# Patient Record
Sex: Female | Born: 1960 | Race: Black or African American | Hispanic: No | Marital: Single | Smoking: Former smoker
Health system: Southern US, Community
[De-identification: ages and names within clinical notes are randomized; demographics above are authoritative.]

## PROBLEM LIST (undated history)

## (undated) DIAGNOSIS — J3089 Other allergic rhinitis: Secondary | ICD-10-CM

## (undated) DIAGNOSIS — Z8619 Personal history of other infectious and parasitic diseases: Secondary | ICD-10-CM

## (undated) DIAGNOSIS — E119 Type 2 diabetes mellitus without complications: Secondary | ICD-10-CM

## (undated) DIAGNOSIS — B182 Chronic viral hepatitis C: Secondary | ICD-10-CM

## (undated) DIAGNOSIS — E785 Hyperlipidemia, unspecified: Secondary | ICD-10-CM

## (undated) DIAGNOSIS — I152 Hypertension secondary to endocrine disorders: Secondary | ICD-10-CM

## (undated) DIAGNOSIS — M1990 Unspecified osteoarthritis, unspecified site: Secondary | ICD-10-CM

## (undated) DIAGNOSIS — Z973 Presence of spectacles and contact lenses: Secondary | ICD-10-CM

## (undated) DIAGNOSIS — N185 Chronic kidney disease, stage 5: Secondary | ICD-10-CM

## (undated) DIAGNOSIS — N189 Chronic kidney disease, unspecified: Secondary | ICD-10-CM

## (undated) DIAGNOSIS — I10 Essential (primary) hypertension: Secondary | ICD-10-CM

## (undated) DIAGNOSIS — K219 Gastro-esophageal reflux disease without esophagitis: Secondary | ICD-10-CM

## (undated) DIAGNOSIS — F209 Schizophrenia, unspecified: Secondary | ICD-10-CM

## (undated) DIAGNOSIS — K769 Liver disease, unspecified: Secondary | ICD-10-CM

## (undated) DIAGNOSIS — D631 Anemia in chronic kidney disease: Secondary | ICD-10-CM

## (undated) DIAGNOSIS — E1169 Type 2 diabetes mellitus with other specified complication: Secondary | ICD-10-CM

## (undated) DIAGNOSIS — M21931 Unspecified acquired deformity of right forearm: Secondary | ICD-10-CM

## (undated) DIAGNOSIS — E1159 Type 2 diabetes mellitus with other circulatory complications: Secondary | ICD-10-CM

## (undated) DIAGNOSIS — H02422 Myogenic ptosis of left eyelid: Secondary | ICD-10-CM

## (undated) DIAGNOSIS — E1121 Type 2 diabetes mellitus with diabetic nephropathy: Secondary | ICD-10-CM

## (undated) HISTORY — DX: Essential (primary) hypertension: I10

## (undated) HISTORY — DX: Type 2 diabetes mellitus with other circulatory complications: I15.2

## (undated) HISTORY — DX: Type 2 diabetes mellitus with other specified complication: E78.5

## (undated) HISTORY — DX: Liver disease, unspecified: K76.9

## (undated) HISTORY — DX: Type 2 diabetes mellitus with other specified complication: E11.69

## (undated) HISTORY — DX: Chronic kidney disease, unspecified: N18.9

## (undated) HISTORY — PX: TONSILLECTOMY: SUR1361

## (undated) HISTORY — PX: CATARACT EXTRACTION W/ INTRAOCULAR LENS  IMPLANT, BILATERAL: SHX1307

## (undated) HISTORY — PX: ECTOPIC PREGNANCY SURGERY: SHX613

## (undated) HISTORY — PX: COLONOSCOPY: SHX174

## (undated) HISTORY — PX: TOTAL HIP ARTHROPLASTY: SHX124

## (undated) HISTORY — PX: JOINT REPLACEMENT: SHX530

## (undated) HISTORY — DX: Chronic viral hepatitis C: B18.2

## (undated) HISTORY — DX: Type 2 diabetes mellitus with other circulatory complications: E11.59

---

## 1997-10-15 ENCOUNTER — Inpatient Hospital Stay (HOSPITAL_COMMUNITY): Admission: AD | Admit: 1997-10-15 | Discharge: 1997-10-17 | Payer: Self-pay | Admitting: *Deleted

## 1997-12-15 ENCOUNTER — Inpatient Hospital Stay (HOSPITAL_COMMUNITY): Admission: AD | Admit: 1997-12-15 | Discharge: 1997-12-15 | Payer: Self-pay | Admitting: Obstetrics and Gynecology

## 1999-01-24 ENCOUNTER — Emergency Department (HOSPITAL_COMMUNITY): Admission: EM | Admit: 1999-01-24 | Discharge: 1999-01-24 | Payer: Self-pay | Admitting: Emergency Medicine

## 1999-04-05 ENCOUNTER — Emergency Department (HOSPITAL_COMMUNITY): Admission: EM | Admit: 1999-04-05 | Discharge: 1999-04-05 | Payer: Self-pay | Admitting: Emergency Medicine

## 2000-12-04 ENCOUNTER — Emergency Department (HOSPITAL_COMMUNITY): Admission: EM | Admit: 2000-12-04 | Discharge: 2000-12-05 | Payer: Self-pay

## 2000-12-04 ENCOUNTER — Encounter: Payer: Self-pay | Admitting: Emergency Medicine

## 2003-08-20 DIAGNOSIS — E119 Type 2 diabetes mellitus without complications: Secondary | ICD-10-CM

## 2003-08-20 HISTORY — DX: Type 2 diabetes mellitus without complications: E11.9

## 2008-08-21 ENCOUNTER — Emergency Department (HOSPITAL_COMMUNITY): Admission: EM | Admit: 2008-08-21 | Discharge: 2008-08-21 | Payer: Self-pay | Admitting: Emergency Medicine

## 2009-08-19 HISTORY — PX: TOTAL HIP ARTHROPLASTY: SHX124

## 2010-12-03 LAB — CBC
HCT: 41.3 % (ref 36.0–46.0)
Hemoglobin: 13.9 g/dL (ref 12.0–15.0)
MCHC: 33.7 g/dL (ref 30.0–36.0)
MCV: 94.1 fL (ref 78.0–100.0)
Platelets: 213 10*3/uL (ref 150–400)
RBC: 4.39 MIL/uL (ref 3.87–5.11)
RDW: 11.7 % (ref 11.5–15.5)
WBC: 10.6 10*3/uL — ABNORMAL HIGH (ref 4.0–10.5)

## 2010-12-03 LAB — POCT I-STAT, CHEM 8
BUN: 20 mg/dL (ref 6–23)
Calcium, Ion: 1.19 mmol/L (ref 1.12–1.32)
Chloride: 95 mEq/L — ABNORMAL LOW (ref 96–112)
Creatinine, Ser: 1.1 mg/dL (ref 0.4–1.2)
Glucose, Bld: 305 mg/dL — ABNORMAL HIGH (ref 70–99)
HCT: 44 % (ref 36.0–46.0)
Hemoglobin: 15 g/dL (ref 12.0–15.0)
Potassium: 4.1 mEq/L (ref 3.5–5.1)
Sodium: 135 mEq/L (ref 135–145)
TCO2: 30 mmol/L (ref 0–100)

## 2010-12-03 LAB — GLUCOSE, CAPILLARY: Glucose-Capillary: 314 mg/dL — ABNORMAL HIGH (ref 70–99)

## 2010-12-03 LAB — DIFFERENTIAL
Basophils Absolute: 0.2 10*3/uL — ABNORMAL HIGH (ref 0.0–0.1)
Basophils Relative: 2 % — ABNORMAL HIGH (ref 0–1)
Eosinophils Absolute: 0 10*3/uL (ref 0.0–0.7)
Eosinophils Relative: 0 % (ref 0–5)
Lymphocytes Relative: 36 % (ref 12–46)
Lymphs Abs: 3.8 10*3/uL (ref 0.7–4.0)
Monocytes Absolute: 0.9 10*3/uL (ref 0.1–1.0)
Monocytes Relative: 9 % (ref 3–12)
Neutro Abs: 5.6 10*3/uL (ref 1.7–7.7)
Neutrophils Relative %: 53 % (ref 43–77)

## 2013-08-16 ENCOUNTER — Encounter (HOSPITAL_COMMUNITY): Payer: Self-pay | Admitting: Emergency Medicine

## 2013-08-16 ENCOUNTER — Emergency Department (HOSPITAL_COMMUNITY)
Admission: EM | Admit: 2013-08-16 | Discharge: 2013-08-16 | Disposition: A | Discharge status: Home / Self Care | Payer: Medicaid - Out of State | Attending: Emergency Medicine | Admitting: Emergency Medicine

## 2013-08-16 DIAGNOSIS — Z794 Long term (current) use of insulin: Secondary | ICD-10-CM | POA: Insufficient documentation

## 2013-08-16 DIAGNOSIS — Z87891 Personal history of nicotine dependence: Secondary | ICD-10-CM | POA: Insufficient documentation

## 2013-08-16 DIAGNOSIS — E119 Type 2 diabetes mellitus without complications: Secondary | ICD-10-CM | POA: Insufficient documentation

## 2013-08-16 DIAGNOSIS — Z7982 Long term (current) use of aspirin: Secondary | ICD-10-CM | POA: Insufficient documentation

## 2013-08-16 DIAGNOSIS — Z79899 Other long term (current) drug therapy: Secondary | ICD-10-CM | POA: Insufficient documentation

## 2013-08-16 DIAGNOSIS — N750 Cyst of Bartholin's gland: Secondary | ICD-10-CM

## 2013-08-16 HISTORY — DX: Type 2 diabetes mellitus without complications: E11.9

## 2013-08-16 MED ORDER — LIDOCAINE HCL 2 % EX GEL
CUTANEOUS | Status: AC
  Administered 2013-08-16: 18:00:00
  Filled 2013-08-16: qty 10

## 2013-08-16 NOTE — ED Provider Notes (Addendum)
CSN: [id number]     Arrival date & time 08/16/13  1324 History   First MD Initiated Contact with Patient 08/16/13 1623     Chief Complaint  Patient presents with  . Vaginal Pain   (Consider location/radiation/quality/duration/timing/severity/associated sxs/prior Treatment) Patient is a 52 y.o. female presenting with vaginal pain. The history is provided by the patient. No language interpreter was used.  Vaginal Pain The current episode started in the past 7 days. Pertinent negatives include no abdominal pain, chills, fever, nausea, rash, swollen glands or urinary symptoms.    Past Medical History  Diagnosis Date  . Diabetes mellitus without complication    Past Surgical History  Procedure Laterality Date  . Tonsillectomy    . Joint replacement Left     Hip   History reviewed. No pertinent family history. History  Substance Use Topics  . Smoking status: Former Research scientist (life sciences)  . Smokeless tobacco: Never Used  . Alcohol Use: No   OB History   Grav Para Term Preterm Abortions TAB SAB Ect Mult Living                 Review of Systems  Constitutional: Negative for fever and chills.  Gastrointestinal: Negative for nausea and abdominal pain.  Genitourinary: Positive for vaginal pain. Negative for dysuria, urgency, vaginal bleeding and vaginal discharge.  Skin: Negative for rash.  All other systems reviewed and are negative.    Allergies  Other  Home Medications   Current Outpatient Rx  Name  Route  Sig  Dispense  Refill  . aspirin EC 81 MG tablet   Oral   Take 81 mg by mouth daily.         . cloNIDine (CATAPRES) 0.2 MG tablet   Oral   Take 0.2 mg by mouth daily.         Marland Kitchen gabapentin (NEURONTIN) 300 MG capsule   Oral   Take 300 mg by mouth 3 (three) times daily.         . insulin aspart protamine- aspart (NOVOLOG MIX 70/30) (70-30) 100 UNIT/ML injection   Subcutaneous   Inject 50 Units into the skin daily with breakfast.         . insulin glargine (LANTUS)  100 UNIT/ML injection   Subcutaneous   Inject 55 Units into the skin at bedtime.         . metFORMIN (GLUCOPHAGE) 1000 MG tablet   Oral   Take 1,000 mg by mouth 2 (two) times daily with a meal.         . metoprolol succinate (TOPROL-XL) 100 MG 24 hr tablet   Oral   Take 100 mg by mouth daily. Take with or immediately following a meal.         . omeprazole (PRILOSEC) 20 MG capsule   Oral   Take 20 mg by mouth daily.         . simvastatin (ZOCOR) 40 MG tablet   Oral   Take 40 mg by mouth daily.         . valsartan (DIOVAN) 160 MG tablet   Oral   Take 160 mg by mouth daily.         . ziprasidone (GEODON) 40 MG capsule   Oral   Take 40 mg by mouth 2 (two) times daily with a meal.          BP 136/71  Pulse 94  Temp(Src) 97.7 F (36.5 C) (Oral)  Resp 17  SpO2 96% Physical Exam  Nursing note and vitals reviewed. Constitutional: She is oriented to person, place, and time. She appears well-developed and well-nourished.  HENT:  Head: Normocephalic and atraumatic.  Eyes: Conjunctivae and EOM are normal.  Neck: Normal range of motion. Neck supple. No JVD present. No tracheal deviation present. No thyromegaly present.  Cardiovascular: Regular rhythm, normal heart sounds and intact distal pulses.   Pulmonary/Chest: Effort normal and breath sounds normal. No respiratory distress. She has no wheezes.  Abdominal: Soft. Bowel sounds are normal. She exhibits no distension. There is no tenderness.  Genitourinary:    There is no rash, tenderness, lesion or injury on the right labia. There is no rash, tenderness, lesion or injury on the left labia. There is tenderness around the vagina.  1-2cm cyst, with clear fluid in vaginal opening. Tender to palpation.   Musculoskeletal: Normal range of motion.  Lymphadenopathy:    She has no cervical adenopathy.  Neurological: She is alert and oriented to person, place, and time.  Skin: Skin is warm and dry.  Psychiatric: She has a  normal mood and affect. Her behavior is normal. Judgment and thought content normal.    ED Course  Apiration of blood/fluid Date/Time: 08/16/2013 5:33 PM Performed by: Elisha Headland Authorized by: Elisha Headland Consent: Verbal consent obtained. Risks and benefits: risks, benefits and alternatives were discussed Consent given by: patient Patient understanding: patient states understanding of the procedure being performed Site marked: the operative site was marked Required items: required blood products, implants, devices, and special equipment available Patient identity confirmed: verbally with patient and arm band Time out: Immediately prior to procedure a "time out" was called to verify the correct patient, procedure, equipment, support staff and site/side marked as required. Preparation: Patient was prepped and draped in the usual sterile fashion. Local anesthesia used: topical lidocaine jelly. Patient sedated: no Patient tolerance: Patient tolerated the procedure well with no immediate complications. Comments: Bartholin's cyst aspiration. #20 gauge needle and 10cc syringe used. 6cc of serous fluid. No complications.    (including critical care time) Labs Review Labs Reviewed - No data to display Imaging Review No results found.  EKG Interpretation   None       MDM   1. Bartholin's cyst    Bartholin's cyst aspirated without complication or difficulty, 6 ml of serous fluid. No signs of abscess or infection. Pt understands plan of care and wil follow-up with her Ob/Gyn.       Elisha Headland, NP 08/23/13 Jennings, NP 08/28/13 1436

## 2013-08-16 NOTE — ED Notes (Signed)
Pt c/o painful "bump" on inside of R labia.  Pain score 8/10.  Denies discharge.  Hx of diabetes.

## 2013-08-16 NOTE — ED Provider Notes (Signed)
Medical screening examination/treatment/procedure(s) were conducted as a shared visit with non-physician practitioner(s) and myself.  I personally evaluated the patient during the encounter.   Appears consistent with a bartholin gland cyst.  Does not appear to be infected or inflamed.  Appearance is not concerning for malignancy and the rapidity of onset argues against that.  Will needle decompress for comfort recommend follow up with a gynecologist for follow up.   Kathalene Frames, MD 08/16/13 [phone]

## 2014-03-31 DIAGNOSIS — I10 Essential (primary) hypertension: Secondary | ICD-10-CM | POA: Insufficient documentation

## 2014-03-31 DIAGNOSIS — E119 Type 2 diabetes mellitus without complications: Secondary | ICD-10-CM | POA: Insufficient documentation

## 2014-07-30 ENCOUNTER — Emergency Department (HOSPITAL_COMMUNITY): Payer: Medicaid - Out of State

## 2014-07-30 ENCOUNTER — Encounter (HOSPITAL_COMMUNITY): Payer: Self-pay | Admitting: *Deleted

## 2014-07-30 ENCOUNTER — Emergency Department (HOSPITAL_COMMUNITY)
Admission: EM | Admit: 2014-07-30 | Discharge: 2014-07-31 | Disposition: A | Discharge status: Home / Self Care | Payer: Medicaid - Out of State | Attending: Emergency Medicine | Admitting: Emergency Medicine

## 2014-07-30 DIAGNOSIS — Z79899 Other long term (current) drug therapy: Secondary | ICD-10-CM | POA: Insufficient documentation

## 2014-07-30 DIAGNOSIS — R05 Cough: Secondary | ICD-10-CM | POA: Insufficient documentation

## 2014-07-30 DIAGNOSIS — E119 Type 2 diabetes mellitus without complications: Secondary | ICD-10-CM | POA: Diagnosis not present

## 2014-07-30 DIAGNOSIS — R112 Nausea with vomiting, unspecified: Secondary | ICD-10-CM | POA: Insufficient documentation

## 2014-07-30 DIAGNOSIS — Z794 Long term (current) use of insulin: Secondary | ICD-10-CM | POA: Insufficient documentation

## 2014-07-30 DIAGNOSIS — Z87891 Personal history of nicotine dependence: Secondary | ICD-10-CM | POA: Diagnosis not present

## 2014-07-30 DIAGNOSIS — Z7982 Long term (current) use of aspirin: Secondary | ICD-10-CM | POA: Insufficient documentation

## 2014-07-30 DIAGNOSIS — R059 Cough, unspecified: Secondary | ICD-10-CM

## 2014-07-30 LAB — COMPREHENSIVE METABOLIC PANEL
ALT: 75 U/L — ABNORMAL HIGH (ref 0–35)
AST: 62 U/L — ABNORMAL HIGH (ref 0–37)
Albumin: 3.2 g/dL — ABNORMAL LOW (ref 3.5–5.2)
Alkaline Phosphatase: 184 U/L — ABNORMAL HIGH (ref 39–117)
Anion gap: 20 — ABNORMAL HIGH (ref 5–15)
BUN: 24 mg/dL — ABNORMAL HIGH (ref 6–23)
CO2: 21 mEq/L (ref 19–32)
Calcium: 9.6 mg/dL (ref 8.4–10.5)
Chloride: 86 mEq/L — ABNORMAL LOW (ref 96–112)
Creatinine, Ser: 1.33 mg/dL — ABNORMAL HIGH (ref 0.50–1.10)
GFR calc Af Amer: 51 mL/min — ABNORMAL LOW (ref >90)
GFR calc non Af Amer: 44 mL/min — ABNORMAL LOW (ref >90)
Glucose, Bld: 598 mg/dL (ref 70–99)
Potassium: 5.1 mEq/L (ref 3.7–5.3)
Sodium: 127 mEq/L — ABNORMAL LOW (ref 137–147)
Total Bilirubin: 0.9 mg/dL (ref 0.3–1.2)
Total Protein: 8 g/dL (ref 6.0–8.3)

## 2014-07-30 LAB — CBC WITH DIFFERENTIAL/PLATELET
Basophils Absolute: 0 10*3/uL (ref 0.0–0.1)
Basophils Relative: 0 % (ref 0–1)
Eosinophils Absolute: 0 10*3/uL (ref 0.0–0.7)
Eosinophils Relative: 0 % (ref 0–5)
HCT: 44.4 % (ref 36.0–46.0)
Hemoglobin: 16 g/dL — ABNORMAL HIGH (ref 12.0–15.0)
Lymphocytes Relative: 22 % (ref 12–46)
Lymphs Abs: 2.3 10*3/uL (ref 0.7–4.0)
MCH: 32.9 pg (ref 26.0–34.0)
MCHC: 36 g/dL (ref 30.0–36.0)
MCV: 91.2 fL (ref 78.0–100.0)
Monocytes Absolute: 0.3 10*3/uL (ref 0.1–1.0)
Monocytes Relative: 3 % (ref 3–12)
Neutro Abs: 7.8 10*3/uL — ABNORMAL HIGH (ref 1.7–7.7)
Neutrophils Relative %: 75 % (ref 43–77)
Platelets: 192 10*3/uL (ref 150–400)
RBC: 4.87 MIL/uL (ref 3.87–5.11)
RDW: 13.1 % (ref 11.5–15.5)
WBC: 10.4 10*3/uL (ref 4.0–10.5)

## 2014-07-30 LAB — URINALYSIS, ROUTINE W REFLEX MICROSCOPIC
Bilirubin Urine: NEGATIVE
Glucose, UA: >1000 mg/dL — AB
Ketones, ur: NEGATIVE mg/dL
Leukocytes, UA: NEGATIVE
Nitrite: NEGATIVE
Protein, ur: 100 mg/dL — AB
Specific Gravity, Urine: 1.03 (ref 1.005–1.030)
Urobilinogen, UA: 1 mg/dL (ref 0.0–1.0)
pH: 5 (ref 5.0–8.0)

## 2014-07-30 LAB — CBG MONITORING, ED
Glucose-Capillary: 337 mg/dL — ABNORMAL HIGH (ref 70–99)
Glucose-Capillary: 431 mg/dL — ABNORMAL HIGH (ref 70–99)
Glucose-Capillary: 433 mg/dL — ABNORMAL HIGH (ref 70–99)

## 2014-07-30 LAB — URINE MICROSCOPIC-ADD ON

## 2014-07-30 MED ORDER — SODIUM CHLORIDE 0.9 % IV SOLN
INTRAVENOUS | Status: DC
  Filled 2014-07-30: qty 2.5

## 2014-07-30 MED ORDER — DEXTROSE-NACL 5-0.45 % IV SOLN: INTRAVENOUS | Status: DC

## 2014-07-30 MED ORDER — INSULIN ASPART 100 UNIT/ML ~~LOC~~ SOLN
4.0000 [IU] | Freq: Once | SUBCUTANEOUS | Status: AC
  Administered 2014-07-31: 4 [IU] via INTRAVENOUS
  Filled 2014-07-30: qty 1

## 2014-07-30 MED ORDER — INSULIN ASPART 100 UNIT/ML ~~LOC~~ SOLN
4.0000 [IU] | Freq: Once | SUBCUTANEOUS | Status: AC
  Administered 2014-07-30: 4 [IU] via INTRAVENOUS
  Filled 2014-07-30: qty 1

## 2014-07-30 MED ORDER — SODIUM CHLORIDE 0.9 % IV BOLUS (SEPSIS)
1000.0000 mL | Freq: Once | INTRAVENOUS | Status: AC
  Administered 2014-07-30: 1000 mL via INTRAVENOUS

## 2014-07-30 MED ORDER — SODIUM CHLORIDE 0.9 % IV BOLUS (SEPSIS)
500.0000 mL | Freq: Once | INTRAVENOUS | Status: AC
Start: 2014-07-30 — End: 2014-07-31
  Administered 2014-07-30: 500 mL via INTRAVENOUS

## 2014-07-30 MED ORDER — PROMETHAZINE HCL 25 MG PO TABS: 25.0000 mg | ORAL_TABLET | Freq: Three times a day (TID) | ORAL | Status: DC | PRN

## 2014-07-30 MED ORDER — ONDANSETRON 4 MG PO TBDP
4.0000 mg | ORAL_TABLET | Freq: Once | ORAL | Status: AC
  Administered 2014-07-30: 4 mg via ORAL
  Filled 2014-07-30: qty 1

## 2014-07-30 MED ORDER — INSULIN ASPART 100 UNIT/ML ~~LOC~~ SOLN
6.0000 [IU] | Freq: Once | SUBCUTANEOUS | Status: AC
  Administered 2014-07-30: 6 [IU] via INTRAVENOUS
  Filled 2014-07-30: qty 1

## 2014-07-30 MED ORDER — ONDANSETRON HCL 4 MG/2ML IJ SOLN
4.0000 mg | Freq: Once | INTRAMUSCULAR | Status: AC
  Administered 2014-07-30: 4 mg via INTRAVENOUS
  Filled 2014-07-30: qty 2

## 2014-07-30 MED ORDER — PROMETHAZINE HCL 25 MG/ML IJ SOLN
12.5000 mg | Freq: Once | INTRAMUSCULAR | Status: AC
  Administered 2014-07-31: 12.5 mg via INTRAVENOUS
  Filled 2014-07-30 (×2): qty 1

## 2014-07-30 NOTE — ED Provider Notes (Signed)
CSN: [id number]     Arrival date & time 07/30/14  1413 History   First MD Initiated Contact with Patient 07/30/14 1601     Chief Complaint  Patient presents with  . Emesis     (Consider location/radiation/quality/duration/timing/severity/associated sxs/prior Treatment) HPI Patient presents to the emergency department with several episodes of vomiting since this morning.  The patient also states she says nausea over the last 2 days.  Patient states that she has had some muscle soreness in her abdomen since the vomiting started.  Patient states that she did not take any home for her symptoms. Patient states that she has had this happen in the past and she has had fluids at the emergency room and felt better.  The patient states that she is from [place] and has been to the hospital several times there for similar symptoms.  Patient, states she has not had any chest pain, diarrhea, weakness, headache, blurred vision, back pain, neck pain, dysuria, hematemesis, bloody stool, rash, near syncope, lightheadedness, cough, runny nose, sore throat, earache, or syncope.  Patient states nothing seems make her condition better or worse. Marland Kitchen  Past Medical History  Diagnosis Date  . Diabetes mellitus without complication    Past Surgical History  Procedure Laterality Date  . Tonsillectomy    . Joint replacement Left     Hip   No family history on file. History  Substance Use Topics  . Smoking status: Former Research scientist (life sciences)  . Smokeless tobacco: Never Used  . Alcohol Use: No   OB History    No data available     Review of Systems All other systems negative except as documented in the HPI. All pertinent positives and negatives as reviewed in the HPI.  Allergies  Other  Home Medications   Prior to Admission medications   Medication Sig Start Date End Date Taking? Authorizing Provider  aspirin EC 81 MG tablet Take 81 mg by mouth daily.   Yes Historical Provider, MD  cloNIDine (CATAPRES) 0.2 MG tablet  Take 0.2 mg by mouth 2 (two) times daily.    Yes Historical Provider, MD  gabapentin (NEURONTIN) 300 MG capsule Take 300 mg by mouth 3 (three) times daily.   Yes Historical Provider, MD  insulin aspart protamine- aspart (NOVOLOG MIX 70/30) (70-30) 100 UNIT/ML injection Inject 55 Units into the skin daily with breakfast.    Yes Historical Provider, MD  insulin glargine (LANTUS) 100 UNIT/ML injection Inject 55 Units into the skin 2 (two) times daily.    Yes Historical Provider, MD  metoprolol succinate (TOPROL-XL) 100 MG 24 hr tablet Take 100 mg by mouth daily. Take with or immediately following a meal.   Yes Historical Provider, MD  ranitidine (ZANTAC) 150 MG tablet Take 150 mg by mouth 2 (two) times daily. 07/26/14  Yes Historical Provider, MD  simvastatin (ZOCOR) 40 MG tablet Take 40 mg by mouth daily.   Yes Historical Provider, MD  valsartan (DIOVAN) 160 MG tablet Take 160 mg by mouth daily.   Yes Historical Provider, MD   BP 159/77 mmHg  Pulse 81  Temp(Src) 98.4 F (36.9 C) (Oral)  Resp 19  SpO2 98% Physical Exam  Constitutional: She is oriented to person, place, and time. She appears well-developed and well-nourished. No distress.  HENT:  Head: Normocephalic and atraumatic.  Mouth/Throat: Oropharynx is clear and moist.  Eyes: Pupils are equal, round, and reactive to light.  Neck: Normal range of motion. Neck supple.  Cardiovascular: Normal rate, regular rhythm and  normal heart sounds.  Exam reveals no gallop and no friction rub.   No murmur heard. Pulmonary/Chest: Effort normal and breath sounds normal. No respiratory distress.  Abdominal: Soft. Bowel sounds are normal. She exhibits no distension. There is no tenderness. There is no rebound and no guarding.  Musculoskeletal: She exhibits no edema.  Neurological: She is alert and oriented to person, place, and time. She exhibits normal muscle tone. Coordination normal.  Skin: Skin is warm and dry. No rash noted. No erythema.  Nursing  note and vitals reviewed.   ED Course  Procedures (including critical care time) Labs Review Labs Reviewed  URINALYSIS, ROUTINE W REFLEX MICROSCOPIC - Abnormal; Notable for the following:    Glucose, UA >1000 (*)    Hgb urine dipstick SMALL (*)    Protein, ur 100 (*)    All other components within normal limits  COMPREHENSIVE METABOLIC PANEL - Abnormal; Notable for the following:    Sodium 127 (*)    Chloride 86 (*)    Glucose, Bld 598 (*)    BUN 24 (*)    Creatinine, Ser 1.33 (*)    Albumin 3.2 (*)    AST 62 (*)    ALT 75 (*)    Alkaline Phosphatase 184 (*)    GFR calc non Af Amer 44 (*)    GFR calc Af Amer 51 (*)    Anion gap 20 (*)    All other components within normal limits  CBC WITH DIFFERENTIAL - Abnormal; Notable for the following:    Hemoglobin 16.0 (*)    Neutro Abs 7.8 (*)    All other components within normal limits  URINE CULTURE  URINE MICROSCOPIC-ADD ON    Imaging Review Dg Chest 2 View  07/30/2014   CLINICAL DATA:  Cough, generalized non radiating chest pain, and congestion for 1 week.  EXAM: CHEST  2 VIEW  COMPARISON:  None.  FINDINGS: The heart size and mediastinal contours are within normal limits. Both lungs are clear. The visualized skeletal structures are unremarkable.  IMPRESSION: No active cardiopulmonary disease.   Electronically Signed   By: Lucienne Capers M.D.   On: 07/30/2014 22:14    Patient is stable here in the emergency department and tolerated oral fluids.  The nurse gave her 2 containers of apple juice and graham crackers and shot her blood sugar back up.  We gave her some morphine and little more insulin.  She is advised to go home and take her regular insulin after checking her blood sugars.  Patient agrees the plan and all questions were answered.  The patient is feeling better and would like to go home, is not in DKA at this time.  Her bicarbonate was normal.  Her blood sugars or come down well     Brent General,  PA-C 07/31/14 2057  Quintella Reichert, MD 07/31/14 [phone]

## 2014-07-30 NOTE — Discharge Instructions (Signed)
Return here as needed. Increase your fluid intake. Your chest x-rays were normal.

## 2014-07-30 NOTE — ED Notes (Signed)
Pt reports emesis x6 this am, n/v x2 days. Denies blood in emesis. C/o lower abd pain, worse when vomiting. Reports no BM in 5 days. Sts she took meds last night and they came back up today.

## 2014-07-31 LAB — URINE CULTURE: Colony Count: 35000

## 2014-07-31 LAB — CBG MONITORING, ED: GLUCOSE-CAPILLARY: 366 mg/dL — AB (ref 70–99)

## 2015-08-23 LAB — HM DIABETES EYE EXAM

## 2015-12-22 ENCOUNTER — Encounter: Payer: Self-pay | Admitting: Internal Medicine

## 2015-12-22 ENCOUNTER — Ambulatory Visit (HOSPITAL_COMMUNITY)
Admission: RE | Admit: 2015-12-22 | Discharge: 2015-12-22 | Disposition: A | Discharge status: Home / Self Care | Payer: Medicaid Other | Source: Ambulatory Visit | Attending: Internal Medicine | Admitting: Internal Medicine

## 2015-12-22 ENCOUNTER — Ambulatory Visit (INDEPENDENT_AMBULATORY_CARE_PROVIDER_SITE_OTHER): Payer: Medicaid Other | Admitting: Internal Medicine

## 2015-12-22 VITALS — BP 203/90 | HR 68 | Temp 98.6°F | Ht 61.0 in | Wt 166.9 lb

## 2015-12-22 DIAGNOSIS — I209 Angina pectoris, unspecified: Secondary | ICD-10-CM | POA: Insufficient documentation

## 2015-12-22 DIAGNOSIS — I10 Essential (primary) hypertension: Secondary | ICD-10-CM

## 2015-12-22 DIAGNOSIS — Z794 Long term (current) use of insulin: Secondary | ICD-10-CM

## 2015-12-22 DIAGNOSIS — E1165 Type 2 diabetes mellitus with hyperglycemia: Secondary | ICD-10-CM

## 2015-12-22 DIAGNOSIS — E119 Type 2 diabetes mellitus without complications: Secondary | ICD-10-CM

## 2015-12-22 DIAGNOSIS — K219 Gastro-esophageal reflux disease without esophagitis: Secondary | ICD-10-CM | POA: Diagnosis not present

## 2015-12-22 DIAGNOSIS — Z79899 Other long term (current) drug therapy: Secondary | ICD-10-CM

## 2015-12-22 DIAGNOSIS — E1159 Type 2 diabetes mellitus with other circulatory complications: Secondary | ICD-10-CM

## 2015-12-22 DIAGNOSIS — E784 Other hyperlipidemia: Secondary | ICD-10-CM

## 2015-12-22 DIAGNOSIS — E1100 Type 2 diabetes mellitus with hyperosmolarity without nonketotic hyperglycemic-hyperosmolar coma (NKHHC): Secondary | ICD-10-CM

## 2015-12-22 DIAGNOSIS — E785 Hyperlipidemia, unspecified: Secondary | ICD-10-CM

## 2015-12-22 DIAGNOSIS — E1169 Type 2 diabetes mellitus with other specified complication: Secondary | ICD-10-CM | POA: Insufficient documentation

## 2015-12-22 DIAGNOSIS — I152 Hypertension secondary to endocrine disorders: Secondary | ICD-10-CM | POA: Diagnosis not present

## 2015-12-22 DIAGNOSIS — B182 Chronic viral hepatitis C: Secondary | ICD-10-CM | POA: Insufficient documentation

## 2015-12-22 DIAGNOSIS — I208 Other forms of angina pectoris: Secondary | ICD-10-CM

## 2015-12-22 DIAGNOSIS — L299 Pruritus, unspecified: Secondary | ICD-10-CM

## 2015-12-22 DIAGNOSIS — K739 Chronic hepatitis, unspecified: Secondary | ICD-10-CM

## 2015-12-22 LAB — PROTIME-INR
INR: 1.14 (ref 0.00–1.49)
Prothrombin Time: 14.8 seconds (ref 11.6–15.2)

## 2015-12-22 MED ORDER — METFORMIN HCL 500 MG PO TABS
500.0000 mg | ORAL_TABLET | Freq: Every day | ORAL | Status: DC
Start: 2015-12-22 — End: 2015-12-28

## 2015-12-22 MED ORDER — GABAPENTIN 300 MG PO CAPS: 300.0000 mg | ORAL_CAPSULE | Freq: Three times a day (TID) | ORAL | Status: DC

## 2015-12-22 MED ORDER — HYDROCHLOROTHIAZIDE 25 MG PO TABS: 25.0000 mg | ORAL_TABLET | Freq: Every day | ORAL | Status: DC

## 2015-12-22 MED ORDER — ASPIRIN 81 MG PO TBEC: 81.0000 mg | DELAYED_RELEASE_TABLET | Freq: Every day | ORAL | Status: DC

## 2015-12-22 MED ORDER — CETIRIZINE HCL 10 MG PO TABS: ORAL_TABLET | ORAL | Status: DC

## 2015-12-22 MED ORDER — DIPHENHYDRAMINE HCL 25 MG PO CAPS: 25.0000 mg | ORAL_CAPSULE | Freq: Every evening | ORAL | Status: DC | PRN

## 2015-12-22 MED ORDER — LOSARTAN POTASSIUM 100 MG PO TABS: 100.0000 mg | ORAL_TABLET | Freq: Every day | ORAL | Status: DC

## 2015-12-22 MED ORDER — OMEPRAZOLE 20 MG PO CPDR: 20.0000 mg | DELAYED_RELEASE_CAPSULE | Freq: Every day | ORAL | Status: DC

## 2015-12-22 NOTE — Assessment & Plan Note (Addendum)
She complains of dyspepsia so I've prescribed her omeprazole today. She reportedly had a diffuse rash over her lower extremities after taking ranitidine; this sounds clinically like a fixed drug reaction so I've added it to her allergy list.

## 2015-12-22 NOTE — Assessment & Plan Note (Addendum)
Her diabetes appears to be poorly-controlled; we don't have a lot of information so I'll make conservative changes for now. She's on Lantus 20U nightly and a reported whopping Humalog 20U with meals. I've asked her continue Lantus 20U nightly and to decreased the Humalog to 10U with meals until I see her next week. I've also added Metformin 500mg  daily and we'll up-titrate this at the next visit. I've referred her to [facility] to get set-up with a meter so we can get more information.  Addendum: Her a1c was 7.2.

## 2015-12-22 NOTE — Assessment & Plan Note (Addendum)
She was reportedly treated with Harvoni at [facility] last year. 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 We'll try to get the records from [place] to ensure she is cured. I'm a little concerned she may not be cleared because she is complaining of diffuse pruritis. I've checked her CMP and INR today. Once we get the records, I'll re-consider checking another HCV RNA level.

## 2015-12-22 NOTE — Assessment & Plan Note (Signed)
Her hypertension is very poorly-controlled at 200/90 so we're going to make some changes. We've increased losartan from 50 to 100mg  daily and added thiazide 25mg  daily. I've asked her to continue clonidine 0.2mg  twice daily until I see her next week, then I'll taper her over 3 days with 0.1mg  twice daily for one day, 0.1mg  daily for two days, then stop. We'll follow up in another 2 weeks thereafter.

## 2015-12-22 NOTE — Assessment & Plan Note (Addendum)
We've checked a lipid panel today. She's not currently on a statin but certainly will warrant one given her type 2 diabetes and hypertension.  Addendum: Her LDL was slightly elevated to 116. I tried calling her but couldn't get in touch. At the next visit, I'd like to start atorvastatin 40mg  daily.

## 2015-12-22 NOTE — Patient Instructions (Addendum)
Kayla Gregory,  It was great to meet you today!  We've discussed multiple things:  1. I'm worried about your heart, so I've referred you for a stress test to see if you have any blockages in your arteries. This is very important to get this study done.  2. I recommend taking the following medications for your blood pressure: -Clonidine 0.2mg  twice daily -Losartan 100mg  daily -Hydrochlorothiazide 25mg  daily  I'll re-check your blood pressure on Thursday and taper your clonidine at that time.  3. I've added metformin 500mg  daily for your diabetes. We'll check your a1c and I'll let you know the result on Thursday.  4. We'll try to get records from Dr. Edsel Petrin at [place] about your hepatitis C to make sure you've been cured.  5. I've refilled your other medications today./  Take care, and I'll see you on Thursday! Dr. Melburn Hake

## 2015-12-22 NOTE — Progress Notes (Signed)
Patient ID: Kayla Gregory, female   DOB: [birth year], 55 y.o.   MRN: [id number] [facility] Subjective:   Patient ID: Kayla Gregory female   DOB: [birth year] 55 y.o.   MRN: [id number]  HPI: [name] Rhoda is a 55 y.o. female with treated hepatitis C, type 2 diabetes, essential hypertension, hyperlipidemia, and gastroesophageal reflux disease presenting to establish in our clinic, complaining of stable angina, and follow-up type 2 diabetes, essential hypertension, and untreated hepatitis C.  Stable angina: For the last year, she has felt chest tightness and dyspnea when walking up one flight of stairs. She has a very positive family history of myocardial infarctions and has poorly-controlled type 2 diabetes, hypertension, hyperlipidemia, and hepatitis C. She denies any chest pain at rest, orthopnea, or paroxysmal nocturnal dyspnea.  Type 2 diabetes: She tells me she is currently taking Lantus 20U nightly and Humalog 20U three times daily with meals. Her blood sugars range from 250 to 400 and she has remarkably not had any lows. She would like to speak to our diabetic educator and needs a new meter.  Essential hypertension: She has not been checking her blood pressure at home.  Treated hepatitis C: She tells me she was treated by Dr. Edsel Petrin at [facility] last year. She complains of diffuse prutitis but denies hematemesis and abdominal bloating.  I have reviewed her medications with her and she does not smoke.  Current Outpatient Prescriptions  Medication Sig Dispense Refill  . aspirin (ASPIR-81) 81 MG EC tablet Take 1 tablet (81 mg total) by mouth daily. 90 tablet 3  . cloNIDine (CATAPRES) 0.2 MG tablet Take 0.2 mg by mouth 2 (two) times daily.    . diphenhydrAMINE (BENADRYL) 25 mg capsule Take 1 capsule (25 mg total) by mouth at bedtime as needed for itching. 90 capsule 3  . insulin glargine (LANTUS) 100 UNIT/ML injection Inject 20 Units into the skin.    Marland Kitchen  insulin lispro (HUMALOG) 100 UNIT/ML injection Inject 10 Units into the skin.    Marland Kitchen losartan (COZAAR) 100 MG tablet Take 1 tablet (100 mg total) by mouth daily. 90 tablet 3  . cetirizine (ZYRTEC) 10 MG tablet Take 1 tablet (10 mg total) by mouth daily as needed for itching. 90 tablet 3  . FLUoxetine (PROZAC) 20 MG capsule Take 20 mg by mouth daily.    Marland Kitchen gabapentin (NEURONTIN) 300 MG capsule Take 1 capsule (300 mg total) by mouth 3 (three) times daily. 270 capsule 3  . hydrochlorothiazide (HYDRODIURIL) 25 MG tablet Take 1 tablet (25 mg total) by mouth daily. 90 tablet 3  . metFORMIN (GLUCOPHAGE) 500 MG tablet Take 1 tablet (500 mg total) by mouth daily with breakfast. 270 tablet 3  . omeprazole (PRILOSEC) 20 MG capsule Take 1 capsule (20 mg total) by mouth daily. 30 capsule 1  . ziprasidone (GEODON) 20 MG capsule Take 20 mg by mouth daily.     No current facility-administered medications for this visit.   Family History  Problem Relation Age of Onset  . Heart attack Sister 25  . Heart attack Brother 42  . Heart attack Mother 33   Social History   Social History  . Marital Status: Single    Spouse Name: N/A  . Number of Children: N/A  . Years of Education: N/A   Social History Main Topics  . Smoking status: Former Research scientist (life sciences)  . Smokeless tobacco: Never Used  . Alcohol Use: No  . Drug Use: No  Comment: past crack and marijuana  . Sexual Activity: Not Asked   Other Topics Concern  . None   Social History Narrative   She recently moved from [place] to [place] in 2017. 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   Review of Systems  Constitutional: Negative for fever, chills, weight loss and malaise/fatigue.  Eyes: Positive for blurred vision.  Respiratory: Positive for shortness of breath. Negative for cough and wheezing.   Cardiovascular: Positive for chest pain. Negative for palpitations, orthopnea, claudication and leg swelling.  Gastrointestinal:  Positive for heartburn. Negative for nausea, vomiting, abdominal pain, diarrhea, constipation and melena.  Musculoskeletal: Positive for back pain.  Skin: Positive for itching. Negative for rash.  Neurological: Negative for dizziness and headaches.  Psychiatric/Behavioral: Negative for depression.   Objective:  Physical Exam: Filed Vitals:   12/22/15 0934  BP: 203/90  Pulse: 68  Temp: 98.6 F (37 C)  TempSrc: Oral  Height: 5\' 1"  (1.549 m)  Weight: 166 lb 14.4 oz (75.705 kg)  SpO2: 98%   General: friendly lady resting in chair comfortably, appropriately conversational HEENT: no scleral icterus, extra-ocular muscles intact, oropharynx without lesions Cardiac: regular rate and rhythm, no rubs, murmurs or gallops, no JVD Pulm: breathing well, clear to auscultation bilaterally Abd: bowel sounds normal, soft, nondistended, non-tender Ext: warm and well perfused, without pedal edema Lymph: no cervical or supraclavicular lymphadenopathy Skin: countless hyperpigmented macules on bilateral arms and lower legs from scratching without primary lesions Neuro: alert and oriented X3, cranial nerves II-XII grossly intact, moving all extremities well  Assessment & Plan:  Case discussed with Dr. Lynnae January  Stable angina [facility]) She complains of stable angina with numerous atherosclerotic risk factors. I'm quite concerned about coronary artery disease; fortunately, her EKG today did not show ischemic changes. I've referred her for a Lexiscan, started aspirin 81mg  daily, and we'll work on controlling her hypertension, diabetes, and hyperlipidemia per those assessments and plans.  Type 2 diabetes mellitus (HCC) Her diabetes appears to be poorly-controlled; we don't have a lot of information so I'll make conservative changes for now. She's on Lantus 20U nightly and a reported whopping Humalog 20U with meals. I've asked her to decreased the Humalog to 10U with meals until I see her next week. I've also added  Metformin 500mg  daily and we'll up-titrate this at the next visit. I've referred her to [facility] to get set-up with a meter so we can get more information.  Hyperlipidemia associated with type 2 diabetes mellitus ([place]) We've checked a lipid panel today. She's not currently on a statin but certainly will warrant one given her type 2 diabetes and hypertension. I'll probably start with high-intensity.  GERD (gastroesophageal reflux disease) She complains of dyspepsia so I've prescribed her omeprazole today. She reportedly had a diffuse rash over her lower extremities after taking ranitidine; this sounds clinically like a fixed drug reaction so I've added it to her allergy list.  Hepatitis, chronic ([place]) She was reportedly treated with Harvoni at [facility] last year. 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 We'll try to get the records from [place] to ensure she is cured. I'm a little concerned she may not be cleared because she is complaining of diffuse pruritis. I've checked her CMP and INR today. Once we get the records, I'll re-consider checking another HCV RNA level.  Hypertension associated with diabetes ([place]) Her hypertension is very poorly-controlled at 200/90  so we're going to make some changes. We've increased losartan from 50 to 100mg  daily and added thiazide 25mg  daily. I've asked her to continue clonidine 0.2mg  twice daily until I see her next week, then I'll taper her over 3 days with 0.1mg  twice daily for one day, 0.1mg  daily for two days, then stop. We'll follow up in another 2 weeks thereafter.   Medications Ordered Meds ordered this encounter  Medications  . DISCONTD: diphenhydrAMINE (BENADRYL) 25 mg capsule    Sig: Take 25 mg by mouth.  Marland Kitchen FLUoxetine (PROZAC) 20 MG capsule    Sig: Take 20 mg by mouth daily.  . insulin glargine (LANTUS) 100 UNIT/ML injection    Sig: Inject 20 Units into the skin.  Marland Kitchen insulin lispro (HUMALOG)  100 UNIT/ML injection    Sig: Inject 10 Units into the skin.  Marland Kitchen DISCONTD: losartan (COZAAR) 50 MG tablet    Sig: Take 50 mg by mouth daily.  . ziprasidone (GEODON) 20 MG capsule    Sig: Take 20 mg by mouth daily.  Marland Kitchen DISCONTD: aspirin (ASPIR-81) 81 MG EC tablet    Sig: Take 81 mg by mouth daily.  . cloNIDine (CATAPRES) 0.2 MG tablet    Sig: Take 0.2 mg by mouth 2 (two) times daily.  Marland Kitchen gabapentin (NEURONTIN) 300 MG capsule    Sig: Take 1 capsule (300 mg total) by mouth 3 (three) times daily.    Dispense:  270 capsule    Refill:  3  . hydrochlorothiazide (HYDRODIURIL) 25 MG tablet    Sig: Take 1 tablet (25 mg total) by mouth daily.    Dispense:  90 tablet    Refill:  3  . omeprazole (PRILOSEC) 20 MG capsule    Sig: Take 1 capsule (20 mg total) by mouth daily.    Dispense:  30 capsule    Refill:  1  . losartan (COZAAR) 100 MG tablet    Sig: Take 1 tablet (100 mg total) by mouth daily.    Dispense:  90 tablet    Refill:  3  . aspirin (ASPIR-81) 81 MG EC tablet    Sig: Take 1 tablet (81 mg total) by mouth daily.    Dispense:  90 tablet    Refill:  3  . diphenhydrAMINE (BENADRYL) 25 mg capsule    Sig: Take 1 capsule (25 mg total) by mouth at bedtime as needed for itching.    Dispense:  90 capsule    Refill:  3  . cetirizine (ZYRTEC) 10 MG tablet    Sig: Take 1 tablet (10 mg total) by mouth daily as needed for itching.    Dispense:  90 tablet    Refill:  3  . metFORMIN (GLUCOPHAGE) 500 MG tablet    Sig: Take 1 tablet (500 mg total) by mouth daily with breakfast.    Dispense:  270 tablet    Refill:  3   Other Orders Orders Placed This Encounter  Procedures  . Hemoglobin A1c  . CMP14 + Anion Gap  . CBC no Diff  . Protime-INR  . Lipid Profile  . Ambulatory referral to diabetic education    Referral Priority:  Routine    Referral Type:  Consultation    Referral Reason:  Specialty Services Required    Number of Visits Requested:  1  . Myocardial Perfusion Imaging     Standing Status: Future     Number of Occurrences:      Standing Expiration Date: 12/21/2016  Order Specific Question:  Where should this test be performed    Answer:  [facility]    Order Specific Question:  Type of stress    Answer:  Lexiscan    Order Specific Question:  Patient weight in lbs    Answer:  166  . EKG 12-Lead    Scheduling Instructions:     Stable angina    Order Specific Question:  Where should this test be performed    Answer:  [facility]   Follow Up: Return in about 1 week (around 12/29/2015).

## 2015-12-22 NOTE — Progress Notes (Signed)
Internal Medicine Clinic Attending  Case discussed with Dr. Flores at the time of the visit.  We reviewed the resident's history and exam and pertinent patient test results.  I agree with the assessment, diagnosis, and plan of care documented in the resident's note. 

## 2015-12-22 NOTE — Assessment & Plan Note (Signed)
She complains of stable angina with numerous atherosclerotic risk factors. I'm quite concerned about coronary artery disease; fortunately, her EKG today did not show ischemic changes. I've referred her for a Lexiscan, started aspirin 81mg  daily, and we'll work on controlling her hypertension, diabetes, and hyperlipidemia per those assessments and plans.

## 2015-12-23 LAB — CMP14 + ANION GAP
ALBUMIN: 4.1 g/dL (ref 3.5–5.5)
ALT: 44 IU/L — AB (ref 0–32)
AST: 40 IU/L (ref 0–40)
Albumin/Globulin Ratio: 1.2 (ref 1.2–2.2)
Alkaline Phosphatase: 126 IU/L — ABNORMAL HIGH (ref 39–117)
Anion Gap: 17 mmol/L (ref 10.0–18.0)
BILIRUBIN TOTAL: 0.3 mg/dL (ref 0.0–1.2)
BUN / CREAT RATIO: 19 (ref 9–23)
BUN: 19 mg/dL (ref 6–24)
CHLORIDE: 100 mmol/L (ref 96–106)
CO2: 22 mmol/L (ref 18–29)
Calcium: 9.2 mg/dL (ref 8.7–10.2)
Creatinine, Ser: 1.02 mg/dL — ABNORMAL HIGH (ref 0.57–1.00)
GFR calc Af Amer: 71 mL/min/{1.73_m2} (ref >59)
GFR calc non Af Amer: 61 mL/min/{1.73_m2} (ref >59)
GLOBULIN, TOTAL: 3.3 g/dL (ref 1.5–4.5)
Glucose: 177 mg/dL — ABNORMAL HIGH (ref 65–99)
Potassium: 4.2 mmol/L (ref 3.5–5.2)
SODIUM: 139 mmol/L (ref 134–144)
TOTAL PROTEIN: 7.4 g/dL (ref 6.0–8.5)

## 2015-12-23 LAB — CBC
HEMATOCRIT: 41.6 % (ref 34.0–46.6)
Hemoglobin: 14.3 g/dL (ref 11.1–15.9)
MCH: 31.6 pg (ref 26.6–33.0)
MCHC: 34.4 g/dL (ref 31.5–35.7)
MCV: 92 fL (ref 79–97)
Platelets: 196 10*3/uL (ref 150–379)
RBC: 4.53 x10E6/uL (ref 3.77–5.28)
RDW: 13 % (ref 12.3–15.4)
WBC: 5.4 10*3/uL (ref 3.4–10.8)

## 2015-12-23 LAB — LIPID PANEL
CHOL/HDL RATIO: 4.1 ratio (ref 0.0–4.4)
Cholesterol, Total: 175 mg/dL (ref 100–199)
HDL: 43 mg/dL (ref >39)
LDL Calculated: 116 mg/dL — ABNORMAL HIGH (ref 0–99)
Triglycerides: 81 mg/dL (ref 0–149)
VLDL CHOLESTEROL CAL: 16 mg/dL (ref 5–40)

## 2015-12-23 LAB — HEMOGLOBIN A1C
Est. average glucose Bld gHb Est-mCnc: 166 mg/dL
Hgb A1c MFr Bld: 7.4 % — ABNORMAL HIGH (ref 4.8–5.6)

## 2015-12-26 ENCOUNTER — Telehealth: Payer: Self-pay | Admitting: *Deleted

## 2015-12-26 NOTE — Addendum Note (Signed)
Addended by: Carlota Raspberry on: 12/26/2015 12:42 PM   Modules accepted: Orders

## 2015-12-26 NOTE — Telephone Encounter (Signed)
Call in for clarification on medications. Questioning whether to continue taking Benadryl with the newly ordered Zyrtec for itching. Per note, informed patient to stop the Benadryl and try the Zyrtec. Patient verbalized understanding. Has follow up scheduled for Thursday 12/28/2015.

## 2015-12-26 NOTE — Telephone Encounter (Signed)
Patient also requesting refill on test strips, no current order seen.

## 2015-12-27 ENCOUNTER — Telehealth: Payer: Self-pay | Admitting: Internal Medicine

## 2015-12-27 NOTE — Telephone Encounter (Signed)
APT. REMINDER CALL, LMTCB °

## 2015-12-28 ENCOUNTER — Encounter: Payer: Self-pay | Admitting: Internal Medicine

## 2015-12-28 ENCOUNTER — Ambulatory Visit (INDEPENDENT_AMBULATORY_CARE_PROVIDER_SITE_OTHER): Payer: Medicaid Other | Admitting: Internal Medicine

## 2015-12-28 VITALS — BP 163/76 | HR 66 | Temp 98.4°F | Wt 171.8 lb

## 2015-12-28 DIAGNOSIS — K739 Chronic hepatitis, unspecified: Secondary | ICD-10-CM

## 2015-12-28 DIAGNOSIS — Z794 Long term (current) use of insulin: Secondary | ICD-10-CM

## 2015-12-28 DIAGNOSIS — B182 Chronic viral hepatitis C: Secondary | ICD-10-CM

## 2015-12-28 DIAGNOSIS — E785 Hyperlipidemia, unspecified: Secondary | ICD-10-CM

## 2015-12-28 DIAGNOSIS — E1169 Type 2 diabetes mellitus with other specified complication: Secondary | ICD-10-CM | POA: Diagnosis not present

## 2015-12-28 DIAGNOSIS — I10 Essential (primary) hypertension: Principal | ICD-10-CM

## 2015-12-28 DIAGNOSIS — I208 Other forms of angina pectoris: Secondary | ICD-10-CM

## 2015-12-28 DIAGNOSIS — I152 Hypertension secondary to endocrine disorders: Secondary | ICD-10-CM | POA: Diagnosis not present

## 2015-12-28 DIAGNOSIS — E784 Other hyperlipidemia: Secondary | ICD-10-CM | POA: Diagnosis not present

## 2015-12-28 DIAGNOSIS — E1159 Type 2 diabetes mellitus with other circulatory complications: Secondary | ICD-10-CM | POA: Diagnosis not present

## 2015-12-28 DIAGNOSIS — E1100 Type 2 diabetes mellitus with hyperosmolarity without nonketotic hyperglycemic-hyperosmolar coma (NKHHC): Secondary | ICD-10-CM

## 2015-12-28 DIAGNOSIS — Z0000 Encounter for general adult medical examination without abnormal findings: Secondary | ICD-10-CM

## 2015-12-28 DIAGNOSIS — Z23 Encounter for immunization: Secondary | ICD-10-CM | POA: Diagnosis not present

## 2015-12-28 DIAGNOSIS — Z7982 Long term (current) use of aspirin: Secondary | ICD-10-CM | POA: Diagnosis not present

## 2015-12-28 DIAGNOSIS — Z8619 Personal history of other infectious and parasitic diseases: Secondary | ICD-10-CM | POA: Diagnosis not present

## 2015-12-28 DIAGNOSIS — Z1211 Encounter for screening for malignant neoplasm of colon: Secondary | ICD-10-CM

## 2015-12-28 LAB — GLUCOSE, CAPILLARY: GLUCOSE-CAPILLARY: 112 mg/dL — AB (ref 65–99)

## 2015-12-28 MED ORDER — AMLODIPINE BESYLATE 10 MG PO TABS
10.0000 mg | ORAL_TABLET | Freq: Every day | ORAL | Status: DC
Start: 2015-12-28 — End: 2016-02-16

## 2015-12-28 MED ORDER — METFORMIN HCL 500 MG PO TABS: 1000.0000 mg | ORAL_TABLET | Freq: Two times a day (BID) | ORAL | Status: DC

## 2015-12-28 MED ORDER — ATORVASTATIN CALCIUM 40 MG PO TABS: 40.0000 mg | ORAL_TABLET | Freq: Every day | ORAL | Status: DC

## 2015-12-28 NOTE — Assessment & Plan Note (Signed)
She continues to be hypertensive today to 163/76. I have asked her to taper her clonidine over the course of 3 days, and started amlodipine 10 mg daily instead. She will continue taking losartan 100 mg daily and HCTZ 25 mg daily. We will see her back in 2 weeks for another blood pressure recheck. If she continues to be hypertensive, I think low-dose spironolactone 12.5 mg daily would be the next best addition.

## 2015-12-28 NOTE — Assessment & Plan Note (Signed)
Her LDL was mildly elevated; given her current concomitant diabetes and hypertension, strong family history of stroke and heart attacks, and ASCVD score>20%, we have collectively decided to start atorvastatin 40 mg daily.

## 2015-12-28 NOTE — Progress Notes (Signed)
Internal Medicine Clinic Attending  Case discussed with Dr. Flores at the time of the visit.  We reviewed the resident's history and exam and pertinent patient test results.  I agree with the assessment, diagnosis, and plan of care documented in the resident's note. 

## 2015-12-28 NOTE — Assessment & Plan Note (Addendum)
Today, we'll check an HCV RNA level to ensure she has been successfully treated for her hepatitis C, she tells me she has been. I've also checked an HIV antibody.  Addendum: Fortunately she has indeed cleared hepatitis C with treatment in [place] as indicated by an undetectable viral load. Her HIV was also negative. I've mailed her the results.

## 2015-12-28 NOTE — Patient Instructions (Signed)
Ms. Halvorsen,  It was great to see you again today!  We talked about a few things:  1. Your blood pressure is still high, so I'd like to stop the clonidine and add a new medication called amlodipine. I'd like for you to taper the clonidine as such:  Friday: take half a pill twice daily Saturday: take half a pill only in the morning Sunday: take half a pill only in the morning Monday: stop taking clonidine and throw the rest away  2. For your diabetes, I recommend stopping the Novolog, take 15 units of Lantus at 10pm, and increase your metformin to two pills twice daily. I've referred you to opthalmology to have your eyes checked.  3. For your high cholesterol, we've started a medication called atorvastatin.  Take care, and we'll see you in 1 month, Dr. Melburn Hake

## 2015-12-28 NOTE — Progress Notes (Addendum)
Patient ID: Kayla Gregory, female   DOB: [birth year], 55 y.o.   MRN: [id number] [facility] Subjective:   Patient ID: Kayla Gregory female   DOB: 1961/05/05 55 y.o.   MRN: [id number]  HPI: [name] is a 56 y.o. female with insulin-dependent type 2 diabetes, hypertension, hyperlipidemia, hepatitis C, and an unspecified mood disorder here for follow-up of stable angina, type 2 diabetes and hypertension.  Stable angina: She continues to have substernal chest pressures while walking; this is relieved by rest. She denies any syncope or dyspnea. She's awaiting a call for there stress test appointment and is taking aspirin 81mg  daily.  Type 2 diabetes: She is taking Lantus 15 units nightly and NovoLog 10 units with meals. We have dropped the NovoLog from 20 units at the last visit, she continues to have hypoglycemia to the 30s after mealtime insulin. Her morning sugar this morning was 120 before she ate breakfast.  Hypertension: She has not been checking her pressures at home but has been taking her medications as prescribed.  She is not smoking and I have reviewed her medications with her today.  Review of Systems  Constitutional: Negative for fever, chills, weight loss and malaise/fatigue.  Respiratory: Negative for shortness of breath and wheezing.   Cardiovascular: Positive for chest pain. Negative for orthopnea, claudication and leg swelling.  Musculoskeletal: Negative for myalgias and joint pain.  Neurological: Negative for dizziness, focal weakness and headaches.  Psychiatric/Behavioral: Negative for depression and substance abuse.   Objective:  Physical Exam: Filed Vitals:   12/28/15 0842  BP: 163/76  Pulse: 66  Temp: 98.4 F (36.9 C)  TempSrc: Oral  Weight: 171 lb 12.8 oz (77.928 kg)  SpO2: 98%   General: resting in chair comfortably, appropriately conversational HEENT: no scleral icterus, extra-ocular muscles intact, oropharynx without  lesions Cardiac: regular rate and rhythm, no rubs, murmurs or gallops Pulm: breathing well, clear to auscultation bilaterally Abd: bowel sounds normal, soft, nondistended, non-tender Ext: warm and well perfused, trace pedal edema Lymph: no cervical or supraclavicular lymphadenopathy Skin: no rash, hair, or nail changes Neuro: alert and oriented X3, cranial nerves II-XII grossly intact, moving all extremities well  Assessment & Plan:  Case discussed with Dr. Dareen Piano  Stable angina [facility]) She continues to have stable angina, and is awaiting a call from our office for her stress test appointment. She is taking aspirin 81 mg daily.  Hypertension associated with diabetes ([place]) She continues to be hypertensive today to 163/76. I have asked her to taper her clonidine over the course of 3 days, and started amlodipine 10 mg daily instead. She will continue taking losartan 100 mg daily and HCTZ 25 mg daily. We will see her back in 2 weeks for another blood pressure recheck. If she continues to be hypertensive, I think low-dose spironolactone 12.5 mg daily would be the next best addition.   Hepatitis, chronic (HCC) Today, we'll check an HCV RNA level to ensure she has been successfully treated for her hepatitis C, she tells me she has been. I've also checked an HIV antibody.  Addendum: Fortunately she has indeed cleared hepatitis C with treatment in [place] as indicated by an undetectable viral load. Her HIV was also negative. I've mailed her the results.  Hyperlipidemia associated with type 2 diabetes mellitus ([place]) Her LDL was mildly elevated; given her current concomitant diabetes and hypertension, strong family history of stroke and heart attacks, and ASCVD score>20%, we have collectively decided to start atorvastatin  40 mg daily.  Type 2 diabetes mellitus (HCC) Her a1c last visit was mildly elevated at 7.4. She is on a strange regiment so I've made some changes. She had been hypoglycemic while  taking the prandial NovoLog, so I've asked her to stop this altogether today. She will continue on Lantus 15 units nightly. I've asked her to check her early morning sugar so we can titrate this accordingly. She'll get a meter at [employer]. Also, at the last visit, we started metformin 500 mg daily; she will uptitrate this gradually over the course of 4 weeks to 1000 mg twice daily. We will recheck another A1c in August. If she continues to be above goal, I think we should consider starting an SGLT 2 inhibitor given her high risk for coronary artery disease.  Healthcare maintenance She got her TDAP today and I referred her to opthalmology for a retinal scan given her diabetes. At the next visit, we can offer her a Pap smear appointment and referral for mammogram.   Medications Ordered Meds ordered this encounter  Medications  . metFORMIN (GLUCOPHAGE) 500 MG tablet    Sig: Take 2 tablets (1,000 mg total) by mouth 2 (two) times daily with a meal.    Dispense:  270 tablet    Refill:  3  . amLODipine (NORVASC) 10 MG tablet    Sig: Take 1 tablet (10 mg total) by mouth daily.    Dispense:  30 tablet    Refill:  11  . atorvastatin (LIPITOR) 40 MG tablet    Sig: Take 1 tablet (40 mg total) by mouth daily.    Dispense:  90 tablet    Refill:  3   Other Orders Orders Placed This Encounter  Procedures  . Tdap vaccine greater than or equal to 7yo IM  . Glucose, capillary  . HCV RNA quant  . HIV antibody (with reflex)  . Ambulatory referral to Ophthalmology    Referral Priority:  Routine    Referral Type:  Consultation    Referral Reason:  Specialty Services Required    Requested Specialty:  Ophthalmology    Number of Visits Requested:  1   Follow Up: Return in about 2 weeks (around 01/11/2016) for blood pressure re-check.

## 2015-12-28 NOTE — Assessment & Plan Note (Signed)
She continues to have stable angina, and is awaiting a call from our office for her stress test appointment. She is taking aspirin 81 mg daily.

## 2015-12-28 NOTE — Assessment & Plan Note (Signed)
Her a1c last visit was mildly elevated at 7.4. She is on a strange regiment so I've made some changes. She had been hypoglycemic while taking the prandial NovoLog, so I've asked her to stop this altogether today. She will continue on Lantus 15 units nightly. I've asked her to check her early morning sugar so we can titrate this accordingly. She'll get a meter at [employer]. Also, at the last visit, we started metformin 500 mg daily; she will uptitrate this gradually over the course of 4 weeks to 1000 mg twice daily. We will recheck another A1c in August. If she continues to be above goal, I think we should consider starting an SGLT 2 inhibitor given her high risk for coronary artery disease.

## 2015-12-28 NOTE — Assessment & Plan Note (Deleted)
Her LDL was mildly elevated; given her current concomitant diabetes and hypertension, strong family history of stroke and heart attacks, and ASCVD score>20%, we have collectively decided to start atorvastatin 40 mg daily.

## 2015-12-28 NOTE — Assessment & Plan Note (Signed)
She got her TDAP today and I referred her to opthalmology for a retinal scan given her diabetes. At the next visit, we can offer her a Pap smear appointment and referral for mammogram.

## 2015-12-29 ENCOUNTER — Emergency Department (HOSPITAL_COMMUNITY)
Admission: EM | Admit: 2015-12-29 | Discharge: 2015-12-29 | Disposition: A | Discharge status: Home / Self Care | Payer: Medicaid Other | Attending: Emergency Medicine | Admitting: Emergency Medicine

## 2015-12-29 ENCOUNTER — Encounter (HOSPITAL_COMMUNITY): Payer: Self-pay | Admitting: Emergency Medicine

## 2015-12-29 DIAGNOSIS — E1159 Type 2 diabetes mellitus with other circulatory complications: Secondary | ICD-10-CM | POA: Insufficient documentation

## 2015-12-29 DIAGNOSIS — T07XXXA Unspecified multiple injuries, initial encounter: Secondary | ICD-10-CM

## 2015-12-29 DIAGNOSIS — R220 Localized swelling, mass and lump, head: Secondary | ICD-10-CM | POA: Diagnosis not present

## 2015-12-29 DIAGNOSIS — L299 Pruritus, unspecified: Secondary | ICD-10-CM

## 2015-12-29 DIAGNOSIS — Z87891 Personal history of nicotine dependence: Secondary | ICD-10-CM | POA: Insufficient documentation

## 2015-12-29 DIAGNOSIS — L298 Other pruritus: Secondary | ICD-10-CM | POA: Diagnosis not present

## 2015-12-29 DIAGNOSIS — F424 Excoriation (skin-picking) disorder: Secondary | ICD-10-CM | POA: Diagnosis not present

## 2015-12-29 DIAGNOSIS — I10 Essential (primary) hypertension: Secondary | ICD-10-CM | POA: Insufficient documentation

## 2015-12-29 DIAGNOSIS — E1169 Type 2 diabetes mellitus with other specified complication: Secondary | ICD-10-CM | POA: Insufficient documentation

## 2015-12-29 DIAGNOSIS — Z7984 Long term (current) use of oral hypoglycemic drugs: Secondary | ICD-10-CM | POA: Diagnosis not present

## 2015-12-29 DIAGNOSIS — Z8619 Personal history of other infectious and parasitic diseases: Secondary | ICD-10-CM | POA: Insufficient documentation

## 2015-12-29 DIAGNOSIS — Z79899 Other long term (current) drug therapy: Secondary | ICD-10-CM | POA: Diagnosis not present

## 2015-12-29 DIAGNOSIS — Z794 Long term (current) use of insulin: Secondary | ICD-10-CM | POA: Diagnosis not present

## 2015-12-29 DIAGNOSIS — E785 Hyperlipidemia, unspecified: Secondary | ICD-10-CM | POA: Diagnosis not present

## 2015-12-29 DIAGNOSIS — R51 Headache: Secondary | ICD-10-CM | POA: Insufficient documentation

## 2015-12-29 DIAGNOSIS — Z7982 Long term (current) use of aspirin: Secondary | ICD-10-CM | POA: Insufficient documentation

## 2015-12-29 HISTORY — DX: Schizophrenia, unspecified: F20.9

## 2015-12-29 LAB — HIV ANTIBODY (ROUTINE TESTING W REFLEX): HIV Screen 4th Generation wRfx: NONREACTIVE

## 2015-12-29 LAB — HCV RNA QUANT: Hepatitis C Quantitation: NOT DETECTED IU/mL

## 2015-12-29 MED ORDER — CALAMINE EX LOTN: 1.0000 "application " | TOPICAL_LOTION | CUTANEOUS | Status: DC | PRN

## 2015-12-29 NOTE — ED Notes (Signed)
C/o "I have bugs inside my head". Also swelling around eyes. Hx of Schizophrenia. Pt upset that she could not get an MRI to see the bugs inside her head. I explained that they COULD NOT get inside her head. Pt has several bumps on her scalp.

## 2015-12-29 NOTE — ED Notes (Signed)
Pt refused to sign, walked out of ER

## 2015-12-29 NOTE — Discharge Instructions (Signed)
Please read and follow all provided instructions.  Your diagnoses today include:  1. Multiple excoriations   2. Scalp itch    Medications prescribed:   Take as prescribed   Home care instructions:  Follow any educational materials contained in this packet.  Follow-up instructions: Please follow-up with your primary care provider in the next week for further evaluation of symptoms and treatment   Return instructions:   Please return to the Emergency Department if you do not get better, if you get worse, or new symptoms OR  - Fever (temperature greater than 101.81F)  - Bleeding that does not stop with holding pressure to the area    -Severe pain (please note that you may be more sore the day after your accident)  - Chest Pain  - Difficulty breathing  - Severe nausea or vomiting  - Inability to tolerate food and liquids  - Passing out  - Skin becoming red around your wounds  - Change in mental status (confusion or lethargy)  - New numbness or weakness     Please return if you have any other emergent concerns.

## 2015-12-29 NOTE — ED Provider Notes (Signed)
CSN: [id number]     Arrival date & time 12/29/15  1053 History  By signing my name below, I, Rayna Sexton, attest that this documentation has been prepared under the direction and in the presence of Shary Decamp, PA-C. Electronically Signed: Rayna Sexton, ED Scribe. 12/29/2015. 11:13 AM.   Chief Complaint  Patient presents with  . Facial Swelling  . Pruritis   The history is provided by the patient. No language interpreter was used.   HPI Comments: Kayla Gregory is a 55 y.o. female who presents to the Emergency Department complaining of mild, diffuse, points of irritation across her scalp onset a few days ago. Pt states that she regularly sits in her yard beneath trees and noticed the irritation following her most recent time outside stating that she believes she was bit by insects and that they are "inside my head". She reports associated, mild, swelling around her eyes which has since alleviated after applying a warm compress as well as a mild HA and 10/10 pain surrounding the affected regions. She denies taking anything for pain management. She denies fevers, chills, neck pain, neck stiffness or any other associated symptoms at this time.  Past Medical History  Diagnosis Date  . Diabetes mellitus without complication ([place])   . Hepatitis C, chronic ([place])   . Hyperlipidemia associated with type 2 diabetes mellitus ([place])   . Hypertension associated with diabetes [facility])    Past Surgical History  Procedure Laterality Date  . Tonsillectomy    . Joint replacement Left     Hip   Family History  Problem Relation Age of Onset  . Heart attack Sister 27  . Heart attack Brother 12  . Heart attack Mother 42   Social History  Substance Use Topics  . Smoking status: Former Research scientist (life sciences)  . Smokeless tobacco: Never Used  . Alcohol Use: No   OB History    No data available     Review of Systems  Constitutional: Negative for fever and chills.  Musculoskeletal: Negative for neck pain and neck  stiffness.  Skin: Positive for color change and rash.  Neurological: Positive for headaches.   Allergies  Ranitidine hcl  Home Medications   Prior to Admission medications   Medication Sig Start Date End Date Taking? Authorizing Provider  amLODipine (NORVASC) 10 MG tablet Take 1 tablet (10 mg total) by mouth daily. 12/28/15 12/27/16  Loleta Chance, MD  aspirin (ASPIR-81) 81 MG EC tablet Take 1 tablet (81 mg total) by mouth daily. 12/22/15   Loleta Chance, MD  atorvastatin (LIPITOR) 40 MG tablet Take 1 tablet (40 mg total) by mouth daily. 12/28/15   Loleta Chance, MD  cetirizine (ZYRTEC) 10 MG tablet Take 1 tablet (10 mg total) by mouth daily as needed for itching. 12/22/15   Loleta Chance, MD  diphenhydrAMINE (BENADRYL) 25 mg capsule Take 1 capsule (25 mg total) by mouth at bedtime as needed for itching. 12/22/15   Loleta Chance, MD  FLUoxetine (PROZAC) 20 MG capsule Take 20 mg by mouth daily.    Historical Provider, MD  gabapentin (NEURONTIN) 300 MG capsule Take 1 capsule (300 mg total) by mouth 3 (three) times daily. 12/22/15   Loleta Chance, MD  hydrochlorothiazide (HYDRODIURIL) 25 MG tablet Take 1 tablet (25 mg total) by mouth daily. 12/22/15   Loleta Chance, MD  insulin glargine (LANTUS) 100 UNIT/ML injection Inject 15 Units into the skin daily at 10 pm. 05/23/15   Historical Provider, MD  losartan (COZAAR) 100 MG tablet  Take 1 tablet (100 mg total) by mouth daily. 12/22/15   Loleta Chance, MD  metFORMIN (GLUCOPHAGE) 500 MG tablet Take 2 tablets (1,000 mg total) by mouth 2 (two) times daily with a meal. 12/28/15 12/27/16  Loleta Chance, MD  omeprazole (PRILOSEC) 20 MG capsule Take 1 capsule (20 mg total) by mouth daily. 12/22/15 12/21/16  Loleta Chance, MD  ziprasidone (GEODON) 20 MG capsule Take 20 mg by mouth daily.    Historical Provider, MD   There were no vitals taken for this visit.   Physical Exam  Constitutional: She is oriented to person, place, and time. She appears well-developed and well-nourished.  HENT:   Head: Normocephalic and atraumatic.  Less than 46mm excoriations scattered across scalp; non infectious; non purulent; no insects visualized during examination  Eyes: EOM are normal.  Neck: Normal range of motion.  Cardiovascular: Normal rate.   Pulmonary/Chest: Effort normal. No respiratory distress.  Abdominal: Soft.  Musculoskeletal: Normal range of motion.  Neurological: She is alert and oriented to person, place, and time.  Skin: Skin is warm and dry.  Psychiatric: She has a normal mood and affect.  Nursing note and vitals reviewed.  ED Course  Procedures  DIAGNOSTIC STUDIES: Oxygen Saturation is 98% on RA, normal by my interpretation.    COORDINATION OF CARE: 11:11 AM Discussed next steps with pt. She verbalized understanding and is agreeable with the plan.   Labs Review Labs Reviewed - No data to display  Imaging Review No results found.   EKG Interpretation None      MDM  I have reviewed the relevant previous healthcare records. I obtained HPI from historian.  ED Course:  Assessment: Pt is a 55yF who presents with multiple excoriations on scalp from insect bites. On exam, pt in NAD. Nontoxic/nonseptic appearing. VSS. Afebrile. Excoriations are <80mm in size. Non purulent. Non infectious. Based on HPI they are most likely insect bites across scalp, but no insects seen on exam. Symptoms made worse as patient scratching areas. Given calamine lotion in ED. Plan is to DC home with follow up to PCp. At time of discharge, Patient is in no acute distress. Vital Signs are stable. Patient is able to ambulate. Patient able to tolerate PO.    Disposition/Plan:  DC Home Additional Verbal discharge instructions given and discussed with patient.  Pt Instructed to f/u with PCP in the next week for evaluation and treatment of symptoms. Return precautions given Pt acknowledges and agrees with plan  Supervising Physician Davonna Belling, MD   Final diagnoses:  Multiple  excoriations  Scalp itch    I personally performed the services described in this documentation, which was scribed in my presence. The recorded information has been reviewed and is accurate.    Shary Decamp, PA-C 12/29/15 1126  Davonna Belling, MD 12/29/15 1510

## 2016-01-01 ENCOUNTER — Encounter: Payer: Self-pay | Admitting: Internal Medicine

## 2016-01-02 ENCOUNTER — Telehealth: Payer: Self-pay | Admitting: *Deleted

## 2016-01-02 ENCOUNTER — Encounter: Payer: Medicaid Other | Admitting: Dietician

## 2016-01-02 ENCOUNTER — Ambulatory Visit: Payer: Medicaid Other | Admitting: Internal Medicine

## 2016-01-02 NOTE — Telephone Encounter (Signed)
Patient calling upset that her blood pressure is reportedly 208/122 and 200/198. Reviewed medicines that patient is currently taking. States she is taking her Metformin, lisinopril 100 mg. Once daily, Zantac, baby ASA, gabapentin, and losartan 50 mg. Daily. Reports tapering off the clonidine as ordered, but not starting amlodipine. Attempted to clarify with patient that she needed to start the amlodipine that was ordered and patient replied that she does not have it yet "because that doctor doesn't look at my stuff he sent to my pharmacy in [place], he is just sending it to be sending it." I attempted to update patient's pharmacy and she would not give me a pharmacy for future prescriptions "because there would not be a next time". Per patient "You need to tell the doctor to call me". Was trying to tell patient that if she has any worsening symptoms to go to the ED, but patient hung up.

## 2016-01-02 NOTE — Telephone Encounter (Signed)
I am having a hard time figuring out what her issue is.  Clearly she needs antihypertensive therapy.  She was scheduled to see [place] in clinic today but it looks like she rescheduled the appointment to May 25th.  This is an issue that needs to be explored face to face as getting to the bottom of her true concerns was elusive over the phone.

## 2016-01-11 ENCOUNTER — Ambulatory Visit (INDEPENDENT_AMBULATORY_CARE_PROVIDER_SITE_OTHER): Payer: Medicaid Other | Admitting: Internal Medicine

## 2016-01-11 ENCOUNTER — Encounter: Payer: Self-pay | Admitting: Internal Medicine

## 2016-01-11 ENCOUNTER — Ambulatory Visit: Payer: Medicaid Other | Admitting: Internal Medicine

## 2016-01-11 VITALS — BP 172/94 | HR 98 | Temp 97.9°F | Ht 61.0 in | Wt 167.9 lb

## 2016-01-11 DIAGNOSIS — E1159 Type 2 diabetes mellitus with other circulatory complications: Secondary | ICD-10-CM | POA: Diagnosis not present

## 2016-01-11 DIAGNOSIS — E1100 Type 2 diabetes mellitus with hyperosmolarity without nonketotic hyperglycemic-hyperosmolar coma (NKHHC): Secondary | ICD-10-CM

## 2016-01-11 DIAGNOSIS — I152 Hypertension secondary to endocrine disorders: Secondary | ICD-10-CM | POA: Diagnosis not present

## 2016-01-11 DIAGNOSIS — Z794 Long term (current) use of insulin: Secondary | ICD-10-CM

## 2016-01-11 DIAGNOSIS — I10 Essential (primary) hypertension: Principal | ICD-10-CM

## 2016-01-11 LAB — GLUCOSE, CAPILLARY: GLUCOSE-CAPILLARY: 190 mg/dL — AB (ref 65–99)

## 2016-01-11 NOTE — Assessment & Plan Note (Signed)
Patient continues to take the following medications: Clonidine 0.2 mg bid Losartan 100 mg daily  BP is still quite elevated, [id number] systolic on initial check, 170's on repeat. Has not started taking HCTZ or Norvasc despite clear instructions to do so at her last visit. Was also supposed to taper off of the Clonidine, however she has not done this either. She does not wish to change her medication regimen as she thinks her BP is simply related to white coat HTN and claims it is not elevated normally. VERY clearly explained that this is very likely not the case the the dangers of having uncontrolled HTN. She refuses to change her regimen at this time despite an extensive discussion. She understands the risks of refusing medical recommendations and states that she understands.  -No further management at this time -Patient will follow up with Dr. Melburn Hake when able

## 2016-01-11 NOTE — Assessment & Plan Note (Signed)
Please see HPI for further details. Says her blood sugar is not poorly controlled, does not wish to take Metformin, cannot clearly state a reason.  -No further management at this time.  -Refusing current medical recommendations

## 2016-01-11 NOTE — Patient Instructions (Signed)
1. Please make a follow up appointment as soon as Dr. Melburn Hake is available if you do not wish to see another provider.   2. Please take all medications as previously prescribed.  I WOULD STRONGLY RECOMMEND FOLLOWING THE PREVIOUS ADVICE FROM DR. FLORES.   3. If you have worsening of your symptoms or new symptoms arise, please call the clinic [phone]), or go to the ER immediately if symptoms are severe.

## 2016-01-11 NOTE — Progress Notes (Signed)
Subjective:   Patient ID: Kayla Gregory female   DOB: 1960/12/10 55 y.o.   MRN: [id number]  HPI: [name] is a 55 y.o. female w/ PMHx of DM type II, HTN, HLD, chronic Hep C, Schizophrenia, presents to the clinic today for a follow up visit for her blood pressure and DM type II. During our visit, the patient stated that she had not gone by any of the recommendations that Dr. Melburn Hake had made regarding controlling her blood pressure or her blood sugar. She continues to take the Clonidine despite being tapered off of this medication, she refuses to start the Norvasc, HCTZ, or Metformin. Her reasoning for going against these recommendations are quite vague and she cannot state a clear reason why. She continues to say that her blood pressure is only elevated when she comes to the clinic, despite the fact that her SBP is continuously elevated in the 180-200 range, which is quite atypical for white coat hypertension. She also states that she does not want to take Metformin because she took it before for several years, tolerated it well, but just doesn't want to take this medicine. During our visit, it was very clearly stated that she needs better control of her blood pressure and her blood sugar because of the various complications that can arise from uncontrolled HTN and DM type  II. She stated she "knows her body" and does not wish to make any  changes to her medication regimen. Additionally, she very clearly states that she wishes to see the same provider every time she comes to the clinic, despite prior knowledge that this would not always be the case. We discussed the fact that it is always attempted for patients to see the same provider, however, on many occasions this cannot be arranged.   The adverse outcomes of not following physician recommendations were discussed at length with the patient and she continued to refuse care at this time. She understands the risks of doing so and stated this.       Past Medical History  Diagnosis Date  . Diabetes mellitus without complication ([place])   . Hepatitis C, chronic ([place])   . Hyperlipidemia associated with type 2 diabetes mellitus ([place])   . Hypertension associated with diabetes ([place])   . Schizophrenia [facility])    Current Outpatient Prescriptions  Medication Sig Dispense Refill  . amLODipine (NORVASC) 10 MG tablet Take 1 tablet (10 mg total) by mouth daily. 30 tablet 11  . aspirin (ASPIR-81) 81 MG EC tablet Take 1 tablet (81 mg total) by mouth daily. 90 tablet 3  . atorvastatin (LIPITOR) 40 MG tablet Take 1 tablet (40 mg total) by mouth daily. 90 tablet 3  . calamine lotion Apply 1 application topically as needed for itching. 120 mL 0  . cetirizine (ZYRTEC) 10 MG tablet Take 1 tablet (10 mg total) by mouth daily as needed for itching. 90 tablet 3  . diphenhydrAMINE (BENADRYL) 25 mg capsule Take 1 capsule (25 mg total) by mouth at bedtime as needed for itching. 90 capsule 3  . FLUoxetine (PROZAC) 20 MG capsule Take 20 mg by mouth daily.    Marland Kitchen gabapentin (NEURONTIN) 300 MG capsule Take 1 capsule (300 mg total) by mouth 3 (three) times daily. 270 capsule 3  . hydrochlorothiazide (HYDRODIURIL) 25 MG tablet Take 1 tablet (25 mg total) by mouth daily. 90 tablet 3  . insulin glargine (LANTUS) 100 UNIT/ML injection Inject 15 Units into the skin daily at 10  pm.    . losartan (COZAAR) 100 MG tablet Take 1 tablet (100 mg total) by mouth daily. 90 tablet 3  . metFORMIN (GLUCOPHAGE) 500 MG tablet Take 2 tablets (1,000 mg total) by mouth 2 (two) times daily with a meal. 270 tablet 3  . omeprazole (PRILOSEC) 20 MG capsule Take 1 capsule (20 mg total) by mouth daily. 30 capsule 1  . ziprasidone (GEODON) 20 MG capsule Take 20 mg by mouth daily.     No current facility-administered medications for this visit.    Review of Systems: General: Denies fever, chills, diaphoresis, appetite change and fatigue.  Respiratory: Denies SOB, DOE, cough, and wheezing.    Cardiovascular: Denies chest pain and palpitations.  Gastrointestinal: Denies nausea, vomiting, abdominal pain, and diarrhea.  Genitourinary: Denies dysuria, increased frequency, and flank pain. Endocrine: Denies hot or cold intolerance, polyuria, and polydipsia. Musculoskeletal: Denies myalgias, back pain, joint swelling, arthralgias and gait problem.  Skin: Denies pallor, rash and wounds.  Neurological: Denies dizziness, seizures, syncope, weakness, lightheadedness, numbness and headaches.  Psychiatric/Behavioral: Denies mood changes, and sleep disturbances.  Objective:   Physical Exam: Filed Vitals:   01/11/16 1001  BP: 172/94  Pulse: 98  Temp: 97.9 F (36.6 C)  TempSrc: Oral  Height: 5\' 1"  (1.549 m)  Weight: 167 lb 14.4 oz (76.159 kg)  SpO2: 96%    Refused physical exam   Assessment & Plan:   Please see problem based assessment and plan.

## 2016-01-12 NOTE — Progress Notes (Signed)
Internal Medicine Clinic Attending  Case discussed with Dr. Jones at the time of the visit.  We reviewed the resident's history and exam and pertinent patient test results.  I agree with the assessment, diagnosis, and plan of care documented in the resident's note.  

## 2016-01-19 ENCOUNTER — Other Ambulatory Visit (INDEPENDENT_AMBULATORY_CARE_PROVIDER_SITE_OTHER): Payer: Medicaid Other

## 2016-01-19 DIAGNOSIS — Z1211 Encounter for screening for malignant neoplasm of colon: Secondary | ICD-10-CM | POA: Diagnosis present

## 2016-01-19 LAB — POC HEMOCCULT BLD/STL (HOME/3-CARD/SCREEN)
FECAL OCCULT BLD: NEGATIVE
FECAL OCCULT BLD: NEGATIVE
Fecal Occult Blood, POC: NEGATIVE

## 2016-01-19 NOTE — Addendum Note (Signed)
Addended by: Orson Gear on: 01/19/2016 03:51 PM   Modules accepted: Orders

## 2016-02-01 NOTE — Addendum Note (Signed)
Addended by: Hulan Fray on: 02/01/2016 01:13 PM   Modules accepted: Orders

## 2016-02-02 ENCOUNTER — Ambulatory Visit (INDEPENDENT_AMBULATORY_CARE_PROVIDER_SITE_OTHER): Payer: Medicaid Other | Admitting: Internal Medicine

## 2016-02-02 ENCOUNTER — Encounter: Payer: Self-pay | Admitting: Internal Medicine

## 2016-02-02 VITALS — BP 156/83 | HR 89 | Temp 98.1°F | Ht 61.0 in | Wt 174.0 lb

## 2016-02-02 DIAGNOSIS — E119 Type 2 diabetes mellitus without complications: Secondary | ICD-10-CM

## 2016-02-02 DIAGNOSIS — T25211A Burn of second degree of right ankle, initial encounter: Secondary | ICD-10-CM

## 2016-02-02 DIAGNOSIS — T25219A Burn of second degree of unspecified ankle, initial encounter: Secondary | ICD-10-CM | POA: Insufficient documentation

## 2016-02-02 DIAGNOSIS — X101XXA Contact with hot food, initial encounter: Secondary | ICD-10-CM

## 2016-02-02 LAB — GLUCOSE, CAPILLARY: GLUCOSE-CAPILLARY: 263 mg/dL — AB (ref 65–99)

## 2016-02-02 NOTE — Patient Instructions (Signed)
1. The burn looks OK. DO NOT apply any ointments or creams to the affected area.  DO NOT rupture the blister or drain the fluid in any way.  Use only soap and warm water to GENTLY clean the area.  Burn Care Your skin is a natural barrier to infection. It is the largest organ of your body. Burns damage this natural protection. To help prevent infection, it is very important to follow your caregiver's instructions in the care of your burn. Burns are classified as:  First degree. There is only redness of the skin (erythema). No scarring is expected.  Second degree. There is blistering of the skin. Scarring may occur with deeper burns.  Third degree. All layers of the skin are injured, and scarring is expected. HOME CARE INSTRUCTIONS   Wash your hands well before changing your bandage.  Change your bandage as often as directed by your caregiver.  Remove the old bandage. If the bandage sticks, you may soak it off with cool, clean water.  Cleanse the burn thoroughly but gently with mild soap and water.  Pat the area dry with a clean, dry cloth.  Apply a clean bandage as instructed by your caregiver.  Keep the bandage as clean and dry as possible.  Elevate the affected area for the first 24 hours, then as instructed by your caregiver.  Only take over-the-counter or prescription medicines for pain, discomfort, or fever as directed by your caregiver. SEEK IMMEDIATE MEDICAL CARE IF:   You develop excessive pain.  You develop redness, tenderness, swelling, or red streaks near the burn.  The burned area develops yellowish-white fluid (pus) or a bad smell.  You have a fever. MAKE SURE YOU:   Understand these instructions.  Will watch your condition.  Will get help right away if you are not doing well or get worse.   This information is not intended to replace advice given to you by your health care provider. Make sure you discuss any questions you have with your health care  provider.   Document Released: 08/05/2005 Document Revised: 10/28/2011 Document Reviewed: 12/26/2010 Elsevier Interactive Patient Education [employer].
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
## 2016-02-05 NOTE — Progress Notes (Signed)
Internal Medicine Clinic Attending  Case discussed with Dr. Taylor at the time of the visit.  We reviewed the resident's history and exam and pertinent patient test results.  I agree with the assessment, diagnosis, and plan of care documented in the resident's note. 

## 2016-02-15 ENCOUNTER — Other Ambulatory Visit: Payer: Self-pay | Admitting: Internal Medicine

## 2016-02-15 DIAGNOSIS — I10 Essential (primary) hypertension: Principal | ICD-10-CM

## 2016-02-15 DIAGNOSIS — E1169 Type 2 diabetes mellitus with other specified complication: Secondary | ICD-10-CM

## 2016-02-15 DIAGNOSIS — E1159 Type 2 diabetes mellitus with other circulatory complications: Secondary | ICD-10-CM

## 2016-02-15 DIAGNOSIS — K219 Gastro-esophageal reflux disease without esophagitis: Secondary | ICD-10-CM

## 2016-02-15 DIAGNOSIS — E785 Hyperlipidemia, unspecified: Secondary | ICD-10-CM

## 2016-02-15 DIAGNOSIS — E1100 Type 2 diabetes mellitus with hyperosmolarity without nonketotic hyperglycemic-hyperosmolar coma (NKHHC): Secondary | ICD-10-CM

## 2016-02-15 DIAGNOSIS — I208 Other forms of angina pectoris: Secondary | ICD-10-CM

## 2016-02-15 DIAGNOSIS — I152 Hypertension secondary to endocrine disorders: Secondary | ICD-10-CM

## 2016-02-15 DIAGNOSIS — L299 Pruritus, unspecified: Secondary | ICD-10-CM

## 2016-02-15 DIAGNOSIS — Z794 Long term (current) use of insulin: Secondary | ICD-10-CM

## 2016-02-15 NOTE — Telephone Encounter (Signed)
Left MSG to return call.

## 2016-02-15 NOTE — Telephone Encounter (Signed)
Please call pt back.

## 2016-02-15 NOTE — Telephone Encounter (Signed)
amLODipine (NORVASC) 10 MG tablet losartan (COZAAR) 100 MG tablet diphenhydrAMINE (BENADRYL) 25 mg capsule  [facility] [address]

## 2016-02-16 ENCOUNTER — Telehealth: Payer: Self-pay | Admitting: *Deleted

## 2016-02-16 MED ORDER — GABAPENTIN 300 MG PO CAPS: 300.0000 mg | ORAL_CAPSULE | Freq: Three times a day (TID) | ORAL | Status: DC

## 2016-02-16 MED ORDER — AMLODIPINE BESYLATE 10 MG PO TABS: 10.0000 mg | ORAL_TABLET | Freq: Every day | ORAL | Status: DC

## 2016-02-16 MED ORDER — INSULIN GLARGINE 100 UNIT/ML ~~LOC~~ SOLN: 15.0000 [IU] | Freq: Every day | SUBCUTANEOUS | Status: DC

## 2016-02-16 MED ORDER — CALAMINE EX LOTN: 1.0000 "application " | TOPICAL_LOTION | CUTANEOUS | Status: DC | PRN

## 2016-02-16 MED ORDER — HYDROCHLOROTHIAZIDE 25 MG PO TABS: 25.0000 mg | ORAL_TABLET | Freq: Every day | ORAL | Status: DC

## 2016-02-16 MED ORDER — METFORMIN HCL 500 MG PO TABS: 1000.0000 mg | ORAL_TABLET | Freq: Two times a day (BID) | ORAL | Status: DC

## 2016-02-16 MED ORDER — ASPIRIN 81 MG PO TBEC: 81.0000 mg | DELAYED_RELEASE_TABLET | Freq: Every day | ORAL | Status: DC

## 2016-02-16 MED ORDER — CETIRIZINE HCL 10 MG PO TABS: ORAL_TABLET | ORAL | Status: DC

## 2016-02-16 MED ORDER — DIPHENHYDRAMINE HCL 25 MG PO CAPS: 25.0000 mg | ORAL_CAPSULE | Freq: Every evening | ORAL | Status: DC | PRN

## 2016-02-16 MED ORDER — ZIPRASIDONE HCL 20 MG PO CAPS: 20.0000 mg | ORAL_CAPSULE | Freq: Every day | ORAL | Status: DC

## 2016-02-16 MED ORDER — ATORVASTATIN CALCIUM 40 MG PO TABS: 40.0000 mg | ORAL_TABLET | Freq: Every day | ORAL | Status: DC

## 2016-02-16 MED ORDER — OMEPRAZOLE 20 MG PO CPDR: 20.0000 mg | DELAYED_RELEASE_CAPSULE | Freq: Every day | ORAL | Status: DC

## 2016-02-16 MED ORDER — FLUOXETINE HCL 20 MG PO CAPS: 20.0000 mg | ORAL_CAPSULE | Freq: Every day | ORAL | Status: DC

## 2016-02-16 MED ORDER — LOSARTAN POTASSIUM 100 MG PO TABS: 100.0000 mg | ORAL_TABLET | Freq: Every day | ORAL | Status: DC

## 2016-02-16 NOTE — Telephone Encounter (Signed)
PA request was submitted online via [employer] per previous notes, pt states she has been on lisinopril in the past.  Request sent for review.Despina Hidden Cassady6/30/20174:32 PM    [id number] [facility]  SN:[id number] KAITRIN DECOUX  02/16/2016 SUSPENDED DMA   Request approved

## 2016-02-16 NOTE — Telephone Encounter (Signed)
Pt upset - stated her meds were sent to the wrong pharmacy in [place]; she does not live there any longer; now she's out of medications esp her high blood pressure medication. [facility] [address].

## 2016-02-16 NOTE — Telephone Encounter (Signed)
Needs PA on losartan, pt is not happy, passed to [place].

## 2016-02-21 ENCOUNTER — Encounter: Payer: Self-pay | Admitting: Internal Medicine

## 2016-02-21 ENCOUNTER — Ambulatory Visit (INDEPENDENT_AMBULATORY_CARE_PROVIDER_SITE_OTHER): Payer: Medicaid Other | Admitting: Internal Medicine

## 2016-02-21 VITALS — BP 151/78 | HR 88 | Temp 98.2°F | Ht 61.0 in | Wt 171.6 lb

## 2016-02-21 DIAGNOSIS — I152 Hypertension secondary to endocrine disorders: Secondary | ICD-10-CM

## 2016-02-21 DIAGNOSIS — E1100 Type 2 diabetes mellitus with hyperosmolarity without nonketotic hyperglycemic-hyperosmolar coma (NKHHC): Secondary | ICD-10-CM

## 2016-02-21 DIAGNOSIS — E1159 Type 2 diabetes mellitus with other circulatory complications: Secondary | ICD-10-CM

## 2016-02-21 DIAGNOSIS — E11649 Type 2 diabetes mellitus with hypoglycemia without coma: Secondary | ICD-10-CM | POA: Diagnosis not present

## 2016-02-21 DIAGNOSIS — Z7984 Long term (current) use of oral hypoglycemic drugs: Secondary | ICD-10-CM

## 2016-02-21 DIAGNOSIS — I208 Other forms of angina pectoris: Secondary | ICD-10-CM

## 2016-02-21 DIAGNOSIS — I2089 Other forms of angina pectoris: Secondary | ICD-10-CM

## 2016-02-21 DIAGNOSIS — Z794 Long term (current) use of insulin: Secondary | ICD-10-CM

## 2016-02-21 DIAGNOSIS — Z0000 Encounter for general adult medical examination without abnormal findings: Secondary | ICD-10-CM

## 2016-02-21 DIAGNOSIS — I10 Essential (primary) hypertension: Principal | ICD-10-CM

## 2016-02-21 LAB — GLUCOSE, CAPILLARY
GLUCOSE-CAPILLARY: 41 mg/dL — AB (ref 65–99)
GLUCOSE-CAPILLARY: 102 mg/dL — AB (ref 65–99)

## 2016-02-21 MED ORDER — METFORMIN HCL ER 500 MG PO TB24: 500.0000 mg | ORAL_TABLET | Freq: Every day | ORAL | Status: DC

## 2016-02-21 MED ORDER — ACCU-CHEK AVIVA CONNECT W/DEVICE KIT: 1.0000 "application " | PACK | Freq: Three times a day (TID) | Status: DC

## 2016-02-21 MED ORDER — ACCU-CHEK SOFTCLIX LANCET DEV MISC: Status: DC

## 2016-02-21 MED ORDER — GLUCOSE BLOOD VI STRP: ORAL_STRIP | Status: DC

## 2016-02-21 NOTE — Assessment & Plan Note (Addendum)
Lab Results  Component Value Date   HGBA1C 7.4* 12/22/2015     Assessment: Patient did not bring her medications with her today. She is inconsistent when telling me what she is currently on. Reports being on Novolog but when questioned she says that she does not take it and only takes the Lantus. When asked the dosing she reports 50 units qhs when only prescribed 15 units qhs. Reports frequent lows when she checks at home (uses her mom's meter). Says she usually runs 70-90s. Can be as low as 40s. Reports some mild hand shaking when she is low but no other symptoms. Says she 'eats something sugary' when it is low. Reports taking the Metformin 500 mg once a day but complains of upset stomach with stomach cramps.   Today, on arrival her CBG was 43.  Improved to 102 after glucose tablets and orange juice. Denies any symptoms other than hand shaking. Says she did not eat breakfast this morning. Tells me she normally eats breakfast every day and never skips meals. However, later reports she has class at 8 am and misses breakfast a lot.  Plan: -It is difficult to tell what she is actually taking at this time. Will stop all her insulin as patient is having reported frequent lows.  -Will change metformin to extended release for lower GI side effects -Rx for meter/lancets/strips -RTC in 1 month for A1c and re-check.  -Consider starting SGLT-2 or DPP4 over insulin if A1c remains elevated -DM foot exam at next visit

## 2016-02-21 NOTE — Patient Instructions (Addendum)
Kayla Gregory,  I would like for you to STOP ALL INSULIN INJECTIONS. This includes the Novolog/Humalog and Lantus. You are having a lot of low blood sugars and the insulin is likely the culprit. Continue to check your blood sugars at home, please keep a log of this as well. I am going to send in a prescription for you for Metformin Extended Release. It is easier on your stomach and causes less nausea/upset stomach. Please take 1 in the morning. Please fill and continue taking your blood pressure medications. If you can get a blood pressure cuff and check your BP daily and keep a log and bring it to your next appointment.  I would like to see you back in 1 month for follow up. Please bring all your medications with you to your next visit.

## 2016-02-21 NOTE — Progress Notes (Signed)
Hypoglycemic Event  CBG: 41  Treatment: Glucose tablets and orange juice  Symptoms: None  Follow-up CBG: Time: 15:52 CBG Result: 102   Possible Reasons for Event: Had not eaten today  Comments/MD notified: Yes. Dr. Williams Che, Trey Sailors

## 2016-02-21 NOTE — Assessment & Plan Note (Signed)
Patient denies any chest pain or shortness of breath. Does not appear that she has not had the stress test scheduled at this point.  Plan  Follow up and have the stress test scheduled

## 2016-02-21 NOTE — Assessment & Plan Note (Signed)
Due for PAP - schedule for next visit Reports mammogram last year and colonoscopy last year. Attempt to get records.

## 2016-02-21 NOTE — Assessment & Plan Note (Signed)
BP Readings from Last 3 Encounters:  02/21/16 151/78  02/02/16 156/83  01/11/16 172/94    Lab Results  Component Value Date   NA 139 12/22/2015   K 4.2 12/22/2015   CREATININE 1.02* 12/22/2015    Assessment: Patient's BP today is 151/78 today. It is unclear what she is taking at this point. Did not bring her medications with her today. She reports taking the Losartan and the Amlodipine (ran out and did not take it today?). Denies taking the Clonidine to me. Unclear if she is taking the HCTZ or not.  She does report occasional headaches. Does not check her BP at home.  No electrolyte abnormalities at 5/5 visit.  Plan Will continue her current regimen. Stressed the importance of taking her medications as prescribed and strongly encouraged her to bring her medications to her next visit. Advised getting a BP cuff and keeping a log of her BP at home. RTC in 1 month for follow up.

## 2016-02-21 NOTE — Progress Notes (Signed)
   CC: DM and HTN HPI: [name] is a 55 y.o. female with a past medical history listed below here today for follow up of her DM and HTN.  For details of today's visit and the status of her chronic medical issues please refer to the assessment and plan.  Past Medical History  Diagnosis Date  . Diabetes mellitus without complication ([place])   . Hepatitis C, chronic ([place])   . Hyperlipidemia associated with type 2 diabetes mellitus ([place])   . Hypertension associated with diabetes ([place])   . Schizophrenia ([place])    Review of Systems: Review of Systems  Eyes: Positive for blurred vision.  Cardiovascular: Negative for chest pain and palpitations.  Gastrointestinal: Positive for abdominal pain. Negative for nausea, vomiting and diarrhea.  Neurological: Positive for dizziness, tremors and headaches. Negative for tingling.   Physical Exam: Filed Vitals:   02/21/16 1508  BP: 151/78  Pulse: 88  Temp: 98.2 F (36.8 C)  TempSrc: Oral  Height: 5\' 1"  (1.549 m)  Weight: 171 lb 9.6 oz (77.837 kg)  SpO2: 98%   GENERAL- alert, co-operative, appears as stated age, not in any distress. CARDIAC- RRR, no murmurs, rubs or gallops. RESP- Moving equal volumes of air, and clear to auscultation bilaterally, no wheezes or crackles. ABDOMEN- Soft, nontender, bowel sounds present. EXTREMITIES- pulse 2+, symmetric, no pedal edema. SKIN- Warm, dry  Assessment & Plan:  See encounters tab for problem based medical decision making. Patient discussed with Dr. Dareen Piano. Please see problem based charting for further details of today's visit.

## 2016-02-22 NOTE — Progress Notes (Signed)
Internal Medicine Clinic Attending  Case discussed with Dr. Boswell at the time of the visit.  We reviewed the resident's history and exam and pertinent patient test results.  I agree with the assessment, diagnosis, and plan of care documented in the resident's note.  

## 2016-03-13 ENCOUNTER — Telehealth: Payer: Self-pay | Admitting: Internal Medicine

## 2016-03-13 NOTE — Telephone Encounter (Signed)
Needs to talk to nurse about problem

## 2016-03-14 NOTE — Telephone Encounter (Signed)
rtc to pt, she talked about she is taking too much medicine and not the right kind of medicine, that she almost got in trouble with medicaid because of dr Melburn Hake calling the pharmacy in [place] and wanting the doctors to leave her meds alone like dr bland did, she states dr bland is a good doctor and knew what to do but these student doctors want to push medicine on her. She was offered an appt to apeak to a physician about medicine but desired an appt with dr boswell, next available is oct and she will take that one she states she is going to stop the metformin

## 2016-03-14 NOTE — Telephone Encounter (Signed)
I saw her earlier this month and wanted to see her back after 1 month. I would like to see her sooner than October. Will route to attendings and possibly add her onto my schedule for overbooking on 8/9 clinic schedule. Will likely be useful to have attending speak with patient at next visit as well as she appears to have issues with her care from the residents based on her telephone note.

## 2016-03-15 NOTE — Telephone Encounter (Signed)
Triage or front desk. When you speak to her to sch the appt, would you pls stress that she needs to bring in all of her medicines in two bags. This includes her insulin. In the first bag, she needs all of the bottles that she takes on a regular basis. In the second bag, all of the bottles she has but does not take. There has been sig confusion over what she is taking and not taking over the past several visits which makes it very challenging to care for her properly. Additionally, for the three days prior to the appt, she needs to check her sugar 4 times a day when she first wakes up and is fasting, before lunch, before dinner, before bed. She needs to write down how much insulin she took and when in relation to meals over those three days. This information is crucial to getting her meds properly adjusted. Thanks

## 2016-03-18 NOTE — Telephone Encounter (Signed)
Called pt and scheduled for 8/9 at 1300, pt again c/o residents providing care to her, I attempted to convey dr butcher's instructions for an appt but she said she knows how to do things and will do it. She also stated the residents are working with [facility] to get money by prescribing meds that will harm her. I attempted to explain that there is no relationship [facility] and [place] but stopped at that. I have spoken to dr Software engineer and made pt aware of expectations for the visit 8/9.

## 2016-03-18 NOTE — Telephone Encounter (Signed)
Kayla Gregory. Has been made aware and is adding pt at 1300 8/9

## 2016-03-27 ENCOUNTER — Ambulatory Visit (INDEPENDENT_AMBULATORY_CARE_PROVIDER_SITE_OTHER): Payer: Medicaid Other | Admitting: Internal Medicine

## 2016-03-27 ENCOUNTER — Encounter (INDEPENDENT_AMBULATORY_CARE_PROVIDER_SITE_OTHER): Payer: Self-pay

## 2016-03-27 VITALS — BP 154/86 | HR 79 | Temp 97.7°F | Ht 61.0 in | Wt 172.2 lb

## 2016-03-27 DIAGNOSIS — I208 Other forms of angina pectoris: Secondary | ICD-10-CM | POA: Diagnosis not present

## 2016-03-27 DIAGNOSIS — H9201 Otalgia, right ear: Secondary | ICD-10-CM | POA: Diagnosis not present

## 2016-03-27 DIAGNOSIS — Z0000 Encounter for general adult medical examination without abnormal findings: Secondary | ICD-10-CM

## 2016-03-27 DIAGNOSIS — I152 Hypertension secondary to endocrine disorders: Secondary | ICD-10-CM

## 2016-03-27 DIAGNOSIS — E1100 Type 2 diabetes mellitus with hyperosmolarity without nonketotic hyperglycemic-hyperosmolar coma (NKHHC): Secondary | ICD-10-CM

## 2016-03-27 DIAGNOSIS — E11649 Type 2 diabetes mellitus with hypoglycemia without coma: Secondary | ICD-10-CM | POA: Diagnosis not present

## 2016-03-27 DIAGNOSIS — E1159 Type 2 diabetes mellitus with other circulatory complications: Secondary | ICD-10-CM | POA: Diagnosis not present

## 2016-03-27 DIAGNOSIS — Z794 Long term (current) use of insulin: Secondary | ICD-10-CM

## 2016-03-27 DIAGNOSIS — I10 Essential (primary) hypertension: Secondary | ICD-10-CM

## 2016-03-27 LAB — GLUCOSE, CAPILLARY
GLUCOSE-CAPILLARY: 48 mg/dL — AB (ref 65–99)
GLUCOSE-CAPILLARY: 89 mg/dL (ref 65–99)

## 2016-03-27 LAB — POCT GLYCOSYLATED HEMOGLOBIN (HGB A1C): HEMOGLOBIN A1C: 7.7

## 2016-03-27 MED ORDER — OLMESARTAN-AMLODIPINE-HCTZ 40-10-12.5 MG PO TABS: 1.0000 | ORAL_TABLET | Freq: Every day | ORAL | 2 refills | Status: DC

## 2016-03-27 MED ORDER — RANITIDINE HCL 150 MG PO TABS: 150.0000 mg | ORAL_TABLET | Freq: Two times a day (BID) | ORAL | 2 refills | Status: DC

## 2016-03-27 NOTE — Assessment & Plan Note (Signed)
Patient was an overbook schedule today. Will perform PAP at October visit.   Requesting colonoscopy and mammogram records again today.

## 2016-03-27 NOTE — Assessment & Plan Note (Addendum)
Denies any chest pain or shortness of breath. Never had stress test done. Does not appear to have been scheduled.   Plan Will attempt to have stress test scheduled again today.

## 2016-03-27 NOTE — Assessment & Plan Note (Addendum)
Lab Results  Component Value Date   HGBA1C 7.4 (H) 12/22/2015     Assessment: Patient brought her medications with her today. She is currently not on any diabetic medications. She reports she has had problems with metformin in the past and cannot tolerate it. Reports GI upset with diarrhea.   A1c slightly worse today 7.4>7.7. Patient reports 1 episode of hypoglycemia in the past month. Reports her only symptom is hands shaking. Does not check her blood sugars at home. Reports being told a meter would cost her $90.  Her CBG was 48 on arrival. Improved to 89 after glucose tablets and glucose. Reports eating breakfast this morning but no lunch. Reports being asymptomatic.   Patient was hypoglycemic at 43 at last visit.   Plan: -Resend Rx for meter - should cost 3 dollars with medicaid.  -Asked her to check her sugars 3-4 times daily.  -Given her being hypoglycemic at her visits and reporting hypoglycemic episodes at home will avoid any hypoglycemic inducing medications.  -Will have her bring meter to next visit and get an idea of what her sugars run at home. Can consider starting SGLT-2 or DPP4 at next visit.  -RTC in 1 month

## 2016-03-27 NOTE — Progress Notes (Signed)
   CC: DM and HTN HPI: Kayla Gregory is a 55 y.o. female with a past medical history listed below here today for follow up of her DM and HTN.  For details of today's visit and the status of her chronic medical issues please refer to the assessment and plan.  Past Medical History:  Diagnosis Date  . Diabetes mellitus without complication ([place])   . Hepatitis C, chronic ([place])   . Hyperlipidemia associated with type 2 diabetes mellitus ([place])   . Hypertension associated with diabetes ([place])   . Schizophrenia ([place])    Review of Systems: Review of Systems  Eyes: Negative for blurred vision.  Cardiovascular: Negative for chest pain and palpitations.  Gastrointestinal: Positive for abdominal pain and diarrhea. Negative for nausea and vomiting.  Neurological: Positive for tremors. Negative for dizziness, tingling and headaches.   Physical Exam: Vitals:   03/27/16 1339 03/27/16 1510  BP: (!) 147/81 (!) 154/86  Pulse: 78 79  Temp: 97.7 F (36.5 C)   TempSrc: Oral   SpO2: 98%   Weight: 172 lb 3.2 oz (78.1 kg)   Height: 5\' 1"  (1.549 m)    GENERAL- alert, co-operative, appears as stated age, not in any distress. HEENT - External auditory canals and tympanic membranes clear, hearing grossly intact. No auricular tenderness. No erythema present. No nodules.  CARDIAC- RRR, no murmurs, rubs or gallops. RESP- Moving equal volumes of air, and clear to auscultation bilaterally, no wheezes or crackles. ABDOMEN- Soft, nontender, bowel sounds present. EXTREMITIES- pulse 2+, symmetric, no pedal edema. SKIN- Warm, dry  Assessment & Plan:  See encounters tab for problem based medical decision making. Patient discussed with Dr. Daryll Drown. Please see problem based charting for further details of today's visit.

## 2016-03-27 NOTE — Progress Notes (Signed)
Hypoglycemic Event  CBG: 54  Treatment: 15 GM carbohydrate snack  Symptoms: None  Follow-up CBG: Time: 89 CBG Result: 2:45  Possible Reasons for Event: Unknown  Comments/MD notified:Yes    Janith Nielson, [employer]

## 2016-03-27 NOTE — Assessment & Plan Note (Addendum)
BP Readings from Last 3 Encounters:  02/21/16 (!) 151/78  02/02/16 (!) 156/83  01/11/16 (!) 172/94    Lab Results  Component Value Date   NA 139 12/22/2015   K 4.2 12/22/2015   CREATININE 1.02 (H) 12/22/2015    Assessment: Patient brought her medications with her today. She is currently taking amlodipine 10 mg daily and losartan 100 mg daily. Has not been taking the HCTZ 25 mg daily.   BP today is elevated at 147/81. Repeat BP 154/86. Patient denies any dizziness, lightheadedness, headaches, blurry vision, chest pain. Does not check her BP at home.   Discussed why she was not taking the HCTZ and she said she says she had leg swelling that resolved when she stopped taking it as well has felt like she was urinating too much.   Assessment: Patient is very reluctant to take any medications. She reports she is already on too many medications. Discussed combination pills to cut down on her pill burden. Patient agreeable.   Plan: Will start olmesartan-amlodipine-hctz 40-10-12.5 combination pill today.   RTC in 1 month for BP recheck

## 2016-03-27 NOTE — Patient Instructions (Signed)
Thank you for coming in today  I am going to combine your Amlodipine, Cozaar, and Hydrochlorothiazide pills into one combination pill. The new prescription will be called TRIBENZOR.   Please check your blood sugars 3-4 times a day and keep a log. Please bring this with you to your next visit.

## 2016-03-28 MED ORDER — ACCU-CHEK SOFTCLIX LANCET DEV MISC: 2 refills | Status: DC

## 2016-03-28 MED ORDER — ACCU-CHEK AVIVA CONNECT W/DEVICE KIT: 1.0000 "application " | PACK | Freq: Three times a day (TID) | 0 refills | Status: DC

## 2016-03-28 MED ORDER — GLUCOSE BLOOD VI STRP: ORAL_STRIP | 12 refills | Status: DC

## 2016-03-30 MED ORDER — GLUCOSE BLOOD VI STRP: ORAL_STRIP | 12 refills | Status: DC

## 2016-03-30 MED ORDER — ACCU-CHEK SOFTCLIX LANCET DEV MISC: 2 refills | Status: DC

## 2016-03-30 MED ORDER — ACCU-CHEK AVIVA PLUS W/DEVICE KIT: PACK | 0 refills | Status: DC

## 2016-03-31 DIAGNOSIS — H9201 Otalgia, right ear: Secondary | ICD-10-CM | POA: Insufficient documentation

## 2016-03-31 NOTE — Assessment & Plan Note (Signed)
Patient with complaints of right ear pain today. She says that this has been ongoing for the past month. Unable to elaborate more on the character of the pain. States when she presses behind her earlobe she has pain. Feels like there is a knot there. No pain without any pressure. No decreased hearing. No recent fevers, chills. No pus or drainage.   External auditory canals and tympanic membranes clear, hearing grossly intact. No auricular tenderness. No erythema present. No nodules.   Plan No signs of infection. Will monitor. Return to clinic if symptoms worsen.

## 2016-04-01 NOTE — Progress Notes (Signed)
Internal Medicine Clinic Attending  Case discussed with Dr. Boswell at the time of the visit.  We reviewed the resident's history and exam and pertinent patient test results.  I agree with the assessment, diagnosis, and plan of care documented in the resident's note.  

## 2016-04-04 ENCOUNTER — Telehealth: Payer: Self-pay | Admitting: *Deleted

## 2016-04-04 NOTE — Telephone Encounter (Signed)
Needs prior auth. For olmesartan-amlodipine-HCTZ. Thanks!

## 2016-04-08 ENCOUNTER — Telehealth: Payer: Self-pay | Admitting: *Deleted

## 2016-04-08 NOTE — Telephone Encounter (Signed)
Call to [employer] for Prior Authorization for Olme/Amlo/HCTZ 40-10-12.5 mg .  Patient has to try and fail an Ace Inhibitor along with HCTZ and Enforge.  Message to be sent to Dr. Charlynn Grimes for review and possible change min medication.  Sander Nephew, RN 04/08/2016 11:55 AM

## 2016-04-10 MED ORDER — AMLODIPINE BESYLATE 10 MG PO TABS: 10.0000 mg | ORAL_TABLET | Freq: Every day | ORAL | 11 refills | Status: DC

## 2016-04-10 MED ORDER — LOSARTAN POTASSIUM-HCTZ 100-25 MG PO TABS: 1.0000 | ORAL_TABLET | Freq: Every day | ORAL | 11 refills | Status: DC

## 2016-04-10 NOTE — Telephone Encounter (Signed)
Attempting to reduce patient's pill burden as she has not been taking medications as prescribed due to feeling like she is taking too many pills. Since unable to obtain Olme/Amlo/HCTZ will change her to losartan-hctz (hyzaar) 100-25 mg daily and amlodipine 10 mg daily.

## 2016-04-15 ENCOUNTER — Telehealth: Payer: Self-pay | Admitting: *Deleted

## 2016-04-15 NOTE — Telephone Encounter (Signed)
Call to [place] for Prior Authorization for Losartan/HCTZ 11-25 mg tablets.  Ref. # [id number].  PA # is [id number].   Contact# is  [id number].  Determination in 24 hours.  Sander Nephew, RN 04/15/2016 12:01 PM

## 2016-04-29 ENCOUNTER — Other Ambulatory Visit: Payer: Self-pay | Admitting: Internal Medicine

## 2016-04-29 ENCOUNTER — Encounter: Payer: Self-pay | Admitting: Internal Medicine

## 2016-04-29 DIAGNOSIS — Z794 Long term (current) use of insulin: Secondary | ICD-10-CM

## 2016-04-29 DIAGNOSIS — I208 Other forms of angina pectoris: Secondary | ICD-10-CM

## 2016-04-29 DIAGNOSIS — K219 Gastro-esophageal reflux disease without esophagitis: Secondary | ICD-10-CM

## 2016-04-29 DIAGNOSIS — E1100 Type 2 diabetes mellitus with hyperosmolarity without nonketotic hyperglycemic-hyperosmolar coma (NKHHC): Secondary | ICD-10-CM

## 2016-04-29 NOTE — Telephone Encounter (Signed)
Pt states she needs refills of all medications  Would like to discuss with nurse [facility]

## 2016-04-29 NOTE — Telephone Encounter (Signed)
Pt upset that some of [facility]. [facility] from [facility] per per pt's request (preferred pharmacies). Informed she can [facility] transferred [facility]; stated it's not her job but the [place]. Also stated she will not be taking Metformin.

## 2016-04-30 MED ORDER — ASPIRIN 81 MG PO TBEC: 81.0000 mg | DELAYED_RELEASE_TABLET | Freq: Every day | ORAL | 2 refills | Status: DC

## 2016-04-30 MED ORDER — OMEPRAZOLE 20 MG PO CPDR: 20.0000 mg | DELAYED_RELEASE_CAPSULE | Freq: Every day | ORAL | 2 refills | Status: DC

## 2016-04-30 MED ORDER — GABAPENTIN 300 MG PO CAPS: 300.0000 mg | ORAL_CAPSULE | Freq: Three times a day (TID) | ORAL | 2 refills | Status: DC

## 2016-04-30 MED ORDER — GLUCOSE BLOOD VI STRP: ORAL_STRIP | 12 refills | Status: DC

## 2016-04-30 MED ORDER — METFORMIN HCL ER 500 MG PO TB24: 500.0000 mg | ORAL_TABLET | Freq: Every day | ORAL | 0 refills | Status: DC

## 2016-05-01 ENCOUNTER — Other Ambulatory Visit: Payer: Self-pay | Admitting: Internal Medicine

## 2016-05-01 DIAGNOSIS — L299 Pruritus, unspecified: Secondary | ICD-10-CM

## 2016-05-01 NOTE — Telephone Encounter (Signed)
Pt requesting  A benadryl refill

## 2016-05-02 ENCOUNTER — Telehealth: Payer: Self-pay | Admitting: *Deleted

## 2016-05-02 NOTE — Telephone Encounter (Signed)
Call from pt - states "We" have changed her medications; started with Dr Melburn Hake. Asked did she bring her meds at her last visit, states she did. States ankle swelling from medication; unsure which one - offer an appt; pt refuse says she has an appt in Oct (with Dr Charlynn Grimes). After further complaining from pt about medication,care - finally agree to an appt. She uses transportation - cannot come today nor tomorrow; appt schedule 9/22 [facility].

## 2016-05-03 MED ORDER — DIPHENHYDRAMINE HCL 25 MG PO CAPS: 25.0000 mg | ORAL_CAPSULE | Freq: Every evening | ORAL | 0 refills | Status: DC | PRN

## 2016-05-09 ENCOUNTER — Telehealth: Payer: Self-pay | Admitting: Internal Medicine

## 2016-05-09 NOTE — Telephone Encounter (Signed)
APT. REMINDER CALL, NO ANSWER, NO VOICEMAIL °

## 2016-05-10 ENCOUNTER — Ambulatory Visit (HOSPITAL_COMMUNITY)
Admission: RE | Admit: 2016-05-10 | Discharge: 2016-05-10 | Disposition: A | Discharge status: Home / Self Care | Payer: Medicaid Other | Source: Ambulatory Visit | Attending: Internal Medicine | Admitting: Internal Medicine

## 2016-05-10 ENCOUNTER — Ambulatory Visit (INDEPENDENT_AMBULATORY_CARE_PROVIDER_SITE_OTHER): Payer: Medicaid Other | Admitting: Internal Medicine

## 2016-05-10 VITALS — BP 139/69 | HR 85 | Temp 97.5°F | Ht 61.0 in | Wt 177.5 lb

## 2016-05-10 DIAGNOSIS — M79674 Pain in right toe(s): Secondary | ICD-10-CM

## 2016-05-10 DIAGNOSIS — E11649 Type 2 diabetes mellitus with hypoglycemia without coma: Secondary | ICD-10-CM

## 2016-05-10 DIAGNOSIS — Z794 Long term (current) use of insulin: Secondary | ICD-10-CM | POA: Diagnosis not present

## 2016-05-10 DIAGNOSIS — E1100 Type 2 diabetes mellitus with hyperosmolarity without nonketotic hyperglycemic-hyperosmolar coma (NKHHC): Secondary | ICD-10-CM

## 2016-05-10 DIAGNOSIS — M79676 Pain in unspecified toe(s): Secondary | ICD-10-CM | POA: Insufficient documentation

## 2016-05-10 DIAGNOSIS — M19071 Primary osteoarthritis, right ankle and foot: Secondary | ICD-10-CM | POA: Diagnosis not present

## 2016-05-10 LAB — GLUCOSE, CAPILLARY
Glucose-Capillary: 40 mg/dL — CL (ref 65–99)
Glucose-Capillary: 98 mg/dL (ref 65–99)

## 2016-05-10 NOTE — Progress Notes (Signed)
Hypoglycemic Event  CBG: 40  Treatment: 3 glucose tabs  Symptoms: None  Follow-up CBG: Time 10:16 AM  BG Result: 98  Possible Reasons for Event: Inadequate meal intake  Comments/MD notified: Dr. Danny Lawless, Trey Sailors

## 2016-05-10 NOTE — Progress Notes (Signed)
Internal Medicine Clinic Attending  Case discussed with Dr. Wallace at the time of the visit.  We reviewed the resident's history and exam and pertinent patient test results.  I agree with the assessment, diagnosis, and plan of care documented in the resident's note.  

## 2016-05-10 NOTE — Assessment & Plan Note (Signed)
A: Toe pain since dropping a can of beans on it about 1 week ago.  No obvious deformities on exam.  Some decreased ROM with flexion and extension of her toe.  States it hurts to walk on it.  She has no wound or abrasion.  P: - will get a foot x-ray of the right foot - recommended rest, ice, and tylenol as needed for pain

## 2016-05-10 NOTE — Assessment & Plan Note (Signed)
A: Patient was hypoglycemic to 43 when she got to clinic.  Given 3 glucose tabs and repeat CBG was 98.  Although she is not supposed to be on insulin anymore she brought in her Lantus and Novolog.  Reports taking Lantus 15units nightly and Novolog 15 units in the morning.  She took 15 units this morning and had no breakfast.  She was asymptomatic with no lightheadedness, confusion, or tremor. She has not been taking metformin because it upsets her stomach and causes her ankles to swell.  P: - I have asked her to stop taking her insulin as it is concerning that she has come to clinic multiple times with hypoglycemia and even more so that she is asymptomatic at these times. - I have asked her to continue checking her blood sugars as she has been instructed to do on previous visits. - Instructed to bring meter to follow up appointment with her PCP in October so he can consider another oral medication as they have previously discussed. - Discontinued her metformin since she is not taking it.  Although she does not appear to have any swelling today in her ankles, I suggested that this was likely not due to the metformin and seems more likely due to her amlodipine if anything.  However, she did not agree with this and believes her swelling is better off metformin.

## 2016-05-10 NOTE — Progress Notes (Signed)
CC: here for right foot pain  HPI:  [name] is a 55 y.o. woman with a past medical history listed below here today for right foot pain.  She dropped a can of beans on her big toe about 1 week ago and it has hurt since.  She has not tried anything for relief.  She states it hurts to walk on.   She was also found to be hypoglycemic on presentation to the clinic at 15.  For details of today's visit and the status of her chronic medical issues please refer to the assessment and plan.   Past Medical History:  Diagnosis Date  . Diabetes mellitus without complication ([place])   . Hepatitis C, chronic ([place])   . Hyperlipidemia associated with type 2 diabetes mellitus ([place])   . Hypertension associated with diabetes ([place])   . Schizophrenia ([place])     Review of Systems:   Please see pertinent ROS reviewed in HPI and problem based charting.   Physical Exam:  Vitals:   05/10/16 0952  BP: 139/69  Pulse: 85  Temp: 97.5 F (36.4 C)  TempSrc: Oral  SpO2: 97%  Weight: 177 lb 8 oz (80.5 kg)  Height: 5\' 1"  (1.549 m)   Physical Exam  Constitutional: She is oriented to person, place, and time and well-developed, well-nourished, and in no distress.  HENT:  Head: Normocephalic and atraumatic.  Pulmonary/Chest: Effort normal.  Musculoskeletal: She exhibits no edema.  No obvious trauma to her right foot or great toe. Tender to palpation over the first MCP joint and decreased ROM due to pain.  Neurological: She is alert and oriented to person, place, and time.     Assessment & Plan:   See Encounters Tab for problem based charting.  Patient discussed with Dr. Angelia Mould.  Type 2 diabetes mellitus ([facility]) A: Patient was hypoglycemic to 43 when she got to clinic.  Given 3 glucose tabs and repeat CBG was 98.  Although she is not supposed to be on insulin anymore she brought in her Lantus and Novolog.  Reports taking Lantus 15units nightly and Novolog 15 units in the morning.  She took  15 units this morning and had no breakfast.  She was asymptomatic with no lightheadedness, confusion, or tremor. She has not been taking metformin because it upsets her stomach and causes her ankles to swell.  P: - I have asked her to stop taking her insulin as it is concerning that she has come to clinic multiple times with hypoglycemia and even more so that she is asymptomatic at these times. - I have asked her to continue checking her blood sugars as she has been instructed to do on previous visits. - Instructed to bring meter to follow up appointment with her PCP in October so he can consider another oral medication as they have previously discussed. - Discontinued her metformin since she is not taking it.  Although she does not appear to have any swelling today in her ankles, I suggested that this was likely not due to the metformin and seems more likely due to her amlodipine if anything.  However, she did not agree with this and believes her swelling is better off metformin.  Great toe pain A: Toe pain since dropping a can of beans on it about 1 week ago.  No obvious deformities on exam.  Some decreased ROM with flexion and extension of her toe.  States it hurts to walk on it.  She has no  wound or abrasion.  P: - will get a foot x-ray of the right foot - recommended rest, ice, and tylenol as needed for pain

## 2016-05-10 NOTE — Patient Instructions (Addendum)
Please stop taking your insulin.  Please keep checking your blood sugars twice per day while we monitor your blood sugar off the insulin.  I have removed metformin from your medication list since you are not taking it.  For your ankle swelling, please use compression stockings and elevate your feet.  Keep an eye on this over the next few weeks.  I have ordered an x-ray of your right foot.  Follow up with Dr. Charlynn Grimes next month.

## 2016-05-14 ENCOUNTER — Telehealth: Payer: Self-pay | Admitting: Internal Medicine

## 2016-05-14 NOTE — Telephone Encounter (Signed)
Pt would like a call back about her XRAY.

## 2016-05-15 NOTE — Telephone Encounter (Signed)
Called patient and discussed results. Thanks 

## 2016-05-28 ENCOUNTER — Telehealth: Payer: Self-pay | Admitting: Internal Medicine

## 2016-05-28 DIAGNOSIS — K219 Gastro-esophageal reflux disease without esophagitis: Secondary | ICD-10-CM

## 2016-05-28 NOTE — Telephone Encounter (Signed)
°  diphenhydrAMINE (BENADRYL) 25 mg capsule atorvastatin (LIPITOR) 40 MG tablet  Pt states she needs all medications  Pt confused on getting them from pharmacy.

## 2016-05-28 NOTE — Telephone Encounter (Signed)
Pt calls again and is very confused about her medications, she is looking at meds she picked up today from pharm and does not understand why she cant get everything at the same time, she is talking about rude people and people that dont know what they are doing, she states she was given 90 gabapentin but only takes 2 a day when she needs them, she was ask to speak to her md about this problem when she has her next appt. She states that [facility] wants [facility] and then says "i got to be leaving these 2 momas" and hangs up

## 2016-05-28 NOTE — Telephone Encounter (Signed)
Sent refill request of needed meds to md, the ones she ask for have refills She c/o "those people answering the phone are rude to me", sent to [facility]., reassured pt that it would be addressed

## 2016-05-29 ENCOUNTER — Encounter: Payer: Self-pay | Admitting: Podiatry

## 2016-05-29 ENCOUNTER — Ambulatory Visit (INDEPENDENT_AMBULATORY_CARE_PROVIDER_SITE_OTHER): Payer: Medicaid Other | Admitting: Podiatry

## 2016-05-29 ENCOUNTER — Ambulatory Visit (INDEPENDENT_AMBULATORY_CARE_PROVIDER_SITE_OTHER): Payer: Medicaid Other

## 2016-05-29 VITALS — BP 144/89 | HR 95 | Resp 18

## 2016-05-29 DIAGNOSIS — M7750 Other enthesopathy of unspecified foot: Secondary | ICD-10-CM

## 2016-05-29 DIAGNOSIS — M778 Other enthesopathies, not elsewhere classified: Secondary | ICD-10-CM

## 2016-05-29 DIAGNOSIS — M79674 Pain in right toe(s): Secondary | ICD-10-CM

## 2016-05-29 DIAGNOSIS — M722 Plantar fascial fibromatosis: Secondary | ICD-10-CM | POA: Diagnosis not present

## 2016-05-29 DIAGNOSIS — M779 Enthesopathy, unspecified: Secondary | ICD-10-CM

## 2016-05-29 MED ORDER — OMEPRAZOLE 20 MG PO CPDR: 20.0000 mg | DELAYED_RELEASE_CAPSULE | Freq: Every day | ORAL | 2 refills | Status: DC

## 2016-05-29 NOTE — Progress Notes (Signed)
   Subjective:    Patient ID: Kayla Gregory, female    DOB: 07/02/1961, 56 y.o.   MRN: [id number]  HPI    Review of Systems  All other systems reviewed and are negative.      Objective:   Physical Exam        Assessment & Plan:

## 2016-05-29 NOTE — Telephone Encounter (Signed)
For the Geodon and Fluoxetine, there is no psychiatric diagnosis on her problem list and I can only find one mention of a phychiatric diagnosis on chart review. I do see a refill Rx for these medications being filled on 02/16/16 by Dr. Darnell Level but cannot find any other time we have filled these medications. Geodon is not something we commonly prescribe and we would usually refer someone to psych if they are on these types of medications. I do not feel comfortable prescribing her these medications especially without a diagnosis. I suspect she is followed by psych and would need to get these medications from them. Can we confirm this with her.   Thanks

## 2016-05-31 NOTE — Telephone Encounter (Signed)
There has never been a discussion about those medications or why she is on them since she has been in our clinic. I am not sure who has prescribed them for her or her diagnosis that would require them. The Geodon is not something we typically do in our clinic. She would need to be seen and evaluated before I would feel comfortable prescribing that particular medication and would like for her to see Psych for continued therapy and management of those medications. Moreover, the only time I can see we refilled it was 2.5 months ago so it is not even clear if she is taking the medication.

## 2016-05-31 NOTE — Telephone Encounter (Signed)
She stated that she had been told she needed to see someone but she does not feel she needs to at this time

## 2016-06-03 MED ORDER — BETAMETHASONE SOD PHOS & ACET 6 (3-3) MG/ML IJ SUSP: 12.0000 mg | Freq: Once | INTRAMUSCULAR | Status: DC

## 2016-06-03 NOTE — Progress Notes (Signed)
Patient ID: Kayla Gregory, female   DOB: [birth year], 55 y.o.   MRN: [id number] Subjective:  Patient with a history of diabetes presents to the office today for, to her right great toe. Patient states she's dropped a can of beans on her foot approximately 3 weeks ago. Patient presents today for further treatment and evaluation    Objective/Physical Exam General: The patient is alert and oriented x3 in no acute distress.  Dermatology: Skin is warm, dry and supple bilateral lower extremities. Negative for open lesions or macerations.  Vascular: Palpable pedal pulses bilaterally. No edema or erythema noted. Capillary refill within normal limits.  Neurological: Epicritic and protective threshold grossly intact bilaterally.   Musculoskeletal Exam: Pain on palpation and range of motion to the first MPJ right foot. Range of motion within normal limits to all pedal and ankle joints bilateral. Muscle strength 5/5 in all groups bilateral.   Radiographic Exam:  Normal osseous mineralization. Joint spaces preserved. No fracture/dislocation/boney destruction.    Assessment: #1 trauma right first MPJ #2 capsulitis first MPJ right foot #3 DJD first MPJ right foot #4 pain in right foot #5 diabetes mellitus   Plan of Care:  #1 Patient was evaluated. #2 today injection of 0.5 mL Celestone Soluspan injected in the patient's right first MPJ #3 patient is to return to clinic when necessary   Dr. Edrick Kins, [place]

## 2016-06-04 ENCOUNTER — Telehealth: Payer: Self-pay | Admitting: Internal Medicine

## 2016-06-04 NOTE — Telephone Encounter (Signed)
T. REMINDER CALL, LMTCB

## 2016-06-05 ENCOUNTER — Encounter: Payer: Medicaid Other | Admitting: Internal Medicine

## 2016-06-26 ENCOUNTER — Ambulatory Visit (INDEPENDENT_AMBULATORY_CARE_PROVIDER_SITE_OTHER): Payer: Medicaid Other | Admitting: Podiatry

## 2016-06-26 DIAGNOSIS — M2041 Other hammer toe(s) (acquired), right foot: Secondary | ICD-10-CM

## 2016-06-26 DIAGNOSIS — M21612 Bunion of left foot: Secondary | ICD-10-CM

## 2016-06-26 DIAGNOSIS — M216X1 Other acquired deformities of right foot: Secondary | ICD-10-CM

## 2016-06-26 DIAGNOSIS — M7751 Other enthesopathy of right foot: Secondary | ICD-10-CM

## 2016-06-26 DIAGNOSIS — M25572 Pain in left ankle and joints of left foot: Secondary | ICD-10-CM

## 2016-06-26 DIAGNOSIS — M2012 Hallux valgus (acquired), left foot: Secondary | ICD-10-CM

## 2016-06-26 DIAGNOSIS — M21611 Bunion of right foot: Principal | ICD-10-CM

## 2016-06-26 DIAGNOSIS — M25571 Pain in right ankle and joints of right foot: Secondary | ICD-10-CM

## 2016-06-26 DIAGNOSIS — M2042 Other hammer toe(s) (acquired), left foot: Secondary | ICD-10-CM

## 2016-06-26 DIAGNOSIS — M2011 Hallux valgus (acquired), right foot: Secondary | ICD-10-CM

## 2016-06-30 MED ORDER — BETAMETHASONE SOD PHOS & ACET 6 (3-3) MG/ML IJ SUSP: 3.0000 mg | Freq: Once | INTRAMUSCULAR | Status: DC

## 2016-06-30 NOTE — Progress Notes (Signed)
Subjective:  Patient presents today for follow-up evaluation of pain and tenderness to the right great toe. Patient does have a history of bunion deformity bilaterally, more symptomatic on the right. Approximately 2 months ago she states that she dropped a can of beans on her right foot. She states that that pain is no longer present and she is currently having pain on her bunion. Patient presents today for further treatment and evaluation    Objective/Physical Exam General: The patient is alert and oriented x3 in no acute distress.  Dermatology: Painful hyperkeratotic lesion noted to the left great toe. Skin is warm, dry and supple bilateral lower extremities. Negative for open lesions or macerations.  Vascular: Palpable pedal pulses bilaterally. No edema or erythema noted. Capillary refill within normal limits.  Neurological: Epicritic and protective threshold grossly intact bilaterally.   Musculoskeletal Exam: Clinical evidence of symptomatic hallux abductovalgus with bunion deformity noted bilaterally, greater on the right foot. Pain on palpation and range of motion noted to the first MPJ right foot. Painful adductovarus deformity also noted to the bilateral fifth digits with overlying callus formation at the level of the PIPJ.  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 All patient questions were answered regarding conservative and surgical management. Today the patient is going to for conservative management. If patient does not feel better with conservative modalities we may  proceed with surgical intervention which would include bunionectomy with first metatarsal osteotomy right foot and PIPJ derotational arthroplasties bilateral fifth digits. #3 injection of 0.5 mL Celestone Soluspan injected in the first MPJ of the right foot #4 return to clinic in 4 weeks  Authorization for surgery next visit: - Bunionectomy with first metatarsal osteotomy right - PIPJ derotational arthroplasty fifth digits bilateral   Dr. Edrick Kins, [place]

## 2016-07-03 ENCOUNTER — Other Ambulatory Visit: Payer: Self-pay | Admitting: Internal Medicine

## 2016-07-08 ENCOUNTER — Telehealth: Payer: Self-pay | Admitting: Internal Medicine

## 2016-07-08 NOTE — Telephone Encounter (Signed)
I read last note and it stated insulin was stopped I called pt to ask what kind of needles she needed She stated she needed insulin needles I ask what kind of insulin she was taking and she answered lantus and novolog, she also stated a dr's office should know what she takes, I stated according to her last visit she should not be taking insulin, she became very angry and stated her blood sugar is hi now, I offered an appt, she refused and stated she shouldn't be made to wait she should have her needles, she stated she thinks  wants her to die, I informed her I would send the dr a note and we also needed to schedule an appt, she hung up

## 2016-07-08 NOTE — Telephone Encounter (Signed)
Needs refills for insulin needles,

## 2016-07-09 ENCOUNTER — Telehealth: Payer: Self-pay

## 2016-07-09 NOTE — Telephone Encounter (Signed)
Her insulin has been stopped. She has had frequent hypoglycemia at office visits. Will not refill insulin needles as she should not be taking any insulin currently. She needs to come to clinic for evaluation at the next available appointment or go to the ED for evaluation if she is unwilling to see [place].

## 2016-07-09 NOTE — Telephone Encounter (Signed)
Pt calling back today for insulin needles rx.; states blood sugar was 475 yesterday and 425 this morning.  Pt very upset; stating we are trying to help her. Informed her we will send message to her doctor.

## 2016-07-09 NOTE — Telephone Encounter (Signed)
Patient called to get insulin needle RX explained to patient MD stated she should not be taking insulin due to hypoglycemia she needs to come in for an evaluation or go to the Ed. Patient stated she will buy he own needles and will continue to take insulin because her sugar is too high, stated she will not ever come here for care will find new doctor and she is not going to go ED

## 2016-07-15 NOTE — Telephone Encounter (Signed)
Pt was informed of this and she hung up stating she will find a new md

## 2016-07-24 ENCOUNTER — Ambulatory Visit: Payer: Medicaid Other | Admitting: Podiatry

## 2016-07-29 ENCOUNTER — Ambulatory Visit: Payer: Medicaid Other | Admitting: Podiatry

## 2016-08-05 ENCOUNTER — Ambulatory Visit (INDEPENDENT_AMBULATORY_CARE_PROVIDER_SITE_OTHER): Payer: Medicaid Other | Admitting: Podiatry

## 2016-08-05 DIAGNOSIS — M2041 Other hammer toe(s) (acquired), right foot: Secondary | ICD-10-CM

## 2016-08-05 DIAGNOSIS — M25571 Pain in right ankle and joints of right foot: Secondary | ICD-10-CM

## 2016-08-05 DIAGNOSIS — M7751 Other enthesopathy of right foot: Secondary | ICD-10-CM

## 2016-08-05 DIAGNOSIS — M21612 Bunion of left foot: Secondary | ICD-10-CM

## 2016-08-05 DIAGNOSIS — M2042 Other hammer toe(s) (acquired), left foot: Secondary | ICD-10-CM

## 2016-08-05 DIAGNOSIS — M722 Plantar fascial fibromatosis: Secondary | ICD-10-CM | POA: Diagnosis not present

## 2016-08-05 DIAGNOSIS — M79674 Pain in right toe(s): Secondary | ICD-10-CM

## 2016-08-05 DIAGNOSIS — M2011 Hallux valgus (acquired), right foot: Secondary | ICD-10-CM

## 2016-08-05 DIAGNOSIS — M2012 Hallux valgus (acquired), left foot: Secondary | ICD-10-CM

## 2016-08-05 DIAGNOSIS — M25572 Pain in left ankle and joints of left foot: Secondary | ICD-10-CM

## 2016-08-05 DIAGNOSIS — M21611 Bunion of right foot: Principal | ICD-10-CM

## 2016-08-05 DIAGNOSIS — M65332 Trigger finger, left middle finger: Secondary | ICD-10-CM

## 2016-08-05 MED ORDER — BETAMETHASONE SOD PHOS & ACET 6 (3-3) MG/ML IJ SUSP: 3.0000 mg | Freq: Once | INTRAMUSCULAR | Status: DC

## 2016-08-05 NOTE — Progress Notes (Signed)
Subjective:  Patient presents today for follow-up evaluation of pain and tenderness to the right great toe. Patient does have a history of bunion deformity bilaterally, more symptomatic on the right. Approximately 2 months ago she states that she dropped a can of beans on her right foot. She states that that pain is no longer present and she is currently having pain on her bunion. Patient presents today for further treatment and evaluation Patient also has a new complaint today of a trigger finger chisel left hand third digit. Patient states that it locks with range of motion.    Objective/Physical Exam General: The patient is alert and oriented x3 in no acute distress.  Dermatology: Painful hyperkeratotic lesion noted to the left great toe. Skin is warm, dry and supple bilateral lower extremities. Negative for open lesions or macerations.  Vascular: Palpable pedal pulses bilaterally. No edema or erythema noted. Capillary refill within normal limits.  Neurological: Epicritic and protective threshold grossly intact bilaterally.   Musculoskeletal Exam: Clinical evidence of symptomatic hallux abductovalgus with bunion deformity noted bilaterally, greater on the right foot. Pain on palpation and range of motion noted to the first MPJ right foot. Painful adductovarus deformity also noted to the bilateral fifth digits with overlying callus formation at the level of the PIPJ.  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 All patient questions were answered regarding conservative and surgical  management. Today the patient is going to for conservative management. If patient does not feel better with conservative modalities we may proceed with surgical intervention which would include bunionectomy with first metatarsal osteotomy right foot and PIPJ derotational arthroplasties bilateral fifth digits. #3 injection of 0.5 mL Celestone Soluspan injected in the first MPJ of the right foot #4 consult referral for trigger finger third digit left hand  #5 return to clinic in 4 weeks  Authorization for surgery next visit: - Bunionectomy with first metatarsal osteotomy right - PIPJ derotational arthroplasty fifth digits bilateral   Dr. Edrick Kins, [place]

## 2016-08-06 ENCOUNTER — Telehealth: Payer: Self-pay | Admitting: Internal Medicine

## 2016-08-06 ENCOUNTER — Telehealth: Payer: Self-pay | Admitting: *Deleted

## 2016-08-06 NOTE — Telephone Encounter (Signed)
APT. REMINDER CALL, LMTCB °

## 2016-08-06 NOTE — Telephone Encounter (Addendum)
-----   Message from Edrick Kins, DPM sent at 08/05/2016  6:19 PM EST ----- Regarding: Ortho consult Please consult for ortho.   Dx : trigger finger 3rd digit left hand.   Dr. Amalia Hailey. 08/06/2016-I spoke with Elmarie Shiley and she states referrals for Medicaid must come from the PCP. Pt states her PCP is DR. Maryellen Pile. I spoke with the receptionist - [facility] and she states send a note for referral and clinicals and Dr. Charlynn Grimes will make the referral to orthopedics. Note for referral to orthopedics, demographics and clinicals faxed to Dr. Shanna Cisco office.

## 2016-08-07 ENCOUNTER — Ambulatory Visit (INDEPENDENT_AMBULATORY_CARE_PROVIDER_SITE_OTHER): Payer: Medicaid Other | Admitting: Internal Medicine

## 2016-08-07 VITALS — BP 139/97 | HR 91 | Temp 98.6°F | Ht 61.0 in | Wt 178.0 lb

## 2016-08-07 DIAGNOSIS — E1100 Type 2 diabetes mellitus with hyperosmolarity without nonketotic hyperglycemic-hyperosmolar coma (NKHHC): Secondary | ICD-10-CM

## 2016-08-07 DIAGNOSIS — E1165 Type 2 diabetes mellitus with hyperglycemia: Secondary | ICD-10-CM

## 2016-08-07 DIAGNOSIS — Z794 Long term (current) use of insulin: Secondary | ICD-10-CM

## 2016-08-07 LAB — GLUCOSE, CAPILLARY: Glucose-Capillary: 409 mg/dL — ABNORMAL HIGH (ref 65–99)

## 2016-08-07 LAB — POCT GLYCOSYLATED HEMOGLOBIN (HGB A1C): HEMOGLOBIN A1C: 8.6

## 2016-08-07 LAB — BASIC METABOLIC PANEL
Anion gap: 9 (ref 5–15)
BUN: 24 mg/dL — AB (ref 6–20)
CHLORIDE: 100 mmol/L — AB (ref 101–111)
CO2: 26 mmol/L (ref 22–32)
Calcium: 9.3 mg/dL (ref 8.9–10.3)
Creatinine, Ser: 1.51 mg/dL — ABNORMAL HIGH (ref 0.44–1.00)
GFR calc Af Amer: 43 mL/min — ABNORMAL LOW (ref >60)
GFR calc non Af Amer: 37 mL/min — ABNORMAL LOW (ref >60)
Glucose, Bld: 435 mg/dL — ABNORMAL HIGH (ref 65–99)
POTASSIUM: 4.5 mmol/L (ref 3.5–5.1)
SODIUM: 135 mmol/L (ref 135–145)

## 2016-08-07 LAB — POCT URINALYSIS DIPSTICK
Bilirubin, UA: NEGATIVE
Glucose, UA: 500
Ketones, UA: NEGATIVE
Leukocytes, UA: NEGATIVE
Nitrite, UA: NEGATIVE
PH UA: 5.5
SPEC GRAV UA: 1.02
UROBILINOGEN UA: 0.2

## 2016-08-07 MED ORDER — NOVOLOG 100 UNIT/ML ~~LOC~~ SOLN: 10.0000 [IU] | Freq: Three times a day (TID) | SUBCUTANEOUS | 5 refills | Status: DC

## 2016-08-07 MED ORDER — INSULIN GLARGINE 100 UNIT/ML ~~LOC~~ SOLN: 25.0000 [IU] | Freq: Every day | SUBCUTANEOUS | 3 refills | Status: DC

## 2016-08-07 MED ORDER — LIRAGLUTIDE 18 MG/3ML ~~LOC~~ SOPN: 0.6000 mg | PEN_INJECTOR | Freq: Every day | SUBCUTANEOUS | 2 refills | Status: DC

## 2016-08-07 MED ORDER — ACCU-CHEK AVIVA PLUS W/DEVICE KIT: 1.0000 "application " | PACK | Freq: Three times a day (TID) | 0 refills | Status: DC

## 2016-08-07 MED ORDER — INSULIN STARTER KIT- PEN NEEDLES (ENGLISH): 1.0000 | Freq: Once | Status: AC
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
## 2016-08-07 NOTE — Patient Instructions (Addendum)
Kayla Gregory,  Continue taking the Lantus 25 unit every night before bed. Instead of taking the Humalog 25 units in the morning, please take 10 units with meals (breakfast, lunch and dinner). I also going to start you on a new medication called Victoza. You can take that in the morning with breakast. Take 0.6 for one week. After a week increase to 1.2. If you have any problems with the pharmacy please let us know.  Please check your blood sugars 4 times a day (with meals and at night).   I would like to see you back in 2 weeks for follow up. If you notice your sugars are running low (less than 100) or you feel bad from your sugars being low please call the clinic.

## 2016-08-08 ENCOUNTER — Other Ambulatory Visit: Payer: Self-pay

## 2016-08-08 LAB — URINALYSIS, ROUTINE W REFLEX MICROSCOPIC
BILIRUBIN UA: NEGATIVE
KETONES UA: NEGATIVE
LEUKOCYTES UA: NEGATIVE
NITRITE UA: NEGATIVE
SPEC GRAV UA: 1.027 (ref 1.005–1.030)
Urobilinogen, Ur: 0.2 mg/dL (ref 0.2–1.0)
pH, UA: 5 (ref 5.0–7.5)

## 2016-08-08 LAB — MICROSCOPIC EXAMINATION
Casts: NONE SEEN /lpf
RBC, UA: >30 /hpf — AB (ref 0–?)

## 2016-08-08 LAB — MICROALBUMIN / CREATININE URINE RATIO
Creatinine, Urine: 91.1 mg/dL
Microalb/Creat Ratio: 2265.8 mg/g creat — ABNORMAL HIGH (ref 0.0–30.0)
Microalbumin, Urine: 2064.1 ug/mL

## 2016-08-08 NOTE — Progress Notes (Signed)
Internal Medicine Clinic Attending  Case discussed with Dr. Boswell at the time of the visit.  We reviewed the resident's history and exam and pertinent patient test results.  I agree with the assessment, diagnosis, and plan of care documented in the resident's note.  

## 2016-08-08 NOTE — Telephone Encounter (Signed)
Pharmacy reports patient requested call [facility] for prior authorization pharmacy staff reports prior authorization form for victoza already faxed

## 2016-08-08 NOTE — Telephone Encounter (Signed)
Kayla Gregory from [place] needs to speak with a nurse regarding med.

## 2016-08-09 MED ORDER — EXENATIDE 5 MCG/0.02ML ~~LOC~~ SOPN: 5.0000 ug | PEN_INJECTOR | Freq: Two times a day (BID) | SUBCUTANEOUS | 1 refills | Status: DC

## 2016-08-09 NOTE — Addendum Note (Signed)
Addended by: Alcolu Lions on: 08/09/2016 10:16 PM   Modules accepted: Orders

## 2016-08-15 ENCOUNTER — Other Ambulatory Visit: Payer: Self-pay | Admitting: *Deleted

## 2016-08-15 NOTE — Telephone Encounter (Signed)
Dr Shanna Cisco note stated "Insurance will not cover Victoza. Will order Byetta 5 mcg bid." Which med does she want?

## 2016-08-20 MED ORDER — PEN NEEDLES 31G X 5 MM MISC: 1.0000 "application " | Freq: Two times a day (BID) | 3 refills | Status: DC

## 2016-09-03 ENCOUNTER — Encounter (HOSPITAL_COMMUNITY): Payer: Self-pay | Admitting: Emergency Medicine

## 2016-09-03 ENCOUNTER — Emergency Department (HOSPITAL_COMMUNITY)
Admission: EM | Admit: 2016-09-03 | Discharge: 2016-09-03 | Disposition: A | Discharge status: Home / Self Care | Payer: Medicaid Other | Attending: Emergency Medicine | Admitting: Emergency Medicine

## 2016-09-03 DIAGNOSIS — R05 Cough: Secondary | ICD-10-CM | POA: Insufficient documentation

## 2016-09-03 DIAGNOSIS — Z7982 Long term (current) use of aspirin: Secondary | ICD-10-CM | POA: Insufficient documentation

## 2016-09-03 DIAGNOSIS — I10 Essential (primary) hypertension: Secondary | ICD-10-CM | POA: Insufficient documentation

## 2016-09-03 DIAGNOSIS — Z87891 Personal history of nicotine dependence: Secondary | ICD-10-CM | POA: Diagnosis not present

## 2016-09-03 DIAGNOSIS — R197 Diarrhea, unspecified: Secondary | ICD-10-CM | POA: Insufficient documentation

## 2016-09-03 DIAGNOSIS — R059 Cough, unspecified: Secondary | ICD-10-CM

## 2016-09-03 DIAGNOSIS — R6889 Other general symptoms and signs: Secondary | ICD-10-CM

## 2016-09-03 DIAGNOSIS — Z794 Long term (current) use of insulin: Secondary | ICD-10-CM | POA: Insufficient documentation

## 2016-09-03 DIAGNOSIS — Z96642 Presence of left artificial hip joint: Secondary | ICD-10-CM | POA: Insufficient documentation

## 2016-09-03 DIAGNOSIS — Z79899 Other long term (current) drug therapy: Secondary | ICD-10-CM | POA: Diagnosis not present

## 2016-09-03 DIAGNOSIS — R112 Nausea with vomiting, unspecified: Secondary | ICD-10-CM | POA: Insufficient documentation

## 2016-09-03 DIAGNOSIS — E119 Type 2 diabetes mellitus without complications: Secondary | ICD-10-CM | POA: Insufficient documentation

## 2016-09-03 LAB — CBC WITH DIFFERENTIAL/PLATELET
Basophils Absolute: 0 10*3/uL (ref 0.0–0.1)
Basophils Relative: 0 %
EOS ABS: 0 10*3/uL (ref 0.0–0.7)
EOS PCT: 0 %
HCT: 35.6 % — ABNORMAL LOW (ref 36.0–46.0)
Hemoglobin: 12.6 g/dL (ref 12.0–15.0)
LYMPHS ABS: 1.8 10*3/uL (ref 0.7–4.0)
Lymphocytes Relative: 32 %
MCH: 30.6 pg (ref 26.0–34.0)
MCHC: 35.4 g/dL (ref 30.0–36.0)
MCV: 86.4 fL (ref 78.0–100.0)
Monocytes Absolute: 0.5 10*3/uL (ref 0.1–1.0)
Monocytes Relative: 9 %
Neutro Abs: 3.3 10*3/uL (ref 1.7–7.7)
Neutrophils Relative %: 59 %
PLATELETS: 151 10*3/uL (ref 150–400)
RBC: 4.12 MIL/uL (ref 3.87–5.11)
RDW: 12.3 % (ref 11.5–15.5)
WBC: 5.6 10*3/uL (ref 4.0–10.5)

## 2016-09-03 LAB — COMPREHENSIVE METABOLIC PANEL
ALT: 30 U/L (ref 14–54)
ANION GAP: 10 (ref 5–15)
AST: 34 U/L (ref 15–41)
Albumin: 3 g/dL — ABNORMAL LOW (ref 3.5–5.0)
Alkaline Phosphatase: 95 U/L (ref 38–126)
BUN: 19 mg/dL (ref 6–20)
CHLORIDE: 101 mmol/L (ref 101–111)
CO2: 23 mmol/L (ref 22–32)
CREATININE: 1.36 mg/dL — AB (ref 0.44–1.00)
Calcium: 8.2 mg/dL — ABNORMAL LOW (ref 8.9–10.3)
GFR calc non Af Amer: 42 mL/min — ABNORMAL LOW (ref >60)
GFR, EST AFRICAN AMERICAN: 49 mL/min — AB (ref >60)
Glucose, Bld: 361 mg/dL — ABNORMAL HIGH (ref 65–99)
POTASSIUM: 3.6 mmol/L (ref 3.5–5.1)
SODIUM: 134 mmol/L — AB (ref 135–145)
Total Bilirubin: 0.6 mg/dL (ref 0.3–1.2)
Total Protein: 6.3 g/dL — ABNORMAL LOW (ref 6.5–8.1)

## 2016-09-03 LAB — URINALYSIS, ROUTINE W REFLEX MICROSCOPIC
BILIRUBIN URINE: NEGATIVE
KETONES UR: 5 mg/dL — AB
LEUKOCYTES UA: NEGATIVE
NITRITE: NEGATIVE
PH: 5 (ref 5.0–8.0)
PROTEIN: 100 mg/dL — AB
Specific Gravity, Urine: 1.017 (ref 1.005–1.030)

## 2016-09-03 MED ORDER — ONDANSETRON 4 MG PO TBDP: 4.0000 mg | ORAL_TABLET | Freq: Three times a day (TID) | ORAL | 0 refills | Status: DC | PRN

## 2016-09-03 MED ORDER — ONDANSETRON HCL 4 MG/2ML IJ SOLN
4.0000 mg | Freq: Once | INTRAMUSCULAR | Status: AC
  Administered 2016-09-03: 4 mg via INTRAVENOUS
  Filled 2016-09-03: qty 2

## 2016-09-03 MED ORDER — SODIUM CHLORIDE 0.9 % IV BOLUS (SEPSIS)
1000.0000 mL | Freq: Once | INTRAVENOUS | Status: AC
  Administered 2016-09-03: 1000 mL via INTRAVENOUS

## 2016-09-03 NOTE — ED Notes (Signed)
CBG 321 

## 2016-09-03 NOTE — ED Triage Notes (Signed)
Pt reports sugars ok before had 4 days of nausea, vomiting, diarr, and has not taken insulin. CBG high in 400s per EMS. EMS started bolus and gave 4 zofran. Last bout of emesis this morning. Not dialysis patient or heart failure patient.

## 2016-09-03 NOTE — ED Notes (Signed)
Pt verbalized understanding of d/c instructions and has no further questions. Pt stable and NAD. VSS. Pt given cab voucher to get home.
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
## 2016-09-04 LAB — CBG MONITORING, ED: Glucose-Capillary: 321 mg/dL — ABNORMAL HIGH (ref 65–99)

## 2016-09-19 ENCOUNTER — Telehealth: Payer: Self-pay | Admitting: Internal Medicine

## 2016-09-19 NOTE — Telephone Encounter (Signed)
Rec'd message from the [facility]  Per Dr Amalia Hailey please place a referral to an orthopedic Physician for evaluation and treatment of Left 3rd digit of hand or trigger finger.  Please see OV note on 08/05/2017 from the [facility] Dr. Amalia Hailey.  Please advise

## 2016-09-19 NOTE — Telephone Encounter (Signed)
We can evaluate and treat this in clinic. No need for referral to ortho. Please schedule an appointment in [facility] when Dr. Evette Doffing will be there for evaluation and possible steroid injeciton.

## 2016-10-03 NOTE — Telephone Encounter (Signed)
FYI: PT IS [place] ON 10/07/2016 IN THE [facility] TO BE SEEN

## 2016-10-07 ENCOUNTER — Ambulatory Visit (HOSPITAL_COMMUNITY)
Admission: RE | Admit: 2016-10-07 | Discharge: 2016-10-07 | Disposition: A | Discharge status: Home / Self Care | Payer: Medicaid Other | Source: Ambulatory Visit | Attending: Internal Medicine | Admitting: Internal Medicine

## 2016-10-07 ENCOUNTER — Encounter: Payer: Self-pay | Admitting: Dietician

## 2016-10-07 ENCOUNTER — Ambulatory Visit (INDEPENDENT_AMBULATORY_CARE_PROVIDER_SITE_OTHER): Payer: Medicaid Other | Admitting: Pulmonary Disease

## 2016-10-07 ENCOUNTER — Ambulatory Visit: Payer: Medicaid Other | Admitting: Dietician

## 2016-10-07 VITALS — BP 115/74 | HR 87 | Temp 98.3°F | Wt 166.3 lb

## 2016-10-07 DIAGNOSIS — R079 Chest pain, unspecified: Secondary | ICD-10-CM | POA: Insufficient documentation

## 2016-10-07 DIAGNOSIS — K219 Gastro-esophageal reflux disease without esophagitis: Secondary | ICD-10-CM

## 2016-10-07 DIAGNOSIS — Z9114 Patient's other noncompliance with medication regimen: Secondary | ICD-10-CM | POA: Diagnosis not present

## 2016-10-07 DIAGNOSIS — M65332 Trigger finger, left middle finger: Secondary | ICD-10-CM | POA: Diagnosis not present

## 2016-10-07 DIAGNOSIS — Z794 Long term (current) use of insulin: Secondary | ICD-10-CM

## 2016-10-07 DIAGNOSIS — Z87891 Personal history of nicotine dependence: Secondary | ICD-10-CM | POA: Diagnosis not present

## 2016-10-07 DIAGNOSIS — M6530 Trigger finger, unspecified finger: Secondary | ICD-10-CM | POA: Insufficient documentation

## 2016-10-07 DIAGNOSIS — E1100 Type 2 diabetes mellitus with hyperosmolarity without nonketotic hyperglycemic-hyperosmolar coma (NKHHC): Secondary | ICD-10-CM

## 2016-10-07 DIAGNOSIS — E1165 Type 2 diabetes mellitus with hyperglycemia: Secondary | ICD-10-CM

## 2016-10-07 LAB — BASIC METABOLIC PANEL
ANION GAP: 14 (ref 5–15)
BUN: 23 mg/dL — AB (ref 6–20)
CHLORIDE: 90 mmol/L — AB (ref 101–111)
CO2: 25 mmol/L (ref 22–32)
Calcium: 9.5 mg/dL (ref 8.9–10.3)
Creatinine, Ser: 1.58 mg/dL — ABNORMAL HIGH (ref 0.44–1.00)
GFR calc Af Amer: 41 mL/min — ABNORMAL LOW (ref >60)
GFR, EST NON AFRICAN AMERICAN: 35 mL/min — AB (ref >60)
Glucose, Bld: 618 mg/dL (ref 65–99)
POTASSIUM: 4.2 mmol/L (ref 3.5–5.1)
SODIUM: 129 mmol/L — AB (ref 135–145)

## 2016-10-07 LAB — GLUCOSE, CAPILLARY
GLUCOSE-CAPILLARY: 508 mg/dL — AB (ref 65–99)
GLUCOSE-CAPILLARY: 495 mg/dL — AB (ref 65–99)
Glucose-Capillary: >600 mg/dL (ref 65–99)

## 2016-10-07 MED ORDER — INSULIN GLARGINE 100 UNIT/ML ~~LOC~~ SOLN: 30.0000 [IU] | Freq: Every day | SUBCUTANEOUS | 3 refills | Status: DC

## 2016-10-07 MED ORDER — INSULIN ASPART 100 UNIT/ML ~~LOC~~ SOLN
12.0000 [IU] | Freq: Once | SUBCUTANEOUS | Status: AC
  Administered 2016-10-07: 12 [IU] via SUBCUTANEOUS

## 2016-10-07 MED ORDER — SODIUM CHLORIDE 0.9 % IV BOLUS (SEPSIS)
1000.0000 mL | Freq: Once | INTRAVENOUS | Status: AC
  Administered 2016-10-07: 1000 mL via INTRAVENOUS

## 2016-10-07 MED ORDER — PANTOPRAZOLE SODIUM 40 MG PO TBEC
40.0000 mg | DELAYED_RELEASE_TABLET | Freq: Every day | ORAL | 1 refills | Status: DC
Start: 2016-10-07 — End: 2016-11-13

## 2016-10-07 NOTE — Assessment & Plan Note (Signed)
Currently uncontrolled. Not taking omeprazole that is listed on her med list. Discontinue ranitidine and change to pantoprazole 40mg . She thinks that she was on this before and had better control.

## 2016-10-07 NOTE — Progress Notes (Signed)
Case discussed with Dr. Krall at the time of the visit.  We reviewed the resident's history and exam and pertinent patient test results.  I agree with the assessment, diagnosis and plan of care documented in the resident's note. 

## 2016-10-07 NOTE — Progress Notes (Signed)
Met with patient briefly today to review demonstration and use of Byetta. Patient reports that she has been taking the Byetta as instructed. However, she did not know to take it before her morning meal and her evening meal 6 hours apart. Her Byetta pen showed about 25-33% of of the contents was gone which is consistent with her report.

## 2016-10-07 NOTE — Assessment & Plan Note (Signed)
Chest pain seems to respond to GERD medication. Few wheezes in L upper lung region but she declined chest xray. EKG without acute ischemic changes. Continue to monitor and change to PPI.

## 2016-10-07 NOTE — Progress Notes (Signed)
   CC: Hyperglycemia  HPI:  [name] is a 56 y.o. woman with history as noted below here with hyperglycemia.  Her blood glucoses have been undetectable (high) on her meter. She does not think she is taking the byetta correctly. She denies missing doses of Lantus.  She smells mildew in her residence. She feels tightness in her chest. She has subjective fevers and chills. Poor appetite. She has a cough productive of white mucous. She has polydipsia and polyuria. No sick contacts. She has chest pain - midsternal, pressure like, x 2 days, drinking water makes it worse. She has not tried any medication for it. Her acid reflux medication helps the pain.   Past Medical History:  Diagnosis Date  . Diabetes mellitus without complication ([place])   . Hepatitis C, chronic ([place])   . Hyperlipidemia associated with type 2 diabetes mellitus ([place])   . Hypertension associated with diabetes ([place])   . Schizophrenia ([place])     Review of Systems:   +nausea and abdominal pain. No diarrhea.  No dysuria or hematuria.   Physical Exam:  Vitals:   10/07/16 0853  BP: 115/74  Pulse: 87  Temp: 98.1 F (36.7 C)  TempSrc: Oral  SpO2: 98%  Weight: 166 lb 4.8 oz (75.4 kg)   General Apperance: NAD HEENT: Normocephalic, atraumatic, anicteric sclera Neck: Supple, trachea midline Lungs: Clear to auscultation bilaterally. Few wheezes in left upper lung region.  Heart: Regular rate and rhythm, no murmur/rub/gallop Abdomen: Soft, nontender, nondistended, no rebound/guarding Extremities: Warm and well perfused, no edema Skin: No rashes or lesions Neurologic: Alert and interactive. No gross deficits.  I independently reviewed her EKG which demonstrates normal sinus rhythm, unchanged from previous EKG.  Assessment & Plan:   See Encounters Tab for problem based charting.  Patient discussed with Dr. Eppie Gibson

## 2016-10-07 NOTE — Assessment & Plan Note (Signed)
Trigger finger of left third finger.   Digital Flexor Injection Procedure Note:  After consent was obtained, using sterile technique the base of the third finger on the left hand was prepped. Triamcinolone 0.69ml and 0.5 ml plain Lidocaine was then injected.  The procedure was well tolerated.  Watch for fever, or increased swelling or persistent pain. Call or return to clinic prn if such symptoms occur or there is failure to improve as anticipated.

## 2016-10-07 NOTE — Assessment & Plan Note (Signed)
Assessment: Hyperglycemia to 618. She received 1L NS bolus. She refused a second liter bolus. Gave her 12u Novolog. Blood glucose down to 495.  Plan:  Increase Lantus to 30 units daily Restart Byetta 76mg BID She met with diabetes educator to go over dosing and how to give medications Follow up in 1-2 weeks with meter

## 2016-10-07 NOTE — Patient Instructions (Addendum)
Stop taking ranitidine. Start taking Protonix once a day for your acid reflux  Take Byetta twice a day at least 6 hours apart. Take 30 (THIRTY) units of LANTUS daily. Please follow up in 1-2 weeks with your meter

## 2016-10-08 ENCOUNTER — Telehealth: Payer: Self-pay | Admitting: Internal Medicine
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
## 2016-10-08 NOTE — Telephone Encounter (Signed)
Returned pt's call - no answer; left message to give [place] a call back.
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
## 2016-10-08 NOTE — Telephone Encounter (Signed)
Pt stated her hand "is back like it was" before receiving steroid injection.; still swollen and "hurts".   Offered an appt today to eval hand; but said she does not have transportation. States "they don't know what they're doing".  "My temperature is still high; still 500"; pt meant blood sugars. I asked if she took her insulin; stated yes. "They shouldn't sent me home". Hangs up telephone after constant complaining no matter what I said.

## 2016-10-08 NOTE — Telephone Encounter (Signed)
Needed more IV fluids, but she refused. Recommend to next provider considering referral for her trigger finger. Thanks

## 2016-10-09 ENCOUNTER — Ambulatory Visit (INDEPENDENT_AMBULATORY_CARE_PROVIDER_SITE_OTHER): Payer: Medicaid Other | Admitting: Internal Medicine

## 2016-10-09 ENCOUNTER — Encounter: Payer: Self-pay | Admitting: Internal Medicine

## 2016-10-09 DIAGNOSIS — R1310 Dysphagia, unspecified: Secondary | ICD-10-CM

## 2016-10-09 DIAGNOSIS — Z9119 Patient's noncompliance with other medical treatment and regimen: Secondary | ICD-10-CM | POA: Diagnosis not present

## 2016-10-09 DIAGNOSIS — Z87891 Personal history of nicotine dependence: Secondary | ICD-10-CM | POA: Diagnosis not present

## 2016-10-09 DIAGNOSIS — R634 Abnormal weight loss: Secondary | ICD-10-CM

## 2016-10-09 DIAGNOSIS — E1165 Type 2 diabetes mellitus with hyperglycemia: Secondary | ICD-10-CM | POA: Diagnosis not present

## 2016-10-09 DIAGNOSIS — R1110 Vomiting, unspecified: Secondary | ICD-10-CM | POA: Insufficient documentation

## 2016-10-09 DIAGNOSIS — Z5329 Procedure and treatment not carried out because of patient's decision for other reasons: Secondary | ICD-10-CM

## 2016-10-09 DIAGNOSIS — R05 Cough: Secondary | ICD-10-CM

## 2016-10-09 DIAGNOSIS — R6883 Chills (without fever): Secondary | ICD-10-CM

## 2016-10-09 DIAGNOSIS — R197 Diarrhea, unspecified: Secondary | ICD-10-CM

## 2016-10-09 DIAGNOSIS — Z794 Long term (current) use of insulin: Secondary | ICD-10-CM | POA: Diagnosis not present

## 2016-10-09 DIAGNOSIS — R059 Cough, unspecified: Secondary | ICD-10-CM | POA: Insufficient documentation

## 2016-10-09 DIAGNOSIS — G43A0 Cyclical vomiting, not intractable: Secondary | ICD-10-CM

## 2016-10-09 DIAGNOSIS — E1100 Type 2 diabetes mellitus with hyperosmolarity without nonketotic hyperglycemic-hyperosmolar coma (NKHHC): Secondary | ICD-10-CM

## 2016-10-09 LAB — GLUCOSE, CAPILLARY: Glucose-Capillary: 496 mg/dL — ABNORMAL HIGH (ref 65–99)

## 2016-10-09 NOTE — Progress Notes (Signed)
I supervised the trigger finger injection.

## 2016-10-09 NOTE — Telephone Encounter (Signed)
Pt calls back today and is not irate, she states she has cbg readings of "HI", not eating, fever 101., coughing, abd pain, diarrhea also states her hand" needs to be checked cause it wasn't done right, somebody needs to fix it", she states her mother was a Therapist, sports and told her today she is going to have a stroke because of her blood sugar, that she should have been admitted. appt [facility] @ 0228

## 2016-10-09 NOTE — Assessment & Plan Note (Signed)
The cough is productive of clear stringy phlegm. At times she has throat pain with swallowing. She has also had less of an appetite, she has not eaten anything since last night when she had soup.   I suspect that these symptoms may be related to viral illness and have suggested that she continue conservative management.

## 2016-10-09 NOTE — Patient Instructions (Addendum)
It was a pleasure to meet you today [name],   For your diabetes,  START taking lantus 35 units every night  START taking Byetta 10 mg twice daily   For your vomiting,  We would recommend that you have a gastric emptying study and EGD

## 2016-10-09 NOTE — Progress Notes (Signed)
Pt continues to state she cannot get the care she wants at the [facility] and that she is looking for alternative care. I presented her with the dismissal letter, explaining that the Medical Director with whom she spoke and I agreed that we heard her saying she was not happy at [facility] and did not feel she was getting her needs met. Therefore, the [facility] is dismissing her (see letter in [facility]) to find care somewhere she would be happy. She had stated to the Medical Director that she did not have transportation home; I offered her a bus pass to which she responded "I can't ride a bus with all those germs." I let her know the bus pass was the only resource the [facility] had to offer and she left the clinic with dismissal letter and bus pass in hand. Yvonna Alanis, RN, [facility] Director, 10/09/16, 5:40P

## 2016-10-09 NOTE — Assessment & Plan Note (Signed)
We think this may be related to gastroparesis but are concerned for malignancy given her dysphagia and recent weight loss. Another consideration is cannabinoid hyperemesis syndrome, however I did not feel comfortable asking her about marijuana use given her frustration today. We planned to order a gastric emptying study and EGD and explained these procedures and the differentials that we were considering

## 2016-10-09 NOTE — Telephone Encounter (Signed)
Kayla Gregory was dismissed - rude to staff (nursese and physicians), not willing to work with [place] to address her medical issues (refused work up today of medical complaints), repeatedly states she would seek care elsewhere, and unhappiness with care here.    [name] handed her her dismissal letter.  Dr Charlynn Grimes - we need to provide medically necessary refills and care for 30 days.  Doris - would you pls remove [place] as PCP in 30 days?

## 2016-10-09 NOTE — Assessment & Plan Note (Signed)
At last office visit she was found to have a blood sugar of 600. Lantus was increased to 30 units and byetta 5 mcg BID was restarted.  Since the last visit her monitor readings have improved from undetectable to 450 - 500s. She states that she did not take any of her medications today but took lantus and byetta last night. CBG today is 496. She denies abdominal pain and has vomiting which has been ongoing for the past month. I think she would benefit from increasing her lantus to 35 units qHS and byetta 10 mg BID. She replied "The last doctor already increased the lantus, can't yall ever do something for me, I need to be admitted to the hospital and have some changes made, the doctors upstairs treat patients differently than you all do down here."

## 2016-10-09 NOTE — Progress Notes (Signed)
CC: productive cough    HPI: [name] is a 56 y.o. with past medical history as outlined below who presents to clinic with concern for 3 days of productive cough. The cough is productive of clear stringy phlegm. At times she has throat pain with swallowing. She has also had less of an appetite, she has not eaten anything since last night when she had soup.  She also describes 1 month of vomiting whenever she eats something and dysphagia when she tries to swallow solid food. She has not been on antibiotics in the past month. She has lost 14 lbs since the end of December 2017.   At last office visit she was found to have a blood sugar of 600. Lantus was increased to 30 units and byetta 5 mcg BID was restarted.  Since the last visit her monitor readings have improved from undetectable to 450 - 500s. She states that she did not take any of her medications today but took lantus and byetta last night. CBG today is 496. She denies abdominal pain and has vomiting which has been ongoing for the past month. She has not had any hypoglycemic readings.   Please see problem list for status of the pt's chronic medical problems.  Past Medical History:  Diagnosis Date  . Diabetes mellitus without complication ([place]) 2229  . Hepatitis C, chronic ([place])   . Hyperlipidemia associated with type 2 diabetes mellitus ([place])   . Hypertension associated with diabetes ([place])   . Schizophrenia ([place])     Review of Systems:  Please see each problem below for a pertinent review of systems.  Physical Exam:  Vitals:   10/09/16 1527  BP: 140/80  Pulse: 90  TempSrc: Oral  SpO2: 98%  Weight: 164 lb (74.4 kg)  Height: 5\' 1"  (1.549 m)   Physical Exam  Constitutional: She appears well-developed and well-nourished. No distress.  HENT:  Mouth/Throat: No oropharyngeal exudate.  No maxillary or frontal sinus tenderness   Eyes: Conjunctivae are normal. No scleral icterus.  Cardiovascular: Normal rate and regular  rhythm.   No murmur heard. Pulmonary/Chest: Effort normal. No respiratory distress.  Abdominal: Soft. Bowel sounds are normal. She exhibits no distension. There is no tenderness. There is no guarding.  Lymphadenopathy:    She has no cervical adenopathy.  Neurological: She is alert.  Skin: She is not diaphoretic.  Psychiatric: She is aggressive.    Assessment & Plan:   See Encounters Tab for problem based charting.  Cough, chills, diarrhea  I suspect that these symptoms may be related to viral illness and have suggested that she continue conservative management.   Diabetes  At last office visit she was found to have a blood sugar of 600. Lantus was increased to 30 units and byetta 5 mcg BID was restarted.  Since the last visit her monitor readings have improved from undetectable to 450 - 500s. She states that she did not take any of her medications today but took lantus and byetta last night. CBG today is 496. She denies abdominal pain and has vomiting which has been ongoing for the past month. She has not had any hypoglycemic readings.  I think she would benefit from increasing her lantus to 35 units qHS and byetta 10 mg BID. She replied "The last doctor already increased the lantus, can't yall ever do something for me, I need to be admitted to the hospital and have some changes made, the doctors upstairs treat patients differently than you  all do down here."   Vomiting  We think this may be related to gastroparesis but are concerned for malignancy given her dysphagia and recent weight loss. Another consideration is cannabinoid hyperemesis syndrome, however I did not feel comfortable asking her about marijuana use given her frustration today. We planned to order a gastric emptying study and EGD and explained these procedures and the differentials that we were considering.   After explaining these concerns she began shaking her head and said "I dont know why I came, you all never do anything  to help me here." Dr. Lynnae January was in the room and explained that we had this plan to further work up her concerning symptoms and that we would like to work with her but that ultimately the decision was hers. The patient stated " I'm leaving this clinic, I already decided to go somewhere else to find a real doctor." Dr. Lynnae January explained that we would continue providing her medication refills for the next 30 days while she became established with a new practice. She was provided with a letter of dismissal from the clinic.  Patient seen with Dr. Lynnae January

## 2016-10-09 NOTE — Progress Notes (Signed)
Dr Hetty Ely and I discussed Ms Demarco's medical issues and formulated a plan. Due to prior notes documenting inappropriate interactions with [facility] staff and physicians, I went back into the room with Dr Hetty Ely to see Ms Delpozo. I asked questions to clarify her sxs. Several times she spoke over me and directed the conversation back to her finger. I asked her permission to speak to Dr Hetty Ely in the room to modify our plans based on new info I obtained. Dr Hetty Ely and I spoke in room for one minute. Dr Hetty Ely then took over and explained our thoughts and plans. Ms Tullis would not engage Dr Hetty Ely in conversation about our tx plans instead focusing on being sent home with a sugar of 600, her finger, [facility] does nothing for her, that she was looking for a new doctor, and that we must provide her a way home bc medicaid pays for her transportation (she had not arranged with medicaid to drive her home - her nephew dropped her off). I agreed that she could go anywhere she wanted for care but asked if she would agree to discuss and follow through with our plan. She stated no that she wanted nothing we offered bc we do not help her. At this point, I informed her that we could not continue to be responsible for her care without her participation and that we would dismiss her but provide medically necessary care and refills for 30 days. She states we could do as we please. I left the room, printed off her dismissal letter, spoke to Tamela Oddi and [name], asking that another clinic administrator interact with her. Yvonna Alanis entered room with the dismissal letter. I informed Dr Charlynn Grimes, her PCP.

## 2016-10-10 NOTE — Progress Notes (Signed)
Internal Medicine Clinic Attending  I saw and evaluated the patient.  I personally confirmed the key portions of the history and exam documented by Dr. Hetty Ely and I reviewed pertinent patient test results.  The assessment, diagnosis, and plan were formulated together and I agree with the documentation in the resident's note. Kayla Gregory was alert, remembered recent events, was not somnolent, speech was clear. Although a full mental status exam was not performed, she was able and did make her own medical decisions. She has several urgent medical needs - uncontrolled DM, weight loss / dysphagia / N / V. She did not require admission today although she does require further investigation of these sxs, she stated she did not want to receive work-up of them from [facility]. She was told verbally and by written letter that she needed to seek care elsewhere quickly.

## 2016-10-11 ENCOUNTER — Ambulatory Visit: Payer: Medicaid Other

## 2016-10-11 LAB — HM DIABETES EYE EXAM

## 2016-10-17 LAB — HM DIABETES EYE EXAM

## 2016-10-24 ENCOUNTER — Encounter: Payer: Self-pay | Admitting: Pulmonary Disease

## 2016-10-24 LAB — HM DIABETES EYE EXAM

## 2016-11-01 ENCOUNTER — Emergency Department (HOSPITAL_COMMUNITY): Payer: Medicaid Other

## 2016-11-01 ENCOUNTER — Encounter (HOSPITAL_COMMUNITY): Payer: Self-pay

## 2016-11-01 ENCOUNTER — Emergency Department (HOSPITAL_COMMUNITY)
Admission: EM | Admit: 2016-11-01 | Discharge: 2016-11-01 | Disposition: A | Discharge status: Home / Self Care | Payer: Medicaid Other | Attending: Emergency Medicine | Admitting: Emergency Medicine

## 2016-11-01 DIAGNOSIS — W19XXXA Unspecified fall, initial encounter: Secondary | ICD-10-CM

## 2016-11-01 DIAGNOSIS — M25511 Pain in right shoulder: Secondary | ICD-10-CM | POA: Diagnosis not present

## 2016-11-01 DIAGNOSIS — Z96642 Presence of left artificial hip joint: Secondary | ICD-10-CM | POA: Insufficient documentation

## 2016-11-01 DIAGNOSIS — Z87891 Personal history of nicotine dependence: Secondary | ICD-10-CM | POA: Insufficient documentation

## 2016-11-01 DIAGNOSIS — E119 Type 2 diabetes mellitus without complications: Secondary | ICD-10-CM | POA: Insufficient documentation

## 2016-11-01 DIAGNOSIS — Z79899 Other long term (current) drug therapy: Secondary | ICD-10-CM | POA: Diagnosis not present

## 2016-11-01 DIAGNOSIS — Z794 Long term (current) use of insulin: Secondary | ICD-10-CM | POA: Diagnosis not present

## 2016-11-01 DIAGNOSIS — M25531 Pain in right wrist: Secondary | ICD-10-CM | POA: Diagnosis not present

## 2016-11-01 DIAGNOSIS — W010XXA Fall on same level from slipping, tripping and stumbling without subsequent striking against object, initial encounter: Secondary | ICD-10-CM | POA: Insufficient documentation

## 2016-11-01 DIAGNOSIS — Y92511 Restaurant or cafe as the place of occurrence of the external cause: Secondary | ICD-10-CM | POA: Diagnosis not present

## 2016-11-01 DIAGNOSIS — Y999 Unspecified external cause status: Secondary | ICD-10-CM | POA: Insufficient documentation

## 2016-11-01 DIAGNOSIS — Y939 Activity, unspecified: Secondary | ICD-10-CM | POA: Insufficient documentation

## 2016-11-01 DIAGNOSIS — I10 Essential (primary) hypertension: Secondary | ICD-10-CM | POA: Insufficient documentation

## 2016-11-01 DIAGNOSIS — M25521 Pain in right elbow: Secondary | ICD-10-CM

## 2016-11-01 MED ORDER — METHOCARBAMOL 500 MG PO TABS: 500.0000 mg | ORAL_TABLET | Freq: Every evening | ORAL | 0 refills | Status: DC | PRN

## 2016-11-01 MED ORDER — METHOCARBAMOL 500 MG PO TABS
500.0000 mg | ORAL_TABLET | Freq: Once | ORAL | Status: AC
  Administered 2016-11-01: 500 mg via ORAL
  Filled 2016-11-01: qty 1

## 2016-11-01 NOTE — Discharge Instructions (Signed)
Wear sling for comfort. Do range of motion exercises daily Use ice on the affected area several times a day Take muscle relaxer as needed for stiffness

## 2016-11-01 NOTE — ED Triage Notes (Signed)
Pt states slipped and fell. Pt complaining of R wrist pain, R elbow pain and R shoulder pain. Pt states unable to move arm due to pain. Pt with 2+ pulse, full sensation, cap refill <3s. Pt denies any head injury/trauma. Pt denies any LOC.

## 2016-11-01 NOTE — ED Notes (Signed)
Pt transported via [place] to 3E to her sisters room after d/c. Pt instructed regarding appropriate footwear for fall prevention. Pt currently wearing very thin soled slippers with no support.

## 2016-11-01 NOTE — ED Notes (Signed)
Pt had a fall in cafeteria today. Landed on right elbow. No visible trauma to site. See providers assessment.

## 2016-11-01 NOTE — ED Notes (Signed)
Pt to xray via WC
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
## 2016-11-05 ENCOUNTER — Ambulatory Visit: Payer: Medicaid Other | Admitting: Family Medicine

## 2016-11-08 ENCOUNTER — Ambulatory Visit (INDEPENDENT_AMBULATORY_CARE_PROVIDER_SITE_OTHER): Payer: Medicaid Other | Admitting: Family Medicine

## 2016-11-08 ENCOUNTER — Encounter: Payer: Self-pay | Admitting: Family Medicine

## 2016-11-08 VITALS — BP 110/70 | HR 76 | Temp 98.3°F | Ht 61.0 in | Wt 164.8 lb

## 2016-11-08 DIAGNOSIS — W19XXXA Unspecified fall, initial encounter: Secondary | ICD-10-CM | POA: Diagnosis not present

## 2016-11-08 DIAGNOSIS — I208 Other forms of angina pectoris: Secondary | ICD-10-CM | POA: Diagnosis not present

## 2016-11-08 DIAGNOSIS — Z794 Long term (current) use of insulin: Secondary | ICD-10-CM

## 2016-11-08 DIAGNOSIS — E785 Hyperlipidemia, unspecified: Secondary | ICD-10-CM

## 2016-11-08 DIAGNOSIS — E1169 Type 2 diabetes mellitus with other specified complication: Secondary | ICD-10-CM

## 2016-11-08 DIAGNOSIS — K219 Gastro-esophageal reflux disease without esophagitis: Secondary | ICD-10-CM

## 2016-11-08 DIAGNOSIS — E1100 Type 2 diabetes mellitus with hyperosmolarity without nonketotic hyperglycemic-hyperosmolar coma (NKHHC): Secondary | ICD-10-CM

## 2016-11-08 LAB — POCT GLYCOSYLATED HEMOGLOBIN (HGB A1C): HEMOGLOBIN A1C: 9.7

## 2016-11-08 NOTE — Patient Instructions (Signed)
It was great seeing you today! We have addressed the following issues today  1. I will refer you to sports medicine for your right elbow shoulder and wrist pain. 2. I will refer you to gastroenterology for your difficulty swallowing to assess need for EGD 3. Take Ibuprofen with Tylenol for your pain, be aware it could make your heartburn a little worst. 4. I Will like to see you in clinic in about two weeks to follow up on some of your results.  If we did any lab work today, and the results require attention, either me or my nurse will get in touch with you. If everything is normal, you will get a letter in mail and a message via . If you don't hear from [place] in two weeks, please give [place] a call. Otherwise, we look forward to seeing you again at your next visit. If you have any questions or concerns before then, please call the clinic at [phone].  Please bring all your medications to every doctors visit  Sign up for My Chart to have easy access to your labs results, and communication with your Primary care physician.    Please check-out at the front desk before leaving the clinic.    Take Care,   Dr. Andy Gauss   Diabetes Mellitus and Food It is important for you to manage your blood sugar (glucose) level. Your blood glucose level can be greatly affected by what you eat. Eating healthier foods in the appropriate amounts throughout the day at about the same time each day will help you control your blood glucose level. It can also help slow or prevent worsening of your diabetes mellitus. Healthy eating may even help you improve the level of your blood pressure and reach or maintain a healthy weight. General recommendations for healthful eating and cooking habits include:  Eating meals and snacks regularly. Avoid going long periods of time without eating to lose weight.  Eating a diet that consists mainly of plant-based foods, such as fruits, vegetables, nuts, legumes, and whole  grains.  Using low-heat cooking methods, such as baking, instead of high-heat cooking methods, such as deep frying. Work with your dietitian to make sure you understand how to use the Nutrition Facts information on food labels. How can food affect me? Carbohydrates  Carbohydrates affect your blood glucose level more than any other type of food. Your dietitian will help you determine how many carbohydrates to eat at each meal and teach you how to count carbohydrates. Counting carbohydrates is important to keep your blood glucose at a healthy level, especially if you are using insulin or taking certain medicines for diabetes mellitus. Alcohol  Alcohol can cause sudden decreases in blood glucose (hypoglycemia), especially if you use insulin or take certain medicines for diabetes mellitus. Hypoglycemia can be a life-threatening condition. Symptoms of hypoglycemia (sleepiness, dizziness, and disorientation) are similar to symptoms of having too much alcohol. If your health care provider has given you approval to drink alcohol, do so in moderation and use the following guidelines:  Women should not have more than one drink per day, and men should not have more than two drinks per day. One drink is equal to:  12 oz of beer.  5 oz of wine.  1 oz of hard liquor.  Do not drink on an empty stomach.  Keep yourself hydrated. Have water, diet soda, or unsweetened iced tea.  Regular soda, juice, and other mixers might contain a lot of carbohydrates and should  be counted. What foods are not recommended? As you make food choices, it is important to remember that all foods are not the same. Some foods have fewer nutrients per serving than other foods, even though they might have the same number of calories or carbohydrates. It is difficult to get your body what it needs when you eat foods with fewer nutrients. Examples of foods that you should avoid that are high in calories and carbohydrates but low in  nutrients include:  Trans fats (most processed foods list trans fats on the Nutrition Facts label).  Regular soda.  Juice.  Candy.  Sweets, such as cake, pie, doughnuts, and cookies.  Fried foods. What foods can I eat? Eat nutrient-rich foods, which will nourish your body and keep you healthy. The food you should eat also will depend on several factors, including:  The calories you need.  The medicines you take.  Your weight.  Your blood glucose level.  Your blood pressure level.  Your cholesterol level. You should eat a variety of foods, including:  Protein.  Lean cuts of meat.  Proteins low in saturated fats, such as fish, egg whites, and beans. Avoid processed meats.  Fruits and vegetables.  Fruits and vegetables that may help control blood glucose levels, such as apples, mangoes, and yams.  Dairy products.  Choose fat-free or low-fat dairy products, such as milk, yogurt, and cheese.  Grains, bread, pasta, and rice.  Choose whole grain products, such as multigrain bread, whole oats, and [place] rice. These foods may help control blood pressure.  Fats.  Foods containing healthful fats, such as nuts, avocado, olive oil, canola oil, and fish. Does everyone with diabetes mellitus have the same meal plan? Because every person with diabetes mellitus is different, there is not one meal plan that works for everyone. It is very important that you meet with a dietitian who will help you create a meal plan that is just right for you. This information is not intended to replace advice given to you by your health care provider. Make sure you discuss any questions you have with your health care provider. Document Released: 05/02/2005 Document Revised: 01/11/2016 Document Reviewed: 07/02/2013 Elsevier Interactive Patient Education  2017 [employer].

## 2016-11-08 NOTE — Progress Notes (Signed)
Subjective:    Patient ID: Kayla Gregory, female    DOB: 08-Apr-1961, 56 y.o.   MRN: [id number]   CC: New patient establishing care with PCP  HPI: Kayla Gregory is a 56 yo female with a past medical history significant for T2DM, HLD, HTN, GERD who presented today for the first time to establish care with our clinic. Patient states  that she was unhappy with the type of care she was receiving at the internal medicine clinic and wanted to transfer care. Patient recently had a fall in the cafeteria at [facility] main hospital and is having right shoulder, elbow and wrist pain. Patient reports that she was told that she had no fractures but does not believe the doctors. Patient has refused to take robaxin because she is a recovering addict and would not prefer that. She has been taking tylenol for her pain with minimal relief in symptoms. Patient also mentioned that food and water are getting stuck in her throat. Patient has a long history of GERD. Patient admit to some weight loss recently. Patient denies any chest pain, SOB, abdominal pain.   Smoking status reviewed   ROS: all other systems were reviewed and are negative other than in the HPI   Past medical history, surgical, family, and social history reviewed and updated in the EMR as appropriate.  Objective:  BP 110/70   Pulse 76   Temp 98.3 F (36.8 C) (Oral)   Ht 5\' 1"  (1.549 m)   Wt 74.8 kg (164 lb 12.8 oz)   SpO2 97%   BMI 31.14 kg/m   Vitals and nursing note reviewed  General: NAD, able to participate in exam Cardiac: RRR, normal heart sounds, no murmurs. 2+ radial and PT pulses bilaterally Respiratory: CTAB, normal effort, No wheezes, rales or rhonchi Abdomen: soft, nontender, nondistended, no hepatic or splenomegaly, +BS Extremities: no edema or cyanosis, patient right arm is currently in a sling with limited ROM secondary to pain Skin: warm and dry, no rashes noted Neuro: alert and oriented x4, no focal deficits Psych:  Normal affect and mood   Assessment & Plan:   #Dysphagia Patient has a long history of GERD currently on panteprazole and ranitidine. Given reported dysphagia with food and fluid, recent weight loss, chronic GERD, patient should be seen by GI for evaluation for possible EGD to rule out benign obstruction vs carcinoma vs dysfunctional esophageal motility. --Continue pantoprazole 41 mg daily --Will refer to GI for further evaluation of dysphagia and need to EGD   #Right shoulder, elbow and wrist pain Patient had a fall on 3/16 in the cafeteria at [facility] and had imaging which ruled out fracture, dislocation and effusion. Patient was prescribed Robaxin because she is a recovering addict. Patient concerned injury is not getting better, limited range of motion on exam due to pain. Patient could benefit from referral to specialist. --Continue Tylenol as needed --Could add Ibuprofen to current regimen, patient made aware that it could exacerbate GERD --Will refer to Sports medicine for further evaluation  #T2DM Patient A1c today is 9.7 up from 8.6 on 08/07/2016. Patient is currently taking Lantus and  Byetta. Dietary indiscretions reported by patient. Patient recently had CBG of 618. Patient will work on dietary changes and will bring glucometer at next visit. Will continue current treatment plan and will use CBG readings to guide therapy. Will consider adding short insulin. --Continue Lantus 35 u daily  --Continue Byetta 5mg  tid --DM Diet handout given  Cornelious Diven,  MD Family Medicine Resident PGY-1

## 2016-11-11 ENCOUNTER — Other Ambulatory Visit: Payer: Self-pay | Admitting: Pulmonary Disease

## 2016-11-11 MED ORDER — EXENATIDE 5 MCG/0.02ML ~~LOC~~ SOPN: 5.0000 ug | PEN_INJECTOR | Freq: Two times a day (BID) | SUBCUTANEOUS | 1 refills | Status: DC

## 2016-11-11 MED ORDER — ATORVASTATIN CALCIUM 40 MG PO TABS: 40.0000 mg | ORAL_TABLET | Freq: Every day | ORAL | 3 refills | Status: DC

## 2016-11-11 MED ORDER — FLUOXETINE HCL 20 MG PO CAPS: 20.0000 mg | ORAL_CAPSULE | Freq: Every day | ORAL | 1 refills | Status: DC

## 2016-11-11 MED ORDER — GABAPENTIN 300 MG PO CAPS: 300.0000 mg | ORAL_CAPSULE | Freq: Three times a day (TID) | ORAL | 2 refills | Status: DC

## 2016-11-11 MED ORDER — ASPIRIN 81 MG PO TBEC: 81.0000 mg | DELAYED_RELEASE_TABLET | Freq: Every day | ORAL | 2 refills | Status: DC

## 2016-11-11 NOTE — Telephone Encounter (Signed)
Pt called to refill pantrozole and needles for her lantus dytta. Was last seen 11-08-16.  [facility] [address]

## 2016-11-11 NOTE — Telephone Encounter (Signed)
Pt would like to get everything refilled. Pt uses [facility] on [address]

## 2016-11-11 NOTE — Addendum Note (Signed)
Addended by: Marjie Skiff F on: 11/11/2016 05:34 PM   Modules accepted: Orders

## 2016-11-13 ENCOUNTER — Other Ambulatory Visit: Payer: Self-pay | Admitting: Family Medicine

## 2016-11-13 MED ORDER — PANTOPRAZOLE SODIUM 40 MG PO TBEC: 40.0000 mg | DELAYED_RELEASE_TABLET | Freq: Every day | ORAL | 1 refills | Status: DC

## 2016-11-13 NOTE — Telephone Encounter (Signed)
Pt  calling to request refill of:  Name of Medication(s):pantrolzole  Last date of OV:  11-08-16  Pharmacy: [facility] [place]  Will route refill request to Clinic RN.  Discussed with patient policy to call pharmacy for future refills.  Also, discussed refills may take up to 48 hours to approve or deny.  Roseanna Rainbow

## 2016-11-20 ENCOUNTER — Ambulatory Visit (INDEPENDENT_AMBULATORY_CARE_PROVIDER_SITE_OTHER): Payer: Medicaid Other | Admitting: Family Medicine

## 2016-11-20 ENCOUNTER — Encounter: Payer: Self-pay | Admitting: Family Medicine

## 2016-11-20 VITALS — BP 156/73 | Ht 61.0 in | Wt 164.0 lb

## 2016-11-20 DIAGNOSIS — M25531 Pain in right wrist: Secondary | ICD-10-CM | POA: Diagnosis not present

## 2016-11-20 DIAGNOSIS — M25521 Pain in right elbow: Secondary | ICD-10-CM

## 2016-11-20 DIAGNOSIS — M25511 Pain in right shoulder: Secondary | ICD-10-CM | POA: Insufficient documentation

## 2016-11-20 MED ORDER — MELOXICAM 15 MG PO TABS: 15.0000 mg | ORAL_TABLET | Freq: Every day | ORAL | 1 refills | Status: DC

## 2016-11-20 NOTE — Patient Instructions (Addendum)
Thank you for coming in,   Stop the ibuprofen and we will try mobic (meloxicam)   I have made a referral to physical therapy.   You have biceps tendinitis.    Please feel free to call with any questions or concerns at any time, at [phone]. --Dr. Ellis Parents Tendon Tendinitis (Proximal) and Tenosynovitis The proximal biceps tendon is a strong cord of tissue that connects the biceps muscle, on the front of the upper arm, to the shoulder blade. Tendinitis is inflammation of a tendon. Tenosynovitis is inflammation of the lining around the tendon (tendon sheath). These conditions often occur at the same time, and they can interfere with the ability to bend the elbow and turn the hand palm-up (supination). Proximal biceps tendon tendinitis and tenosynovitis are usually caused by overusing the shoulder joint and the biceps muscle. These conditions usually heal within 6 weeks. Proximal biceps tendon tendinitis may include a grade 1 or grade 2 strain of the tendon. A grade 1 strain is mild, and it involves a slight pull of the tendon without any stretching or noticeable tearing of the tendon. There is usually no loss of biceps muscle strength. A grade 2 strain is moderate, and it involves a small tear in the tendon. The tendon is stretched, and biceps strength is usually decreased. What are the causes? This condition may be caused by:  A sudden increase in frequency or intensity of activity that involves the shoulder and the biceps muscle.  Overuse of the biceps muscle. This can happen when you do the same movements over and over, such as:  Supination.  Forceful straightening (hyperextension) of the elbow.  Bending the elbow.  A direct, forceful hit or injury (trauma) to the elbow. This is rare. What increases the risk? The following factors may make you more likely to develop this condition:  Playing contact sports.  Playing sports that involve throwing and overhead movements, including  racket sports, gymnastics, weight lifting, or bodybuilding.  Doing physical labor.  Having poor strength and flexibility of the arm and shoulder. What are the signs or symptoms? Symptoms of this condition may include:  Pain and inflammation in the front of the shoulder. Pain may get worse with movement, especially when you use resistance, as in weight lifting.  A feeling of warmth in the front of the shoulder.  Limited range of motion of the shoulder and the elbow.  A crackling sound (crepitation) when you move or touch the shoulder or the upper arm. In some cases, symptoms may return (recur) after treatment, and they may be long-lasting (chronic). How is this diagnosed? This condition is diagnosed based on your symptoms, your medical history, and a physical exam. You may have tests, including X-rays or MRIs. Your health care provider may test your range of motion by asking you to do arm movements. How is this treated? This condition is treated by resting and icing the injured area, and by doing physical therapy exercises. Depending on the severity of your condition, treatment may also include:  Medicines to help relieve pain and inflammation.  Ultrasound therapy. This is the application of sound waves to the injured area.  Injecting medicines (corticosteroids) into your tendon sheath.  Injecting medicines that numb the area (local anesthetics).  Surgery to remove the damaged part of the tendon and reattach the undamaged part of the tendon to the arm bone (humerus). This is usually only done if you have symptoms that do not get better with other treatment methods.  Follow these instructions at home: Managing pain, stiffness, and swelling   If directed, put ice on the injured area:  Put ice in a plastic bag.  Place a towel between your skin and the bag.  Leave the ice on for 20 minutes, 2-3 times a day.  Move your fingers often to avoid stiffness and to lessen  swelling.  Raise (elevate) the injured area above the level of your heart while you are sitting or lying down.  If directed, apply heat to the affected area before you exercise. Use the heat source that your health care provider recommends, such as a moist heat pack or a heating pad.  Place a towel between your skin and the heat source.  Leave the heat on for 20-30 minutes.  Remove the heat if your skin turns bright red. This is especially important if you are unable to feel pain, heat, or cold. You may have a greater risk of getting burned. Activity   Return to your normal activities as told by your health care provider. Ask your health care provider what activities are safe for you.  Do not lift anything that is heavier than 10 lb (4.5 kg) until your health care provider tells you that it is safe.  Avoid activities that cause pain or make your condition worse.  Do exercises as told by your health care provider. General instructions   Take over-the-counter and prescription medicines only as told by your health care provider.  Do not drive or operate heavy machinery while taking prescription pain medicines.  Keep all follow-up visits as told by your health care provider. This is important. How is this prevented?  Warm up and stretch before being active.  Cool down and stretch after being active.  Give your body time to rest between periods of activity.  Make sure any equipment that you use is fitted to you.  Be safe and responsible while being active to avoid falls.  Do at least 150 minutes of moderate-intensity aerobic exercise each week, such as brisk walking or water aerobics.  Maintain physical fitness, including:  Strength.  Flexibility.  Cardiovascular fitness.  Endurance. Contact a health care provider if:  You have symptoms that get worse or do not get better after 2 weeks of treatment.  You develop new symptoms. Get help right away if:  You develop  severe pain. This information is not intended to replace advice given to you by your health care provider. Make sure you discuss any questions you have with your health care provider. Document Released: 08/05/2005 Document Revised: 04/11/2016 Document Reviewed: 07/14/2015 Elsevier Interactive Patient Education  2017 [employer].

## 2016-11-20 NOTE — Assessment & Plan Note (Signed)
She may have some soft tissue contusions over the right upper extremity but there was evidence of a biceps tendinitis is based on ultrasound. Clinical exam was limited due to the amount of pain that she was in. - Referral to physical therapy was placed - Meloxicam was sent in. - She will follow-up in 4 weeks - Advised that she should discontinue her shoulder sling as she may get stiff in her elbow joint or predispose her shoulder to adhesive capsulitis.

## 2016-11-20 NOTE — Progress Notes (Addendum)
  Kayla Gregory - 56 y.o. female MRN [id number]  Date of birth: [birth year]  SUBJECTIVE:  Including CC & ROS.   Kayla Gregory is a 56 year old female that is presenting with right shoulder, right elbow, and right wrist pain. She had a fall on 3/16 where she fell on her right side. This occurred [facility]. She had x-rays done at that time and all were negative for fracture. Since that time she has been wearing a sling for her right arm. She reports no improvement of her pain. She has been taking Tylenol for pain with minimal improvement. She reports taking a muscle relaxer but that did not help. She has pain in the posterior elbow as well as in her trapezius of her right side. She has some tenderness on the ulnar side of her wrist at the distal aspect. She feels that her trapezius has remained swollen.  ROS: No unexpected weight loss, fever, chills, swelling, instability, numbness/tingling, redness, otherwise see HPI   HISTORY: Past Medical, Surgical, Social, and Family History Reviewed & Updated per EMR.   Pertinent Historical Findings include: PMSHx -  HTN, DM2, Chronic hepatitis  PSHx -  Left THA, eye implants  FHx -  Heart disease   DATA REVIEWED: 11/01/16: Shoulder, elbow and wrist x-rays: negative for fracture   PHYSICAL EXAM:  VS: BP:(!) 156/73  HR: bpm  TEMP: ( )  RESP:   HT:5\' 1"  (154.9 cm)   WT:164 lb (74.4 kg)  BMI:31.1 PHYSICAL EXAM: Gen: NAD, alert, cooperative with exam, wearing shoulder sliding on the right shoulder. HEENT: clear conjunctiva, EOMI CV:  no edema, capillary refill brisk,  Resp: non-labored, normal speech Skin: no rashes, normal turgor  Neuro: no gross deficits.  Psych:  alert and oriented Shoulder:  Limited due to pain Normal external rotation. Tenderness to palpation throughout the left shoulder Elbow:  Tenderness to palpation over the olecranon and medial and lateral epicondyles Normal range of motion Limited due to pain  Wrist:  No obvious  swelling or ecchymosis  No TTP of the snuffbox.  Normal ROM  Limited due to pain   Limited ultrasound: Right shoulder, Right elbow right Wrist:  The biceps tendon was reviewed and long and short axis and there was a hypoechoic ring around the biceps tendon to suggest a tenosynovitis. This did not extend into the joint capsule. The subscapularis, supraspinatus, and infraspinatus were all normal appearance. Acromioclavicular joint was normal in appearance  The insertion of the triceps tendon was normal into the olecranon. The lateral condyle was normal in appearance.  The distal ulna was negative for any for fracture. There was some chronic changes within the TFCC. The ECU was normal in appearance and long axis.   Summary: Findings are consistent with a biceps tendinitis.   Ultrasound and interpretation by Clearance Coots, MD    ASSESSMENT & PLAN:   Acute pain of right shoulder She may have some soft tissue contusions over the right upper extremity but there was evidence of a biceps tendinitis is based on ultrasound. Clinical exam was limited due to the amount of pain that she was in. - Referral to physical therapy was placed - Meloxicam was sent in. - She will follow-up in 4 weeks - Advised that she should discontinue her shoulder sling as she may get stiff in her elbow joint or predispose her shoulder to adhesive capsulitis.

## 2016-11-21 ENCOUNTER — Other Ambulatory Visit: Payer: Self-pay | Admitting: *Deleted

## 2016-11-21 DIAGNOSIS — M25511 Pain in right shoulder: Secondary | ICD-10-CM

## 2016-11-21 MED ORDER — MELOXICAM 15 MG PO TABS: 15.0000 mg | ORAL_TABLET | Freq: Every day | ORAL | 1 refills | Status: DC

## 2016-11-22 ENCOUNTER — Encounter: Payer: Self-pay | Admitting: Family Medicine

## 2016-11-22 ENCOUNTER — Ambulatory Visit (INDEPENDENT_AMBULATORY_CARE_PROVIDER_SITE_OTHER): Payer: Medicaid Other | Admitting: Family Medicine

## 2016-11-22 ENCOUNTER — Telehealth: Payer: Self-pay | Admitting: Family Medicine

## 2016-11-22 VITALS — BP 128/70 | HR 78 | Temp 98.5°F | Ht 61.0 in | Wt 158.0 lb

## 2016-11-22 DIAGNOSIS — E1100 Type 2 diabetes mellitus with hyperosmolarity without nonketotic hyperglycemic-hyperosmolar coma (NKHHC): Secondary | ICD-10-CM | POA: Diagnosis not present

## 2016-11-22 DIAGNOSIS — I872 Venous insufficiency (chronic) (peripheral): Secondary | ICD-10-CM | POA: Diagnosis not present

## 2016-11-22 DIAGNOSIS — L299 Pruritus, unspecified: Secondary | ICD-10-CM

## 2016-11-22 DIAGNOSIS — Z794 Long term (current) use of insulin: Secondary | ICD-10-CM | POA: Diagnosis not present

## 2016-11-22 MED ORDER — DIPHENHYDRAMINE HCL 25 MG PO CAPS: 25.0000 mg | ORAL_CAPSULE | Freq: Every evening | ORAL | 0 refills | Status: DC | PRN

## 2016-11-22 MED ORDER — GLUCOSE BLOOD VI STRP: ORAL_STRIP | 12 refills | Status: DC

## 2016-11-22 MED ORDER — INSULIN SYRINGES (DISPOSABLE) U-100 0.3 ML MISC: 11 refills | Status: DC

## 2016-11-22 MED ORDER — PEN NEEDLES 31G X 5 MM MISC: 1.0000 "application " | Freq: Two times a day (BID) | 3 refills | Status: DC

## 2016-11-22 MED ORDER — ACCU-CHEK SOFTCLIX LANCET DEV MISC: 2 refills | Status: AC

## 2016-11-22 NOTE — Patient Instructions (Addendum)
It was great seeing you today! We have addressed the following issues today  1. I refill your medications and will refer to dermatology. I will see you in about a month  2. Please take your medications as discussed  If we did any lab work today, and the results require attention, either me or my nurse will get in touch with you. If everything is normal, you will get a letter in mail and a message via . If you don't hear from [place] in two weeks, please give [place] a call. Otherwise, we look forward to seeing you again at your next visit. If you have any questions or concerns before then, please call the clinic at [phone].  Please bring all your medications to every doctors visit  Sign up for My Chart to have easy access to your labs results, and communication with your Primary care physician.    Please check-out at the front desk before leaving the clinic.    Take Care,   Dr. Andy Gauss

## 2016-11-22 NOTE — Telephone Encounter (Signed)
Patient is aware that scripts have been sent into the pharmacy. Miaisabella Bacorn,CMA

## 2016-11-22 NOTE — Telephone Encounter (Signed)
Pharmacy is calling because they didn't receive the prescription for the patient's syringes and benadryl. She is at the pharmacy now. jw

## 2016-11-24 NOTE — Progress Notes (Signed)
   Subjective:    Patient ID: Kayla Gregory, female    DOB: 1960-12-12, 56 y.o.   MRN: [id number]   CC: Diabetes management follow up  HPI: Patient is 56 yo female with a past medical history significant for HTN, GERD, T2DM, chronic Hep C who present today for follow up for T2DM management. Patient is a new to the practice and on last visit had a A1c of 9.7 up from 8.6 in December 2017 when she was a patient at the internal medicine resident clinic. Patient continue to endorse dietary indiscretions and medications non adherence. Patient has not taken long acting insulin for the past 2 weeks because she has ran out of needles. She continue to take her byetta as prescribe. Patient also report bilateral lower extremity skin hyperpigmentation. Patient denies any SOB, palpitations, chest pain, increase urinary frequency, blurry vision. Patient still awaiting GI appointment for dysphagia.   Smoking status reviewed   ROS: all other systems were reviewed and are negative other than in the HPI   Past medical history, surgical, family, and social history reviewed and updated in the EMR as appropriate.  Objective:  BP 128/70 (BP Location: Left Arm, Patient Position: Sitting, Cuff Size: Normal)   Pulse 78   Temp 98.5 F (36.9 C) (Oral)   Ht 5\' 1"  (1.549 m)   Wt 158 lb (71.7 kg)   SpO2 97%   BMI 29.85 kg/m   Vitals and nursing note reviewed  General: NAD, pleasant, able to participate in exam Cardiac: RRR, normal heart sounds, no murmurs. 2+ radial and PT pulses bilaterally Respiratory: CTAB, normal effort, No wheezes, rales or rhonchi Abdomen: soft, nontender, nondistended, no hepatic or splenomegaly, +BS Extremities: bilateral hyperpigmented lower extermities from knee to ankle, no erythema, warmth or drainage noted. Skin: warm and dry, no rashes noted Neuro: alert and oriented x4, no focal deficits Psych: Normal affect and mood   Assessment & Plan:   #T2DM, unconrtrolled Patient has  not taken her antus in almost two weeks because she ran out of needles. Patient does not follow a diabetic diet either making control very challenging. Patient BG have been averaging in the mid 200 to upper 200's. Some improvement noted in the past 2 weeks based on glucometer read.Given that patient has not follow therapy as prescribed, therefore it is difficulty to assess appropriateness of current drug therapy.Discussed with patient need for adherence for better management of DM. --Will continue Lantus 10 U daily --Will continue Byetta 5u  --CBG 4 times daily and record  --Will reevaluate and assess need to change therapy --Medications refilled  #Bilateral lower extremities skin hyperpigmentation Patient with symmetrical hyperpigmented lower extremities. Seem to be related to poorly control DM and elevated HTN as well as venous stasis with report of swollen leg and ankle. Patient would like a referral to dermatology for further evaluation --will refer patient to dermatology --Elevate legs for better circulation   #Dysphagia, ongoing Will need to obtain a release of record to have prior GI history before able to make an appointment. Patient continue to endorse some GERD symptoms accompanied with some difficulty swallowing solids and liquids. Endoscopy to r/o barrett's esophagus in the setting of long standing GERD. Malignancy and esophageal dysmotility. --Will follow up on GI appoinment  Marjie Skiff, MD Family Medicine Resident PGY-1

## 2016-11-25 ENCOUNTER — Telehealth: Payer: Self-pay | Admitting: Family Medicine

## 2016-11-25 NOTE — Telephone Encounter (Signed)
After speaking with Janett Billow about this concern she offered a Paediatric nurse $5 gift card for patient from our indigent fund.  Spoke with patient and offered my apology for any inconvenience on Friday due to her script.  Explained to patient that pharmacy did try to reach [place] to change script but we had already closed.  She voiced understanding and was thankful for our effort on trying to fix the error.  Patient is aware that gift card will be up front but plans to wait and pick it up next time she is here seeing patient's. Kya Mayfield,CMA

## 2016-11-25 NOTE — Telephone Encounter (Signed)
Pharmacy called and was checking to make sure that the new prescription for the needles 100 qty was the right prescription and if they can fill this. I told them that was the right prescription sent the second time and to fill that one since that is the correct one. jw

## 2016-11-25 NOTE — Telephone Encounter (Signed)
Kayla Gregory, Please let patient know we are sorry that she had to pay for 2 presciption for her needles, they will not expired and therefore she will not have to pay for them next time. If she still wants her 7$ back I will reimburse her at her next visit. Please let her know that I am unable to talk to her.  Marjie Skiff, PGY-1

## 2016-11-25 NOTE — Telephone Encounter (Signed)
Pt is upset because 2 prescriptions for her needles were sent to [facility]  As a result, she has had to pay an extra $7.  She says it was our error because 2 prescriptions were sent in and [facility] filled it and charged her $10.  With her medicaid, she only should have paid $3.   [facility] told her it was our fault that she had to pay the extra $7.  She would like to talk to her dr that sent in the 2 prescriptions. She would also like her $7 refund.

## 2016-11-28 NOTE — Telephone Encounter (Signed)
Helen can you please close this enounter °

## 2016-12-02 ENCOUNTER — Ambulatory Visit: Payer: Medicaid Other | Attending: Family Medicine | Admitting: Physical Therapy

## 2016-12-02 DIAGNOSIS — R293 Abnormal posture: Secondary | ICD-10-CM | POA: Diagnosis present

## 2016-12-02 DIAGNOSIS — M62838 Other muscle spasm: Secondary | ICD-10-CM | POA: Diagnosis present

## 2016-12-02 DIAGNOSIS — M6281 Muscle weakness (generalized): Secondary | ICD-10-CM | POA: Diagnosis present

## 2016-12-02 DIAGNOSIS — M25611 Stiffness of right shoulder, not elsewhere classified: Secondary | ICD-10-CM | POA: Insufficient documentation

## 2016-12-02 DIAGNOSIS — G8929 Other chronic pain: Secondary | ICD-10-CM | POA: Diagnosis present

## 2016-12-02 DIAGNOSIS — M25511 Pain in right shoulder: Secondary | ICD-10-CM | POA: Insufficient documentation

## 2016-12-02 NOTE — Therapy (Signed)
[place], [place], [address] Phone: [phone]   Fax:  [phone]  Physical Therapy Evaluation  Patient Details  Name: Kayla Gregory MRN: [id number] Date of Birth: June 28, 1961 Referring Provider: Stefanie Libel, MD  Encounter Date: 12/02/2016      PT End of Session - 12/02/16 1311    Visit Number 1   Number of Visits 1   Date for PT Re-Evaluation 12/03/16   PT Start Time 1497   PT Stop Time 1358   PT Time Calculation (min) 43 min   Activity Tolerance Patient limited by pain   Behavior During Therapy [place] for tasks assessed/performed      Past Medical History:  Diagnosis Date  . Diabetes mellitus without complication ([place]) 0263  . Hepatitis C, chronic ([place])   . Hyperlipidemia associated with type 2 diabetes mellitus ([place])   . Hypertension associated with diabetes ([place])   . Schizophrenia [facility])     Past Surgical History:  Procedure Laterality Date  . JOINT REPLACEMENT Left    Hip  . TONSILLECTOMY      There were no vitals filed for this visit.       Subjective Assessment - 12/02/16 1325    Subjective pt is a 56 y.o F with CC of R shoulder pain due to a fall and landing on the R shoulder that occurred 11/01/2016. Since onset the pain has gotten worse in the shoulder. Currently uses the sling only when she is out.  denies N/T that has mostly pain in the R shoulder.    Diagnostic tests MRI   Patient Stated Goals decrease    Currently in Pain? Yes   Pain Score 9   10/10 at worst   Pain Location Shoulder   Pain Orientation Right   Pain Descriptors / Indicators Constant;Throbbing   Pain Type Chronic pain   Pain Frequency Constant   Aggravating Factors  using the R arm, using the L arm   Pain Relieving Factors medication, rubbing with icy hot,             [facility] PT Assessment - 12/02/16 1306      Assessment   Medical Diagnosis R shoulder pain   Referring Provider Kayla Libel, MD    Onset Date/Surgical Date 11/01/16   Hand Dominance Left   Next MD Visit PRN   Prior Therapy yes  for L hp     Precautions   Required Braces or Orthoses Sling     Restrictions   Weight Bearing Restrictions No     Balance Screen   Has the patient fallen in the past 6 months Yes   How many times? 1   Has the patient had a decrease in activity level because of a fear of falling?  No   Is the patient reluctant to leave their home because of a fear of falling?  No     Home Environment   Living Environment Private residence   Living Arrangements Alone   Type of Micro Access Level entry   Home Layout One level   Home Equipment --  sling     Prior Function   Level of Independence Independent;Independent with basic ADLs   Vocation On disability   Leisure bowling     Cognition   Overall Cognitive Status Within Functional Limits for tasks assessed     Posture/Postural Control   Posture/Postural Control Postural limitations   Postural Limitations Rounded Shoulders;Forward head  ROM / Strength   AROM / PROM / Strength AROM;PROM;Strength     AROM   AROM Assessment Site Shoulder   Right/Left Shoulder Right   Right Shoulder Extension 23 Degrees   Right Shoulder Flexion 60 Degrees   Right Shoulder ABduction 68 Degrees   Right Shoulder Internal Rotation --  to stomach   Right Shoulder External Rotation 10 Degrees     PROM   Overall PROM  Due to pain  significant guarding limiting PROM   PROM Assessment Site Shoulder   Right/Left Shoulder Right     Strength   Overall Strength Unable to assess;Other (comment)  significant guarding limiting PROM   Strength Assessment Site Shoulder   Right/Left Shoulder Right     Palpation   Palpation comment tenderness at the greater tubercle, and deltoid tuberosity, R upper trap/ levator scapulae with signifcant tightness and multiple trigger points.  pain in the ulna styloid process.                            PT Education - 12/02/16 1311    Education provided Yes   Education Details evaluation findings, goals, HEP with proper form/ rationale   Person(s) Educated Patient   Methods Explanation;Verbal cues;Handout   Comprehension Verbalized understanding;Verbal cues required                    Plan - 12/02/16 1356    Clinical Impression Statement Kayla Gregory presents to OPPT as a moderate comlexity evaluation based on involved PMHx, worsening pain, and exam findings regard R shoulder pain due to a fall on 11/01/2016. She demos significant limited AROM and significant guarding with PROM. unable to assess strength due to pt reported pain and limited effort. provived pt with HEP and explained proper form/ and rationale. Medicaid covers 1 visit and pt opted not for self pay. provided [facility] clinic handout and HEP. She would benefit from physical therapy to decrease R shoulder pain, improve mobility and strength and maximize her function.    PT Frequency One time visit   PT Next Visit Plan Medicaid 1 x visit   PT Home Exercise Plan see pt instruction section   Consulted and Agree with Plan of Care Patient      Patient will benefit from skilled therapeutic intervention in order to improve the following deficits and impairments:  Impaired UE functional use, Increased muscle spasms, Increased fascial restricitons, Decreased range of motion, Decreased strength, Decreased endurance, Decreased activity tolerance, Improper body mechanics, Postural dysfunction, Hypomobility  Visit Diagnosis: Chronic right shoulder pain - Plan: PT plan of care cert/re-cert  Stiffness of right shoulder, not elsewhere classified - Plan: PT plan of care cert/re-cert  Muscle weakness (generalized) - Plan: PT plan of care cert/re-cert  Abnormal posture - Plan: PT plan of care cert/re-cert  Other muscle spasm - Plan: PT plan of care cert/re-cert     Problem List Patient  Active Problem List   Diagnosis Date Noted  . Acute pain of right shoulder 11/20/2016  . Vomiting 10/09/2016  . Cough 10/09/2016  . Chest pain 10/07/2016  . Trigger finger 10/07/2016  . Great toe pain 05/10/2016  . Right ear pain 03/31/2016  . Partial thickness burn of ankle 02/02/2016  . Healthcare maintenance 12/28/2015  . Stable angina ([place]) 12/22/2015  . Type 2 diabetes mellitus ([place]) 12/22/2015  . Hypertension associated with diabetes ([place]) 12/22/2015  . GERD (gastroesophageal reflux disease) 12/22/2015  . Hepatitis,  chronic ([place]) 12/22/2015    [place] 12/02/2016, 2:04 PM  [facility] [address] [place], [place], [address] Phone: [phone]   Fax:  [phone]  Name: Kayla Gregory MRN: [id number] Date of Birth: May 05, 1961

## 2016-12-05 LAB — HM DIABETES EYE EXAM

## 2016-12-10 ENCOUNTER — Telehealth: Payer: Self-pay | Admitting: *Deleted

## 2016-12-11 LAB — HM DIABETES EYE EXAM

## 2016-12-11 NOTE — Telephone Encounter (Signed)
Called pt to find out why is she out of the Mobic so early. She is requesting a refill on Amoxicillin, which we did not give her. Informed her she will need to find out who gave her that med and have that prescriber refill it. She did state that she can not afford to continue PT and I suggested coming back in and following up to see if we can prescribe or give her some different home exercises and patient said she will call back.

## 2016-12-13 ENCOUNTER — Telehealth: Payer: Self-pay | Admitting: Family Medicine

## 2016-12-13 DIAGNOSIS — Z0000 Encounter for general adult medical examination without abnormal findings: Secondary | ICD-10-CM

## 2016-12-13 MED ORDER — METHOCARBAMOL 500 MG PO TABS
500.0000 mg | ORAL_TABLET | ORAL | 0 refills | Status: DC | PRN
Start: 2016-12-13 — End: 2017-02-03

## 2016-12-13 NOTE — Telephone Encounter (Signed)
Pt called because she needs a referral to have a mammogram. She also needs a refill on Robaxin 500 mg. She was referred to PT, but that doctor said it would cost $300.00 a day and Medicaid will not pay for that. jw

## 2016-12-16 NOTE — Telephone Encounter (Signed)
Pt informed of rx ready for pick up. Pt will be here later this afternoon.

## 2016-12-19 ENCOUNTER — Other Ambulatory Visit: Payer: Self-pay | Admitting: Family Medicine

## 2016-12-19 DIAGNOSIS — E1100 Type 2 diabetes mellitus with hyperosmolarity without nonketotic hyperglycemic-hyperosmolar coma (NKHHC): Secondary | ICD-10-CM

## 2016-12-19 DIAGNOSIS — Z794 Long term (current) use of insulin: Secondary | ICD-10-CM

## 2016-12-19 MED ORDER — GLUCOSE BLOOD VI STRP: ORAL_STRIP | 12 refills | Status: DC

## 2016-12-19 MED ORDER — ACCU-CHEK AVIVA PLUS W/DEVICE KIT: 1.0000 "application " | PACK | Freq: Three times a day (TID) | 0 refills | Status: DC

## 2016-12-19 NOTE — Telephone Encounter (Signed)
Pt  calling to request refill of:  Name of Medication(s):  Glucose meter and test strips Last date of OV:  11-22-16 Pharmacy:  [facility]  Will route refill request to Clinic RN.  Discussed with patient policy to call pharmacy for future refills.  Also, discussed refills may take up to 48 hours to approve or deny.  Renella Cunas

## 2016-12-27 ENCOUNTER — Ambulatory Visit (INDEPENDENT_AMBULATORY_CARE_PROVIDER_SITE_OTHER): Payer: Medicaid Other | Admitting: Family Medicine

## 2016-12-27 ENCOUNTER — Encounter: Payer: Self-pay | Admitting: Family Medicine

## 2016-12-27 VITALS — BP 110/64 | HR 81 | Temp 98.8°F | Wt 159.0 lb

## 2016-12-27 DIAGNOSIS — M25512 Pain in left shoulder: Secondary | ICD-10-CM

## 2016-12-27 DIAGNOSIS — G8929 Other chronic pain: Secondary | ICD-10-CM | POA: Diagnosis not present

## 2016-12-27 DIAGNOSIS — M25511 Pain in right shoulder: Secondary | ICD-10-CM | POA: Diagnosis not present

## 2016-12-27 MED ORDER — DICLOFENAC SODIUM 1 % TD GEL: 4.0000 g | Freq: Four times a day (QID) | TRANSDERMAL | 1 refills | Status: DC

## 2016-12-27 MED ORDER — CYCLOBENZAPRINE HCL 10 MG PO TABS: 10.0000 mg | ORAL_TABLET | Freq: Three times a day (TID) | ORAL | 0 refills | Status: DC | PRN

## 2016-12-27 NOTE — Patient Instructions (Signed)
It was great seeing you today! We have addressed the following issues today  1. I will send you for an MRI of your shoulder will discuss the results at our next visit. 2. I prescribed flexeril and voltaren gel to use on your shoulder. 3. I will see you in 2 to 3 weeks for follow up   If we did any lab work today, and the results require attention, either me or my nurse will get in touch with you. If everything is normal, you will get a letter in mail and a message via . If you don't hear from [place] in two weeks, please give [place] a call. Otherwise, we look forward to seeing you again at your next visit. If you have any questions or concerns before then, please call the clinic at [phone].  Please bring all your medications to every doctors visit  Sign up for My Chart to have easy access to your labs results, and communication with your Primary care physician.    Please check-out at the front desk before leaving the clinic.    Take Care,   Dr. Andy Gauss

## 2016-12-28 NOTE — Progress Notes (Signed)
   Subjective:    Patient ID: Kayla Gregory, female    DOB: October 04, 1960, 56 y.o.   MRN: [id number]  CC: Follow up on chronic right shoulder pain  HPI: Patient is 56 yo female with a complex past medical history who present today to follow up on left shoulder pain after pain sustained during a fall in March 2018. In the cafeteria at [facility]. Patient has been seen multiple times in clinic for the same problems and was recently refer to sport medicine. Patient was referred to physical therapy, however patient reports she has been unable to go because she cannot afford it. Patient was given exercise for her shoulder and she has been trying to do them at home for the past week. Patient reports she has not been using the shoulder sling as much after recommendation from sport medicine. Patient was also prescribed Meloxicam but denies any relief in her symptoms. Patient still endorses limited functionality secondary to pain.  Smoking status reviewed   ROS: all other systems were reviewed and are negative other than in the HPI   Past medical history, surgical, family, and social history reviewed and updated in the EMR as appropriate.  Objective:  BP 110/64   Pulse 81   Temp 98.8 F (37.1 C) (Oral)   Wt 159 lb (72.1 kg)   SpO2 98%   BMI 30.04 kg/m   Vitals and nursing note reviewed  General: NAD, pleasant, able to participate in exam Cardiac: RRR, normal heart sounds, no murmurs. 2+ radial and PT pulses bilaterally Respiratory: CTAB, normal effort, No wheezes, rales or rhonchi Abdomen: soft, nontender, nondistended, no hepatic or splenomegaly, +BS Extremities: no edema or cyanosis. WWP.   Ortho Exam Limited abduction and adduction (passive and active) due to pain. Appropriate internal rotation, very limited external rotation. Patient has good strength,. No cervical tenderness Skin: warm and dry, no rashes noted Neuro: alert and oriented x4, no focal deficits Psych: Normal affect and  mood   Assessment & Plan:   #Right shoulder pain, chronic, stable Patient continue to complain of right shoulder pain with limited functionality. Patient with no structural damage seen on imaging, sports medicine assessment consistent with possible contusion. Anti inflammatory (Meloxicam) and muscle relaxant (robaxin) have been unsuccessful. Patient would most benefit from physical therapy but has been unable to attend. Patient will continue with home exercise. Given some trapezius tenderness and continue use of the shoulder sling, patient might be developing capsulitis. --Order MRI of right shoulder to rule out any structural damage --Prescribe Flexeril 10 mg tid as needed   --Voltaren gel 4 times a day as needed --Discontinue shoulder sling --Continue home exercise --Continue with NSAIDs as needed. --Patient will follow up in about three weeks or sooner as needed   Marjie Skiff, MD [facility] Medicine Resident PGY-1

## 2016-12-30 ENCOUNTER — Telehealth: Payer: Self-pay | Admitting: Family Medicine

## 2016-12-30 ENCOUNTER — Other Ambulatory Visit: Payer: Self-pay | Admitting: Family Medicine

## 2016-12-30 NOTE — Addendum Note (Signed)
Addended by: Valerie Roys on: 12/30/2016 11:17 AM   Modules accepted: Orders

## 2016-12-30 NOTE — Telephone Encounter (Signed)
Pt is calling because she said that [facility] put the wrong address on all her medications. She said that she cannot take the medication because it has an address from 9 years on it. She would like the doctor to call her new medications in on everything. She would also like someone to call her when this is corrected. She said that she knows we didn't make the mistake and [facility] did this, but still wants [place] to call [facility] and get this straighten out. jw

## 2016-12-30 NOTE — Telephone Encounter (Signed)
Spoke with pharmacy and confirmed that they have the same address as [place] in their computer system.  Tech was unable to tell if there was an issue with patient getting a script with her old address on it.  Also he said that patient did pick up her medication from them on Friday.  I called patient to let her know this information but patient was not understanding of this and would not allow me to explain myself and what I had found out. Patient continued to talk over me and was speaking very loudly even after I asked her to please lower her voice and to let me talk.  Patient was constantly repeating her "old address and that [facility] messed up and didn't want to admit it".  Also she mentioned that it was "against the law" to give out prescriptions with the wrong address.  I advised patient that I wouldn't be able to keep talking to her if she didn't stop speaking so loud.  I informed patient that since she picked up her medication that if we sent in a new prescription that it would have to be covered out of her pocket due to the insurance already paying for it last week and there is a chance that they wouldn't fill it anyway.  Patient continued to speak loudly that she hadn't taken the medication due to it having the wrong address, but I told her that it didn't matter because the pharmacy doesn't take back medication.  Patient then hung up after mentioning "maybe we just need to be sued" and hung up.  I informed clinic team lead of this, PCPflee and also will send to assistant director in case patient calls back again. Ching Rabideau,CMA

## 2016-12-30 NOTE — Telephone Encounter (Signed)
I briefly talked to the patient and [place] was able to change her address. Patient also assure me that she will be taking her medications.   Thank You Marjie Skiff, PGY-1

## 2017-01-02 ENCOUNTER — Ambulatory Visit (HOSPITAL_COMMUNITY)
Admission: RE | Admit: 2017-01-02 | Discharge: 2017-01-02 | Disposition: A | Discharge status: Home / Self Care | Payer: Medicaid Other | Source: Ambulatory Visit | Attending: Family Medicine | Admitting: Family Medicine

## 2017-01-02 DIAGNOSIS — M7581 Other shoulder lesions, right shoulder: Secondary | ICD-10-CM | POA: Insufficient documentation

## 2017-01-02 DIAGNOSIS — M19011 Primary osteoarthritis, right shoulder: Secondary | ICD-10-CM | POA: Insufficient documentation

## 2017-01-02 DIAGNOSIS — G8929 Other chronic pain: Secondary | ICD-10-CM

## 2017-01-02 DIAGNOSIS — M25511 Pain in right shoulder: Secondary | ICD-10-CM | POA: Diagnosis present

## 2017-01-02 LAB — CREATININE, SERUM
Creatinine, Ser: 1.24 mg/dL — ABNORMAL HIGH (ref 0.44–1.00)
GFR calc Af Amer: 54 mL/min — ABNORMAL LOW (ref >60)
GFR calc non Af Amer: 47 mL/min — ABNORMAL LOW (ref >60)

## 2017-01-02 MED ORDER — GADOBENATE DIMEGLUMINE 529 MG/ML IV SOLN
15.0000 mL | Freq: Once | INTRAVENOUS | Status: AC | PRN
  Administered 2017-01-02: 15 mL via INTRAVENOUS

## 2017-01-08 ENCOUNTER — Other Ambulatory Visit: Payer: Self-pay | Admitting: Family Medicine

## 2017-01-08 NOTE — Telephone Encounter (Signed)
Pharmacy called and verified prescription is ready for pick up, pt has been informed.

## 2017-01-08 NOTE — Telephone Encounter (Signed)
Pt called and said that she needs a refill on her Protonix. It shows we sent this in 12/30/16, but pharmacy stating they didn't receive anything from [place]. Can we call or fax again. jw

## 2017-01-09 LAB — HM DIABETES EYE EXAM

## 2017-01-14 ENCOUNTER — Telehealth: Payer: Self-pay | Admitting: Family Medicine

## 2017-01-14 NOTE — Telephone Encounter (Signed)
Discussed with patient MRI findings. Patient stopped taking Flexeril because she does not feel that it is helping her symptoms. She has an appointment with me on 01/28/2017 and would like to discuss physical therapy. Patient appreciative of phone call.  Marjie Skiff, PGY-1 Family Medicine

## 2017-01-14 NOTE — Telephone Encounter (Signed)
Pt has shoulder pain, pt did not take pills she was given. Pt did use the cream, but it is not helping. Pt denied appointment. Pt would like PCP to call her. ep

## 2017-01-15 LAB — HM DIABETES EYE EXAM

## 2017-01-20 ENCOUNTER — Ambulatory Visit: Payer: Medicaid Other | Admitting: Podiatry

## 2017-01-21 ENCOUNTER — Ambulatory Visit: Payer: Medicaid Other | Admitting: Family Medicine

## 2017-01-27 ENCOUNTER — Ambulatory Visit: Payer: Medicaid Other | Admitting: Podiatry

## 2017-01-28 ENCOUNTER — Encounter: Payer: Self-pay | Admitting: Family Medicine

## 2017-01-28 ENCOUNTER — Ambulatory Visit (INDEPENDENT_AMBULATORY_CARE_PROVIDER_SITE_OTHER): Payer: Medicaid Other | Admitting: Family Medicine

## 2017-01-28 VITALS — BP 138/84 | HR 84 | Temp 97.8°F | Ht 61.0 in | Wt 166.0 lb

## 2017-01-28 DIAGNOSIS — N941 Unspecified dyspareunia: Secondary | ICD-10-CM

## 2017-01-28 DIAGNOSIS — E1100 Type 2 diabetes mellitus with hyperosmolarity without nonketotic hyperglycemic-hyperosmolar coma (NKHHC): Secondary | ICD-10-CM | POA: Diagnosis not present

## 2017-01-28 DIAGNOSIS — Z794 Long term (current) use of insulin: Secondary | ICD-10-CM

## 2017-01-28 LAB — POCT GLYCOSYLATED HEMOGLOBIN (HGB A1C): Hemoglobin A1C: 6.7

## 2017-01-28 MED ORDER — K-Y LUBRICANT JELLY SENSITIVE EX GEL: CUTANEOUS | 0 refills | Status: DC

## 2017-01-28 NOTE — Progress Notes (Signed)
   Subjective:    Patient ID: Kayla Gregory, female    DOB: 11/15/1960, 56 y.o.   MRN: [id number]   CC: T2DM follow up  HPI: Patient is a 56 year old female with complex past medial history who present today to discuss:  -T2DM: Patient last A1c was 9.7 and she has made some lifestyle changes and has been more adherent to her medication regimen. A1c is due today.  -Pain with intercourse and vaginal dryness: Patient has been celibate for the past few years due to pain with intercourse. She is post menopausal and has endorsed some gain with intercourse. Patient was prescribed a pill when she was living in [place] abut was told it increases her chance for cancer. Patient then stopped taking them.   -Shoulder pain, chronic: Patient has been doing her home exercise for her frozen shoulder and reports improvement in her pain for the past few days. Pateint was prescibed flexeril during last visit but reports that it was not working for her and stopped takig them. Patient brought pills today   Smoking status reviewed   ROS: all other systems were reviewed and are negative other than in the HPI  Past Medical History:  Diagnosis Date  . Diabetes mellitus without complication ([place]) 8119  . Hepatitis C, chronic ([place])   . Hyperlipidemia associated with type 2 diabetes mellitus ([place])   . Hypertension associated with diabetes ([place])   . Schizophrenia [facility])    Past Surgical History:  Procedure Laterality Date  . JOINT REPLACEMENT Left    Hip  . TONSILLECTOMY       Objective:  BP 138/84   Pulse 84   Temp 97.8 F (36.6 C) (Oral)   Ht 5\' 1"  (1.549 m)   Wt 166 lb (75.3 kg)   SpO2 94%   BMI 31.37 kg/m   Vitals and nursing note reviewed  General: NAD, pleasant, able to participate in exam Cardiac: RRR, normal heart sounds, no murmurs. 2+ radial and PT pulses bilaterally Respiratory: CTAB, normal effort, No wheezes, rales or rhonchi Abdomen: soft, nontender, nondistended, no hepatic or  splenomegaly, +BS Extremities: left arm in sling ofr Skin: warm and dry, no rashes noted Neuro: alert and oriented x4, no focal deficits Psych: Normal affect and mood   Assessment & Plan:   #T2DM, chronic, treated Today A1c is 6.7 down from 9.7. Great improvement, patient has made some therapeutic lifestyle changes and is more medications adherent. Encourage patient to continue good work. Will continue current regimen. --Will continue Lantus 10 U daily --Will continue [place] 5u  --CBG 4 times daily and record   #Dyspareunia, untreated  Likely secondary to postmenopausal hormonal change. Patient currently oppose to hormonal solution whether oral or vaginal due to risk for cancer. --Will prescribe KY jelly, water based lubricant to avoid irritation and to be used prn --Will reevaluate at next visit.  #Capsulitis, chronic Patient report some improvement in her pain since she has been doing the shoulder exercise more frequently. MRI showed slightly thickened appearance of the joint capsule along the axillary recess may represent changes of an adhesive capsulitis. Patient is not able to afford PT.  --Will continue with home exercise --Gave shoulder exercise hand out to patient and encourage continuation of her current therapy regimen.   Marjie Skiff, MD Family Medicine Resident PGY-1

## 2017-01-28 NOTE — Progress Notes (Signed)
poc

## 2017-01-28 NOTE — Patient Instructions (Signed)
It was great seeing you today! We have addressed the following issues today  1. Keep up the good work, your A1c has greatly improved. 2. I send a water based lubricant to your pharmacy let me know if it is helpful with your symptoms. 3. I will see you in about three months or sooner as needed   If we did any lab work today, and the results require attention, either me or my nurse will get in touch with you. If everything is normal, you will get a letter in mail and a message via . If you don't hear from [place] in two weeks, please give [place] a call. Otherwise, we look forward to seeing you again at your next visit. If you have any questions or concerns before then, please call the clinic at [phone].  Please bring all your medications to every doctors visit  Sign up for My Chart to have easy access to your labs results, and communication with your Primary care physician. Please ask Front Desk for some assistance.   Please check-out at the front desk before leaving the clinic.    Take Care,   Dr. Andy Gauss   Shoulder Exercises Ask your health care provider which exercises are safe for you. Do exercises exactly as told by your health care provider and adjust them as directed. It is normal to feel mild stretching, pulling, tightness, or discomfort as you do these exercises, but you should stop right away if you feel sudden pain or your pain gets worse.Do not begin these exercises until told by your health care provider. RANGE OF MOTION EXERCISES These exercises warm up your muscles and joints and improve the movement and flexibility of your shoulder. These exercises also help to relieve pain, numbness, and tingling. These exercises involve stretching your injured shoulder directly. Exercise A: Pendulum  1. Stand near a wall or a surface that you can hold onto for balance. 2. Bend at the waist and let your left / right arm hang straight down. Use your other arm to support you. Keep your back  straight and do not lock your knees. 3. Relax your left / right arm and shoulder muscles, and move your hips and your trunk so your left / right arm swings freely. Your arm should swing because of the motion of your body, not because you are using your arm or shoulder muscles. 4. Keep moving your body so your arm swings in the following directions, as told by your health care provider: ? Side to side. ? Forward and backward. ? In clockwise and counterclockwise circles. 5. Continue each motion for __________ seconds, or for as long as told by your health care provider. 6. Slowly return to the starting position. Repeat __________ times. Complete this exercise __________ times a day. Exercise B:Flexion, Standing  1. Stand and hold a broomstick, a cane, or a similar object. Place your hands a little more than shoulder-width apart on the object. Your left / right hand should be palm-up, and your other hand should be palm-down. 2. Keep your elbow straight and keep your shoulder muscles relaxed. Push the stick down with your healthy arm to raise your left / right arm in front of your body, and then over your head until you feel a stretch in your shoulder. ? Avoid shrugging your shoulder while you raise your arm. Keep your shoulder blade tucked down toward the middle of your back. 3. Hold for __________ seconds. 4. Slowly return to the starting position.  Repeat __________ times. Complete this exercise __________ times a day. Exercise C: Abduction, Standing 1. Stand and hold a broomstick, a cane, or a similar object. Place your hands a little more than shoulder-width apart on the object. Your left / right hand should be palm-up, and your other hand should be palm-down. 2. While keeping your elbow straight and your shoulder muscles relaxed, push the stick across your body toward your left / right side. Raise your left / right arm to the side of your body and then over your head until you feel a stretch in  your shoulder. ? Do not raise your arm above shoulder height, unless your health care provider tells you to do that. ? Avoid shrugging your shoulder while you raise your arm. Keep your shoulder blade tucked down toward the middle of your back. 3. Hold for __________ seconds. 4. Slowly return to the starting position. Repeat __________ times. Complete this exercise __________ times a day. Exercise D:Internal Rotation  1. Place your left / right hand behind your back, palm-up. 2. Use your other hand to dangle an exercise band, a towel, or a similar object over your shoulder. Grasp the band with your left / right hand so you are holding onto both ends. 3. Gently pull up on the band until you feel a stretch in the front of your left / right shoulder. ? Avoid shrugging your shoulder while you raise your arm. Keep your shoulder blade tucked down toward the middle of your back. 4. Hold for __________ seconds. 5. Release the stretch by letting go of the band and lowering your hands. Repeat __________ times. Complete this exercise __________ times a day. STRETCHING EXERCISES These exercises warm up your muscles and joints and improve the movement and flexibility of your shoulder. These exercises also help to relieve pain, numbness, and tingling. These exercises are done using your healthy shoulder to help stretch the muscles of your injured shoulder. Exercise E: Warehouse manager (External Rotation and Abduction)  1. Stand in a doorway with one of your feet slightly in front of the other. This is called a staggered stance. If you cannot reach your forearms to the door frame, stand facing a corner of a room. 2. Choose one of the following positions as told by your health care provider: ? Place your hands and forearms on the door frame above your head. ? Place your hands and forearms on the door frame at the height of your head. ? Place your hands on the door frame at the height of your elbows. 3. Slowly  move your weight onto your front foot until you feel a stretch across your chest and in the front of your shoulders. Keep your head and chest upright and keep your abdominal muscles tight. 4. Hold for __________ seconds. 5. To release the stretch, shift your weight to your back foot. Repeat __________ times. Complete this stretch __________ times a day. Exercise F:Extension, Standing 1. Stand and hold a broomstick, a cane, or a similar object behind your back. ? Your hands should be a little wider than shoulder-width apart. ? Your palms should face away from your back. 2. Keeping your elbows straight and keeping your shoulder muscles relaxed, move the stick away from your body until you feel a stretch in your shoulder. ? Avoid shrugging your shoulders while you move the stick. Keep your shoulder blade tucked down toward the middle of your back. 3. Hold for __________ seconds. 4. Slowly return to the starting position.  Repeat __________ times. Complete this exercise __________ times a day. STRENGTHENING EXERCISES These exercises build strength and endurance in your shoulder. Endurance is the ability to use your muscles for a long time, even after they get tired. Exercise G:External Rotation  1. Sit in a stable chair without armrests. 2. Secure an exercise band at elbow height on your left / right side. 3. Place a soft object, such as a folded towel or a small pillow, between your left / right upper arm and your body to move your elbow a few inches away (about 10 cm) from your side. 4. Hold the end of the band so it is tight and there is no slack. 5. Keeping your elbow pressed against the soft object, move your left / right forearm out, away from your abdomen. Keep your body steady so only your forearm moves. 6. Hold for __________ seconds. 7. Slowly return to the starting position. Repeat __________ times. Complete this exercise __________ times a day. Exercise H:Shoulder  Abduction  1. Sit in a stable chair without armrests, or stand. 2. Hold a __________ weight in your left / right hand, or hold an exercise band with both hands. 3. Start with your arms straight down and your left / right palm facing in, toward your body. 4. Slowly lift your left / right hand out to your side. Do not lift your hand above shoulder height unless your health care provider tells you that this is safe. ? Keep your arms straight. ? Avoid shrugging your shoulder while you do this movement. Keep your shoulder blade tucked down toward the middle of your back. 5. Hold for __________ seconds. 6. Slowly lower your arm, and return to the starting position. Repeat __________ times. Complete this exercise __________ times a day. Exercise I:Shoulder Extension 1. Sit in a stable chair without armrests, or stand. 2. Secure an exercise band to a stable object in front of you where it is at shoulder height. 3. Hold one end of the exercise band in each hand. Your palms should face each other. 4. Straighten your elbows and lift your hands up to shoulder height. 5. Step back, away from the secured end of the exercise band, until the band is tight and there is no slack. 6. Squeeze your shoulder blades together as you pull your hands down to the sides of your thighs. Stop when your hands are straight down by your sides. Do not let your hands go behind your body. 7. Hold for __________ seconds. 8. Slowly return to the starting position. Repeat __________ times. Complete this exercise __________ times a day. Exercise J:Standing Shoulder Row 1. Sit in a stable chair without armrests, or stand. 2. Secure an exercise band to a stable object in front of you so it is at waist height. 3. Hold one end of the exercise band in each hand. Your palms should be in a thumbs-up position. 4. Bend each of your elbows to an "L" shape (about 90 degrees) and keep your upper arms at your sides. 5. Step back until the  band is tight and there is no slack. 6. Slowly pull your elbows back behind you. 7. Hold for __________ seconds. 8. Slowly return to the starting position. Repeat __________ times. Complete this exercise __________ times a day. Exercise K:Shoulder Press-Ups  1. Sit in a stable chair that has armrests. Sit upright, with your feet flat on the floor. 2. Put your hands on the armrests so your elbows are bent and your  fingers are pointing forward. Your hands should be about even with the sides of your body. 3. Push down on the armrests and use your arms to lift yourself off of the chair. Straighten your elbows and lift yourself up as much as you comfortably can. ? Move your shoulder blades down, and avoid letting your shoulders move up toward your ears. ? Keep your feet on the ground. As you get stronger, your feet should support less of your body weight as you lift yourself up. 4. Hold for __________ seconds. 5. Slowly lower yourself back into the chair. Repeat __________ times. Complete this exercise __________ times a day. Exercise L: Wall Push-Ups  1. Stand so you are facing a stable wall. Your feet should be about one arm-length away from the wall. 2. Lean forward and place your palms on the wall at shoulder height. 3. Keep your feet flat on the floor as you bend your elbows and lean forward toward the wall. 4. Hold for __________ seconds. 5. Straighten your elbows to push yourself back to the starting position. Repeat __________ times. Complete this exercise __________ times a day. This information is not intended to replace advice given to you by your health care provider. Make sure you discuss any questions you have with your health care provider. Document Released: 06/19/2005 Document Revised: 04/29/2016 Document Reviewed: 04/16/2015 Elsevier Interactive Patient Education  2018 [employer].

## 2017-02-02 ENCOUNTER — Other Ambulatory Visit: Payer: Self-pay | Admitting: Pulmonary Disease

## 2017-02-02 DIAGNOSIS — E1100 Type 2 diabetes mellitus with hyperosmolarity without nonketotic hyperglycemic-hyperosmolar coma (NKHHC): Secondary | ICD-10-CM

## 2017-02-02 DIAGNOSIS — Z794 Long term (current) use of insulin: Secondary | ICD-10-CM

## 2017-02-03 ENCOUNTER — Other Ambulatory Visit: Payer: Self-pay | Admitting: Family Medicine

## 2017-02-03 DIAGNOSIS — M25512 Pain in left shoulder: Secondary | ICD-10-CM

## 2017-02-03 DIAGNOSIS — E1169 Type 2 diabetes mellitus with other specified complication: Secondary | ICD-10-CM

## 2017-02-03 DIAGNOSIS — M25511 Pain in right shoulder: Secondary | ICD-10-CM

## 2017-02-03 DIAGNOSIS — I208 Other forms of angina pectoris: Secondary | ICD-10-CM

## 2017-02-03 DIAGNOSIS — G8929 Other chronic pain: Secondary | ICD-10-CM

## 2017-02-03 DIAGNOSIS — E1100 Type 2 diabetes mellitus with hyperosmolarity without nonketotic hyperglycemic-hyperosmolar coma (NKHHC): Secondary | ICD-10-CM

## 2017-02-03 DIAGNOSIS — I2089 Other forms of angina pectoris: Secondary | ICD-10-CM

## 2017-02-03 DIAGNOSIS — Z794 Long term (current) use of insulin: Secondary | ICD-10-CM

## 2017-02-03 DIAGNOSIS — N941 Unspecified dyspareunia: Secondary | ICD-10-CM

## 2017-02-03 DIAGNOSIS — L299 Pruritus, unspecified: Secondary | ICD-10-CM

## 2017-02-03 DIAGNOSIS — E785 Hyperlipidemia, unspecified: Secondary | ICD-10-CM

## 2017-02-03 MED ORDER — CYCLOBENZAPRINE HCL 10 MG PO TABS: 10.0000 mg | ORAL_TABLET | Freq: Three times a day (TID) | ORAL | 0 refills | Status: DC | PRN

## 2017-02-03 MED ORDER — NOVOLOG 100 UNIT/ML ~~LOC~~ SOLN: 10.0000 [IU] | Freq: Three times a day (TID) | SUBCUTANEOUS | 5 refills | Status: DC

## 2017-02-03 MED ORDER — ONDANSETRON 4 MG PO TBDP: 4.0000 mg | ORAL_TABLET | Freq: Three times a day (TID) | ORAL | 0 refills | Status: DC | PRN

## 2017-02-03 MED ORDER — GABAPENTIN 300 MG PO CAPS: 300.0000 mg | ORAL_CAPSULE | Freq: Three times a day (TID) | ORAL | 2 refills | Status: DC

## 2017-02-03 MED ORDER — MELOXICAM 15 MG PO TABS: 15.0000 mg | ORAL_TABLET | Freq: Every day | ORAL | 1 refills | Status: DC

## 2017-02-03 MED ORDER — PANTOPRAZOLE SODIUM 40 MG PO TBEC: 40.0000 mg | DELAYED_RELEASE_TABLET | Freq: Every day | ORAL | 1 refills | Status: DC

## 2017-02-03 MED ORDER — EXENATIDE 5 MCG/0.02ML ~~LOC~~ SOPN: 5.0000 ug | PEN_INJECTOR | Freq: Two times a day (BID) | SUBCUTANEOUS | 1 refills | Status: DC

## 2017-02-03 MED ORDER — AMLODIPINE BESYLATE 10 MG PO TABS: 10.0000 mg | ORAL_TABLET | Freq: Every day | ORAL | 11 refills | Status: DC

## 2017-02-03 MED ORDER — ASPIRIN 81 MG PO TBEC: 81.0000 mg | DELAYED_RELEASE_TABLET | Freq: Every day | ORAL | 2 refills | Status: DC

## 2017-02-03 MED ORDER — DIPHENHYDRAMINE HCL 25 MG PO CAPS: 25.0000 mg | ORAL_CAPSULE | Freq: Every evening | ORAL | 0 refills | Status: DC | PRN

## 2017-02-03 MED ORDER — K-Y LUBRICANT JELLY SENSITIVE EX GEL: CUTANEOUS | 0 refills | Status: DC

## 2017-02-03 MED ORDER — ATORVASTATIN CALCIUM 40 MG PO TABS: 40.0000 mg | ORAL_TABLET | Freq: Every day | ORAL | 3 refills | Status: DC

## 2017-02-03 MED ORDER — INSULIN GLARGINE 100 UNIT/ML ~~LOC~~ SOLN: 30.0000 [IU] | Freq: Every day | SUBCUTANEOUS | 3 refills | Status: DC

## 2017-02-03 MED ORDER — METHOCARBAMOL 500 MG PO TABS: 500.0000 mg | ORAL_TABLET | ORAL | 0 refills | Status: DC | PRN

## 2017-02-03 MED ORDER — CETIRIZINE HCL 10 MG PO TABS: ORAL_TABLET | ORAL | 1 refills | Status: DC

## 2017-02-03 MED ORDER — DICLOFENAC SODIUM 1 % TD GEL: 4.0000 g | Freq: Four times a day (QID) | TRANSDERMAL | 1 refills | Status: DC

## 2017-02-03 NOTE — Telephone Encounter (Signed)
Pt needs all her meds refilled. She needs her insulin refilled asap because she used her last dose last night. She says she cant wait until tomorrow to get her insulin.

## 2017-02-03 NOTE — Telephone Encounter (Signed)
LM for patient informing her that scripts have been sent to the pharmacy. Sheila Gervasi,CMA

## 2017-02-03 NOTE — Telephone Encounter (Signed)
She wasn't happy when I told her it might take 24 hrs to get the insulin refilled.  She uses the Paediatric nurse at [facility].  She also asked for Dr Juanetta Snow to call her

## 2017-02-03 NOTE — Telephone Encounter (Signed)
Please let patient know that I am not in clinic, but I refilled ALL her meds including her insulin and they available for pick up at her [  Marjie Skiff, PGY-1

## 2017-02-05 ENCOUNTER — Encounter: Payer: Self-pay | Admitting: Podiatry

## 2017-02-05 ENCOUNTER — Ambulatory Visit (INDEPENDENT_AMBULATORY_CARE_PROVIDER_SITE_OTHER): Payer: Medicaid Other | Admitting: Podiatry

## 2017-02-05 ENCOUNTER — Ambulatory Visit (INDEPENDENT_AMBULATORY_CARE_PROVIDER_SITE_OTHER): Payer: Medicaid Other

## 2017-02-05 DIAGNOSIS — M2041 Other hammer toe(s) (acquired), right foot: Secondary | ICD-10-CM

## 2017-02-05 DIAGNOSIS — M2011 Hallux valgus (acquired), right foot: Secondary | ICD-10-CM | POA: Diagnosis not present

## 2017-02-05 DIAGNOSIS — M216X9 Other acquired deformities of unspecified foot: Secondary | ICD-10-CM

## 2017-02-05 DIAGNOSIS — M2042 Other hammer toe(s) (acquired), left foot: Secondary | ICD-10-CM | POA: Diagnosis not present

## 2017-02-05 DIAGNOSIS — M21619 Bunion of unspecified foot: Secondary | ICD-10-CM

## 2017-02-05 NOTE — Patient Instructions (Signed)

## 2017-02-09 NOTE — Progress Notes (Signed)
Subjective:  Patient presents today for follow-up evaluation of pain and tenderness of the right foot secondary to hammertoes and bunion. She states the pain from the bunion has worsened in the past 5 months. She is requesting an injection and wants to discuss possibly having surgery. She also has a new complaint of pain to the plantar aspect of the right forefoot that has been present for the past 3 months.   Objective/Physical Exam General: The patient is alert and oriented x3 in no acute distress.  Dermatology: Skin is warm, dry and supple bilateral lower extremities. Negative for open lesions or macerations.  Vascular: Palpable pedal pulses bilaterally. No edema or erythema noted. Capillary refill within normal limits.  Neurological: Epicritic and protective threshold grossly intact bilaterally.   Musculoskeletal Exam: Clinical evidence of symptomatic hallux abductovalgus with bunion deformity noted bilaterally, greater on the right foot. Pain on palpation and range of motion noted to the first MPJ right foot. Hammertoe contracture deformity noted fifth digits bilaterally as well as the second digit to the right foot  Radiographic Exam: Hammertoe contracture deformity noted to the interphalangeal joints and MPJ of the respective hammertoe digits mentioned on clinical musculoskeletal exam.     Assessment: #1 hallux abductovalgus with bunion deformity right foot #2 hammertoe second digit of right foot #3 Hammertoe fifth digit bilaterally  Plan of Care:  #1 Patient was evaluated. X-rays reviewed. #2 patient is controlled diabetic. Last hemoglobin A1c was 6.7 mg/dL. #3 Today we discussed the conservative versus surgical management of the presenting pathology. The patient opts for surgical management. All possible complications and details of the procedure were explained. All patient questions were answered. No guarantees were expressed or implied. #4 Authorization for surgery was initiated  today. Surgery will consist of Right bunionectomy, hammertoe repair second digit of the right foot, PIPJ derotational arthroplasty bilateral 5th digits. #5 return to clinic 1 week postop.     Dr. Edrick Kins, [place]

## 2017-02-11 LAB — HM DIABETES EYE EXAM

## 2017-02-17 ENCOUNTER — Other Ambulatory Visit: Payer: Self-pay | Admitting: Family Medicine

## 2017-02-17 DIAGNOSIS — Z1231 Encounter for screening mammogram for malignant neoplasm of breast: Secondary | ICD-10-CM

## 2017-02-27 ENCOUNTER — Telehealth: Payer: Self-pay | Admitting: Family Medicine

## 2017-02-27 DIAGNOSIS — Z794 Long term (current) use of insulin: Secondary | ICD-10-CM

## 2017-02-27 DIAGNOSIS — E1100 Type 2 diabetes mellitus with hyperosmolarity without nonketotic hyperglycemic-hyperosmolar coma (NKHHC): Secondary | ICD-10-CM

## 2017-02-27 NOTE — Telephone Encounter (Signed)
Needs refill on her insulin. She called the pharmacy but was told she could not get it until July 18.  Her last dose was last night.  Please advise. Pharmacy is Paediatric nurse at [facility]

## 2017-02-27 NOTE — Telephone Encounter (Signed)
Spoke with pharmacy and they state that patient will be able to pick up her lantus on Sunday and each bottle she is being dispensed is enough to last 33 days at the patient's current dose.  She already used up her emergency fill last month when she ran out early but the insurance only allows this once a year per the pharmacy.  Patient is aware of this and states that she is using her lantus as prescribed but is still running out.  States that her PCP in [place] would just write her for 2 bottles to be dispensed at a time.  Will forward to MD to write a new script to reflect this and patient is aware that she still cannot pick this up until Sunday. Amel Kitch,CMA

## 2017-03-05 HISTORY — PX: FOOT SURGERY: SHX648

## 2017-03-06 ENCOUNTER — Encounter: Payer: Self-pay | Admitting: Podiatry

## 2017-03-06 DIAGNOSIS — M2041 Other hammer toe(s) (acquired), right foot: Secondary | ICD-10-CM | POA: Diagnosis not present

## 2017-03-06 DIAGNOSIS — M7752 Other enthesopathy of left foot: Secondary | ICD-10-CM | POA: Diagnosis not present

## 2017-03-06 DIAGNOSIS — M2011 Hallux valgus (acquired), right foot: Secondary | ICD-10-CM | POA: Diagnosis not present

## 2017-03-06 DIAGNOSIS — M7751 Other enthesopathy of right foot: Secondary | ICD-10-CM | POA: Diagnosis not present

## 2017-03-07 ENCOUNTER — Telehealth: Payer: Self-pay | Admitting: *Deleted

## 2017-03-07 NOTE — Telephone Encounter (Addendum)
Pt states she is starting to get the feeling back in her feet and one has throbbing burning. I instructed pt to sit down, remove the surgical boot, open-ended sock, ace wrap only and elevate the foot for 15 minutes, and if the pain worsened than dangle the foot 15 minutes this being the only time it was okay to dangle the foot, after 15 minutes place foot at hip level and rewrap the ace looser from the toes toward the leg, replace the sock, and surgical boot. I told the pt the ace was placed on snuggly after the surgery to staunch any post-op bleeding and hold out the edema, but after it has served it's purpose some time it was too tight. Pt states she will just leave it alone, and ice and elevate. I told pt to call with concerns.03/12/2017-Pt states the pharmacy will not fill the dilaudid rx as written. I spoke to [facility] [phone] and she states [facility] will not refill for more than 5 days of a narcotic and not for more than 59meq equivalent to [place], pt can get #15 and take 3 per day and will have to get new prescription in 5 days. I asked pt if this is what she would like to do and she stated yes. Orders verbally given to [facility] for dilaudid 4mg  #15 one tablet every 8 hours.

## 2017-03-10 ENCOUNTER — Ambulatory Visit
Admission: RE | Admit: 2017-03-10 | Discharge: 2017-03-10 | Disposition: A | Discharge status: Home / Self Care | Payer: Medicaid Other | Source: Ambulatory Visit | Attending: Family Medicine | Admitting: Family Medicine

## 2017-03-10 ENCOUNTER — Ambulatory Visit: Payer: Medicaid Other

## 2017-03-10 DIAGNOSIS — Z1231 Encounter for screening mammogram for malignant neoplasm of breast: Secondary | ICD-10-CM

## 2017-03-12 ENCOUNTER — Ambulatory Visit (INDEPENDENT_AMBULATORY_CARE_PROVIDER_SITE_OTHER): Payer: Medicaid Other

## 2017-03-12 ENCOUNTER — Ambulatory Visit (INDEPENDENT_AMBULATORY_CARE_PROVIDER_SITE_OTHER): Payer: Self-pay | Admitting: Podiatry

## 2017-03-12 ENCOUNTER — Telehealth: Payer: Self-pay | Admitting: Podiatry

## 2017-03-12 ENCOUNTER — Encounter: Payer: Self-pay | Admitting: Podiatry

## 2017-03-12 ENCOUNTER — Telehealth: Payer: Self-pay | Admitting: *Deleted

## 2017-03-12 DIAGNOSIS — M21619 Bunion of unspecified foot: Secondary | ICD-10-CM

## 2017-03-12 DIAGNOSIS — M2041 Other hammer toe(s) (acquired), right foot: Secondary | ICD-10-CM | POA: Diagnosis not present

## 2017-03-12 DIAGNOSIS — M2011 Hallux valgus (acquired), right foot: Secondary | ICD-10-CM | POA: Diagnosis not present

## 2017-03-12 DIAGNOSIS — M2042 Other hammer toe(s) (acquired), left foot: Secondary | ICD-10-CM

## 2017-03-12 DIAGNOSIS — Z9889 Other specified postprocedural states: Secondary | ICD-10-CM

## 2017-03-12 MED ORDER — HYDROMORPHONE HCL 4 MG PO TABS: 4.0000 mg | ORAL_TABLET | ORAL | 0 refills | Status: DC | PRN

## 2017-03-12 NOTE — Telephone Encounter (Signed)
[place] [facility] called with questions about Rx written today.

## 2017-03-12 NOTE — Progress Notes (Signed)
   Subjective:  Patient presents today status post right foot bunionectomy with second digit hammertoe correction as well as bilateral fifth toe hammertoe corrections. Date of surgery 03/06/2017. Patient states that she's doing well however she does have some pain at nighttime. Patient is requesting a stronger pain medication. Patient otherwise has no complaints.    Objective/Physical Exam Skin incisions appear to be well coapted with sutures and staples intact. No sign of infectious process noted. No dehiscence. No active bleeding noted. Moderate edema noted to the surgical extremity.  Radiographic Exam:  Orthopedic hardware and osteotomies sites appear to be stable with routine healing.  Assessment: 1. s/p bunionectomy with second digit hammertoe repair right foot. Bilateral fifth toe arthroplasties. Date of surgery 03/06/2017   Plan of Care:  1. Patient was evaluated. X-rays reviewed 2. Today dressings were changed. Patient is otherwise doing well. 3. Continued immobilization cam boot to the right lower extremity and postoperative shoe to the left lower extremity. Weightbearing as tolerated 4. Prescription for Dilaudid 4mg  #30 5. Return to clinic in 1 week at which time we will dispense a postoperative shoe for the right lower extremity   Edrick Kins, DPM [facility]  Dr. Edrick Kins, [place]                                        [employer] [place], [place] [address]                Office [phone]  Fax [phone]

## 2017-03-13 LAB — HM DIABETES EYE EXAM

## 2017-03-17 ENCOUNTER — Telehealth: Payer: Self-pay | Admitting: *Deleted

## 2017-03-17 NOTE — Telephone Encounter (Signed)
Prior authorization form for Byetta placed in MD box along with preferred drug list. Please return to [place].

## 2017-03-19 ENCOUNTER — Ambulatory Visit (INDEPENDENT_AMBULATORY_CARE_PROVIDER_SITE_OTHER): Payer: Self-pay | Admitting: Podiatry

## 2017-03-19 DIAGNOSIS — Z9889 Other specified postprocedural states: Secondary | ICD-10-CM

## 2017-03-19 MED ORDER — HYDROMORPHONE HCL 4 MG PO TABS: 4.0000 mg | ORAL_TABLET | ORAL | 0 refills | Status: DC | PRN

## 2017-03-19 NOTE — Telephone Encounter (Signed)
PA for Byetta 5 mcg was approved via [employer] until 03/18/2018. Approval number: [id number]. Derl Barrow, RN

## 2017-03-21 NOTE — Progress Notes (Signed)
   Subjective:  Patient presents today status post right foot bunionectomy with second digit hammertoe correction as well as bilateral fifth toe hammertoe corrections. Date of surgery 03/06/2017.  And she has noticed a small increase in swelling to the right foot. Patient states that the pain is is moderate and slowly improving.    Objective/Physical Exam Skin incisions appear to be well coapted with sutures and staples intact. No sign of infectious process noted. No dehiscence. No active bleeding noted. Moderate edema noted to the surgical extremity.  Assessment: 1. s/p bunionectomy with second digit hammertoe repair right foot. Bilateral fifth toe arthroplasties. Date of surgery 03/06/2017   Plan of Care:  1. Patient was evaluated.  2. Today dressings were changed. Patient is otherwise doing well. 3.  Today to postoperative shoes were dispensed for bilateral weightbearing. Patient states that this past week is been draining significantly and she got her 1 postoperative shoe very wet and begin smelling with odor and she is concerned about its cleanliness. 4.  Refill Prescription for Dilaudid 4mg  #30 5.   Today the staples and sutures were removed to the bilateral feet.  6. Return to clinic in 1 week   Edrick Kins, DPM [facility]  Dr. Edrick Kins, [address]                                        [place], [place] [address]                Office [phone]  Fax [phone]

## 2017-03-26 ENCOUNTER — Ambulatory Visit (INDEPENDENT_AMBULATORY_CARE_PROVIDER_SITE_OTHER): Payer: Medicaid Other | Admitting: Podiatry

## 2017-03-26 ENCOUNTER — Ambulatory Visit (INDEPENDENT_AMBULATORY_CARE_PROVIDER_SITE_OTHER): Payer: Medicaid Other

## 2017-03-26 DIAGNOSIS — Z9889 Other specified postprocedural states: Secondary | ICD-10-CM

## 2017-03-26 DIAGNOSIS — M2011 Hallux valgus (acquired), right foot: Secondary | ICD-10-CM

## 2017-03-26 MED ORDER — HYDROMORPHONE HCL 4 MG PO TABS: 4.0000 mg | ORAL_TABLET | ORAL | 0 refills | Status: DC | PRN

## 2017-03-26 MED ORDER — GENTAMICIN SULFATE 0.1 % EX CREA: 1.0000 "application " | TOPICAL_CREAM | Freq: Three times a day (TID) | CUTANEOUS | 0 refills | Status: DC

## 2017-04-01 NOTE — Progress Notes (Signed)
   Subjective:  Patient presents today status post right foot bunionectomy with second digit hammertoe correction as well as bilateral fifth toe hammertoe corrections. Date of surgery 03/06/2017. Patient is continuing to have a lot of pain relating to the right foot surgical site. She's concerned that there possibly some pus draining from the incision site of her right foot.   Objective/Physical Exam Skin incisions appear to be well coapted with sutures and staples intact. No sign of infectious process noted. The incision site is actually well coapted. There is no drainage of purulent drainage as the patient was concerned about. No dehiscence. No active bleeding noted. Moderate edema noted to the surgical extremity.  Assessment: 1. s/p bunionectomy with second digit hammertoe repair right foot. Bilateral fifth toe arthroplasties. Date of surgery 03/06/2017   Plan of Care:  1. Patient was evaluated.  2. Today dressings were changed. Patient is otherwise doing well. 3.  Continue wearing postoperative shoes bilateral 4. Refill prescription for Dilaudid 4 mg #30 5. Prescription for gentamicin cream to be applied daily to incision sites 6. Compression anklet dispensed 7. Return to clinic in 2 weeks  Edrick Kins, DPM [facility]  Dr. Edrick Kins, [address]                                        [place], [place] [address]                Office [phone]  Fax [phone]

## 2017-04-08 ENCOUNTER — Ambulatory Visit (INDEPENDENT_AMBULATORY_CARE_PROVIDER_SITE_OTHER): Payer: Medicaid Other | Admitting: Family Medicine

## 2017-04-08 ENCOUNTER — Encounter: Payer: Self-pay | Admitting: Family Medicine

## 2017-04-08 VITALS — BP 118/84 | HR 81 | Temp 97.7°F | Wt 163.0 lb

## 2017-04-08 DIAGNOSIS — E1100 Type 2 diabetes mellitus with hyperosmolarity without nonketotic hyperglycemic-hyperosmolar coma (NKHHC): Secondary | ICD-10-CM

## 2017-04-08 DIAGNOSIS — Z794 Long term (current) use of insulin: Secondary | ICD-10-CM | POA: Diagnosis not present

## 2017-04-08 LAB — POCT GLYCOSYLATED HEMOGLOBIN (HGB A1C): HEMOGLOBIN A1C: 7.9

## 2017-04-08 NOTE — Patient Instructions (Signed)
It was great seeing you today! We have addressed the following issues today  1. Your A1c is 7.9 up from 6.7. I want you to keep doing what you were doing before. Watch your diet, continue to exercise and take your medications as prescribed. 2. I will see in in about three months to for a follow up on your diabetes and do a Pap smear as well.  If we did any lab work today, and the results require attention, either me or my nurse will get in touch with you. If everything is normal, you will get a letter in mail and a message via . If you don't hear from [place] in two weeks, please give [place] a call. Otherwise, we look forward to seeing you again at your next visit. If you have any questions or concerns before then, please call the clinic at [phone].  Please bring all your medications to every doctors visit  Sign up for My Chart to have easy access to your labs results, and communication with your Primary care physician. Please ask Front Desk for some assistance.   Please check-out at the front desk before leaving the clinic.    Take Care,   Dr. Andy Gauss

## 2017-04-08 NOTE — Progress Notes (Signed)
   Subjective:    Patient ID: Kayla Gregory, female    DOB: Sep 15, 1960, 56 y.o.   MRN: [id number]   CC: T2DM  HPI: Patient is 56 yo female with a complex past medical history who present today to follow up on her diabetes. Patient reports that her BG readings have been elevated lately in the 300-400's range. Patient admits that she has been eating a lot of sweet, fast food and know that it is not good for her. She has gain some weight and feel different inside. Patient admit her last A1c was good (6.7) because she was eating better and was more active. Since having bilateral foot surgery she has been more sedentary. Patient denies any polyuria and polydipsia, vision changes, abdominal pain, nausea and vomiting.  Smoking status reviewed   ROS: all other systems were reviewed and are negative other than in the HPI   Past Medical History:  Diagnosis Date  . Diabetes mellitus without complication ([place]) 4503  . Hepatitis C, chronic ([place])   . Hyperlipidemia associated with type 2 diabetes mellitus ([place])   . Hypertension associated with diabetes ([place])   . Schizophrenia [facility])     Past Surgical History:  Procedure Laterality Date  . JOINT REPLACEMENT Left    Hip  . TONSILLECTOMY      Past medical history, surgical, family, and social history reviewed and updated in the EMR as appropriate.  Objective:  BP 118/84   Pulse 81   Temp 97.7 F (36.5 C) (Oral)   Wt 163 lb (73.9 kg)   BMI 30.80 kg/m   Vitals and nursing note reviewed  General: NAD, pleasant, able to participate in exam Cardiac: RRR, normal heart sounds, no murmurs. 2+ radial and PT pulses bilaterally Respiratory: CTAB, normal effort, No wheezes, rales or rhonchi Abdomen: soft, nontender, nondistended, no hepatic or splenomegaly, +BS Extremities: no edema or cyanosis. WWP. Skin: warm and dry, no rashes noted Neuro: alert and oriented x4, no focal deficits Psych: Normal affect and mood   Assessment & Plan:   #T2DM  follow up, previously well controlled Patient A1c this morning is 7.9 up from 6.7 on 01/28/2017. Increase in A1c was consistent with worsening diet in the past few weeks. Discuss with patient need to improve dietary choices and improve exercise. Patient with good understanding with plan. She will return to her previous dietary plan. --Will see patient in 3 months for next A1c  --Will also schedule pap smear at next visit. Patient is overdue.   Marjie Skiff, MD [place] PGY-2

## 2017-04-09 ENCOUNTER — Ambulatory Visit (INDEPENDENT_AMBULATORY_CARE_PROVIDER_SITE_OTHER): Payer: Medicaid Other

## 2017-04-09 ENCOUNTER — Ambulatory Visit (INDEPENDENT_AMBULATORY_CARE_PROVIDER_SITE_OTHER): Payer: Medicaid Other | Admitting: Podiatry

## 2017-04-09 DIAGNOSIS — M2041 Other hammer toe(s) (acquired), right foot: Secondary | ICD-10-CM

## 2017-04-09 DIAGNOSIS — Z9889 Other specified postprocedural states: Secondary | ICD-10-CM

## 2017-04-09 DIAGNOSIS — M2042 Other hammer toe(s) (acquired), left foot: Secondary | ICD-10-CM | POA: Diagnosis not present

## 2017-04-09 DIAGNOSIS — M2011 Hallux valgus (acquired), right foot: Secondary | ICD-10-CM

## 2017-04-09 NOTE — Progress Notes (Signed)
   Subjective:  Patient presents today status post right foot bunionectomy with second digit hammertoe correction as well as bilateral fifth toe hammertoe corrections. Date of surgery 03/06/2017. Patient states that she's doing great. She has no pain. She has some motion in her feet and she is very satisfied. She has no new complaints today.   Objective/Physical Exam Skin incisions appear to be well coapted. The incision site is actually well coapted.  No dehiscence. No active bleeding noted. Moderate edema noted to the surgical extremity. Radiographic exam demonstrates intact orthopedic hardware with stable osteotomy sites and routine healing. Stable fifth toe arthroplasties noted bilaterally.  Assessment: 1. s/p bunionectomy with second digit hammertoe repair right foot. Bilateral fifth toe arthroplasties. Date of surgery 03/06/2017   Plan of Care:  1. Patient was evaluated.  2. Today we're going to initiate physical therapy 3x/week x 4 weeks 3. Orders placed for physical therapy 4. Return to clinic in 4 weeks  Edrick Kins, DPM [facility]  Dr. Edrick Kins, [address]                                        [place], [place] [address]                Office [phone]  Fax [phone]

## 2017-04-10 LAB — HM DIABETES EYE EXAM

## 2017-04-10 NOTE — Telephone Encounter (Addendum)
-----   Message from Edrick Kins, DPM sent at 04/09/2017  8:28 AM EDT ----- Regarding: Physical therapy Please order physical therapy 3 times per week 4 weeks  Diagnosis: Status post bunion right foot. Hammertoe corrections bilateral. Date of surgery 03/06/2017  Thanks, Dr. Amalia Hailey. 04/10/2017-Faxed referral, LOV, and demographics to [facility].

## 2017-04-23 ENCOUNTER — Ambulatory Visit: Payer: Medicaid Other | Attending: Podiatry | Admitting: Physical Therapy

## 2017-04-23 ENCOUNTER — Encounter: Payer: Self-pay | Admitting: Physical Therapy

## 2017-04-23 DIAGNOSIS — R262 Difficulty in walking, not elsewhere classified: Secondary | ICD-10-CM | POA: Diagnosis present

## 2017-04-23 DIAGNOSIS — M25571 Pain in right ankle and joints of right foot: Secondary | ICD-10-CM | POA: Insufficient documentation

## 2017-04-23 DIAGNOSIS — R2241 Localized swelling, mass and lump, right lower limb: Secondary | ICD-10-CM

## 2017-04-23 NOTE — Therapy (Signed)
[address] [place], [place], [address] Phone: [phone]   Fax:  [phone]  Physical Therapy Evaluation  Patient Details  Name: AKACIA BOLTZ MRN: [id number] Date of Birth: 31-Mar-1961 Referring Provider: Amalia Hailey  Encounter Date: 04/23/2017      PT End of Session - 04/23/17 1111    Visit Number 1   Authorization Type Medicaid may limit visits, to one    PT Start Time 1040   PT Stop Time 1135   PT Time Calculation (min) 55 min   Activity Tolerance Patient tolerated treatment well   Behavior During Therapy [facility] for tasks assessed/performed      Past Medical History:  Diagnosis Date  . Diabetes mellitus without complication ([place]) 9937  . Hepatitis C, chronic ([place])   . Hyperlipidemia associated with type 2 diabetes mellitus ([place])   . Hypertension associated with diabetes ([place])   . Schizophrenia [facility])     Past Surgical History:  Procedure Laterality Date  . JOINT REPLACEMENT Left    Hip  . TONSILLECTOMY      There were no vitals filed for this visit.       Subjective Assessment - 04/23/17 1048    Subjective Patient reports that she had right bunionectomy and 3 hammer toe correction on 03/06/17.  She reports that she is having swelling and pain in the 2nd toe area.  She was in a cam boot for 2-3 weeks, she is now in a Procare walking splint shoe.     Patient Stated Goals have less pain, walk better   Currently in Pain? Yes   Pain Score 10-Worst pain ever   Pain Location Toe (Comment which one)  2nd   Pain Orientation Right   Pain Descriptors / Indicators Aching   Pain Type Acute pain;Surgical pain   Pain Onset More than a month ago   Pain Frequency Constant   Aggravating Factors  walking, standing pain a 10/10   Pain Relieving Factors rest, elevation, pain meds pain can be a 4/10   Effect of Pain on Daily Activities Hurts and I can't do much            [facility] PT Assessment - 04/23/17  0001      Assessment   Medical Diagnosis s/p left bunionectomy and hammer toe correction surgery   Referring Provider [place]   Onset Date/Surgical Date 03/06/17   Prior Therapy no     Precautions   Precautions None     Balance Screen   Has the patient fallen in the past 6 months No   Has the patient had a decrease in activity level because of a fear of falling?  No   Is the patient reluctant to leave their home because of a fear of falling?  No     Home Environment   Additional Comments housework, no stairs     Prior Function   Level of Independence Independent   Vocation On disability   Vocation Requirements left hip replacement   Leisure no exercise     Observation/Other Assessments-Edema    Edema --  swelling of the mid foot and ankle, 3+ pitting     ROM / Strength   AROM / PROM / Strength AROM;PROM;Strength     AROM   Overall AROM Comments stiff and tight with a little pain   AROM Assessment Site Ankle   Right/Left Ankle Right   Right Ankle Dorsiflexion 12   Right  Ankle Plantar Flexion 26   Right Ankle Inversion 15   Right Ankle Eversion 5     PROM   Overall PROM Comments PROM of the toes are WNL's except for the 2nd to was to 15 degrees with pain     Strength   Overall Strength Comments 4-/5 in the avaliable ROM for hte right ankle with c/o pain in the 2nd     Palpation   Palpation comment significant edema in the mid foot, pitting, 2nd toe is very swollen, she has tenderness around the 2nd toe and mild tenderness in the great toe     Ambulation/Gait   Gait Comments gait is with a walking Procare shoe with stiff bottom.  She is antalgic on the right            Objective measurements completed on examination: See above findings.          [facility] Adult PT Treatment/Exercise - 04/23/17 0001      Modalities   Modalities Vasopneumatic     Vasopneumatic   Number Minutes Vasopneumatic  15 minutes   Vasopnuematic Location  Ankle   Vasopneumatic  Pressure Medium   Vasopneumatic Temperature  34                PT Education - 04/23/17 1111    Education provided Yes   Education Details instructions in RICE, and gentle AROM of the ankle   Person(s) Educated Patient   Methods Explanation;Demonstration;Handout   Comprehension Verbalized understanding             PT Long Term Goals - 04/23/17 1121      PT LONG TERM GOAL #1   Title understand and perform RICE   Time 6   Period Weeks   Status New     PT LONG TERM GOAL #2   Title decrease pain 50%   Time 6   Period Weeks   Status New     PT LONG TERM GOAL #3   Title decresae pain 50%   Time 6   Period Weeks   Status New     PT LONG TERM GOAL #4   Title indepednent with HEP   Time 6   Period Weeks   Status New                Plan - 04/23/17 1112    Clinical Impression Statement Patient underwent a bunionectomy and 2nd and 5th hammer toe correction surgery on 03/06/17.  She has significant pitting edema in the mid foot and the 2nd toe.  She walsk wiht a protective shoe.  Has some limitations in her ROM, pain is mostly in the 2nd toe with any pressure or motions.   Clinical Presentation Evolving   Clinical Presentation due to: significant swelling, DM and three different surgeries on the foot   Clinical Decision Making Moderate   Rehab Potential Good   PT Frequency 1x / week   PT Duration 6 weeks   PT Treatment/Interventions Electrical Stimulation;Cryotherapy;Gait training;Functional mobility training;Patient/family education;Neuromuscular re-education;Balance training;Therapeutic exercise;Therapeutic activities;Manual techniques;Vasopneumatic Device   PT Next Visit Plan I will have to see if Medicaid approves any additional visits, they may since she had surgery, if they do will need to assure that she is doing RICE to help with edema   Consulted and Agree with Plan of Care Patient      Patient will benefit from skilled therapeutic intervention  in order to improve the following deficits and impairments:  Abnormal  gait, Decreased balance, Decreased mobility, Decreased strength, Increased edema, Pain, Difficulty walking, Decreased range of motion  Visit Diagnosis: Acute right ankle pain - Plan: PT plan of care cert/re-cert  Localized swelling, mass and lump, right lower limb - Plan: PT plan of care cert/re-cert  Difficulty in walking, not elsewhere classified - Plan: PT plan of care cert/re-cert     Problem List Patient Active Problem List   Diagnosis Date Noted  . Chronic left shoulder pain 12/27/2016  . Acute pain of right shoulder 11/20/2016  . Vomiting 10/09/2016  . Cough 10/09/2016  . Chest pain 10/07/2016  . Trigger finger 10/07/2016  . Great toe pain 05/10/2016  . Right ear pain 03/31/2016  . Partial thickness burn of ankle 02/02/2016  . Healthcare maintenance 12/28/2015  . Stable angina ([place]) 12/22/2015  . Type 2 diabetes mellitus ([place]) 12/22/2015  . Hypertension associated with diabetes ([place]) 12/22/2015  . GERD (gastroesophageal reflux disease) 12/22/2015  . Hepatitis, chronic ([place]) 12/22/2015    Sumner Boast., PT 04/23/2017, 11:24 AM  [place] [address] [place], [place], [address] Phone: [phone]   Fax:  [phone]  Name: NOLIA TSCHANTZ MRN: [id number] Date of Birth: 05-14-1961

## 2017-04-24 ENCOUNTER — Other Ambulatory Visit: Payer: Self-pay | Admitting: Family Medicine

## 2017-04-24 ENCOUNTER — Telehealth: Payer: Self-pay | Admitting: Family Medicine

## 2017-04-24 DIAGNOSIS — E1100 Type 2 diabetes mellitus with hyperosmolarity without nonketotic hyperglycemic-hyperosmolar coma (NKHHC): Secondary | ICD-10-CM

## 2017-04-24 DIAGNOSIS — Z794 Long term (current) use of insulin: Secondary | ICD-10-CM

## 2017-04-24 MED ORDER — INSULIN GLARGINE 100 UNIT/ML SOLOSTAR PEN: 30.0000 [IU] | PEN_INJECTOR | Freq: Every day | SUBCUTANEOUS | 2 refills | Status: DC

## 2017-04-24 NOTE — Telephone Encounter (Signed)
Pt is calling to get refill on her insulin. She needs this called in at 89 day qty for insurance purposes. She would also like to speak to Dr. Andy Gauss. jw

## 2017-04-24 NOTE — Telephone Encounter (Signed)
Talked to patient about her Lantus. Will reorder it for 90 day supply.  Thanks  Marjie Skiff, MD [place], PGY-2

## 2017-04-29 LAB — HM DIABETES EYE EXAM

## 2017-05-07 ENCOUNTER — Ambulatory Visit (INDEPENDENT_AMBULATORY_CARE_PROVIDER_SITE_OTHER): Payer: Medicaid Other

## 2017-05-07 ENCOUNTER — Encounter: Payer: Self-pay | Admitting: Podiatry

## 2017-05-07 ENCOUNTER — Ambulatory Visit: Payer: Medicaid Other

## 2017-05-07 ENCOUNTER — Ambulatory Visit (INDEPENDENT_AMBULATORY_CARE_PROVIDER_SITE_OTHER): Payer: Self-pay | Admitting: Podiatry

## 2017-05-07 DIAGNOSIS — M2042 Other hammer toe(s) (acquired), left foot: Secondary | ICD-10-CM | POA: Diagnosis not present

## 2017-05-07 DIAGNOSIS — M2041 Other hammer toe(s) (acquired), right foot: Secondary | ICD-10-CM

## 2017-05-07 DIAGNOSIS — M2011 Hallux valgus (acquired), right foot: Secondary | ICD-10-CM | POA: Diagnosis not present

## 2017-05-07 DIAGNOSIS — Z9889 Other specified postprocedural states: Secondary | ICD-10-CM

## 2017-05-11 NOTE — Progress Notes (Signed)
   Subjective:  Patient presents today status post right foot bunionectomy with second digit hammertoe correction as well as bilateral fifth toe hammertoe corrections. Date of surgery 03/06/2017. She states she is still experiencing some pain in the right great toe and right second toe. She reports associated swelling of the right foot. Pt attributes these symptoms to the weather. She denies any complaints of the left foot and states it is doing well. She is here for further evaluation and treatment.    Past Medical History:  Diagnosis Date  . Diabetes mellitus without complication ([place]) 6629  . Hepatitis C, chronic ([place])   . Hyperlipidemia associated with type 2 diabetes mellitus ([place])   . Hypertension associated with diabetes ([place])   . Schizophrenia ([place])       Objective/Physical Exam Skin incisions appear to be well coapted. No dehiscence. No active bleeding noted. Moderate edema noted to the surgical extremity.  Radiographic exam:  Intact orthopedic hardware with stable osteotomy sites and routine healing. Stable fifth toe arthroplasties noted bilaterally. Moderate degenerative changes to the 1st metatarsal osteotomy site of the right foot.   Assessment: 1. s/p bunionectomy with second digit hammertoe repair right foot. Bilateral fifth toe arthroplasties. Date of surgery 03/06/2017   Plan of Care:  1. Patient was evaluated. X-Rays reviewed. 2. Return to normal shoe gear. 3. Slowly increase activity. 4. Return to clinic when necessary.    Edrick Kins, DPM [facility]  Dr. Edrick Kins, [address]                                        [place], [place] [address]                Office [phone]  Fax [phone]

## 2017-05-14 LAB — HM DIABETES EYE EXAM

## 2017-05-19 ENCOUNTER — Ambulatory Visit: Payer: Medicaid Other | Attending: Podiatry | Admitting: Physical Therapy

## 2017-05-20 ENCOUNTER — Other Ambulatory Visit: Payer: Self-pay | Admitting: Internal Medicine

## 2017-05-20 NOTE — Telephone Encounter (Signed)
Pt wants to talk to dr Greggory Keen.  She wants her losartan refilled and doesn't know why another drs name is on the prescription.

## 2017-05-20 NOTE — Telephone Encounter (Signed)
Spoke with patient and explained to her that Dr. Andy Gauss wasn't able to fill her script when she requested it in august but that I would send it to him now to refill.  Patient voiced understanding. [name] Hartsell,CMA

## 2017-05-26 NOTE — Progress Notes (Signed)
DOS 03/06/17 Bunionectomy w 1st met osteotomy Rt, hammertoe repair w capsulotomy 2nd Rt, derotational arthroplasty 5th digit bilateral

## 2017-05-27 LAB — HM DIABETES EYE EXAM

## 2017-06-11 LAB — HM DIABETES EYE EXAM

## 2017-06-24 LAB — HM DIABETES EYE EXAM

## 2017-07-07 ENCOUNTER — Other Ambulatory Visit: Payer: Self-pay

## 2017-07-07 ENCOUNTER — Encounter: Payer: Self-pay | Admitting: Family Medicine

## 2017-07-07 ENCOUNTER — Ambulatory Visit (INDEPENDENT_AMBULATORY_CARE_PROVIDER_SITE_OTHER): Payer: Medicaid Other | Admitting: Family Medicine

## 2017-07-07 ENCOUNTER — Other Ambulatory Visit (HOSPITAL_COMMUNITY)
Admission: RE | Admit: 2017-07-07 | Discharge: 2017-07-07 | Disposition: A | Discharge status: Home / Self Care | Payer: Medicaid Other | Source: Ambulatory Visit | Attending: Family Medicine | Admitting: Family Medicine

## 2017-07-07 VITALS — BP 118/64 | HR 74 | Temp 98.2°F | Ht 61.0 in | Wt 164.0 lb

## 2017-07-07 DIAGNOSIS — Z794 Long term (current) use of insulin: Secondary | ICD-10-CM | POA: Diagnosis not present

## 2017-07-07 DIAGNOSIS — Z124 Encounter for screening for malignant neoplasm of cervix: Secondary | ICD-10-CM

## 2017-07-07 DIAGNOSIS — Z113 Encounter for screening for infections with a predominantly sexual mode of transmission: Secondary | ICD-10-CM

## 2017-07-07 DIAGNOSIS — Z1211 Encounter for screening for malignant neoplasm of colon: Secondary | ICD-10-CM

## 2017-07-07 DIAGNOSIS — Z1159 Encounter for screening for other viral diseases: Secondary | ICD-10-CM | POA: Diagnosis not present

## 2017-07-07 DIAGNOSIS — E1100 Type 2 diabetes mellitus with hyperosmolarity without nonketotic hyperglycemic-hyperosmolar coma (NKHHC): Secondary | ICD-10-CM | POA: Diagnosis not present

## 2017-07-07 LAB — POCT GLYCOSYLATED HEMOGLOBIN (HGB A1C): HEMOGLOBIN A1C: 5.6

## 2017-07-07 NOTE — Patient Instructions (Signed)
It was great seeing you today! We have addressed the following issues today  1. Your A1c is 5.6 down from 7.9. You have done an excellent job, keep up the good work. 2. I will check your syphilis, HIV, GC/Chlamydia, trichomoniasis. 3. Will give a stool card to check for any sign of bleeding.    If we did any lab work today, and the results require attention, either me or my nurse will get in touch with you. If everything is normal, you will get a letter in mail and a message via . If you don't hear from [place] in two weeks, please give [place] a call. Otherwise, we look forward to seeing you again at your next visit. If you have any questions or concerns before then, please call the clinic at [phone].  Please bring all your medications to every doctors visit  Sign up for My Chart to have easy access to your labs results, and communication with your Primary care physician. Please ask Front Desk for some assistance.   Please check-out at the front desk before leaving the clinic.    Take Care,   Dr. Andy Gauss

## 2017-07-07 NOTE — Progress Notes (Signed)
   Subjective:    Patient ID: Kayla Gregory, female    DOB: [birth year], 56 y.o.   MRN: [id number]   CC: Follow-up for type 2 diabetes management  HPI: Patient is a 56 year old female who presents today to follow-up on type 2 diabetes management.  Patient is A1c was 7.9.  Patient endorses dietary changes since last office visit.  She is currently on Lantus Lantus 10 units daily and By[place] 5 units 2 times a day.  Patient denies any polyuria and polydipsia.  Patient is confident that her A1c will be much improved from last office visit.  Patient also requested STD check and Pap smear at today's visit.  Smoking status reviewed   ROS: all other systems were reviewed and are negative other than in the HPI   Past Medical History:  Diagnosis Date  . Diabetes mellitus without complication ([place]) 6286  . Hepatitis C, chronic ([place])   . Hyperlipidemia associated with type 2 diabetes mellitus ([place])   . Hypertension associated with diabetes ([place])   . Schizophrenia [facility])     Past Surgical History:  Procedure Laterality Date  . JOINT REPLACEMENT Left    Hip  . TONSILLECTOMY      Past medical history, surgical, family, and social history reviewed and updated in the EMR as appropriate.  Objective:  BP 118/64   Pulse 74   Temp 98.2 F (36.8 C) (Oral)   Ht 5\' 1"  (1.549 m)   Wt 164 lb (74.4 kg)   SpO2 94%   BMI 30.99 kg/m   Vitals and nursing note reviewed  General: NAD, pleasant, able to participate in exam Cardiac: RRR, normal heart sounds, no murmurs. 2+ radial and PT pulses bilaterally Respiratory: CTAB, normal effort, No wheezes, rales or rhonchi Abdomen: soft, nontender, nondistended, no hepatic or splenomegaly, +BS Female genitalia: not done normal external genitalia, vulva, vagina, cervix, uterus and adnexa Cervix: not indicated, no discharge noted, cervical motion tenderness absent and lesions absent Extremities: no edema or cyanosis. WWP. Skin: warm and dry, no rashes  noted Neuro: alert and oriented x4, no focal deficits Psych: Normal affect and mood   Assessment & Plan:    #Type 2 diabetes management follow-up A1c today is 5.6 down from 7.9 on 04/08/2017.  Improvement showed adherence to current therapy as well as sustained TLC.  Patient is content with the results and would like to continue on current regimen.  Will recheck A1c in 3 months and consider therapy changes if still below 5.7.  Encourage patient to continue with current therapeutic lifestyle changes.  Patient recently had an ophthalmology exam which shows improvement in diabetic retinopathy.  #STD check Patient reports having protected intercourse with new partner to have a complete check. --We will order HIV, GC, chlamydia, trichomonas, RPR  Pap smear performed today during exam will follow up on results. Patient was also given FOBT card.   Marjie Skiff, MD [place] PGY-2

## 2017-07-08 LAB — CYTOLOGY - PAP
CHLAMYDIA, DNA PROBE: NEGATIVE
DIAGNOSIS: NEGATIVE
NEISSERIA GONORRHEA: NEGATIVE
Trichomonas: NEGATIVE

## 2017-07-08 LAB — HEPATITIS C ANTIBODY: Hep C Virus Ab: 7 s/co ratio — ABNORMAL HIGH (ref 0.0–0.9)

## 2017-07-08 LAB — HIV ANTIBODY (ROUTINE TESTING W REFLEX): HIV Screen 4th Generation wRfx: NONREACTIVE

## 2017-07-08 LAB — RPR: RPR: NONREACTIVE

## 2017-07-09 LAB — HM DIABETES EYE EXAM

## 2017-07-17 ENCOUNTER — Other Ambulatory Visit: Payer: Self-pay | Admitting: Family Medicine

## 2017-07-17 ENCOUNTER — Telehealth: Payer: Self-pay | Admitting: Family Medicine

## 2017-07-17 DIAGNOSIS — Z794 Long term (current) use of insulin: Secondary | ICD-10-CM

## 2017-07-17 DIAGNOSIS — E1100 Type 2 diabetes mellitus with hyperosmolarity without nonketotic hyperglycemic-hyperosmolar coma (NKHHC): Secondary | ICD-10-CM

## 2017-07-17 MED ORDER — INSULIN GLARGINE 100 UNIT/ML SOLOSTAR PEN: 30.0000 [IU] | PEN_INJECTOR | Freq: Every day | SUBCUTANEOUS | 2 refills | Status: DC

## 2017-07-17 MED ORDER — LOSARTAN POTASSIUM-HCTZ 100-25 MG PO TABS: 1.0000 | ORAL_TABLET | Freq: Every day | ORAL | 1 refills | Status: DC

## 2017-07-17 MED ORDER — ACCU-CHEK AVIVA PLUS W/DEVICE KIT: 1.0000 "application " | PACK | Freq: Three times a day (TID) | 0 refills | Status: DC

## 2017-07-17 MED ORDER — AMLODIPINE BESYLATE 10 MG PO TABS: 10.0000 mg | ORAL_TABLET | Freq: Every day | ORAL | 11 refills | Status: DC

## 2017-07-17 NOTE — Telephone Encounter (Signed)
Spoke with patient and asked her to please contact her pharmacy to get these refills sent electronically.  Patient states that she lost her meter and would like a new one sent to the pharmacy.  [name] Hartsell,CMA

## 2017-07-17 NOTE — Telephone Encounter (Signed)
Pt called and needs refills on all meds sent to pharmacy and an accu-chek. Please advise

## 2017-07-21 ENCOUNTER — Other Ambulatory Visit: Payer: Self-pay | Admitting: Family Medicine

## 2017-07-21 DIAGNOSIS — Z794 Long term (current) use of insulin: Secondary | ICD-10-CM

## 2017-07-21 DIAGNOSIS — E1100 Type 2 diabetes mellitus with hyperosmolarity without nonketotic hyperglycemic-hyperosmolar coma (NKHHC): Secondary | ICD-10-CM

## 2017-07-21 MED ORDER — INSULIN GLARGINE 100 UNIT/ML SOLOSTAR PEN: 30.0000 [IU] | PEN_INJECTOR | Freq: Every day | SUBCUTANEOUS | 2 refills | Status: DC

## 2017-07-21 NOTE — Telephone Encounter (Signed)
Spoke with patient and she states that she only received 3 pens of her lantus when she went to the pharmacy and this won't be enough.  Patient said that she normally would get 5 pens and this would last her all the way through to the next refill.  Will forward to MD to make sure script reflects enough medication and I will call pharmacy after that to see what they can tell me. [name] Hartsell,CMA

## 2017-07-21 NOTE — Telephone Encounter (Signed)
Pt would like to talk to dr diallo/. She said it was a pesonal issue

## 2017-07-21 NOTE — Telephone Encounter (Signed)
Send an additional two pens for her. Please let her know.  Thank you  Marjie Skiff, MD [place], PGY-2

## 2017-07-22 NOTE — Telephone Encounter (Signed)
Patient informed about medication.  She also wanted to know about her lab results.  I informed her that everything came back normal except her Hep C screening which patient is aware of since she had it in the past.  She states that she has been treated before for this and wants to make sure there isn't anything she needs to do about it now.  Lasharon Dunivan,CMA

## 2017-08-13 ENCOUNTER — Other Ambulatory Visit: Payer: Self-pay | Admitting: Family Medicine

## 2017-08-13 NOTE — Telephone Encounter (Signed)
Sugar yesterday 229, 199, 247, 304. Sugar today 178 and keeps getting higher. Pt only has 30 units left of lantus and she hasnt been taking it because it has to last her until 08/19/17. Pt needs more because she's been feeling bad without taking it and the above numbers is what her sugar has been running. Please advise

## 2017-08-14 NOTE — Telephone Encounter (Signed)
Dr. Andy Gauss spoke with patient and offered her a sample of lantus to get her through til she can fill her script again.  Sample set aside in refrigerator for patient.  [place] [id number], lot [id number], exp 01/17/2020. Yaacov Koziol,CMA

## 2017-08-22 ENCOUNTER — Telehealth: Payer: Self-pay | Admitting: Family Medicine

## 2017-08-22 NOTE — Telephone Encounter (Signed)
Spoke with patient and she states that she still was only able to pick up 3 pens of lantus vs the 5 pens she is prescribed.  I spoke with the pharmacy and they stated that patient will need to call Medicaid and inquire why they only cover this much. I informed patient of this and she will try and call them on Monday.  [name] Hartsell,CMA

## 2017-08-22 NOTE — Telephone Encounter (Signed)
Pt wants to talk to  Dr Andy Gauss about her medicine.

## 2017-09-11 ENCOUNTER — Telehealth: Payer: Self-pay | Admitting: *Deleted

## 2017-09-11 NOTE — Telephone Encounter (Signed)
Patient called this morning asking for samples of lantus as she will run out before her next fill is available.  Ok per Dr. Andy Gauss and Dr. Valentina Lucks to give patient 2 lantus pens.  She is aware that they are set aside for her and will come by this afternoon to pick them up.  [place] [id number], LOT # [id number], EXP 01/17/2020. Jarrick Fjeld,CMA

## 2017-09-18 ENCOUNTER — Encounter: Payer: Self-pay | Admitting: Family Medicine

## 2017-09-25 ENCOUNTER — Other Ambulatory Visit: Payer: Self-pay

## 2017-09-25 ENCOUNTER — Ambulatory Visit (INDEPENDENT_AMBULATORY_CARE_PROVIDER_SITE_OTHER): Payer: Medicaid Other | Admitting: Family Medicine

## 2017-09-25 ENCOUNTER — Encounter: Payer: Self-pay | Admitting: Family Medicine

## 2017-09-25 VITALS — BP 140/70 | HR 76 | Temp 98.3°F | Wt 158.0 lb

## 2017-09-25 DIAGNOSIS — K739 Chronic hepatitis, unspecified: Secondary | ICD-10-CM

## 2017-09-25 DIAGNOSIS — E785 Hyperlipidemia, unspecified: Secondary | ICD-10-CM | POA: Diagnosis not present

## 2017-09-25 DIAGNOSIS — Z23 Encounter for immunization: Secondary | ICD-10-CM

## 2017-09-25 DIAGNOSIS — E1169 Type 2 diabetes mellitus with other specified complication: Secondary | ICD-10-CM | POA: Diagnosis not present

## 2017-09-25 DIAGNOSIS — Z794 Long term (current) use of insulin: Secondary | ICD-10-CM | POA: Diagnosis not present

## 2017-09-25 DIAGNOSIS — E1100 Type 2 diabetes mellitus with hyperosmolarity without nonketotic hyperglycemic-hyperosmolar coma (NKHHC): Secondary | ICD-10-CM

## 2017-09-25 LAB — POCT GLYCOSYLATED HEMOGLOBIN (HGB A1C): HEMOGLOBIN A1C: 7.1

## 2017-09-25 MED ORDER — INSULIN GLARGINE 100 UNIT/ML SOLOSTAR PEN: 30.0000 [IU] | PEN_INJECTOR | Freq: Every day | SUBCUTANEOUS | 2 refills | Status: DC

## 2017-09-25 NOTE — Patient Instructions (Signed)
It was great seeing you today! We have addressed the following issues today  1. Your A1c is 7.1 which is slightly higher than last time but still within a good range. Continue to work on diet and exercise and make sure you take your medications as needed. 2. I will follow up with you for the results of your Hep C test.  If we did any lab work today, and the results require attention, either me or my nurse will get in touch with you. If everything is normal, you will get a letter in mail and a message via . If you don't hear from [place] in two weeks, please give [place] a call. Otherwise, we look forward to seeing you again at your next visit. If you have any questions or concerns before then, please call the clinic at [phone].  Please bring all your medications to every doctors visit  Sign up for My Chart to have easy access to your labs results, and communication with your Primary care physician. Please ask Front Desk for some assistance.   Please check-out at the front desk before leaving the clinic.    Take Care,   Dr. Andy Gauss

## 2017-09-25 NOTE — Progress Notes (Signed)
   Subjective:    Patient ID: Kayla Gregory, female    DOB: November 25, 1960, 57 y.o.   MRN: [id number]   CC: Follow-up for type 2 diabetes  HPI: Patient is a 57 year old female who presents today to follow-up on type 2 diabetes management.  Patient diabetes has been well controlled in the past 3 months with last A1c of 5.6.  Patient reports adherence to current medication regimen.  Patient reports a few days of nonadherence due to lapse in prescription plan. She is currently taking Lantus and Byetta on a daily basis.  Patient has also made significant dietary changes has been trying to exercise more.  Patient denies any polyuria or polydipsia.  No acute complaints today.  Smoking status reviewed   ROS: all other systems were reviewed and are negative other than in the HPI   Past Medical History:  Diagnosis Date  . Diabetes mellitus without complication ([place]) 5726  . Hepatitis C, chronic ([place])   . Hyperlipidemia associated with type 2 diabetes mellitus ([place])   . Hypertension associated with diabetes ([place])   . Schizophrenia [facility])     Past Surgical History:  Procedure Laterality Date  . JOINT REPLACEMENT Left    Hip  . TONSILLECTOMY      Past medical history, surgical, family, and social history reviewed and updated in the EMR as appropriate.  Objective:  BP 140/70   Pulse 76   Temp 98.3 F (36.8 C) (Oral)   Wt 158 lb (71.7 kg)   SpO2 95%   BMI 29.85 kg/m   Vitals and nursing note reviewed  General: NAD, pleasant, able to participate in exam Cardiac: RRR, normal heart sounds, no murmurs. 2+ radial and PT pulses bilaterally Respiratory: CTAB, normal effort, No wheezes, rales or rhonchi Abdomen: soft, nontender, nondistended, no hepatic or splenomegaly, +BS Extremities: no edema or cyanosis. WWP. Skin: warm and dry, no rashes noted Neuro: alert and oriented x4, no focal deficits Psych: Normal affect and mood   Assessment & Plan:   #Type 2 diabetes, well controlled A1c  today is 7.1 up from 5.6.  Slight increase in A1c likely due to intermittent lack of adherence to regimen.  Still with very good control compared to previous A1c two years ago.  Encourage patient to continue with current diet and exercise plan.  Given good results obtained with current medication regimen we will not make any changes today. --Continue with Lantus and Byetta --Follow-up in 3 months for A1c recheck  #Hep C screening At last office visit patient had positive otitis C virus antibodies.  Patient has a history of hep C which was treated a few years ago.  Given test results will rule out active infection by ordering an HCV nucleic acid amplification test per CDC recommendation.  Last CMP was within normal limit with normal AST, ALT, alk phos. --We will follow-up on results   Marjie Skiff, MD [place] PGY-2

## 2017-09-27 LAB — HEPATITIS C VRS RNA DETECT BY PCR-QUAL: HCV RNA NAA QUALITATIVE: NEGATIVE

## 2017-09-29 ENCOUNTER — Telehealth: Payer: Self-pay | Admitting: *Deleted

## 2017-09-29 NOTE — Telephone Encounter (Signed)
Patient informed of results. Pankaj Haack,CMA  

## 2017-09-29 NOTE — Telephone Encounter (Signed)
-----   Message from Marjie Skiff, MD sent at 09/29/2017  2:32 PM EST ----- [name],   Would please let patient know that her Hep C test we did last time she was seen in the office are normal.   Thanks   Marjie Skiff, MD [place], PGY-2

## 2017-10-10 ENCOUNTER — Other Ambulatory Visit: Payer: Self-pay | Admitting: Family Medicine

## 2017-10-10 ENCOUNTER — Telehealth: Payer: Self-pay

## 2017-10-10 ENCOUNTER — Telehealth: Payer: Self-pay | Admitting: Family Medicine

## 2017-10-10 MED ORDER — ACCU-CHEK SOFTCLIX LANCET DEV KIT: PACK | 1 refills | Status: DC

## 2017-10-10 NOTE — Telephone Encounter (Signed)
Patient left message on nurse line that she needs a refill on her lancets. Danley Danker, RN [facility] RN)

## 2017-10-10 NOTE — Telephone Encounter (Signed)
Looks like we gave ger two pens at the end of January, and you refilled this medication at her pharmacy, no?

## 2017-10-10 NOTE — Telephone Encounter (Signed)
Pt has run out of her insulin. She says Dr Andy Gauss told her he would leave her samples up front.  She is using the bus so she needs to come up to get it as soon as possible. Please call her when it is ready for pickup

## 2017-10-10 NOTE — Telephone Encounter (Signed)
Page,  I do not know if we have sample for her, but I did not tell her that I would leave samples for her. She is supposed to call her insurance to find out why the pharmacy is only willing to give her 3 pens at a time when she needs 5. This is a problem on her end not ours. Make sure we have sample before she comes to clinic.  Thanks   Marjie Skiff, MD [place], PGY-2

## 2017-10-16 NOTE — Telephone Encounter (Signed)
Per [name], pt came into the office on Friday and was told we do not have samples. Pt was fine with this and will check with the pharmacy.

## 2017-11-07 ENCOUNTER — Telehealth: Payer: Self-pay | Admitting: Family Medicine

## 2017-11-07 NOTE — Telephone Encounter (Signed)
Spoke with Dr. Valentina Lucks and pharmacy resident.  Per number of units she is using daily, there is plenty of medication in 3 pens.  Patient made an appointment to see Dr. Valentina Lucks on 11-20-17 to discuss insulin and proper dosage.  [name] Hartsell,CMA

## 2017-11-07 NOTE — Telephone Encounter (Signed)
Thank you for taking care of that.  Marjie Skiff, MD [place], PGY-2

## 2017-11-07 NOTE — Telephone Encounter (Signed)
Spoke with patient and she is requesting samples of lantus since she only gets 3 paid for by her insurance but needs 5 pens to get through the month.  2 pens given NDC [id number], lot [id number],  Exp 04-18-20.  Will check with Dr. Valentina Lucks if there is anyway to get her insurance to cover the right amount of medication.  Genaro Bekker,CMA

## 2017-11-07 NOTE — Telephone Encounter (Signed)
Pt called and would like to speak to Dr. Andy Gauss about her medication jw

## 2017-11-11 ENCOUNTER — Telehealth: Payer: Self-pay | Admitting: Family Medicine

## 2017-11-11 ENCOUNTER — Other Ambulatory Visit: Payer: Self-pay | Admitting: Family Medicine

## 2017-11-11 DIAGNOSIS — F1911 Other psychoactive substance abuse, in remission: Secondary | ICD-10-CM

## 2017-11-11 DIAGNOSIS — T7840XS Allergy, unspecified, sequela: Secondary | ICD-10-CM

## 2017-11-11 NOTE — Telephone Encounter (Signed)
Will forward to MD to advise on gadolinium testing.  Anuhea Gassner,CMA

## 2017-11-11 NOTE — Telephone Encounter (Signed)
Call patient back, however voicemail is not set up yet. Will try again later.  Marjie Skiff, MD [place], PGY-2

## 2017-11-11 NOTE — Telephone Encounter (Signed)
Patient needs to know where to get a Gavolinium test done, or can we do it here?  Please call patient asap at [phone].

## 2017-11-17 NOTE — Telephone Encounter (Signed)
Kayla Gregory is not happy. She doesn't think the referral to allergy and asthma is the right place to be tested for gadolinium.  She was told [facility] tried to call her today but she said no one had called her.  Please call pt because she feels we "are messing around with her"

## 2017-11-19 ENCOUNTER — Encounter: Payer: Self-pay | Admitting: Family Medicine

## 2017-11-20 ENCOUNTER — Encounter: Payer: Self-pay | Admitting: Pharmacist

## 2017-11-20 ENCOUNTER — Ambulatory Visit (INDEPENDENT_AMBULATORY_CARE_PROVIDER_SITE_OTHER): Payer: Medicaid Other | Admitting: Pharmacist

## 2017-11-20 DIAGNOSIS — Z794 Long term (current) use of insulin: Secondary | ICD-10-CM | POA: Diagnosis not present

## 2017-11-20 DIAGNOSIS — L509 Urticaria, unspecified: Secondary | ICD-10-CM | POA: Diagnosis not present

## 2017-11-20 DIAGNOSIS — L282 Other prurigo: Secondary | ICD-10-CM | POA: Insufficient documentation

## 2017-11-20 DIAGNOSIS — Z87898 Personal history of other specified conditions: Secondary | ICD-10-CM

## 2017-11-20 DIAGNOSIS — E1100 Type 2 diabetes mellitus with hyperosmolarity without nonketotic hyperglycemic-hyperosmolar coma (NKHHC): Secondary | ICD-10-CM

## 2017-11-20 DIAGNOSIS — F1911 Other psychoactive substance abuse, in remission: Secondary | ICD-10-CM

## 2017-11-20 MED ORDER — CETIRIZINE HCL 10 MG PO TABS: 10.0000 mg | ORAL_TABLET | Freq: Every day | ORAL | 11 refills | Status: DC

## 2017-11-20 MED ORDER — LIRAGLUTIDE 18 MG/3ML ~~LOC~~ SOPN: 0.6000 mg | PEN_INJECTOR | Freq: Every day | SUBCUTANEOUS | 3 refills | Status: DC

## 2017-11-20 MED ORDER — HYDROCORTISONE 0.5 % EX CREA: 1.0000 "application " | TOPICAL_CREAM | Freq: Two times a day (BID) | CUTANEOUS | 0 refills | Status: DC

## 2017-11-20 NOTE — Progress Notes (Signed)
    S:     Chief Complaint  Patient presents with  . Medication Management    Diabetes    Patient arrives in good spirits and ambulating indepently.  Presents for diabetes evaluation, education, and management at the request of Dr. Andy Gauss. Patient was referred on 09/25/17.  Patient was last seen by Primary Care Provider on 09/25/17. Pt reports satisfaction after switching to have her PCP be with Dr. Andy Gauss.   Pt presents into clinic with other problems outside of T2DM. Reports consistent itching on U/L extremities that requires frequent oral diphenhydramine and topical hydrocortisone cream administration. Reports excessive sedation from diphenhydramine.  Patient reports Diabetes was diagnosed in 2005 (14 yr hx).  Family/Social History: Mother dx with DM.   Patient reports adherence with medications.  Current diabetes medications include: Lantus 30 units once daily at night and Byetta 5 mcg twice daily. Failed metformin therapy in the past due to GI intolerances (unwilling to try again - added to intolerance list). Past insulins include NPH and Humalog.   Patient denies hypoglycemic events.  Patient reported dietary habits: Eats up to 4 meals/day Usually skips breakfast, eats lunch anytime 12 - 3 PM, then 7 PM dinner, followed by frequent snacking at 11 PM - 12 AM and sometimes around 3 AM.  Recent meal: baked potato and salad Snacks: Diona Browner donut  Patient reported good aerobic exercise habits: walking everyday and frequently walks to outdoor exercise track where she walks 4 laps (~ 1 mi) 3x per week.    Current hypertension medications include: amlodipine 10 mg daily and losartan-HCTZ 100-25 mg daily. BP reading in office today was 146/78 mmHg but pt self reports lower BP readings at home around ~117/68 mmHg.   O:   Lab Results  Component Value Date   HGBA1C 7.1 09/25/2017   Home fasting CBG: 65-70 mg/dL (in AM)  2 hour post-prandial/random CBG: ~170 mg/dL.  10 year  ASCVD risk: 21.7%.  A/P: Diabetes longstanding currently close to goal (A1C < 7) but with frequent self-reported blood glucose levels. Patient denies symptomatic hypoglycemic events and is able to verbalize appropriate hypoglycemia management plan. Patient reports adherence with medication. Control is suboptimal due to dietary indiscretion but control is close to goal due to good medication adherence and physical excercise.  Started Victoza (liraglutide) at 0.6 mg once daily with a plan to increase the dose to 1.2 mg once daily on Sunday (4/7).  Discontinued Byetta .   Decreased dose of basal insulin Lantus (insulin glargine) from 30 to 20 units once daily. Counseled to check blood sugar through her [employer] home meter.   Next A1C anticipated May 2019.    ASCVD risk greater than 7.5%. Continued Aspirin 81 mg and Continued atorvastatin 40 mg.   Hypertension longstanding currently uncontrolled.  Patient reports adherence with medication and lower readings outside of the office. Control is suboptimal possibly due to white coat HTN. Recommended patient measure BP at home and come back to clinic with home BP values.  Consider Amb BP monitoring in the future.    Written patient instructions provided.  Total time in face to face counseling 60 minutes.   Follow up in Pharmacist Clinic Visit 4 weeks.   Patient seen with Hildred Alamin, PharmD Candidate    After visit - call from patient reporting Victoza was not covered by Medicaid  After I called the pharmacy - she needs a prior auth for failing metformin.  It should be covered at that time.

## 2017-11-20 NOTE — Patient Instructions (Addendum)
Thank you for coming into the clinic today!  Great job checking your blood sugars everyday. Keep it up!  Start cetrizine 10 mg once daily or loratadine 10 mg once daily  for itching Stop diphenhydramine (Benadryl) 25 mg as needed We sent in a prescription for hydrocortisone 1% cream that should be covered by Medicaid.   Stop your twice daily Byetta Start Victoza 0.6 mg once daily for 3 days then increase your dose to 1.2 mg once daily after  Schedule  Fri - 0.6 mg  Sat - 0.6 mg Sun - 1.2 mg After - 1.2 mg Decrease your Lantus dose from 30 units to 20 units a day   If you want to speak to me please call the clinic and ask to speak to the pharmacist (Dr. Valentina Lucks).  Follow up with Dr. Valentina Lucks in 4 weeks remember to bring your meter and medications.

## 2017-11-27 LAB — TOXASSURE SELECT 13 (MW), URINE

## 2017-12-04 NOTE — H&P (Signed)
Subjective:    Kayla Gregory is a 57 y.o. female who presents for evaluation of left upper eyelid myogenic ptosis. The pain is described as none. Onset was several months ago. Symptoms have been unchanged since.   Review of Systems Pertinent items are noted in HPI.    Objective:   There were no vitals taken for this visit.  General:  alert, cooperative and appears stated age Skin:  normal Eyes: conjunctivae/corneas clear. PERRL, EOM's intact. Fundi benign. Left upper eyelid myogenic ptosis  Mouth: MMM no lesions Lymph Nodes:  Cervical, supraclavicular, and axillary nodes normal. 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 Discussed the risk of surgery,  and the risks of general anesthetic including MI, CVA, sudden death or even reaction to anesthetic medications. The patient understands the risks, any and all questions were answered to the patient's satisfaction. 2. Follow up: 2 weeks.  Date of Surgery Update (To be completed by Attending Surgeon day of surgery.)

## 2017-12-08 ENCOUNTER — Encounter: Payer: Self-pay | Admitting: Allergy & Immunology

## 2017-12-08 ENCOUNTER — Ambulatory Visit: Payer: Medicaid Other | Admitting: Allergy & Immunology

## 2017-12-08 VITALS — BP 122/82 | HR 73 | Temp 98.4°F | Resp 16 | Ht 60.0 in | Wt 158.6 lb

## 2017-12-08 DIAGNOSIS — T50905D Adverse effect of unspecified drugs, medicaments and biological substances, subsequent encounter: Secondary | ICD-10-CM | POA: Diagnosis not present

## 2017-12-08 DIAGNOSIS — J301 Allergic rhinitis due to pollen: Secondary | ICD-10-CM

## 2017-12-08 NOTE — Addendum Note (Signed)
Addended by: Valentina Shaggy on: 12/08/2017 01:48 PM   Modules accepted: Orders

## 2017-12-08 NOTE — Patient Instructions (Addendum)
1. Adverse effect of drug - We will fill order a 24 hour urine gadolinium test. - We will call you with the results (could take upwards 2-4 weeks to return).  2. Seasonal allergic rhinitis due to pollen - Continue with antihistamines as needed.  3. Return if symptoms worsen or fail to improve.     Please inform us of any Emergency Department visits, hospitalizations, or changes in symptoms. Call us before going to the ED for breathing or allergy symptoms since we might be able to fit you in for a sick visit. Feel free to contact us anytime with any questions, problems, or concerns.  It was a pleasure to meet you today!  Websites that have reliable patient information: 1. American Academy of Asthma, Allergy, and Immunology: www.aaaai.org 2. Food Allergy Research and Education (FARE): foodallergy.org 3. Mothers of Asthmatics: http://www.asthmacommunitynetwork.org 4. American College of Allergy, Asthma, and Immunology: www.acaai.org

## 2017-12-08 NOTE — Progress Notes (Signed)
NEW PATIENT  Date of Service/Encounter:  12/08/17  Referring provider: Marjie Skiff, MD   Assessment:   Adverse effect of drug  Seasonal allergic rhinitis due to pollen  Plan/Recommendations:    [name] presents with concerns of an allergic reaction to gadolinium from two years ago. Upon further history taking, it seems that the reaction started two months AFTER the gadolinium was administered. I find this highly unlikely, and I feel that she is mostly likely experiencing chronic urticaria that happened to start months after the dye was administered. Typically reactions to dyes include anaphylaxis reactions during the procedure rather than a delayed onset reaction. Finally, it did come to light that she is in the midst of a class action lawsuit, therefore all that she really wants is a urine test. We were able to find a [facility] test that could be ordered. She was rather disappointed and clearly frustrated with my medical opinion that her rash has nothing to do with the gadolinium, but I did agree to order the testing. We will let her know when the results come back, but I warned her that this could take a while to complete the test.   1. Adverse effect of drug - We will fill order a 24 hour urine gadolinium test. - We will call you with the results (could take upwards 2-4 weeks to return).  2. Seasonal allergic rhinitis due to pollen - Continue with antihistamines as needed. - She declined testing today.  3. Return if symptoms worsen or fail to improve.  Subjective:   Kayla Gregory is a 57 y.o. female presenting today for evaluation of  Chief Complaint  Patient presents with  . Urticaria    exetremities and back   . Sore    unsure used oatmeal bath     Trinady F Kronk has a history of the following: Patient Active Problem List   Diagnosis Date Noted  . Urticaria 11/20/2017  . Chronic left shoulder pain 12/27/2016  . Acute pain of right shoulder 11/20/2016    . Vomiting 10/09/2016  . Cough 10/09/2016  . Chest pain 10/07/2016  . Trigger finger 10/07/2016  . Great toe pain 05/10/2016  . Right ear pain 03/31/2016  . Partial thickness burn of ankle 02/02/2016  . Healthcare maintenance 12/28/2015  . Stable angina ([place]) 12/22/2015  . Type 2 diabetes mellitus ([place]) 12/22/2015  . Hypertension associated with diabetes ([place]) 12/22/2015  . GERD (gastroesophageal reflux disease) 12/22/2015  . Hepatitis, chronic ([place]) 12/22/2015    History obtained from: chart review and patient.  Kayla Gregory was referred by Marjie Skiff, MD.     Kayla Gregory is a 57 y.o. female presenting with concern for gadolinium dye testing. She had an MRI performed in 2017 in [place]. She had never had the gadolinium dye used during a procedure.  She is not forthcoming about why she had the procedure in the first place. In any case, she reports that she developed a rash.  Eventually, she shares that it did not take place until 2 months after the MRI was performed she reports she developed a rash over her entire body with sores.  It first started on the lower extremities and then spread to the rest of her body.  Even 2 years later, she continues to have these lesions.  They are no longer raised, but they were initially.  They were also initially pruritic, but now they do not seem to cause any itching.  She is using  cocoa butter as well as an oatmeal almond soap to help calm the lesions.  She did take Benadryl, but her primary care provider changed her to Zyrtec.  She does tell me that she saw a dermatologist at one point, who did not help at all.  She is unable to remember the name.  She is also using aloe vera especially after showering to lock in the moisture.  Prior to the MRI, she never had any problem with rashes.  She denies a history of urticaria as well as eczema.   It eventually comes out that she is looking to have a urine test performed to make sure there is no gadolinium  left in her system.  She also mentions that she needs an aluminum level.  The entire visit is rather confusing and it comes out that she is in the midst of class action lawsuit dealing with gadolinium exposure.  She calls her lawyer's office to confirm the test that needs to be performed, which is a heavy metal urine test.  We did look up the test to lab core database, and it did not include gadolinium any of the 2 heavy metal urine test.  However, we did find a test offered through [name] that evaluates for gadolinium levels.  She does have a history of seasonal allergy symptoms but uses no medications.  She may have been allergy tested in the past, but she is not interested in that today. She has no history of asthma. Otherwise, there is no history of other atopic diseases, including stinging insect allergies or urticaria. There is no significant infectious history. Vaccinations are up to date.    Past Medical History: Patient Active Problem List   Diagnosis Date Noted  . Urticaria 11/20/2017  . Chronic left shoulder pain 12/27/2016  . Acute pain of right shoulder 11/20/2016  . Vomiting 10/09/2016  . Cough 10/09/2016  . Chest pain 10/07/2016  . Trigger finger 10/07/2016  . Great toe pain 05/10/2016  . Right ear pain 03/31/2016  . Partial thickness burn of ankle 02/02/2016  . Healthcare maintenance 12/28/2015  . Stable angina ([place]) 12/22/2015  . Type 2 diabetes mellitus ([name]) 12/22/2015  . Hypertension associated with diabetes ([place]) 12/22/2015  . GERD (gastroesophageal reflux disease) 12/22/2015  . Hepatitis, chronic ([place]) 12/22/2015    Medication List:  Allergies as of 12/08/2017      Reactions   Metformin And Related Diarrhea   GI symptoms including abdominal pain and diarrhea - Pt not interested in taking again   Omeprazole Rash   Denies airway involvement   Ranitidine Hcl Rash   Fixed drug reaction - Denies airway involvement      Medication List        Accurate as of  12/08/17  1:11 PM. Always use your most recent med list.          ACCU-CHEK AVIVA PLUS w/Device Kit 1 application by Does not apply route 4 (four) times daily -  with meals and at bedtime.   accu-chek softclix lancets Use as instructed   amLODipine 10 MG tablet Commonly known as:  NORVASC Take 1 tablet (10 mg total) by mouth daily.   aspirin 81 MG EC tablet Commonly known as:  ASPIRIN 81 Take 1 tablet (81 mg total) by mouth daily.   atorvastatin 40 MG tablet Commonly known as:  LIPITOR Take 1 tablet (40 mg total) by mouth daily.   cetirizine 10 MG tablet Commonly known as:  ZYRTEC Take 1  tablet (10 mg total) by mouth daily.   Fish Oil 1000 MG Caps Take 2 capsules by mouth daily.   FLUoxetine 20 MG capsule Commonly known as:  PROZAC Take 1 capsule (20 mg total) by mouth daily.   gabapentin 300 MG capsule Commonly known as:  NEURONTIN Take 1 capsule (300 mg total) by mouth 3 (three) times daily.   glucose blood test strip Commonly known as:  ACCU-CHEK AVIVA Use to check blood sugars 3 times a day. Code E11.9.   Insulin Glargine 100 UNIT/ML Solostar Pen Commonly known as:  LANTUS Inject 30 Units into the skin daily at 10 pm.   liraglutide 18 MG/3ML Sopn Commonly known as:  VICTOZA Inject 0.1-0.2 mLs (0.6-1.2 mg total) into the skin daily.   losartan-hydrochlorothiazide 100-25 MG tablet Commonly known as:  HYZAAR Take 1 tablet by mouth daily.   pantoprazole 40 MG tablet Commonly known as:  PROTONIX Take 1 tablet (40 mg total) by mouth daily.   Pen Needles 31G X 5 MM Misc 1 application by Does not apply route 2 (two) times daily before a meal.   traZODone 25 mg Tabs tablet Commonly known as:  DESYREL Take 25 mg by mouth at bedtime.   vitamin B-12 1000 MCG tablet Commonly known as:  CYANOCOBALAMIN Take 1,000 mcg by mouth daily.   ziprasidone 20 MG capsule Commonly known as:  GEODON Take 1 capsule (20 mg total) by mouth daily.       Birth History:  non-contributory.   Developmental History: non-contributory.   Past Surgical History: Past Surgical History:  Procedure Laterality Date  . JOINT REPLACEMENT Left    Hip  . TONSILLECTOMY       Family History: Family History  Problem Relation Age of Onset  . Heart attack Sister 62  . Heart attack Brother 52  . Heart attack Mother 25     Social History: Maxine lives at home with her mother who is 31 years old.  She lives in an apartment.  There is no mildew or roach problems in the home.  She has gas and electric heating.  She has central cooling.  There are no animals inside or outside of the home.  She smoked for 17 years from 33 through 1989.  She denies exposures to fumes or chemicals.  She is currently on disability for comorbidities associated with psychiatric disease.    Review of Systems: a 14-point review of systems is pertinent for what is mentioned in HPI.  Otherwise, all other systems were negative. Constitutional: negative other than that listed in the HPI Eyes: negative other than that listed in the HPI Ears, nose, mouth, throat, and face: negative other than that listed in the HPI Respiratory: negative other than that listed in the HPI Cardiovascular: negative other than that listed in the HPI Gastrointestinal: negative other than that listed in the HPI Genitourinary: negative other than that listed in the HPI Integument: negative other than that listed in the HPI Hematologic: negative other than that listed in the HPI Musculoskeletal: negative other than that listed in the HPI Neurological: negative other than that listed in the HPI Allergy/Immunologic: negative other than that listed in the HPI    Objective:   Blood pressure 122/82, pulse 73, temperature 98.4 F (36.9 C), temperature source Oral, resp. rate 16, height 5' (1.524 m), weight 158 lb 9.6 oz (71.9 kg), SpO2 96 %. Body mass index is 30.97 kg/m.   Physical Exam:  General: Alert,  interactive, in no  acute distress. Mostly pleasant female.  Eyes: No conjunctival injection bilaterally, no discharge on the right, no discharge on the left and no Horner-Trantas dots present. PERRL bilaterally. EOMI without pain. No photophobia.  Ears: Right TM pearly gray with normal light reflex, Left TM pearly gray with normal light reflex, Right TM intact without perforation and Left TM intact without perforation.  Nose/Throat: External nose within normal limits and septum midline. Turbinates edematous and pale with clear discharge. Posterior oropharynx mildly erythematous without cobblestoning in the posterior oropharynx. Tonsils 2+ without exudates.  Tongue without thrush. Neck: Supple without thyromegaly. Trachea midline. Adenopathy: no enlarged lymph nodes appreciated in the anterior cervical, occipital, axillary, epitrochlear, inguinal, or popliteal regions. Lungs: Clear to auscultation without wheezing, rhonchi or rales. No increased work of breathing. CV: Normal S1/S2. No murmurs. Capillary refill <2 seconds.  Abdomen: Nondistended, nontender. No guarding or rebound tenderness. Bowel sounds present in all fields and hypoactive  Skin: Warm and dry, without lesions or rashes. There are multiple hyperpigmented lesions over her entire body. These are flat without any surrounding erythema.  Extremities:  No clubbing, cyanosis or edema. Neuro:   Grossly intact. No focal deficits appreciated. Responsive to questions.  Diagnostic studies: none    Salvatore Marvel, MD Allergy and [place] [facility] [place]

## 2017-12-15 ENCOUNTER — Telehealth: Payer: Self-pay | Admitting: Family Medicine

## 2017-12-15 ENCOUNTER — Other Ambulatory Visit: Payer: Self-pay | Admitting: Family Medicine

## 2017-12-15 MED ORDER — AMLODIPINE BESYLATE 10 MG PO TABS: 10.0000 mg | ORAL_TABLET | Freq: Every day | ORAL | 11 refills | Status: DC

## 2017-12-15 MED ORDER — LOSARTAN POTASSIUM-HCTZ 100-25 MG PO TABS: 1.0000 | ORAL_TABLET | Freq: Every day | ORAL | 1 refills | Status: DC

## 2017-12-15 NOTE — Telephone Encounter (Signed)
Needs refills on amlodipine and losartan.  [facility] on [address]

## 2017-12-15 NOTE — Telephone Encounter (Signed)
Page,   Dennis Bast can verify with patient but she should have refill on amlodipine and losartan-HCTZ. Please let me know if I need to send new script  Thank you  Marjie Skiff, MD [place], PGY-2

## 2017-12-20 LAB — OTHER LAB TEST

## 2017-12-22 ENCOUNTER — Telehealth: Payer: Self-pay | Admitting: Allergy & Immunology

## 2017-12-22 NOTE — Telephone Encounter (Signed)
Thanks for taking care of that, Kayla.  Salvatore Marvel, MD Allergy and [place] [place]

## 2017-12-22 NOTE — Telephone Encounter (Signed)
Kayla Gregory's urine gadolinium test came back and was within normal limits. Could someone call Kayla Gregory to let her know the results?  Please send a a copy of the lab to the patient.  Salvatore Marvel, MD Allergy and [facility] [place]

## 2017-12-22 NOTE — Telephone Encounter (Signed)
I left a detailed message on voicemail advising patient of normal results. I have also put a copy of results in the mail for her today.

## 2017-12-23 NOTE — Telephone Encounter (Signed)
Not a problem at all.

## 2017-12-24 ENCOUNTER — Ambulatory Visit: Payer: Medicaid Other | Admitting: Family Medicine

## 2017-12-24 ENCOUNTER — Encounter (HOSPITAL_COMMUNITY): Payer: Self-pay | Admitting: *Deleted

## 2017-12-24 ENCOUNTER — Other Ambulatory Visit: Payer: Self-pay

## 2017-12-24 NOTE — Progress Notes (Signed)
Pt denies SOB, chest pain, and being under the care of a cardiologist. Pt denies having a cardiac cath but stated that a stress test was performed > 10 years ago in [place]. Pt denies having an EKG and chest x ray within the last year. Pt made aware to stop taking vitamins, fish oil and herbal medications. Do not take any NSAIDs ie: Ibuprofen, Advil, Naproxen (Aleve), Motrin, BC and Goody Powder. Pt made aware to take only 10 units of Lantus tonight and no Victoza on DOS. Pt made aware to check BG every 2 hours prior to arrival to hospital on DOS. Pt made aware to treat a BG < 70 with  4 ounces of apple  juice, wait 15 minutes after intervention to recheck BG, if BG remains < 70, call Short Stay unit to speak with a nurse. Pt stated that she does not want to take any medications other than Norvasc on morning of surgery. Pt verbalized understanding of all pre-op instructions.

## 2017-12-25 ENCOUNTER — Ambulatory Visit (HOSPITAL_COMMUNITY): Payer: Medicaid Other | Admitting: Certified Registered Nurse Anesthetist

## 2017-12-25 ENCOUNTER — Encounter (HOSPITAL_COMMUNITY)
Admission: RE | Disposition: A | Discharge status: Home / Self Care | Payer: Self-pay | Source: Ambulatory Visit | Attending: Oculoplastics Ophthalmology

## 2017-12-25 ENCOUNTER — Encounter (HOSPITAL_COMMUNITY): Payer: Self-pay | Admitting: *Deleted

## 2017-12-25 ENCOUNTER — Ambulatory Visit (HOSPITAL_COMMUNITY)
Admission: RE | Admit: 2017-12-25 | Discharge: 2017-12-25 | Disposition: A | Discharge status: Home / Self Care | Payer: Medicaid Other | Source: Ambulatory Visit | Attending: Oculoplastics Ophthalmology | Admitting: Oculoplastics Ophthalmology

## 2017-12-25 DIAGNOSIS — F209 Schizophrenia, unspecified: Secondary | ICD-10-CM | POA: Insufficient documentation

## 2017-12-25 DIAGNOSIS — E119 Type 2 diabetes mellitus without complications: Secondary | ICD-10-CM | POA: Insufficient documentation

## 2017-12-25 DIAGNOSIS — Z87891 Personal history of nicotine dependence: Secondary | ICD-10-CM | POA: Insufficient documentation

## 2017-12-25 DIAGNOSIS — K219 Gastro-esophageal reflux disease without esophagitis: Secondary | ICD-10-CM | POA: Diagnosis not present

## 2017-12-25 DIAGNOSIS — I10 Essential (primary) hypertension: Secondary | ICD-10-CM | POA: Insufficient documentation

## 2017-12-25 DIAGNOSIS — Z7982 Long term (current) use of aspirin: Secondary | ICD-10-CM | POA: Diagnosis not present

## 2017-12-25 DIAGNOSIS — Z79899 Other long term (current) drug therapy: Secondary | ICD-10-CM | POA: Diagnosis not present

## 2017-12-25 DIAGNOSIS — H02422 Myogenic ptosis of left eyelid: Secondary | ICD-10-CM | POA: Diagnosis not present

## 2017-12-25 DIAGNOSIS — Z7951 Long term (current) use of inhaled steroids: Secondary | ICD-10-CM | POA: Insufficient documentation

## 2017-12-25 HISTORY — DX: Gastro-esophageal reflux disease without esophagitis: K21.9

## 2017-12-25 HISTORY — PX: PTOSIS REPAIR: SHX6568

## 2017-12-25 HISTORY — DX: Unspecified osteoarthritis, unspecified site: M19.90

## 2017-12-25 HISTORY — DX: Myogenic ptosis of left eyelid: H02.422

## 2017-12-25 HISTORY — DX: Other allergic rhinitis: J30.89

## 2017-12-25 LAB — BASIC METABOLIC PANEL
Anion gap: 9 (ref 5–15)
BUN: 14 mg/dL (ref 6–20)
CO2: 26 mmol/L (ref 22–32)
Calcium: 9.1 mg/dL (ref 8.9–10.3)
Chloride: 108 mmol/L (ref 101–111)
Creatinine, Ser: 1.18 mg/dL — ABNORMAL HIGH (ref 0.44–1.00)
GFR calc non Af Amer: 49 mL/min — ABNORMAL LOW (ref >60)
GFR, EST AFRICAN AMERICAN: 57 mL/min — AB (ref >60)
Glucose, Bld: 86 mg/dL (ref 65–99)
POTASSIUM: 4.1 mmol/L (ref 3.5–5.1)
SODIUM: 143 mmol/L (ref 135–145)

## 2017-12-25 LAB — CBC
HCT: 35.8 % — ABNORMAL LOW (ref 36.0–46.0)
Hemoglobin: 12.1 g/dL (ref 12.0–15.0)
MCH: 30.4 pg (ref 26.0–34.0)
MCHC: 33.8 g/dL (ref 30.0–36.0)
MCV: 89.9 fL (ref 78.0–100.0)
Platelets: 171 10*3/uL (ref 150–400)
RBC: 3.98 MIL/uL (ref 3.87–5.11)
RDW: 12.7 % (ref 11.5–15.5)
WBC: 3.9 10*3/uL — ABNORMAL LOW (ref 4.0–10.5)

## 2017-12-25 LAB — GLUCOSE, CAPILLARY
GLUCOSE-CAPILLARY: 82 mg/dL (ref 65–99)
Glucose-Capillary: 55 mg/dL — ABNORMAL LOW (ref 65–99)
Glucose-Capillary: 179 mg/dL — ABNORMAL HIGH (ref 65–99)

## 2017-12-25 LAB — PROTIME-INR
INR: 1.02
PROTHROMBIN TIME: 13.4 s (ref 11.4–15.2)

## 2017-12-25 LAB — HEMOGLOBIN A1C
Hgb A1c MFr Bld: 5.7 % — ABNORMAL HIGH (ref 4.8–5.6)
MEAN PLASMA GLUCOSE: 116.89 mg/dL

## 2017-12-25 SURGERY — REPAIR, BLEPHAROPTOSIS
Anesthesia: Monitor Anesthesia Care | Site: Eye | Laterality: Left

## 2017-12-25 MED ORDER — BUPIVACAINE HCL (PF) 0.5 % IJ SOLN
INTRAMUSCULAR | Status: AC
  Filled 2017-12-25: qty 10

## 2017-12-25 MED ORDER — TOBRAMYCIN-DEXAMETHASONE 0.3-0.1 % OP OINT
TOPICAL_OINTMENT | OPHTHALMIC | Status: DC | PRN
  Administered 2017-12-25: 1 via OPHTHALMIC

## 2017-12-25 MED ORDER — LIDOCAINE HCL (PF) 2 % IJ SOLN
INTRAMUSCULAR | Status: AC
  Filled 2017-12-25: qty 10

## 2017-12-25 MED ORDER — SODIUM CHLORIDE 0.9 % IV SOLN
INTRAVENOUS | Status: DC
  Administered 2017-12-25: 09:00:00 via INTRAVENOUS

## 2017-12-25 MED ORDER — LIDOCAINE HCL (CARDIAC) PF 100 MG/5ML IV SOSY
PREFILLED_SYRINGE | INTRAVENOUS | Status: DC | PRN
  Administered 2017-12-25: 60 mg via INTRAVENOUS

## 2017-12-25 MED ORDER — PROPOFOL 10 MG/ML IV BOLUS
INTRAVENOUS | Status: DC | PRN
  Administered 2017-12-25: 150 mg via INTRAVENOUS

## 2017-12-25 MED ORDER — MIDAZOLAM HCL 2 MG/2ML IJ SOLN
INTRAMUSCULAR | Status: AC
  Filled 2017-12-25: qty 2

## 2017-12-25 MED ORDER — LIDOCAINE-EPINEPHRINE 1 %-1:100000 IJ SOLN
INTRAMUSCULAR | Status: AC
  Filled 2017-12-25: qty 1

## 2017-12-25 MED ORDER — PROPOFOL 10 MG/ML IV BOLUS
INTRAVENOUS | Status: AC
  Filled 2017-12-25: qty 20

## 2017-12-25 MED ORDER — FENTANYL CITRATE (PF) 100 MCG/2ML IJ SOLN
INTRAMUSCULAR | Status: DC | PRN
  Administered 2017-12-25: 50 ug via INTRAVENOUS

## 2017-12-25 MED ORDER — TOBRAMYCIN-DEXAMETHASONE 0.3-0.1 % OP OINT
TOPICAL_OINTMENT | OPHTHALMIC | Status: AC
  Filled 2017-12-25: qty 3.5

## 2017-12-25 MED ORDER — OXYCODONE HCL 5 MG PO TABS: 5.0000 mg | ORAL_TABLET | Freq: Once | ORAL | Status: DC | PRN

## 2017-12-25 MED ORDER — MIDAZOLAM HCL 5 MG/5ML IJ SOLN
INTRAMUSCULAR | Status: DC | PRN
  Administered 2017-12-25: 2 mg via INTRAVENOUS

## 2017-12-25 MED ORDER — DEXTROSE 50 % IV SOLN
25.0000 mL | Freq: Once | INTRAVENOUS | Status: AC
  Administered 2017-12-25: 25 mL via INTRAVENOUS

## 2017-12-25 MED ORDER — DEXTROSE 50 % IV SOLN
INTRAVENOUS | Status: AC
  Filled 2017-12-25: qty 50

## 2017-12-25 MED ORDER — HYDROMORPHONE HCL 2 MG/ML IJ SOLN: 0.2500 mg | INTRAMUSCULAR | Status: DC | PRN

## 2017-12-25 MED ORDER — TETRACAINE HCL 0.5 % OP SOLN
OPHTHALMIC | Status: AC
  Filled 2017-12-25: qty 4

## 2017-12-25 MED ORDER — STERILE WATER FOR IRRIGATION IR SOLN
Status: DC | PRN
  Administered 2017-12-25: 1000 mL

## 2017-12-25 MED ORDER — 0.9 % SODIUM CHLORIDE (POUR BTL) OPTIME
TOPICAL | Status: DC | PRN
  Administered 2017-12-25: 1000 mL

## 2017-12-25 MED ORDER — OXYCODONE HCL 5 MG/5ML PO SOLN: 5.0000 mg | Freq: Once | ORAL | Status: DC | PRN

## 2017-12-25 MED ORDER — BSS IO SOLN
INTRAOCULAR | Status: AC
  Filled 2017-12-25: qty 15

## 2017-12-25 MED ORDER — LIDOCAINE-EPINEPHRINE 1 %-1:100000 IJ SOLN
INTRAMUSCULAR | Status: DC | PRN
  Administered 2017-12-25: .5 mL via INTRAMUSCULAR

## 2017-12-25 MED ORDER — PROMETHAZINE HCL 25 MG/ML IJ SOLN: 6.2500 mg | INTRAMUSCULAR | Status: DC | PRN

## 2017-12-25 MED ORDER — FENTANYL CITRATE (PF) 250 MCG/5ML IJ SOLN
INTRAMUSCULAR | Status: AC
  Filled 2017-12-25: qty 5

## 2017-12-25 MED ORDER — ONDANSETRON HCL 4 MG/2ML IJ SOLN
INTRAMUSCULAR | Status: DC | PRN
  Administered 2017-12-25: 4 mg via INTRAVENOUS

## 2017-12-25 MED ORDER — DEXAMETHASONE SODIUM PHOSPHATE 10 MG/ML IJ SOLN
INTRAMUSCULAR | Status: DC | PRN
  Administered 2017-12-25: 10 mg via INTRAVENOUS

## 2017-12-25 MED ORDER — BSS IO SOLN
INTRAOCULAR | Status: DC | PRN
  Administered 2017-12-25: 15 mL

## 2017-12-25 SURGICAL SUPPLY — 23 items
APL SRG 3 HI ABS STRL LF PLS (MISCELLANEOUS) ×1
APPLICATOR DR MATTHEWS STRL (MISCELLANEOUS) ×2 IMPLANT
CLSR STERI-STRIP ANTIMIC 1/2X4 (GAUZE/BANDAGES/DRESSINGS) ×2 IMPLANT
COVER SURGICAL LIGHT HANDLE (MISCELLANEOUS) ×2 IMPLANT
DRAPE ORTHO SPLIT 87X125 STRL (DRAPES) ×2 IMPLANT
DRAPE SURG 17X23 STRL (DRAPES) ×1 IMPLANT
GLOVE BIO SURGEON STRL SZ7.5 (GLOVE) ×4 IMPLANT
GLOVE SURG SS PI 6.0 STRL IVOR (GLOVE) ×1 IMPLANT
GLOVE SURG SS PI 6.5 STRL IVOR (GLOVE) ×1 IMPLANT
GOWN STRL REUS W/ TWL LRG LVL3 (GOWN DISPOSABLE) ×2 IMPLANT
GOWN STRL REUS W/TWL LRG LVL3 (GOWN DISPOSABLE) ×4
NDL HYPO 30X.5 LL (NEEDLE) IMPLANT
NDL PRECISIONGLIDE 27X1.5 (NEEDLE) IMPLANT
NEEDLE HYPO 30X.5 LL (NEEDLE) ×2 IMPLANT
NEEDLE PRECISIONGLIDE 27X1.5 (NEEDLE) ×2 IMPLANT
NS IRRIG 1000ML POUR BTL (IV SOLUTION) ×2 IMPLANT
PACK CATARACT CUSTOM (CUSTOM PROCEDURE TRAY) ×2 IMPLANT
PAD ARMBOARD 7.5X6 YLW CONV (MISCELLANEOUS) ×4 IMPLANT
STRIP CLOSURE SKIN 1/2X4 (GAUZE/BANDAGES/DRESSINGS) ×2 IMPLANT
SUT PLAIN 5 0 P 3 18 (SUTURE) ×2 IMPLANT
SUT SILK 4 0 P 3 (SUTURE) ×2 IMPLANT
TOWEL GREEN STERILE FF (TOWEL DISPOSABLE) ×1 IMPLANT
WATER STERILE IRR 1000ML POUR (IV SOLUTION) ×2 IMPLANT

## 2017-12-25 NOTE — Transfer of Care (Signed)
Immediate Anesthesia Transfer of Care Note  Patient: Kayla Gregory  Procedure(s) Performed: INTERNAL PTOSIS REPAIR LEFT EYE (Left Eye)  Patient Location: PACU  Anesthesia Type:General  Level of Consciousness: awake and alert   Airway & Oxygen Therapy: Patient Spontanous Breathing and Patient connected to nasal cannula oxygen  Post-op Assessment: Report given to RN and Post -op Vital signs reviewed and stable  Post vital signs: Reviewed and stable  Last Vitals:  Vitals Value Taken Time  BP 133/74 12/25/2017 11:16 AM  Temp    Pulse 65 12/25/2017 11:18 AM  Resp 13 12/25/2017 11:18 AM  SpO2 99 % 12/25/2017 11:18 AM  Vitals shown include unvalidated device data.  Last Pain:  Vitals:   12/25/17 0835  TempSrc: Oral         Complications: No apparent anesthesia complications

## 2017-12-25 NOTE — Brief Op Note (Signed)
12/25/2017  9:58 AM  PATIENT:  [name]  57 y.o. female  PRE-OPERATIVE DIAGNOSIS:  MYOGENIC PTOSIS OF LEFT EYELID  POST-OPERATIVE DIAGNOSIS:  * No post-op diagnosis entered *  PROCEDURE:  Procedure(s): INTERNAL PTOSIS REPAIR (Left)  SURGEON:  Surgeon(s) and Role:    * Reema Chick, Peyton Najjar, MD - Primary  PHYSICIAN ASSISTANT: none  ASSISTANTS: none   ANESTHESIA:   IV sedation  EBL:  none  BLOOD ADMINISTERED:none  DRAINS: none   LOCAL MEDICATIONS USED:  BUPIVICAINE  and LIDOCAINE   SPECIMEN:  No Specimen  DISPOSITION OF SPECIMEN:  N/A  COUNTS:  YES  TOURNIQUET:  * No tourniquets in log *  DICTATION: .Note written in EPIC  PLAN OF CARE: Discharge to home after PACU  PATIENT DISPOSITION:  PACU - hemodynamically stable.   Delay start of Pharmacological VTE agent (>24hrs) due to surgical blood loss or risk of bleeding: no

## 2017-12-25 NOTE — Discharge Instructions (Addendum)
Place ice on LEFT upper eyelid for 20-30 mins every 2 hours for 2 days. Place ointment(Erythromycin or Tobradex Ophthalmic Ointment) in eye at nighttime for one week.  Place one drop(Maxitrol Ophthalmic Eyedrops) in right eye two times a day for one week.  Leave Steri-strip in place until post-operative appointment. If you have an eye patch. Remove it in 24hrs.

## 2017-12-25 NOTE — Interval H&P Note (Signed)
History and Physical Interval Note:  12/25/2017 9:57 AM  Kayla Gregory  has presented today for surgery, with the diagnosis of MYOGENIC PTOSIS OF LEFT EYELID  The various methods of treatment have been discussed with the patient and family. After consideration of risks, benefits and other options for treatment, the patient has consented to  Procedure(s): Zinc (Left) as a surgical intervention .  The patient's history has been reviewed, patient examined, no change in status, stable for surgery.  I have reviewed the patient's chart and labs.  Questions were answered to the patient's satisfaction.     Clista Bernhardt

## 2017-12-25 NOTE — Anesthesia Preprocedure Evaluation (Addendum)
Anesthesia Evaluation  Patient identified by MRN, date of birth, ID band Patient awake    Reviewed: Allergy & Precautions, NPO status , Patient's Chart, lab work & pertinent test results  Airway Mallampati: II  TM Distance: >3 FB Neck ROM: Full    Dental no notable dental hx.    Pulmonary former smoker,    Pulmonary exam normal breath sounds clear to auscultation       Cardiovascular hypertension, Pt. on medications Normal cardiovascular exam Rhythm:Regular Rate:Normal  ECG: NSR, rate 62   Neuro/Psych PSYCHIATRIC DISORDERS Schizophrenia negative neurological ROS     GI/Hepatic GERD  Medicated and Controlled,(+) Hepatitis -, C  Endo/Other  diabetes, Insulin Dependent  Renal/GU negative Renal ROS     Musculoskeletal negative musculoskeletal ROS (+)   Abdominal   Peds  Hematology  (+) anemia , HLD   Anesthesia Other Findings MYOGENIC PTOSIS OF LEFT EYELID  Reproductive/Obstetrics                            Anesthesia Physical Anesthesia Plan  ASA: III  Anesthesia Plan: General   Post-op Pain Management:    Induction: Intravenous  PONV Risk Score and Plan: 2  Airway Management Planned: LMA  Additional Equipment:   Intra-op Plan:   Post-operative Plan: Extubation in OR  Informed Consent: I have reviewed the patients History and Physical, chart, labs and discussed the procedure including the risks, benefits and alternatives for the proposed anesthesia with the patient or authorized representative who has indicated his/her understanding and acceptance.   Dental advisory given  Plan Discussed with: CRNA  Anesthesia Plan Comments:       Anesthesia Quick Evaluation

## 2017-12-25 NOTE — Anesthesia Procedure Notes (Signed)
Procedure Name: Intubation Date/Time: 12/25/2017 10:42 AM Performed by: Kimberly Coye T, CRNA Pre-anesthesia Checklist: Patient identified, Emergency Drugs available, Suction available and Patient being monitored Patient Re-evaluated:Patient Re-evaluated prior to induction Oxygen Delivery Method: Circle system utilized Preoxygenation: Pre-oxygenation with 100% oxygen Induction Type: IV induction Ventilation: Mask ventilation without difficulty LMA: LMA inserted LMA Size: 4.0 Number of attempts: 1 Airway Equipment and Method: Patient positioned with wedge pillow Placement Confirmation: positive ETCO2 and breath sounds checked- equal and bilateral Tube secured with: Tape Dental Injury: Teeth and Oropharynx as per pre-operative assessment

## 2017-12-25 NOTE — Op Note (Signed)
Procedure(s): INTERNAL PTOSIS REPAIR Procedure Note  Kayla Gregory female 57 y.o. 12/25/2017  Procedure(s) and Anesthesia Type:    * INTERNAL PTOSIS REPAIR - General  Surgeon(s) and Role:    * Virdia Ziesmer, Peyton Najjar, MD - Primary     Surgeon: Clista Bernhardt   Assistants: none ASA Class: 1  Procedure Detail  INTERNAL PTOSIS REPAIR PREOP DIAGNOSIS:    LEFT Upper eyelid myogenic ptosis POSTOP DIAGNOSIS:    Same  PROCEDURE:    Internal Ptosis Repair, LEFT Upper Eyelid 40102  SURGEON:    Georjean Mode MD  ANESTHESIA:  Monitored Anesthesia Care with local standard oculoplastic block     Lidocaine 2% with Epinephrine 1:100,000/Marcaine 0.75%     Total volume used: 2 mL  COMPLICATIONS:  None BLOOD LOSS:  Minimal SPECIMEN:  None  INDICATIONS:  This patient presents with LEFT upper eyelid position that is too low (MRD1 < 2) and good levator function. The position of the eyelid is causing visual dysfunction that interferes with tasks of daily living including reading and computer use due to the superiorvisual field defect.  Informed consent had been provided prior to the day of surgery at the clinic visit. A handout was provided and the patient was asked to sign an eyelid informed consent specific to this procedure. All questions were answered.  The patient understands that the goal of surgery is to improve visual function. This is not a cosmetic procedure, no skin or fat is being removed. Prior to entering the operative suite, the general surgical consent was signed. All questions were answered.  The patient understands the risks of surgery include but are not limited to bleeding, infection, scar formation, asymmetry, the need for additional surgical intervention and other less common outcomes noted on the specific eyelid surgery consent and anesthesia risks listed on the anesthesia consent. The patient accepts the risks and desires to proceed with surgery as the position of the upper  eyelid blocks the superior visual field interrupting tasks of daily living to include reading, driving and computer use.   PROCEDURE :  The patient was taken to the operating room and placed in the supine position with the usual monitoring in place. The patient was given a drop of Proparacaine 0.5%, then prepped and draped in the usual standard fashion. The lateral eyelid was injected with standard oculoplastic block in the region of entry and exit for the running suture. The eyelid was then everted over a desmarres retractor and injected with the standard oculoplastic block.    In clinic, the patient had complete correction with one drop of phenylephrine. The goal was to perform a 7 mm tuck in the conjunctiva, so the conjunctiva was marked at 3.5 mm from the tarsal border in a medial, central and lateral position with three interrupted 6-0 silk sutures which were then secured with a steristrip. The sutures were then pulled inferiorly and the putterman ptosis clamp was placed in standard fashion.    Using the Putterman ptosis clamp to provide traction, a 5-0 gut suture was passed through the upper lid laterally approximately 2 mm from the eyelashes and passed in a running fashion from lateral aspect of the Putterman ptosis clamp medially and back again laterally and then brought out through the skin adjacent to the entry location and tied externally to avoid corneal irritation.  The silk suture and approximately 1 mm of conjunctiva and mueller's muscle was excised in such a fashion to create a smooth internal wound edge. The  external knot was then covered with a 1 cm steristrip.  Erythromycin 0.3% ointment were administered to left eyes, The patient was taken to the PACU in good condition. Findings: none Drains: none         Total IV Fluids: <515ml  Blood Given: none          Implants: none        Complications:  * No complications entered in OR log *         Disposition: PACU - hemodynamically  stable.         Condition: stable

## 2017-12-25 NOTE — Anesthesia Postprocedure Evaluation (Signed)
Anesthesia Post Note  Patient: Kayla Gregory  Procedure(s) Performed: INTERNAL PTOSIS REPAIR LEFT EYE (Left Eye)     Patient location during evaluation: PACU Anesthesia Type: General Level of consciousness: awake and alert Pain management: pain level controlled Vital Signs Assessment: post-procedure vital signs reviewed and stable Respiratory status: spontaneous breathing, nonlabored ventilation, respiratory function stable and patient connected to nasal cannula oxygen Cardiovascular status: blood pressure returned to baseline and stable Postop Assessment: no apparent nausea or vomiting Anesthetic complications: no    Last Vitals:  Vitals:   12/25/17 1157 12/25/17 1200  BP: (!) 155/80 (!) 145/78  Pulse: 63 63  Resp: (!) 22 20  Temp:    SpO2: 98% 99%    Last Pain:  Vitals:   12/25/17 0835  TempSrc: Oral                 Gracee Ratterree P Keira Bohlin

## 2017-12-26 ENCOUNTER — Encounter (HOSPITAL_COMMUNITY): Payer: Self-pay | Admitting: Oculoplastics Ophthalmology

## 2017-12-29 ENCOUNTER — Other Ambulatory Visit: Payer: Self-pay

## 2017-12-29 ENCOUNTER — Ambulatory Visit: Payer: Medicaid Other | Admitting: Family Medicine

## 2017-12-29 ENCOUNTER — Encounter: Payer: Self-pay | Admitting: Family Medicine

## 2017-12-29 DIAGNOSIS — Z794 Long term (current) use of insulin: Secondary | ICD-10-CM

## 2017-12-29 DIAGNOSIS — E1100 Type 2 diabetes mellitus with hyperosmolarity without nonketotic hyperglycemic-hyperosmolar coma (NKHHC): Secondary | ICD-10-CM | POA: Diagnosis not present

## 2017-12-29 NOTE — Patient Instructions (Signed)
It was great seeing you today! We have addressed the following issues today  1. Your A1c is 5.7 down from 7.1. That is very good, keep working on your diet and exercise. I will see you in three months.  If we did any lab work today, and the results require attention, either me or my nurse will get in touch with you. If everything is normal, you will get a letter in mail and a message via . If you don't hear from [place] in two weeks, please give [place] a call. Otherwise, we look forward to seeing you again at your next visit. If you have any questions or concerns before then, please call the clinic at [phone].  Please bring all your medications to every doctors visit  Sign up for My Chart to have easy access to your labs results, and communication with your Primary care physician. Please ask Front Desk for some assistance.   Please check-out at the front desk before leaving the clinic.    Take Care,   Dr. Andy Gauss

## 2017-12-29 NOTE — Progress Notes (Signed)
   Subjective:    Patient ID: Kayla Gregory, female    DOB: February 10, 1961, 57 y.o.   MRN: [id number]   CC: Follow up for Diabetes   HPI: Patient is a 57 yo female who presents today to follow upon T2DM management. Patient recently (12/25/2017) had a left eye ptosis repair for which she had blood work done including A1c. Patient was also seen by Dr. Valentina Lucks who started her on Victoza and stopped her Byetta. He also decreased her Insulin from 30U to 20U daily. Last A1c was 7.1 but patient reports that she has been doing well with her diet although she reports gaining weight. Patient denies any episodes of hyperglycemia. She continue to monitor her BG daily with glucometer.  Smoking status reviewed   ROS: all other systems were reviewed and are negative other than in the HPI   Past Medical History:  Diagnosis Date  . Arthritis   . Diabetes mellitus without complication ([place]) 6387  . Environmental and seasonal allergies   . GERD (gastroesophageal reflux disease)   . Hepatitis C, chronic ([place])    treated  . Hyperlipidemia associated with type 2 diabetes mellitus ([place])   . Hypertension associated with diabetes ([place])   . Myogenic ptosis of left eyelid   . Schizophrenia [facility])     Past Surgical History:  Procedure Laterality Date  . CATARACT EXTRACTION W/ INTRAOCULAR LENS  IMPLANT, BILATERAL    . COLONOSCOPY    . JOINT REPLACEMENT Left    Hip  . PTOSIS REPAIR Left 12/25/2017   Procedure: INTERNAL PTOSIS REPAIR LEFT EYE;  Surgeon: Clista Bernhardt, MD;  Location: [place];  Service: Ophthalmology;  Laterality: Left;  . TONSILLECTOMY      Past medical history, surgical, family, and social history reviewed and updated in the EMR as appropriate.  Objective:  BP 120/64   Pulse 72   Temp 97.9 F (36.6 C) (Oral)   Wt 164 lb (74.4 kg)   SpO2 95%   BMI 32.03 kg/m   Vitals and nursing note reviewed  General: NAD, pleasant, able to participate in exam Cardiac: RRR, normal heart sounds, no  murmurs. 2+ radial and PT pulses bilaterally Respiratory: CTAB, normal effort, No wheezes, rales or rhonchi Abdomen: soft, nontender, nondistended, no hepatic or splenomegaly, +BS Extremities: no edema or cyanosis. WWP. Skin: warm and dry, no rashes noted Neuro: alert and oriented x4, no focal deficits Psych: Normal affect and mood   Assessment & Plan:   Type 2 diabetes mellitus ([place]) Patient A1c from 5/9 was 5.7 down from 7.1 with new regimen of Victoza 1.2 mg and Lantus 20 units. Encourage to continue with dietary modifications and exercise. Will recheck A1c in 3 months. Kidney function is also improved with creatinine 1.55.     Marjie Skiff, MD [place] PGY-2

## 2017-12-29 NOTE — Assessment & Plan Note (Addendum)
Patient A1c from 5/9 was 5.7 down from 7.1 with new regimen of Victoza 1.2 mg and Lantus 20 units. Encourage to continue with dietary modifications and exercise. Will recheck A1c in 3 months. Kidney function is also improved with creatinine 1.18.

## 2018-01-05 ENCOUNTER — Telehealth: Payer: Self-pay | Admitting: Family Medicine
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
## 2018-01-06 NOTE — Telephone Encounter (Signed)
Letter created and sent to provider to approve.  If it is ok to send I will print if off and call patient to come by and pick it up.  It will likely need to be signed.  Jakory Matsuo,CMA

## 2018-01-06 NOTE — Telephone Encounter (Signed)
LM for patient that letter is ready for pick up.  [name] Hartsell,CMA

## 2018-01-14 ENCOUNTER — Ambulatory Visit: Payer: Medicaid Other | Admitting: Podiatry

## 2018-01-21 ENCOUNTER — Ambulatory Visit: Payer: Medicaid Other | Admitting: Podiatry

## 2018-01-21 ENCOUNTER — Encounter: Payer: Self-pay | Admitting: Podiatry

## 2018-01-21 DIAGNOSIS — M7751 Other enthesopathy of right foot: Secondary | ICD-10-CM

## 2018-01-21 DIAGNOSIS — M7752 Other enthesopathy of left foot: Secondary | ICD-10-CM

## 2018-01-25 NOTE — Progress Notes (Signed)
   HPI: 57 year old female presenting today with a chief complaint of pain to the right 2nd toe that began several weeks ago. She has not done anything for treatment. Walking increases the pain. Patient is here for further evaluation and treatment.   Past Medical History:  Diagnosis Date  . Arthritis   . Diabetes mellitus without complication ([place]) 3893  . Environmental and seasonal allergies   . GERD (gastroesophageal reflux disease)   . Hepatitis C, chronic ([place])    treated  . Hyperlipidemia associated with type 2 diabetes mellitus ([place])   . Hypertension associated with diabetes ([place])   . Myogenic ptosis of left eyelid   . Schizophrenia [facility])      Physical Exam: General: The patient is alert and oriented x3 in no acute distress.  Dermatology: Skin is warm, dry and supple bilateral lower extremities. Negative for open lesions or macerations.  Vascular: Palpable pedal pulses bilaterally. No edema or erythema noted. Capillary refill within normal limits.  Neurological: Epicritic and protective threshold grossly intact bilaterally.   Musculoskeletal Exam: Pain with palpation of the right second toe. Range of motion within normal limits to all pedal and ankle joints bilateral. Muscle strength 5/5 in all groups bilateral.   Assessment: 1. Capsulitis right 2nd toe   Plan of Care:  1. Patient evaluated.  2. Injection of 0.5 mLs Celestone Soluspan injected into the right 2nd toe. 3. Recommended good shoe gear.  4. Return to clinic as needed.       Edrick Kins, DPM [facility]  Dr. Edrick Kins, DPM    [address], [place] [address]                Office [phone]  Fax [phone]

## 2018-02-02 ENCOUNTER — Other Ambulatory Visit: Payer: Self-pay | Admitting: Family Medicine

## 2018-02-02 DIAGNOSIS — Z1231 Encounter for screening mammogram for malignant neoplasm of breast: Secondary | ICD-10-CM

## 2018-02-13 ENCOUNTER — Encounter: Payer: Self-pay | Admitting: Allergy & Immunology

## 2018-02-18 ENCOUNTER — Ambulatory Visit: Payer: Medicaid Other | Admitting: Family Medicine

## 2018-03-04 ENCOUNTER — Ambulatory Visit (INDEPENDENT_AMBULATORY_CARE_PROVIDER_SITE_OTHER): Payer: Medicaid Other | Admitting: Family Medicine

## 2018-03-04 ENCOUNTER — Other Ambulatory Visit: Payer: Self-pay

## 2018-03-04 ENCOUNTER — Encounter: Payer: Self-pay | Admitting: Family Medicine

## 2018-03-04 VITALS — BP 132/68 | HR 71 | Temp 98.2°F | Ht 60.0 in | Wt 164.0 lb

## 2018-03-04 DIAGNOSIS — Z794 Long term (current) use of insulin: Secondary | ICD-10-CM

## 2018-03-04 DIAGNOSIS — E1100 Type 2 diabetes mellitus with hyperosmolarity without nonketotic hyperglycemic-hyperosmolar coma (NKHHC): Secondary | ICD-10-CM | POA: Diagnosis not present

## 2018-03-04 LAB — POCT GLYCOSYLATED HEMOGLOBIN (HGB A1C): HBA1C, POC (CONTROLLED DIABETIC RANGE): 6.1 % (ref 0.0–7.0)

## 2018-03-04 MED ORDER — GABAPENTIN 300 MG PO CAPS: 300.0000 mg | ORAL_CAPSULE | Freq: Three times a day (TID) | ORAL | 2 refills | Status: DC

## 2018-03-04 MED ORDER — HYDROCORTISONE 1 % EX OINT: 1.0000 "application " | TOPICAL_OINTMENT | Freq: Two times a day (BID) | CUTANEOUS | 0 refills | Status: DC

## 2018-03-04 NOTE — Assessment & Plan Note (Addendum)
A1c today is 6.1 up from 5.7.  Patient has only been using Lantus and denies using Victoza.  She is still fairly well controlled.  Recommended continue diet control and exercise.  Continue current regimen Lantus 20 units.  Recheck A1c in 3 months.  Normal diabetic foot exam.

## 2018-03-04 NOTE — Progress Notes (Signed)
   Subjective:    Patient ID: Kayla Gregory, female    DOB: 04-20-1961, 57 y.o.   MRN: [id number]   CC: Type 2 diabetes follow-up  HPI: Patient is a 57 year old female who presents today to follow-up on type 2 diabetes management.  Last A1c was 5.7.  Patient reports that she has been running well, she reports eating more bread and noted that she needs to pay attention to her diet.  Patient confessed that she has never used  her Victoza and has only been using Lantus.  Patient continues to check her blood glucose on a daily basis and reports being at goal most days.  She did not bring meter today.  She denies any polyuria, polydipsia.  Smoking status reviewed   ROS: all other systems were reviewed and are negative other than in the HPI   Past Medical History:  Diagnosis Date  . Arthritis   . Diabetes mellitus without complication ([place]) 7048  . Environmental and seasonal allergies   . GERD (gastroesophageal reflux disease)   . Hepatitis C, chronic ([place])    treated  . Hyperlipidemia associated with type 2 diabetes mellitus ([place])   . Hypertension associated with diabetes ([place])   . Myogenic ptosis of left eyelid   . Schizophrenia [facility])     Past Surgical History:  Procedure Laterality Date  . CATARACT EXTRACTION W/ INTRAOCULAR LENS  IMPLANT, BILATERAL    . COLONOSCOPY    . JOINT REPLACEMENT Left    Hip  . PTOSIS REPAIR Left 12/25/2017   Procedure: INTERNAL PTOSIS REPAIR LEFT EYE;  Surgeon: Clista Bernhardt, MD;  Location: [place];  Service: Ophthalmology;  Laterality: Left;  . TONSILLECTOMY      Past medical history, surgical, family, and social history reviewed and updated in the EMR as appropriate.  Objective:  BP 132/68   Pulse 71   Temp 98.2 F (36.8 C) (Oral)   Ht 5' (1.524 m)   Wt 164 lb (74.4 kg)   SpO2 97%   BMI 32.03 kg/m   Vitals and nursing note reviewed  General: NAD, pleasant, able to participate in exam Cardiac: RRR, normal heart sounds, no murmurs. 2+  radial and PT pulses bilaterally Respiratory: CTAB, normal effort, No wheezes, rales or rhonchi Abdomen: soft, nontender, nondistended, no hepatic or splenomegaly, +BS Foot exam bilateral: No deformity, sensation intact Skin: warm and dry, no rashes noted Neuro: alert and oriented x4, no focal deficits Psych: Normal affect and mood   Assessment & Plan:   Type 2 diabetes mellitus (H[facility]) A1c today is 6.1 up from 5.7.  Patient has only been using Lantus and denies using Victoza.  She is still fairly well controlled.  Recommended continue diet control and exercise.  Continue current regimen Lantus 20 units.  Recheck A1c in 3 months.  Normal diabetic foot exam.    Marjie Skiff, MD [place] PGY-3

## 2018-03-11 ENCOUNTER — Ambulatory Visit
Admission: RE | Admit: 2018-03-11 | Discharge: 2018-03-11 | Disposition: A | Discharge status: Home / Self Care | Payer: Medicaid Other | Source: Ambulatory Visit | Attending: Family Medicine | Admitting: Family Medicine

## 2018-03-11 DIAGNOSIS — Z1231 Encounter for screening mammogram for malignant neoplasm of breast: Secondary | ICD-10-CM

## 2018-03-23 ENCOUNTER — Other Ambulatory Visit: Payer: Self-pay | Admitting: Family Medicine

## 2018-03-23 DIAGNOSIS — Z794 Long term (current) use of insulin: Secondary | ICD-10-CM

## 2018-03-23 DIAGNOSIS — E1100 Type 2 diabetes mellitus with hyperosmolarity without nonketotic hyperglycemic-hyperosmolar coma (NKHHC): Secondary | ICD-10-CM

## 2018-03-24 ENCOUNTER — Other Ambulatory Visit: Payer: Self-pay

## 2018-03-24 ENCOUNTER — Other Ambulatory Visit: Payer: Self-pay | Admitting: Family Medicine

## 2018-03-24 DIAGNOSIS — E785 Hyperlipidemia, unspecified: Principal | ICD-10-CM

## 2018-03-24 DIAGNOSIS — E1169 Type 2 diabetes mellitus with other specified complication: Secondary | ICD-10-CM

## 2018-03-24 MED ORDER — ATORVASTATIN CALCIUM 40 MG PO TABS: 40.0000 mg | ORAL_TABLET | Freq: Every day | ORAL | 3 refills | Status: DC

## 2018-04-14 ENCOUNTER — Other Ambulatory Visit: Payer: Self-pay | Admitting: Family Medicine

## 2018-04-14 DIAGNOSIS — E1100 Type 2 diabetes mellitus with hyperosmolarity without nonketotic hyperglycemic-hyperosmolar coma (NKHHC): Secondary | ICD-10-CM

## 2018-04-14 DIAGNOSIS — Z794 Long term (current) use of insulin: Secondary | ICD-10-CM

## 2018-04-24 ENCOUNTER — Emergency Department (HOSPITAL_COMMUNITY)
Admission: EM | Admit: 2018-04-24 | Discharge: 2018-04-24 | Disposition: A | Discharge status: Home / Self Care | Payer: Medicaid Other | Attending: Emergency Medicine | Admitting: Emergency Medicine

## 2018-04-24 ENCOUNTER — Other Ambulatory Visit: Payer: Self-pay

## 2018-04-24 ENCOUNTER — Encounter (HOSPITAL_COMMUNITY): Payer: Self-pay | Admitting: Emergency Medicine

## 2018-04-24 DIAGNOSIS — Z87891 Personal history of nicotine dependence: Secondary | ICD-10-CM | POA: Insufficient documentation

## 2018-04-24 DIAGNOSIS — R600 Localized edema: Secondary | ICD-10-CM | POA: Diagnosis not present

## 2018-04-24 DIAGNOSIS — R2241 Localized swelling, mass and lump, right lower limb: Secondary | ICD-10-CM | POA: Diagnosis present

## 2018-04-24 DIAGNOSIS — Z79899 Other long term (current) drug therapy: Secondary | ICD-10-CM | POA: Insufficient documentation

## 2018-04-24 DIAGNOSIS — Z794 Long term (current) use of insulin: Secondary | ICD-10-CM | POA: Insufficient documentation

## 2018-04-24 DIAGNOSIS — I10 Essential (primary) hypertension: Secondary | ICD-10-CM | POA: Diagnosis not present

## 2018-04-24 DIAGNOSIS — R609 Edema, unspecified: Secondary | ICD-10-CM

## 2018-04-24 DIAGNOSIS — Z7982 Long term (current) use of aspirin: Secondary | ICD-10-CM | POA: Diagnosis not present

## 2018-04-24 DIAGNOSIS — Z96642 Presence of left artificial hip joint: Secondary | ICD-10-CM | POA: Insufficient documentation

## 2018-04-24 DIAGNOSIS — M545 Low back pain: Secondary | ICD-10-CM | POA: Insufficient documentation

## 2018-04-24 DIAGNOSIS — N289 Disorder of kidney and ureter, unspecified: Secondary | ICD-10-CM

## 2018-04-24 DIAGNOSIS — E119 Type 2 diabetes mellitus without complications: Secondary | ICD-10-CM | POA: Diagnosis not present

## 2018-04-24 LAB — BASIC METABOLIC PANEL
ANION GAP: 9 (ref 5–15)
BUN: 21 mg/dL — ABNORMAL HIGH (ref 6–20)
CALCIUM: 9 mg/dL (ref 8.9–10.3)
CO2: 25 mmol/L (ref 22–32)
CREATININE: 1.47 mg/dL — AB (ref 0.44–1.00)
Chloride: 108 mmol/L (ref 98–111)
GFR calc non Af Amer: 38 mL/min — ABNORMAL LOW (ref >60)
GFR, EST AFRICAN AMERICAN: 44 mL/min — AB (ref >60)
Glucose, Bld: 86 mg/dL (ref 70–99)
Potassium: 4.5 mmol/L (ref 3.5–5.1)
SODIUM: 142 mmol/L (ref 135–145)

## 2018-04-24 LAB — URINALYSIS, ROUTINE W REFLEX MICROSCOPIC
BILIRUBIN URINE: NEGATIVE
Bacteria, UA: NONE SEEN
GLUCOSE, UA: NEGATIVE mg/dL
Ketones, ur: NEGATIVE mg/dL
LEUKOCYTES UA: NEGATIVE
NITRITE: NEGATIVE
PH: 6 (ref 5.0–8.0)
Protein, ur: >=300 mg/dL — AB
SPECIFIC GRAVITY, URINE: 1.018 (ref 1.005–1.030)

## 2018-04-24 LAB — CBC WITH DIFFERENTIAL/PLATELET
ABS IMMATURE GRANULOCYTES: 0 10*3/uL (ref 0.0–0.1)
BASOS ABS: 0 10*3/uL (ref 0.0–0.1)
BASOS PCT: 0 %
EOS ABS: 0.1 10*3/uL (ref 0.0–0.7)
Eosinophils Relative: 1 %
HCT: 37.6 % (ref 36.0–46.0)
Hemoglobin: 12.7 g/dL (ref 12.0–15.0)
Immature Granulocytes: 0 %
Lymphocytes Relative: 47 %
Lymphs Abs: 2.3 10*3/uL (ref 0.7–4.0)
MCH: 30.8 pg (ref 26.0–34.0)
MCHC: 33.8 g/dL (ref 30.0–36.0)
MCV: 91 fL (ref 78.0–100.0)
Monocytes Absolute: 0.4 10*3/uL (ref 0.1–1.0)
Monocytes Relative: 9 %
NEUTROS ABS: 2.2 10*3/uL (ref 1.7–7.7)
NEUTROS PCT: 43 %
PLATELETS: 199 10*3/uL (ref 150–400)
RBC: 4.13 MIL/uL (ref 3.87–5.11)
RDW: 12.3 % (ref 11.5–15.5)
WBC: 5 10*3/uL (ref 4.0–10.5)

## 2018-04-24 LAB — BRAIN NATRIURETIC PEPTIDE: B Natriuretic Peptide: 48.5 pg/mL (ref 0.0–100.0)

## 2018-04-24 MED ORDER — FUROSEMIDE 20 MG PO TABS
40.0000 mg | ORAL_TABLET | Freq: Once | ORAL | Status: AC
  Administered 2018-04-24: 40 mg via ORAL
  Filled 2018-04-24: qty 2

## 2018-04-24 MED ORDER — FUROSEMIDE 20 MG PO TABS: 20.0000 mg | ORAL_TABLET | Freq: Every day | ORAL | 0 refills | Status: DC

## 2018-04-24 NOTE — ED Notes (Signed)
ED Provider at bedside. 

## 2018-04-24 NOTE — ED Notes (Signed)
Pt ambulatory to the restroom without assistance.  
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
## 2018-04-24 NOTE — ED Triage Notes (Addendum)
Lower baCK PAIIN AND RT ankle  SWELLING AND PAINFUL TO WALK SINCE  Last sat,  They took her off her water pill and she is worried

## 2018-04-24 NOTE — Discharge Instructions (Signed)
Take lasix 20 mg daily for 3 days to help you with your leg swelling.   Keep leg elevated to help with swelling.   You have an appointment with family practice next week. If they don't contact you tomorrow, call the office regarding the appointment time and date. They will recheck your kidney function next week   Return to ER if you have worse back pain, chest pain, leg swelling, shortness of breath.

## 2018-04-29 ENCOUNTER — Encounter: Payer: Self-pay | Admitting: Family Medicine

## 2018-04-29 ENCOUNTER — Ambulatory Visit (INDEPENDENT_AMBULATORY_CARE_PROVIDER_SITE_OTHER): Payer: Medicaid Other | Admitting: Family Medicine

## 2018-04-29 ENCOUNTER — Other Ambulatory Visit: Payer: Self-pay

## 2018-04-29 VITALS — BP 146/62 | HR 73 | Temp 97.6°F | Ht 60.0 in | Wt 166.0 lb

## 2018-04-29 DIAGNOSIS — R609 Edema, unspecified: Secondary | ICD-10-CM

## 2018-04-29 MED ORDER — VALSARTAN-HYDROCHLOROTHIAZIDE 160-12.5 MG PO TABS: 1.0000 | ORAL_TABLET | Freq: Every day | ORAL | 0 refills | Status: DC

## 2018-04-29 NOTE — Progress Notes (Signed)
   [facility] Phone: [phone]   cc: leg swelling  Subjective:  Edema: patient has had edema over the past two weeks and went to the ED for treatment of it on 9/6.  She was given lasix and three days of lasix to go home with.  She states it helped her some, but has not completely resolved.  The swelling is right sided more than left sided and she says the right side hurts on the top of her foot, but does not hurt up her calf or behind her knees.  She gets up to pee 4-5 times a night, but this is not new for her.  She used to take losartan-HCTZ but stopped it due to a rash and attributed this to a 'new color pill' when she switched from [facility] to [facility].  She is uncertain of the exact time she stopped taking this medicine.  She still takes amlodipine for blood pressure.    ROS: See HPI for pertinent positives and negatives  Objective: BP (!) 146/62   Pulse 73   Temp 97.6 F (36.4 C) (Oral)   Ht 5' (1.524 m)   Wt 166 lb (75.3 kg)   SpO2 97%   BMI 32.42 kg/m  Gen: NAD, alert and oriented, cooperative with exam HEENT: NCAT CV: normal rate, regular rhythm. No murmurs, no rubs.  Resp: LCTAB, no wheezes, crackles. normal work of breathing GI: nontender to palpation, BS present, no guarding or organomegaly. No RUQ tenderness, no ascites.  Extremities: nonpitting edema that affects the right side more than the left side.  Tender to palpation on the top of the right foot, but not other places.  Swelling goes approximately midway up the calves. No tenderness behind the knees.   Skin: hyperpigmentation on the arms from previous rash.   Psych: Appropriate behavior  Assessment/Plan: Non-pitting edema Patient endorses two week history of bilateral leg swelling with right greater than left, along with tenderness to palpation of the dorsum of the right foot.  Was given 4 days lasix from the ED on 9/6, which she states helped some.  On exam she had non-pitting edema that  was more significant on the right side than left side.  Recent bmp and u/a showed protein in urine, Cr ~ 1.5.  BNP was < 50.  Concern is for kidney disease such as CKD or nephrotic syndrome causing this swelling given normal BNP, and no evidence of liver disease.  CMP was ordered to look at albumin and LFTs.  U/A and Pr:Cr ratio was ordered to look for proteinuria.  Will refer to nephrologist if these are concerning for nephrotic disease.  Patient was previously on losartan-HCTZ but taken off due to rash, but had previously been on it for several years.  Prescribed valsartan-HCTZ 160-12.5 qdaily and told patient to d/c if rash returns.  If patient tolerates valsartan will consider discontinuing the amlodipine as this is also a possible cause of swelling. Obvious non-pitting nature of her edema is concerning for other non-renal causes and would need to be worked up in the future if it persists.     Clemetine Marker, MD PGY-1

## 2018-04-29 NOTE — Patient Instructions (Signed)
It was nice to meet you today,   We did some lab work today to help [place] determine what is causing your swelling in your legs.  This will tell us if there is a problem with your kidneys or your liver.  We will call you with the results when we get them.    I prescribed you a medicine that is similar to the losartan you stopped taking a few months ago, which shouldn't give you a rash the same way the losartan did, but if you notice a rash please stop taking it and let us know.    We did not prescribe you any more lasix because of the risk of drying you out too much which can hurt your other organs.  The valsartan-HCTZ pill we just prescribed can have similar, but not as strong, effects to the lasix, so that should help with your swelling some.   We will let you know what the results say when we get them and then decide what our next step will be.     Clemetine Marker, MD

## 2018-04-29 NOTE — Assessment & Plan Note (Signed)
Patient endorses two week history of bilateral leg swelling with right greater than left, along with tenderness to palpation of the dorsum of the right foot.  Was given 4 days lasix from the ED on 9/6, which she states helped some.  On exam she had non-pitting edema that was more significant on the right side than left side.  Recent bmp and u/a showed protein in urine, Cr ~ 1.5.  BNP was < 50.  Concern is for kidney disease such as CKD or nephrotic syndrome causing this swelling given normal BNP, and no evidence of liver disease.  CMP was ordered to look at albumin and LFTs.  U/A and Pr:Cr ratio was ordered to look for proteinuria.  Will refer to nephrologist if these are concerning for nephrotic disease.  Patient was previously on losartan-HCTZ but taken off due to rash, but had previously been on it for several years.  Prescribed valsartan-HCTZ 160-12.5 qdaily and told patient to d/c if rash returns.  If patient tolerates valsartan will consider discontinuing the amlodipine as this is also a possible cause of swelling. Obvious non-pitting nature of her edema is concerning for other non-renal causes and would need to be worked up in the future if it persists.

## 2018-04-30 LAB — COMPREHENSIVE METABOLIC PANEL
ALBUMIN: 4 g/dL (ref 3.5–5.5)
ALT: 28 IU/L (ref 0–32)
AST: 35 IU/L (ref 0–40)
Albumin/Globulin Ratio: 1.3 (ref 1.2–2.2)
Alkaline Phosphatase: 131 IU/L — ABNORMAL HIGH (ref 39–117)
BILIRUBIN TOTAL: 0.3 mg/dL (ref 0.0–1.2)
BUN / CREAT RATIO: 17 (ref 9–23)
BUN: 23 mg/dL (ref 6–24)
CHLORIDE: 104 mmol/L (ref 96–106)
CO2: 24 mmol/L (ref 20–29)
Calcium: 9.5 mg/dL (ref 8.7–10.2)
Creatinine, Ser: 1.34 mg/dL — ABNORMAL HIGH (ref 0.57–1.00)
GFR calc Af Amer: 50 mL/min/{1.73_m2} — ABNORMAL LOW (ref >59)
GFR calc non Af Amer: 43 mL/min/{1.73_m2} — ABNORMAL LOW (ref >59)
GLOBULIN, TOTAL: 3.1 g/dL (ref 1.5–4.5)
GLUCOSE: 92 mg/dL (ref 65–99)
Potassium: 3.9 mmol/L (ref 3.5–5.2)
SODIUM: 142 mmol/L (ref 134–144)
Total Protein: 7.1 g/dL (ref 6.0–8.5)

## 2018-04-30 LAB — POCT URINALYSIS DIP (MANUAL ENTRY)
BILIRUBIN UA: NEGATIVE mg/dL
Bilirubin, UA: NEGATIVE
Glucose, UA: 100 mg/dL — AB
Leukocytes, UA: NEGATIVE
Nitrite, UA: NEGATIVE
Urobilinogen, UA: 1 E.U./dL
pH, UA: 5.5 (ref 5.0–8.0)

## 2018-05-01 ENCOUNTER — Telehealth: Payer: Self-pay | Admitting: Family Medicine

## 2018-05-01 DIAGNOSIS — R609 Edema, unspecified: Secondary | ICD-10-CM

## 2018-05-01 LAB — PROTEIN / CREATININE RATIO, URINE
Creatinine, Urine: 161.1 mg/dL
PROTEIN/CREAT RATIO: 6283 mg/g{creat} — AB (ref 0–200)
Protein, Ur: 1012.2 mg/dL

## 2018-05-01 NOTE — Telephone Encounter (Signed)
Echo scheduled for 9/20 @8am  at [facility]. Pt contacted and made aware.

## 2018-05-01 NOTE — Telephone Encounter (Signed)
Called patient to inform her of her lab results (highly elevated Pr:Cr ratio and hemoglobinuria) and that we would like to refer her to nephrology to work up the cause of her proteinuria.  Also informed patient we would like to schedule an echocardiogram to view her heart to rule out cardiac causes of her edema.  Patient states her edema is improving somewhat now that she has taken her valsartan-hctz pills.  Patient agreeable to this plan.

## 2018-05-08 ENCOUNTER — Ambulatory Visit (HOSPITAL_COMMUNITY)
Admission: RE | Admit: 2018-05-08 | Discharge: 2018-05-08 | Disposition: A | Discharge status: Home / Self Care | Payer: Medicaid Other | Source: Ambulatory Visit | Attending: Family Medicine | Admitting: Family Medicine

## 2018-05-08 DIAGNOSIS — R609 Edema, unspecified: Secondary | ICD-10-CM

## 2018-05-08 DIAGNOSIS — R069 Unspecified abnormalities of breathing: Secondary | ICD-10-CM | POA: Diagnosis present

## 2018-05-08 DIAGNOSIS — E119 Type 2 diabetes mellitus without complications: Secondary | ICD-10-CM | POA: Insufficient documentation

## 2018-05-08 DIAGNOSIS — I071 Rheumatic tricuspid insufficiency: Secondary | ICD-10-CM | POA: Insufficient documentation

## 2018-05-08 DIAGNOSIS — E785 Hyperlipidemia, unspecified: Secondary | ICD-10-CM | POA: Diagnosis not present

## 2018-05-08 DIAGNOSIS — I119 Hypertensive heart disease without heart failure: Secondary | ICD-10-CM | POA: Diagnosis not present

## 2018-05-08 NOTE — Progress Notes (Signed)
  Echocardiogram 2D Echocardiogram has been performed.  Jennette Dubin 05/08/2018, 8:48 AM

## 2018-05-11 ENCOUNTER — Telehealth: Payer: Self-pay | Admitting: Family Medicine

## 2018-05-11 NOTE — Telephone Encounter (Signed)
Called patient to tell her the results of her echo.  Informed patient I don't believe that her heart is the cause of her swelling.  She states her foot is still swollen and she believes it is due to a nerve issue.  I asked patient about her nephrology referral and she told me she has not heard from them. I told her it is more important to address her proteinuria/hematuria than to see the podiatrist about her foot and that I think the swelling could be related to her kidney. Told patient to call the clinic and inform jackie she has not heard from nephrology.

## 2018-05-11 NOTE — Telephone Encounter (Signed)
Pt called and would like to have the results of her heart tests and also would like a referral to go see Dr. [facility] for her foot being swollen. Please call patient today. jw

## 2018-05-11 NOTE — Telephone Encounter (Signed)
Will forward to Dr. Jeannine Kitten who ordered this imaging for patient. [name] Hartsell,CMA

## 2018-05-22 IMAGING — CR DG ELBOW COMPLETE 3+V*R*
4 series · 4 of 4 positions shown · non-contrast
Comparison: None.

CLINICAL DATA: 56 y/o  F; fall with right posterior elbow pain.

EXAM:
RIGHT ELBOW - COMPLETE 3+ VIEW

[elbow ap]
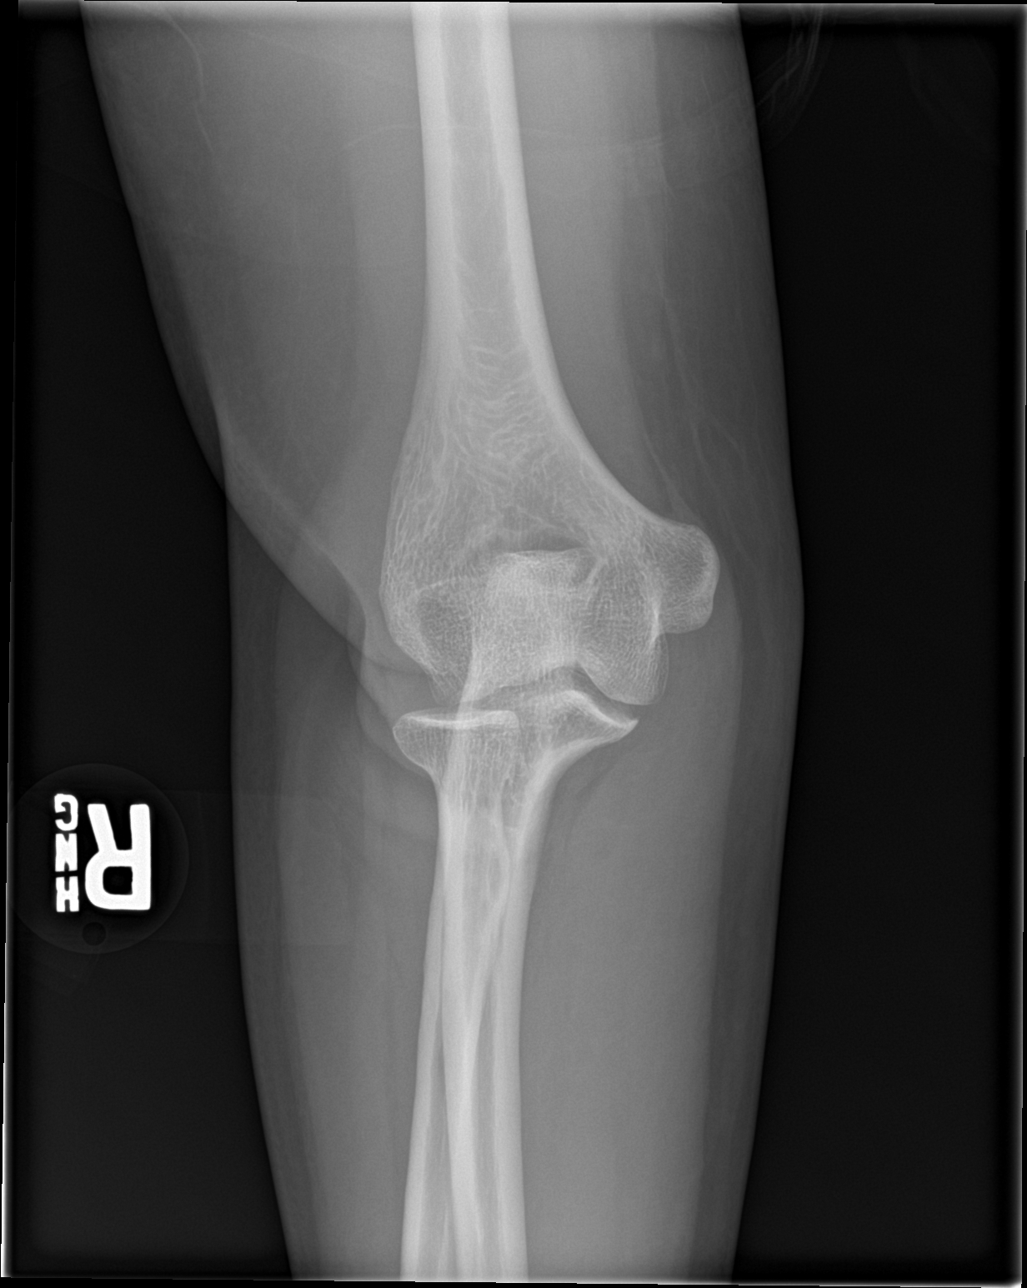

[elbow obl (1 of 2)]
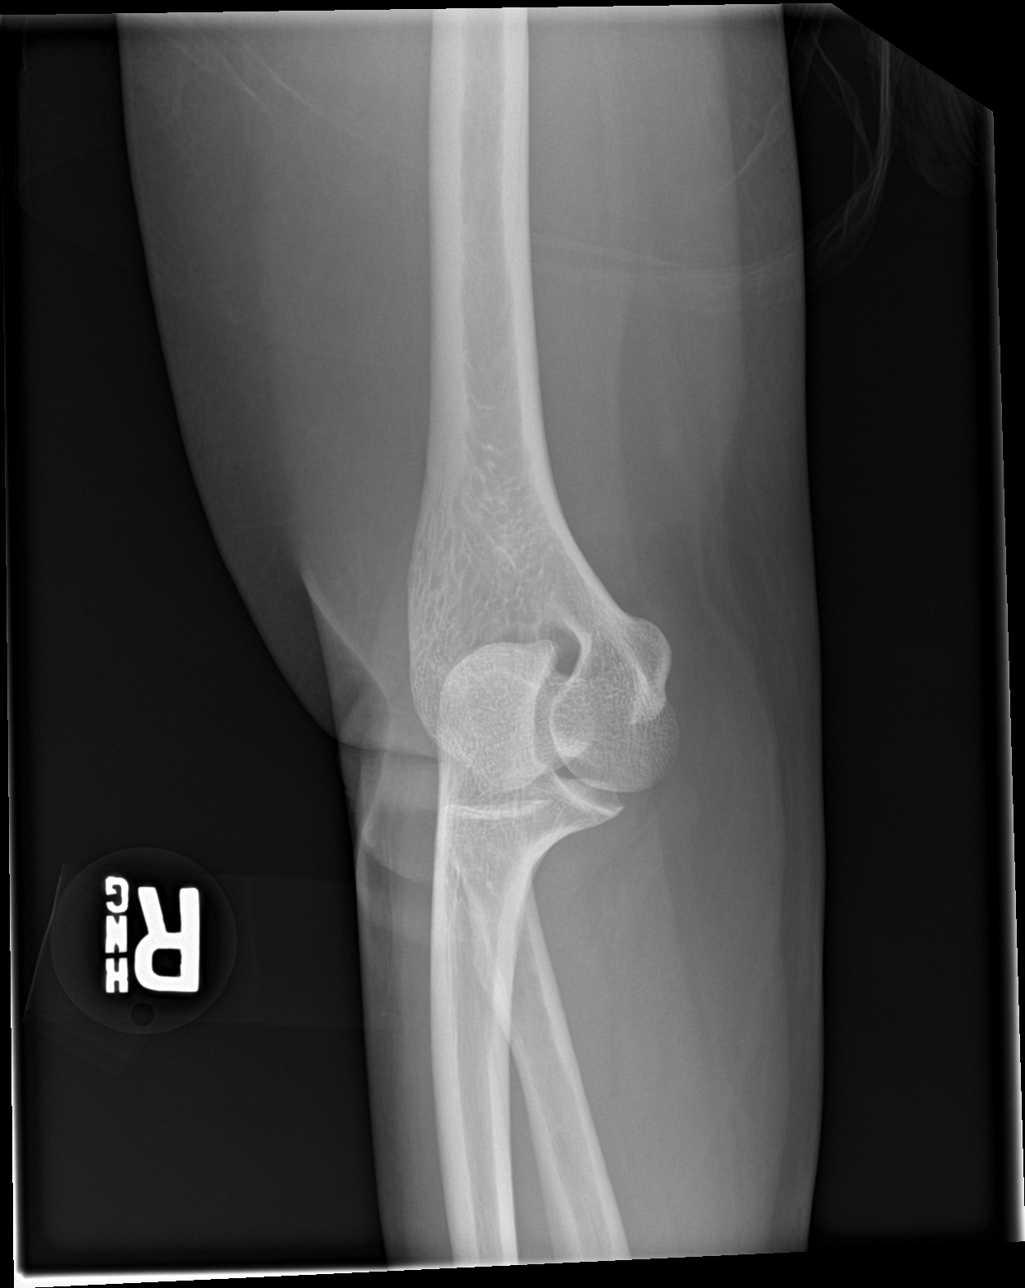

[elbow obl (2 of 2)]
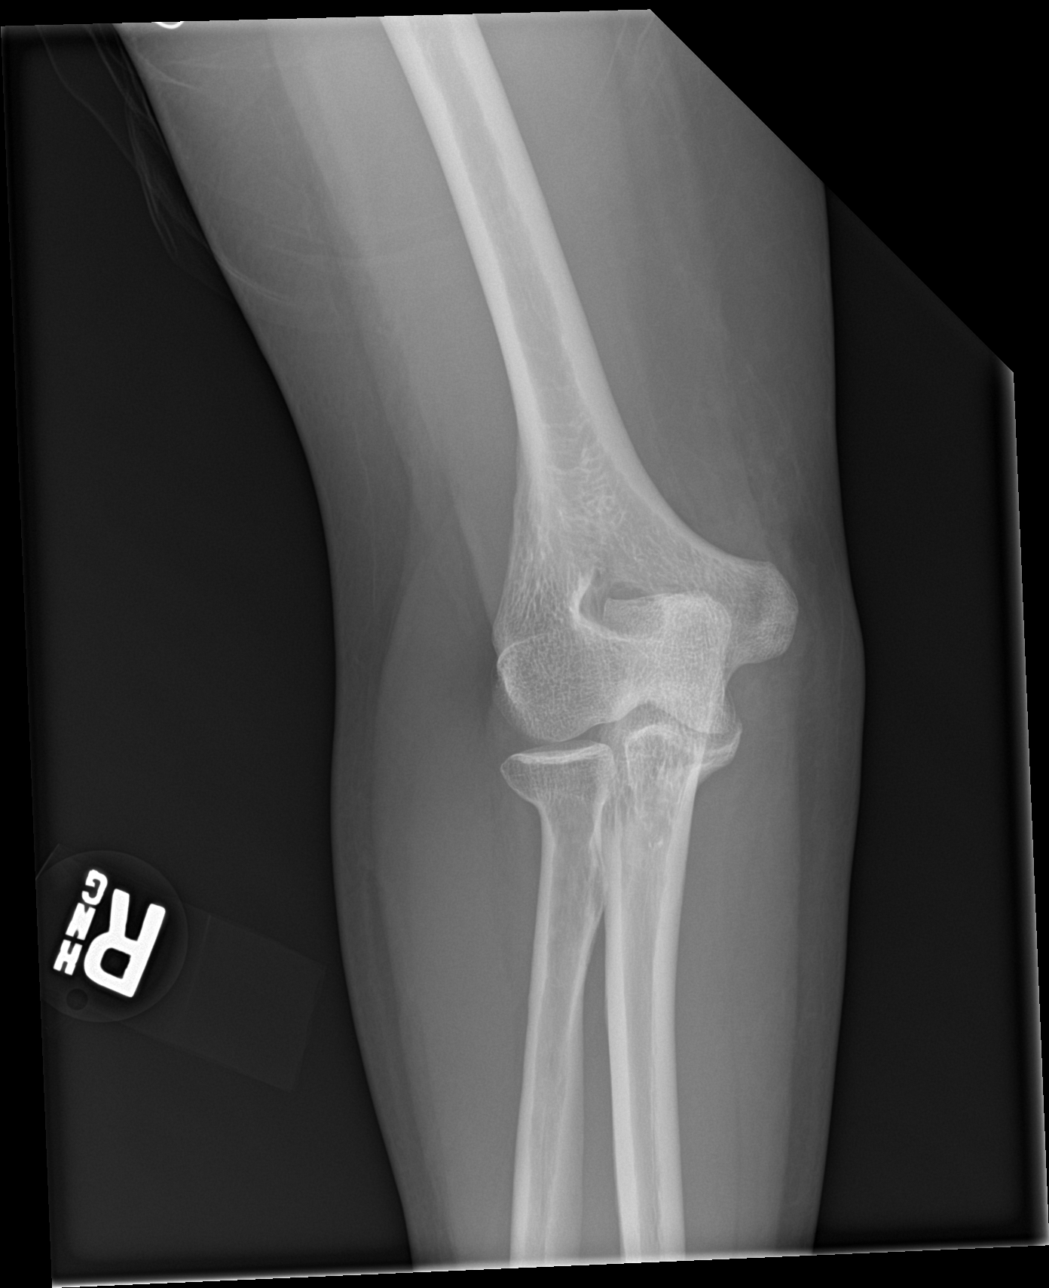

[elbow lat]
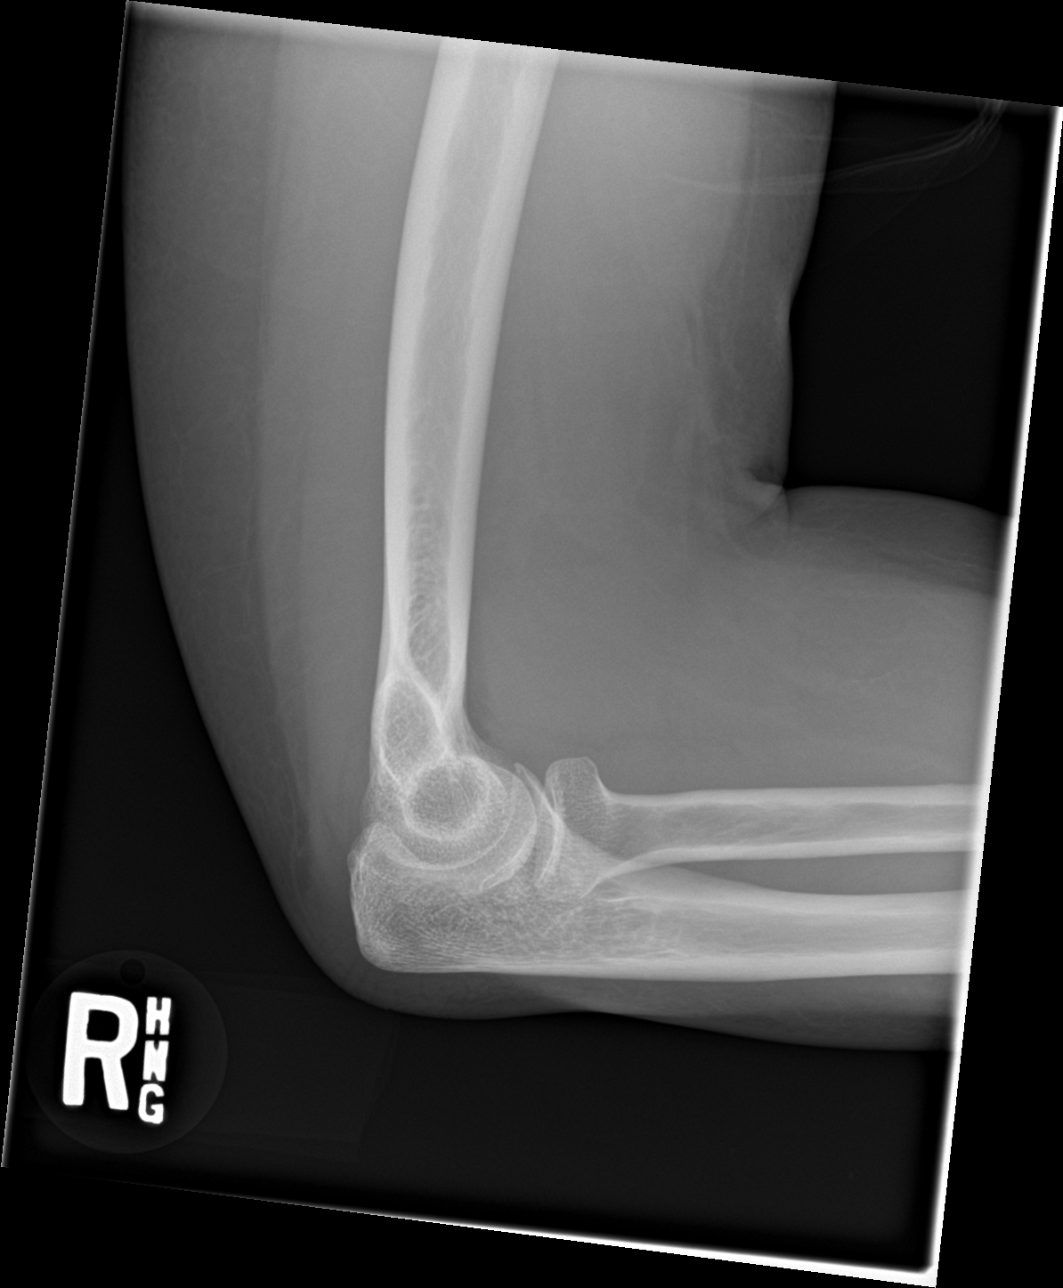

[4 of 4 positions shown; findings below may reference images not displayed]

FINDINGS: There is no evidence of fracture, dislocation, or joint effusion.
There is no evidence of arthropathy or other focal bone abnormality.
Soft tissues are unremarkable.
IMPRESSION: No acute fracture or dislocation identified.

By: Kaneza Alice Giforiyo M.D.

## 2018-05-22 IMAGING — CR DG SHOULDER 2+V*R*
3 series · 3 of 3 positions shown · non-contrast
Comparison: None.

CLINICAL DATA: 56 y/o  F; fall with shoulder pain.

EXAM:
RIGHT SHOULDER - 2+ VIEW

[shoulder grashey]
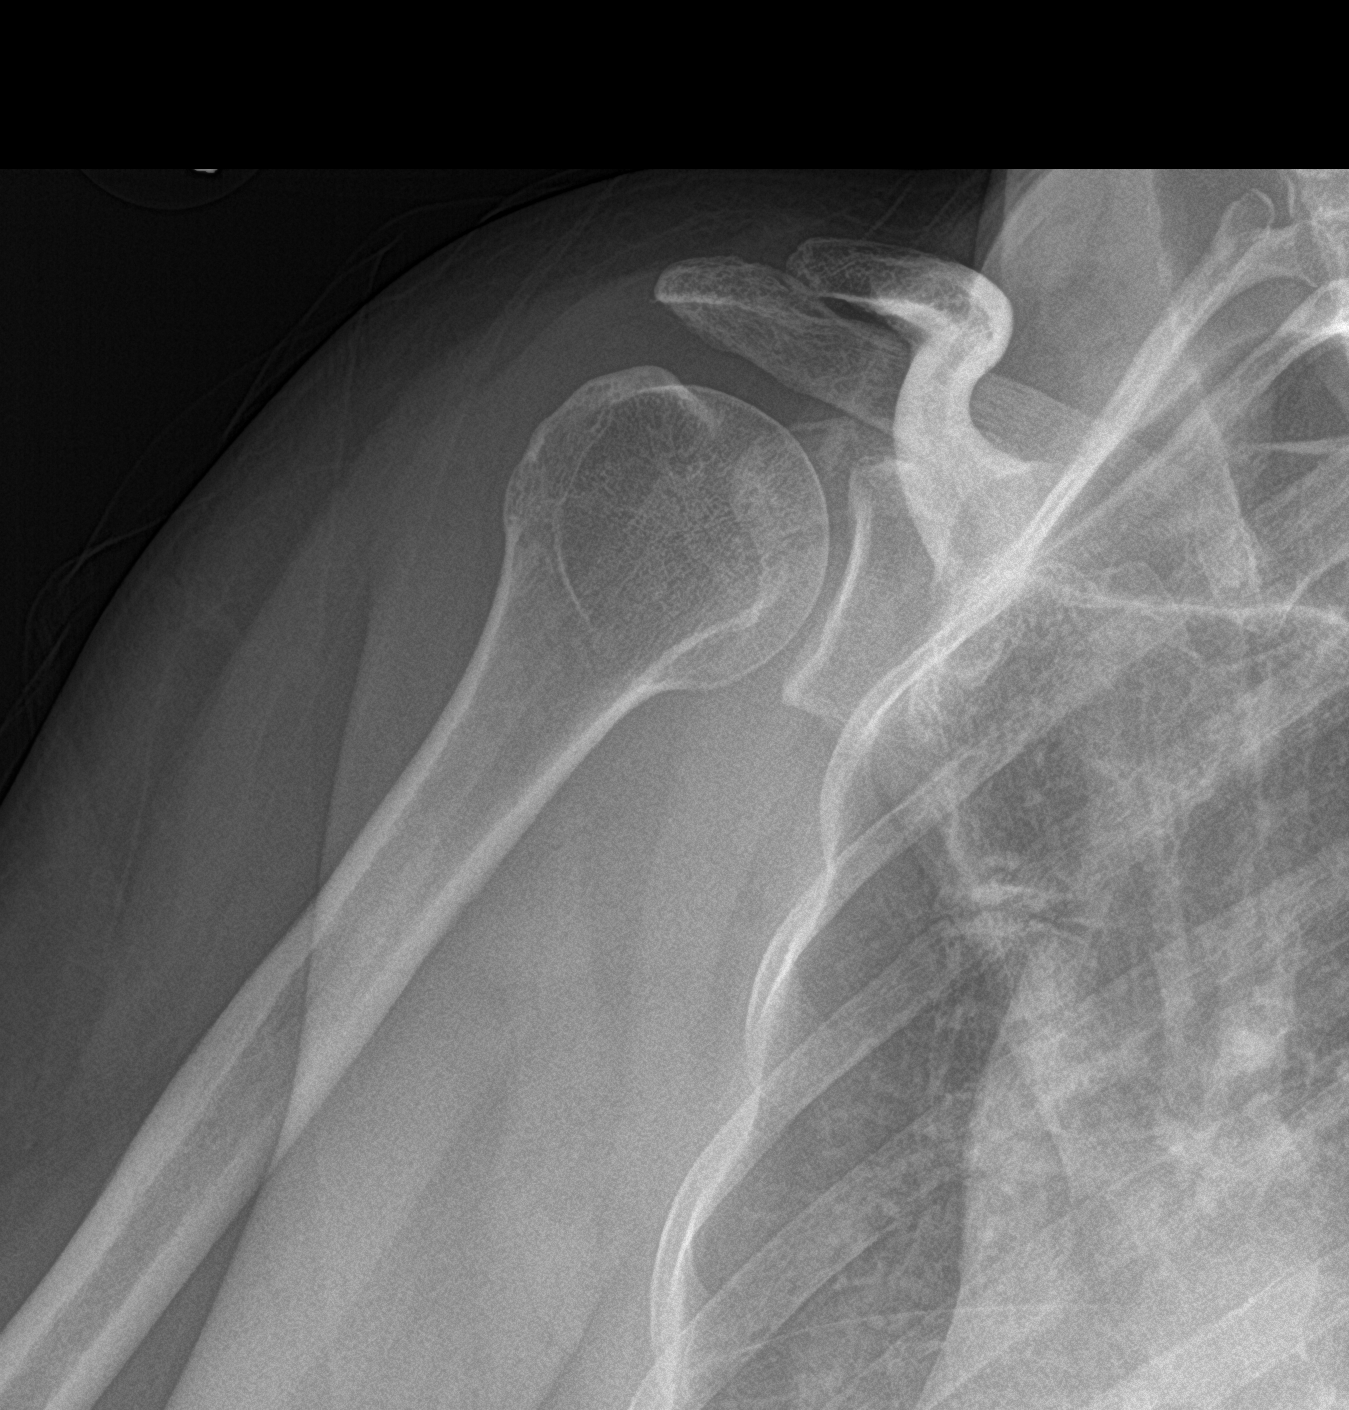

[shoulder y view]
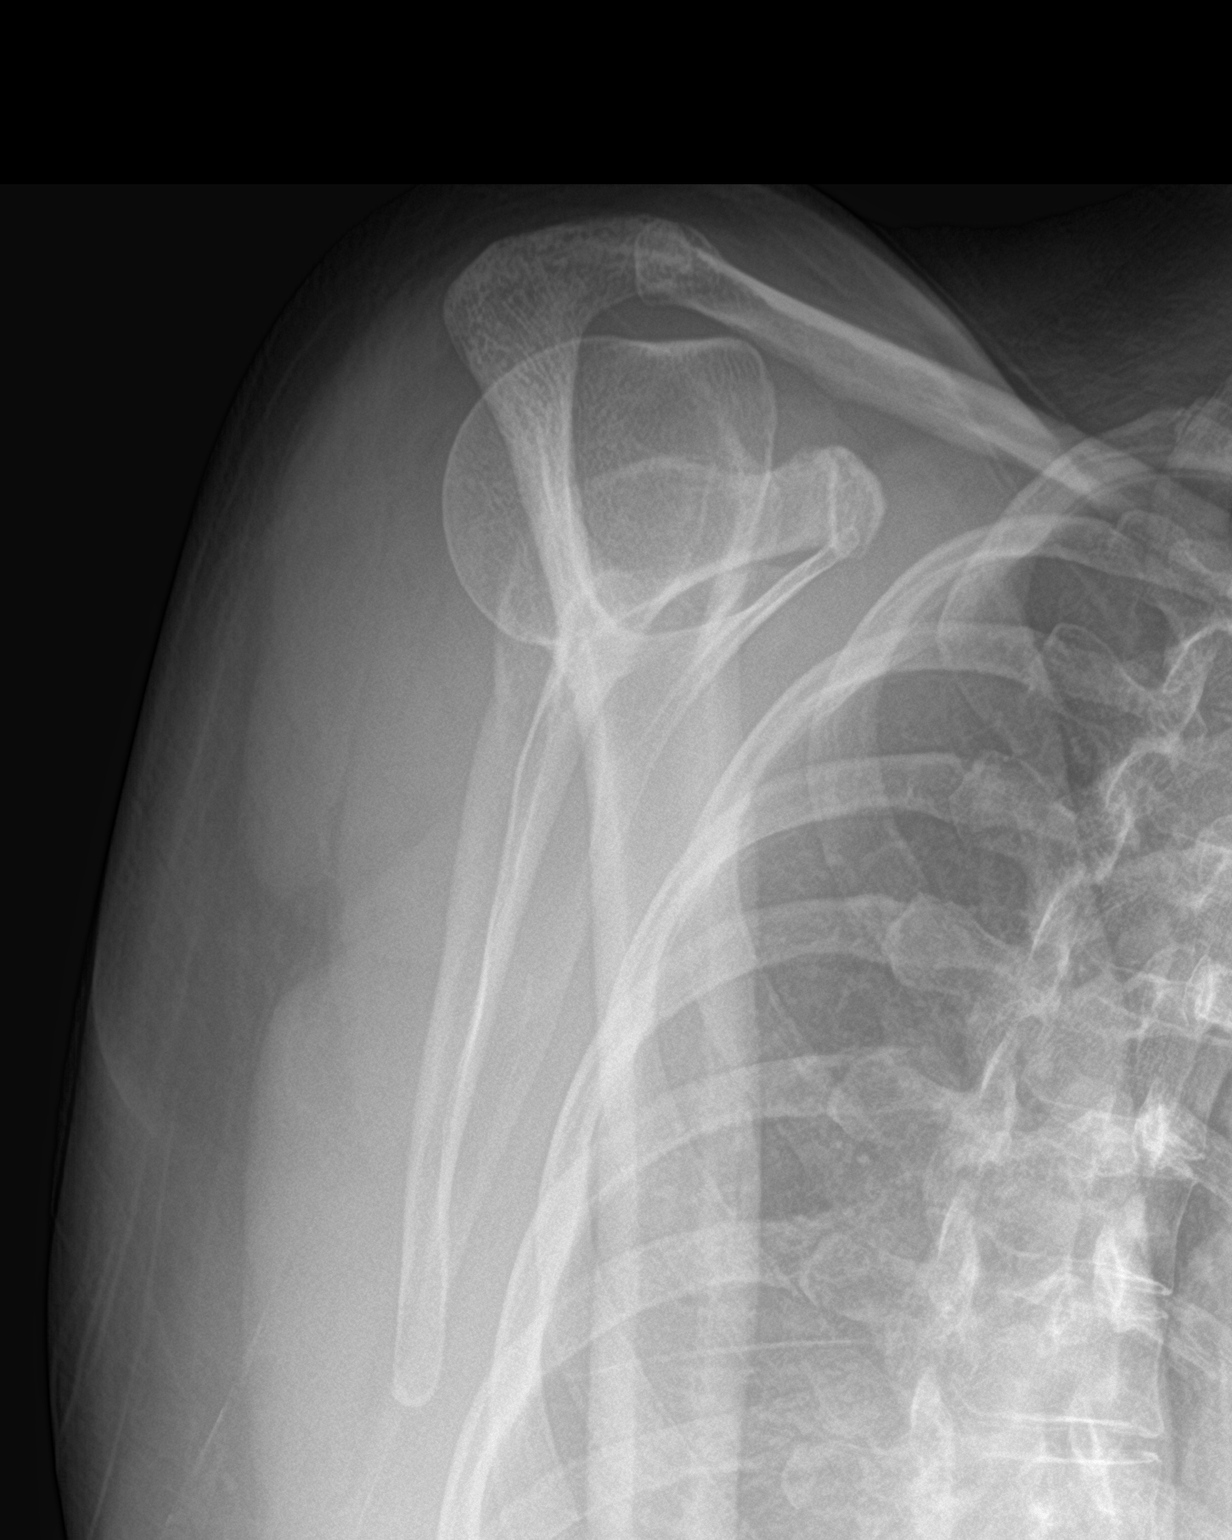

[shoulder axillary]
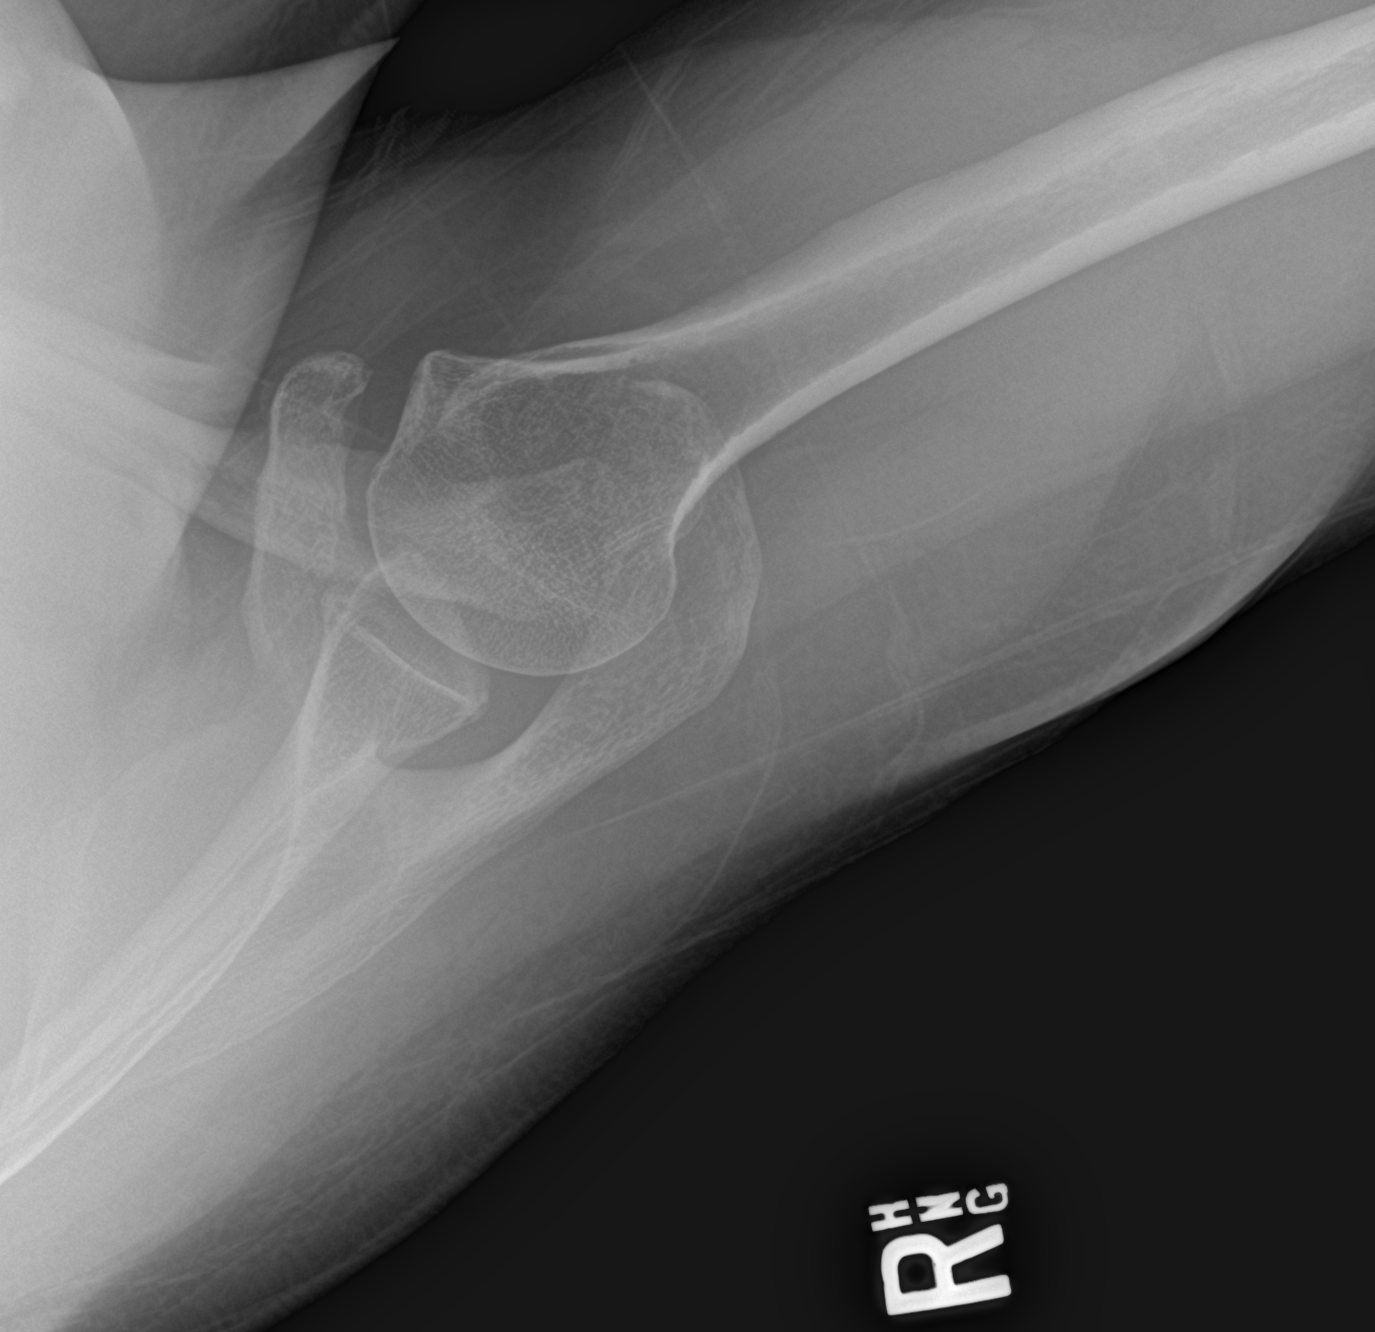

[3 of 3 positions shown; findings below may reference images not displayed]

FINDINGS: There is no evidence of fracture or dislocation. There is no
evidence of arthropathy or other focal bone abnormality. Soft
tissues are unremarkable.
IMPRESSION: No acute fracture or dislocation identified.

By: Obdulia Heisler M.D.

## 2018-05-22 IMAGING — CR DG WRIST COMPLETE 3+V*R*
4 series · 4 of 4 positions shown · non-contrast
Comparison: None.

CLINICAL DATA: 56 y/o F; status post fall with right wrist pain.
History of right wrist fracture.

EXAM:
RIGHT WRIST - COMPLETE 3+ VIEW

[wrist pa]
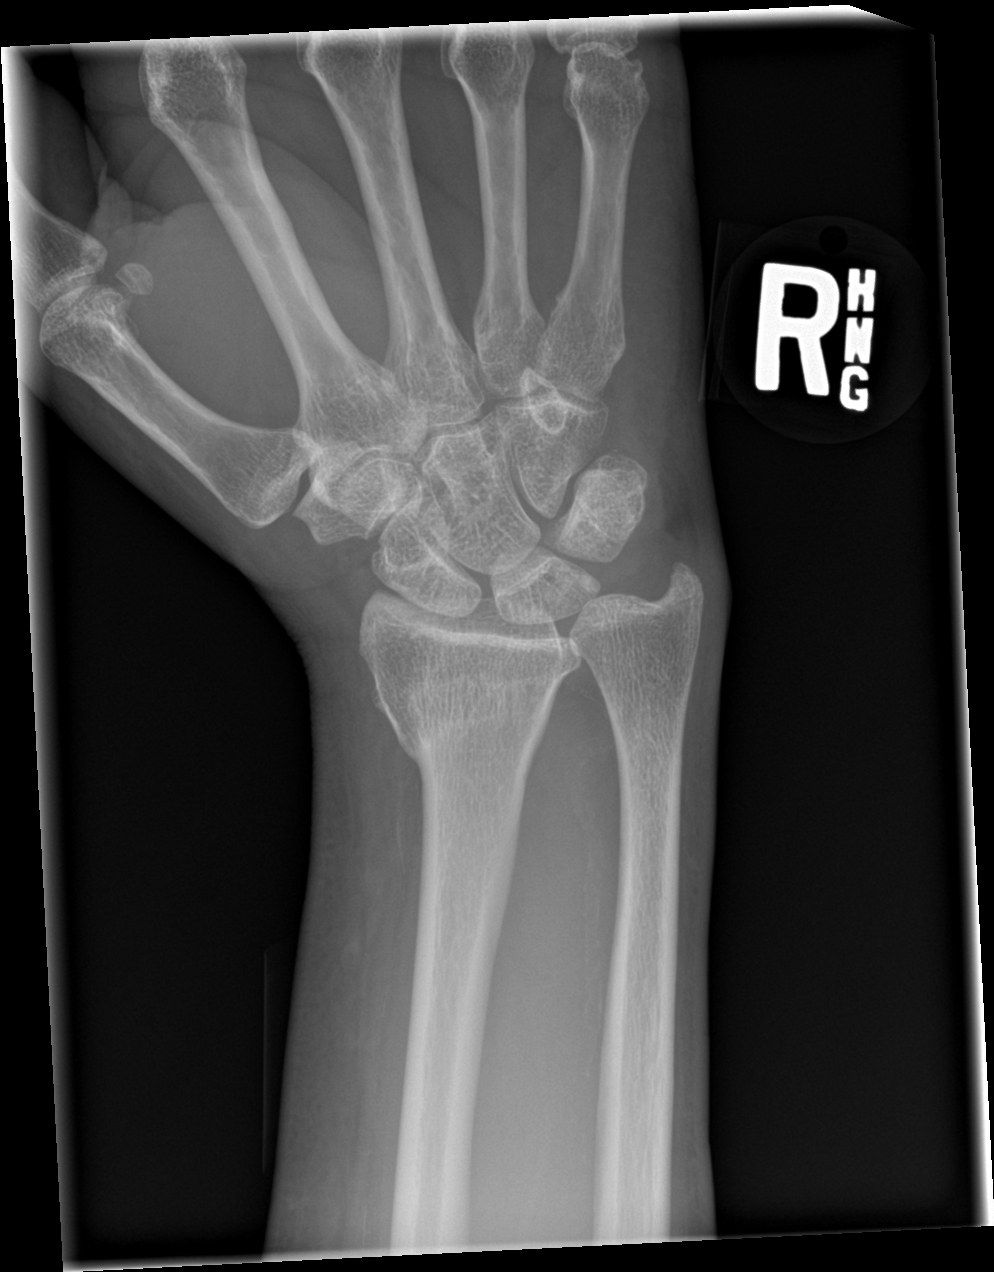

[wrist obl]
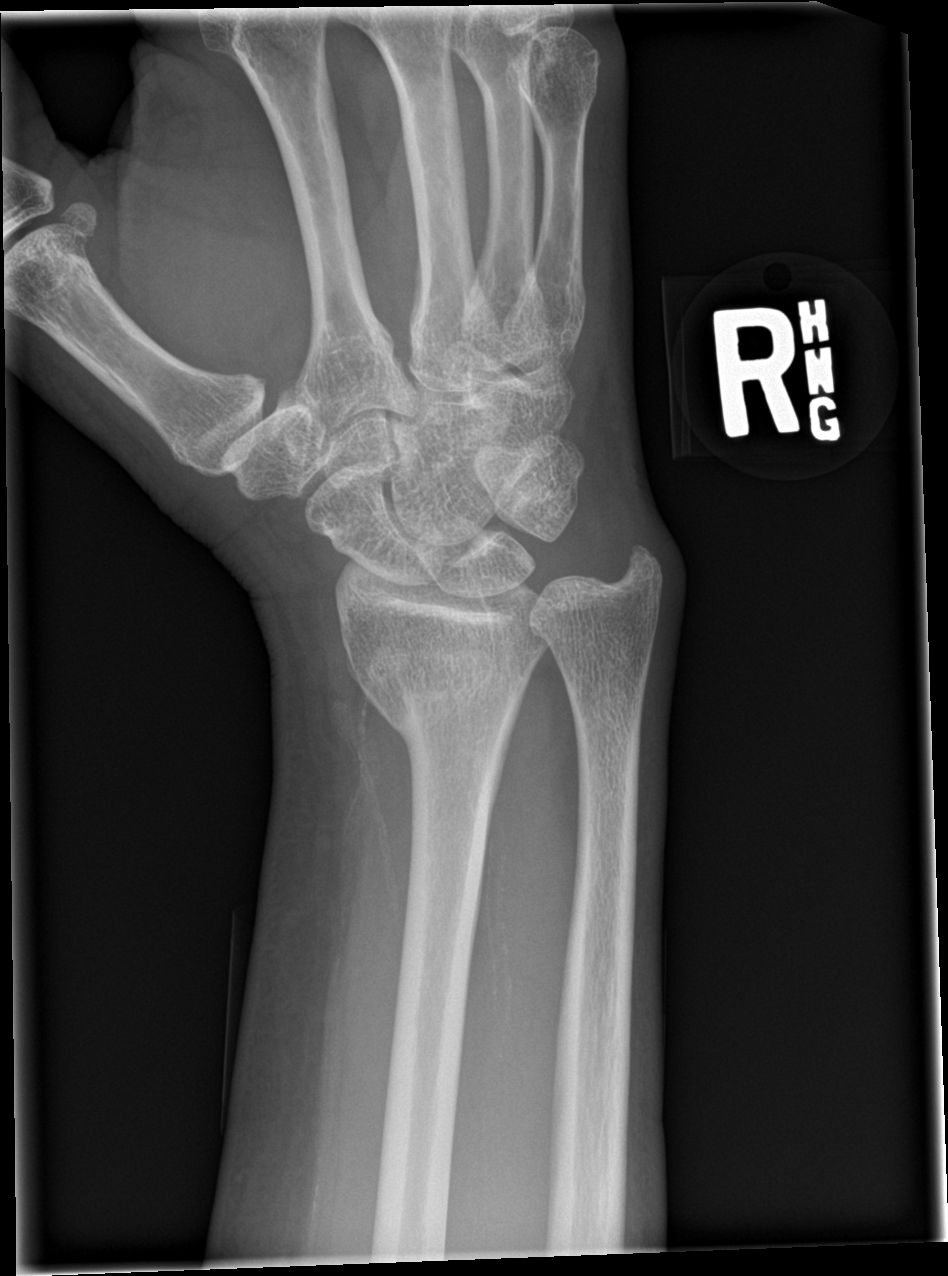

[wrist lat]
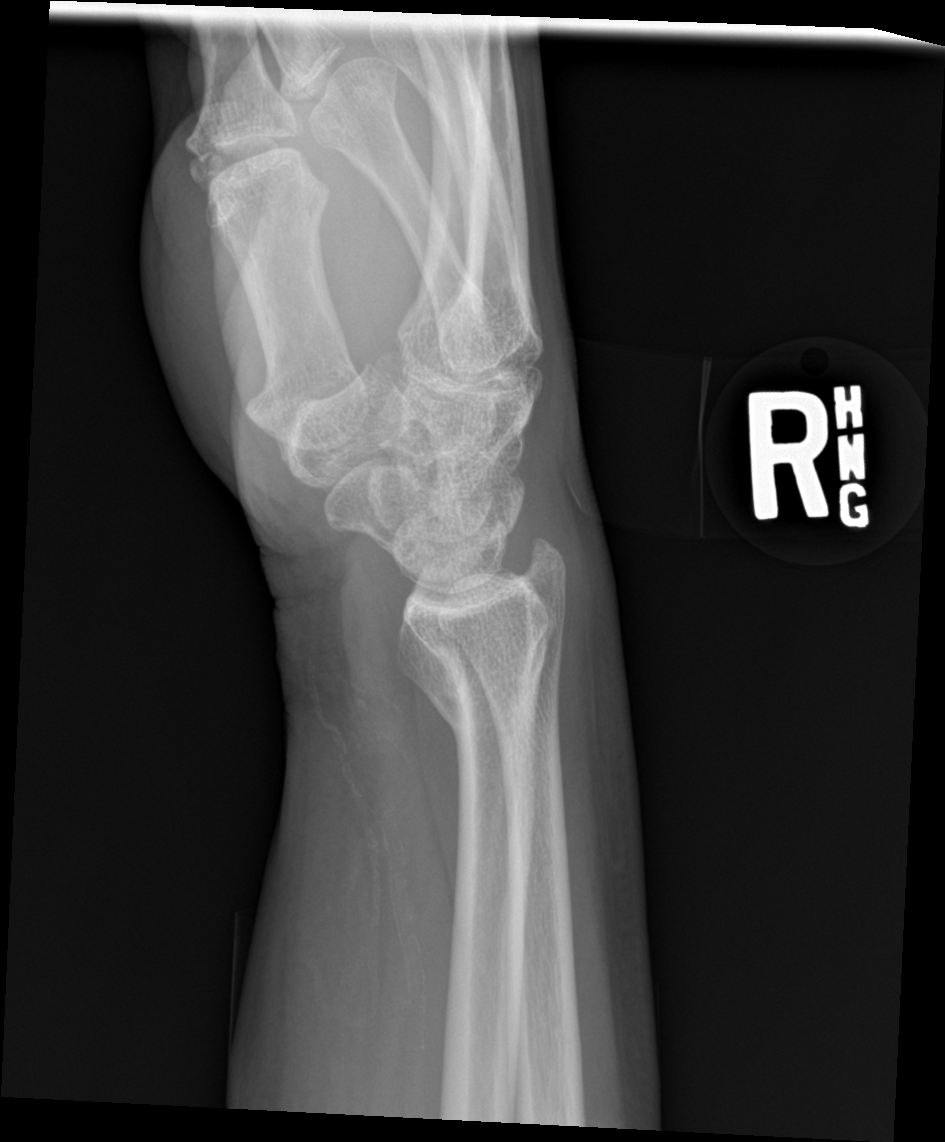

[wrist navicular]
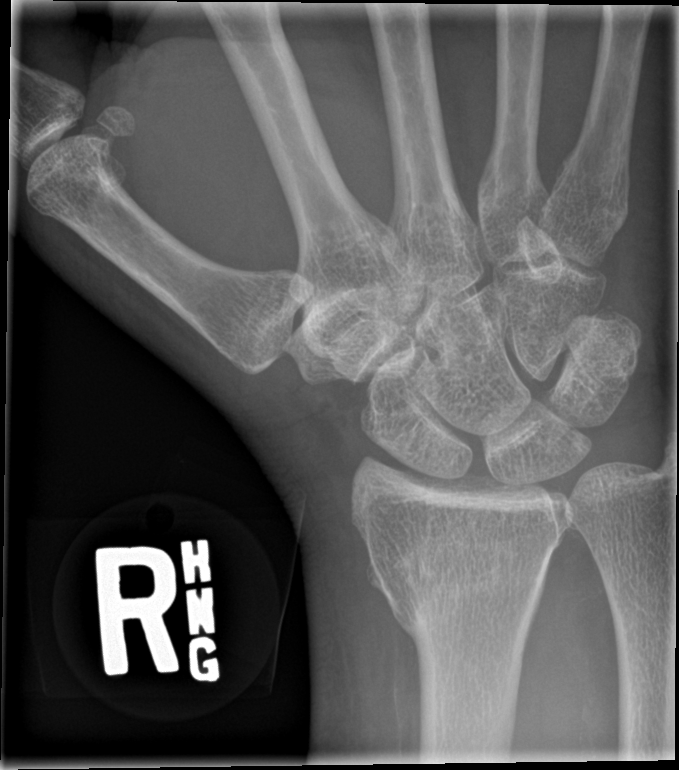

[4 of 4 positions shown; findings below may reference images not displayed]

FINDINGS: No acute fracture or dislocation identified. Chronic fracture
deformity of the distal radius. Small vessel calcifications.
IMPRESSION: No acute fracture or dislocation identified. Chronic fracture
deformity of the distal radius.

By: Hirochika Aragaki M.D.

## 2018-06-02 ENCOUNTER — Other Ambulatory Visit: Payer: Self-pay | Admitting: Family Medicine

## 2018-06-05 ENCOUNTER — Other Ambulatory Visit: Payer: Self-pay | Admitting: Internal Medicine

## 2018-06-05 DIAGNOSIS — N183 Chronic kidney disease, stage 3 unspecified: Secondary | ICD-10-CM

## 2018-06-09 ENCOUNTER — Encounter: Payer: Self-pay | Admitting: Family Medicine

## 2018-06-09 ENCOUNTER — Ambulatory Visit (INDEPENDENT_AMBULATORY_CARE_PROVIDER_SITE_OTHER): Payer: Medicaid Other | Admitting: Family Medicine

## 2018-06-09 ENCOUNTER — Other Ambulatory Visit: Payer: Self-pay

## 2018-06-09 VITALS — BP 130/78 | HR 69 | Temp 98.0°F | Ht 60.0 in | Wt 167.0 lb

## 2018-06-09 DIAGNOSIS — Z794 Long term (current) use of insulin: Secondary | ICD-10-CM | POA: Diagnosis not present

## 2018-06-09 DIAGNOSIS — E1100 Type 2 diabetes mellitus with hyperosmolarity without nonketotic hyperglycemic-hyperosmolar coma (NKHHC): Secondary | ICD-10-CM

## 2018-06-09 LAB — POCT GLYCOSYLATED HEMOGLOBIN (HGB A1C): HBA1C, POC (CONTROLLED DIABETIC RANGE): 6.7 % (ref 0.0–7.0)

## 2018-06-09 NOTE — Progress Notes (Signed)
Subjective:    Patient ID: Kayla Gregory, female    DOB: 1960/09/07, 57 y.o.   MRN: [id number]   CC: Diabetes management follow-up  HPI: Patient is a 57 yo female with a past medical history significant for diabetes who present today to follow up on diabetes management. Patient was recently seen by nephrology and was diagnosed with mild chronic kidney disease.  Echocardiogram showed normal EF with mild diastolic dysfunction.  Patient reports that since last office visit swelling in her lower extremity has improved.  She has been taking her blood pressure medication as prescribed.  From a diabetes standpoint patient reports that she has not been taking Victoza as instructed by Dr. Valentina Lucks.  She continues to do Lantus 30 units at night.  Last A1c 2-1/2 weeks ago at nephrologist office was 5.7.  She currently denies any polyuria and polydipsia.  She denies any chest pain, lower extremity edema, shortness of breath, abdominal pain, headache, vision change.  Smoking status reviewed   ROS: all other systems were reviewed and are negative other than in the HPI   Past Medical History:  Diagnosis Date  . Arthritis   . Diabetes mellitus without complication ([place]) 6503  . Environmental and seasonal allergies   . GERD (gastroesophageal reflux disease)   . Hepatitis C, chronic ([place])    treated  . Hyperlipidemia associated with type 2 diabetes mellitus ([place])   . Hypertension associated with diabetes ([place])   . Myogenic ptosis of left eyelid   . Schizophrenia [facility])     Past Surgical History:  Procedure Laterality Date  . CATARACT EXTRACTION W/ INTRAOCULAR LENS  IMPLANT, BILATERAL    . COLONOSCOPY    . JOINT REPLACEMENT Left    Hip  . PTOSIS REPAIR Left 12/25/2017   Procedure: INTERNAL PTOSIS REPAIR LEFT EYE;  Surgeon: Clista Bernhardt, MD;  Location: [place];  Service: Ophthalmology;  Laterality: Left;  . TONSILLECTOMY      Past medical history, surgical, family, and social history reviewed  and updated in the EMR as appropriate.  Objective:  BP 130/78   Pulse 69   Temp 98 F (36.7 C) (Oral)   Ht 5' (1.524 m)   Wt 167 lb (75.8 kg)   SpO2 97%   BMI 32.61 kg/m   Vitals and nursing note reviewed  General: NAD, pleasant, able to participate in exam Cardiac: RRR, normal heart sounds, no murmurs. 2+ radial and PT pulses bilaterally Respiratory: CTAB, normal effort, No wheezes, rales or rhonchi Abdomen: soft, nontender, nondistended, no hepatic or splenomegaly, +BS Extremities: no edema or cyanosis. WWP. Skin: warm and dry, no rashes noted Neuro: alert and oriented x4, no focal deficits Psych: Normal affect and mood   Assessment & Plan:   Type 2 diabetes mellitus (HCC) A1c today is 6.7 up from 6.1 3 months ago.  Patient has been on Lantus but has not taken Victoza as instructed.  Discussed need to restart all medication but patient is reluctant for the time being.  She has agreed that if A1c increase by next office visit she will be willing to restart Victoza.  For the time being patient will continue with Lantus 20 units at night.  She will also continue to work on dietary modification and physical activity.  We will follow-up in 3 months for next A1c check or sooner as needed. Patient follow-up with nephrology she has a renal panel ordered, will follow up on the results.   Marjie Skiff, MD [place]  Family Medicine PGY-3

## 2018-06-09 NOTE — Assessment & Plan Note (Addendum)
A1c today is 6.7 up from 6.1 3 months ago.  Patient has been on Lantus but has not taken Victoza as instructed.  Discussed need to restart all medication but patient is reluctant for the time being.  She has agreed that if A1c increase by next office visit she will be willing to restart Victoza.  For the time being patient will continue with Lantus 20 units at night.  She will also continue to work on dietary modification and physical activity.  We will follow-up in 3 months for next A1c check or sooner as needed. Patient follow-up with nephrology she has a renal panel ordered, will follow up on the results.

## 2018-06-09 NOTE — Patient Instructions (Signed)
It was great seeing you today! We have addressed the following issues today  1. Your A1c is 6.7 slightly higher than last time but still fine.Continue with Lantus. AS discussed if you number is the same or higher next time I see you I want you to take the Victoza like you were doing before. 2. The cream for your foot is called Lamisil.  If we did any lab work today, and the results require attention, either me or my nurse will get in touch with you. If everything is normal, you will get a letter in mail and a message via . If you don't hear from [place] in two weeks, please give [place] a call. Otherwise, we look forward to seeing you again at your next visit. If you have any questions or concerns before then, please call the clinic at [phone].  Please bring all your medications to every doctors visit  Sign up for My Chart to have easy access to your labs results, and communication with your Primary care physician. Please ask Front Desk for some assistance.   Please check-out at the front desk before leaving the clinic.    Take Care,   Dr. Andy Gauss

## 2018-06-12 ENCOUNTER — Ambulatory Visit
Admission: RE | Admit: 2018-06-12 | Discharge: 2018-06-12 | Disposition: A | Discharge status: Home / Self Care | Payer: Medicaid Other | Source: Ambulatory Visit | Attending: Internal Medicine | Admitting: Internal Medicine

## 2018-06-12 DIAGNOSIS — N183 Chronic kidney disease, stage 3 unspecified: Secondary | ICD-10-CM

## 2018-06-15 ENCOUNTER — Other Ambulatory Visit: Payer: Self-pay | Admitting: Family Medicine

## 2018-06-15 DIAGNOSIS — Z794 Long term (current) use of insulin: Secondary | ICD-10-CM

## 2018-06-15 DIAGNOSIS — E1100 Type 2 diabetes mellitus with hyperosmolarity without nonketotic hyperglycemic-hyperosmolar coma (NKHHC): Secondary | ICD-10-CM

## 2018-06-17 ENCOUNTER — Telehealth: Payer: Self-pay | Admitting: Family Medicine

## 2018-06-17 DIAGNOSIS — Z794 Long term (current) use of insulin: Secondary | ICD-10-CM

## 2018-06-17 DIAGNOSIS — I208 Other forms of angina pectoris: Secondary | ICD-10-CM

## 2018-06-17 DIAGNOSIS — L509 Urticaria, unspecified: Secondary | ICD-10-CM

## 2018-06-17 DIAGNOSIS — E1100 Type 2 diabetes mellitus with hyperosmolarity without nonketotic hyperglycemic-hyperosmolar coma (NKHHC): Secondary | ICD-10-CM

## 2018-06-17 DIAGNOSIS — E1169 Type 2 diabetes mellitus with other specified complication: Secondary | ICD-10-CM

## 2018-06-17 DIAGNOSIS — E785 Hyperlipidemia, unspecified: Secondary | ICD-10-CM

## 2018-06-17 NOTE — Telephone Encounter (Signed)
Pt would like for Dr. Andy Gauss to call her.

## 2018-06-18 ENCOUNTER — Other Ambulatory Visit: Payer: Self-pay | Admitting: Internal Medicine

## 2018-06-18 NOTE — Telephone Encounter (Signed)
LM for patient asking her to call back.  Naidelyn Parrella,CMA

## 2018-06-22 MED ORDER — FLUOXETINE HCL 20 MG PO CAPS: 20.0000 mg | ORAL_CAPSULE | Freq: Every day | ORAL | 1 refills | Status: DC

## 2018-06-22 MED ORDER — DIPHENHYDRAMINE HCL 25 MG PO TABS: 25.0000 mg | ORAL_TABLET | Freq: Four times a day (QID) | ORAL | 0 refills | Status: DC | PRN

## 2018-06-22 MED ORDER — GLUCOSE BLOOD VI STRP: ORAL_STRIP | 2 refills | Status: DC

## 2018-06-22 MED ORDER — PANTOPRAZOLE SODIUM 40 MG PO TBEC: 40.0000 mg | DELAYED_RELEASE_TABLET | Freq: Every day | ORAL | 0 refills | Status: DC

## 2018-06-22 MED ORDER — VALSARTAN-HYDROCHLOROTHIAZIDE 160-12.5 MG PO TABS: 1.0000 | ORAL_TABLET | Freq: Every day | ORAL | 0 refills | Status: DC

## 2018-06-22 MED ORDER — ATORVASTATIN CALCIUM 40 MG PO TABS: 40.0000 mg | ORAL_TABLET | Freq: Every day | ORAL | 3 refills | Status: DC

## 2018-06-22 MED ORDER — FUROSEMIDE 20 MG PO TABS: 20.0000 mg | ORAL_TABLET | Freq: Every day | ORAL | 0 refills | Status: DC

## 2018-06-22 MED ORDER — CETIRIZINE HCL 10 MG PO TABS: 10.0000 mg | ORAL_TABLET | Freq: Every day | ORAL | 1 refills | Status: DC | PRN

## 2018-06-22 MED ORDER — GABAPENTIN 300 MG PO CAPS: 300.0000 mg | ORAL_CAPSULE | Freq: Three times a day (TID) | ORAL | 2 refills | Status: DC

## 2018-06-22 MED ORDER — ASPIRIN 81 MG PO TBEC: 81.0000 mg | DELAYED_RELEASE_TABLET | Freq: Every day | ORAL | 2 refills | Status: DC

## 2018-06-22 MED ORDER — INSULIN GLARGINE 100 UNIT/ML SOLOSTAR PEN: PEN_INJECTOR | SUBCUTANEOUS | 0 refills | Status: DC

## 2018-06-22 MED ORDER — ZIPRASIDONE HCL 20 MG PO CAPS: 20.0000 mg | ORAL_CAPSULE | Freq: Every day | ORAL | 1 refills | Status: DC

## 2018-06-22 MED ORDER — AMLODIPINE BESYLATE 10 MG PO TABS: 10.0000 mg | ORAL_TABLET | Freq: Every day | ORAL | 11 refills | Status: DC

## 2018-06-22 MED ORDER — HYDROCORTISONE 1 % EX OINT: 1.0000 "application " | TOPICAL_OINTMENT | Freq: Two times a day (BID) | CUTANEOUS | 0 refills | Status: DC | PRN

## 2018-06-22 NOTE — Telephone Encounter (Signed)
Orders signed.  Thank you  Marjie Skiff, MD [place], PGY-3

## 2018-06-22 NOTE — Telephone Encounter (Signed)
Spoke with patient and she states that she needed all of her medications refilled.  Pended orders and sent them to provider.  Kayla Gregory,CMA

## 2018-06-23 ENCOUNTER — Other Ambulatory Visit: Payer: Self-pay | Admitting: Internal Medicine

## 2018-06-23 DIAGNOSIS — N281 Cyst of kidney, acquired: Secondary | ICD-10-CM

## 2018-06-25 ENCOUNTER — Telehealth: Payer: Self-pay | Admitting: Family Medicine

## 2018-06-25 NOTE — Telephone Encounter (Signed)
Called patient reached VM did not leave message. Will try tomorrow.  Thanks   Marjie Skiff, MD [place], PGY-3

## 2018-06-25 NOTE — Telephone Encounter (Signed)
Will forward to MD. Gillian Kluever,CMA  

## 2018-06-25 NOTE — Telephone Encounter (Signed)
Pt called and said that she does not want Dr. Andy Gauss to fill her Fluoxetine and Ziprasidone through our office. Pt said she wants to get these medications with her mental health doctor. Pt would like for Dr. Andy Gauss to call her as soon as possible. The best contact number is [phone].

## 2018-06-30 ENCOUNTER — Ambulatory Visit
Admission: RE | Admit: 2018-06-30 | Discharge: 2018-06-30 | Disposition: A | Discharge status: Home / Self Care | Payer: Medicaid Other | Source: Ambulatory Visit | Attending: Internal Medicine | Admitting: Internal Medicine

## 2018-06-30 ENCOUNTER — Other Ambulatory Visit: Payer: Self-pay | Admitting: Internal Medicine

## 2018-06-30 DIAGNOSIS — N281 Cyst of kidney, acquired: Secondary | ICD-10-CM

## 2018-07-07 ENCOUNTER — Telehealth: Payer: Self-pay | Admitting: Family Medicine

## 2018-07-07 NOTE — Telephone Encounter (Signed)
Pt would like for Dr. Andy Gauss to call her.

## 2018-07-07 NOTE — Telephone Encounter (Signed)
Attempted to contact pt to obtain more information. Pt did not answer and I had to leave VM.

## 2018-07-09 NOTE — Telephone Encounter (Signed)
Pt is calling returning Page's phone call. Pt states she will just talk to someone when she gets here on Monday. I asked if she would like Dr. Andy Gauss to still call her and when I asked for more detail she would not say. She stated " I just need to speak with him"

## 2018-07-09 NOTE — Telephone Encounter (Signed)
Called pt back- pt has a lot of things she wants to discuss with pcp. She is upset she has to go to so many different doctors? And she doesn't feel she has been treated well.   Pt was appreciated of the call from me, however does want to speak with pcp. I did inform her you were not in the office and it may be next week when you call. She was ok with this.

## 2018-07-09 NOTE — Telephone Encounter (Signed)
Called and talked to patient about nephrology wok up. Questions were answered and patient was appreciative.    Thanks  Marjie Skiff, MD [place], PGY-3

## 2018-07-13 ENCOUNTER — Ambulatory Visit (INDEPENDENT_AMBULATORY_CARE_PROVIDER_SITE_OTHER): Payer: Medicaid Other

## 2018-07-13 DIAGNOSIS — Z23 Encounter for immunization: Secondary | ICD-10-CM | POA: Diagnosis not present

## 2018-07-13 NOTE — Progress Notes (Signed)
Pt presents in nurse clinic for flu vaccine. Injection given LD, site unremarkable. Epic and [facility] updated.   During nurse visit pt mentioned she noticed some blood in her stool yesterday. Pt denies having hemorrhoids and stated it only happened yesterday and not today. I offered her an apt, she declined at this time. Pt just wants her pcp to be aware. Will route note to PCP.

## 2018-07-23 IMAGING — MR MR SHOULDER*R* WO/W CM
1 series · 19 of 26 positions shown · IV contrast (multihance)
Comparison: None.

CLINICAL DATA: Chronic right shoulder pain after sustaining a fall
October 2016.

EXAM:
MRI OF THE RIGHT SHOULDER WITHOUT AND WITH CONTRAST
TECHNIQUE: Multiplanar, multisequence MR imaging of the shoulder was performed
before and after the administration of intravenous contrast.
CONTRAST:  15mL MULTIHANCE GADOBENATE DIMEGLUMINE 529 MG/ML IV SOLN

[Series 9: T1 · axial · non-contrast · 3.0mm · 0.23mm/px · z∈[+41,+124]mm · 19 of 26 slices shown]
[im 1/26]
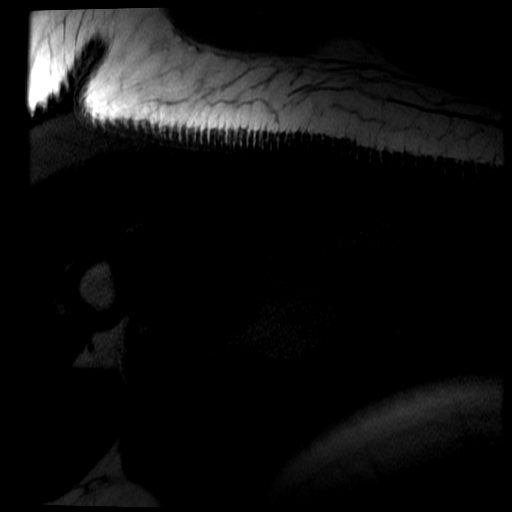
[im 2/26]
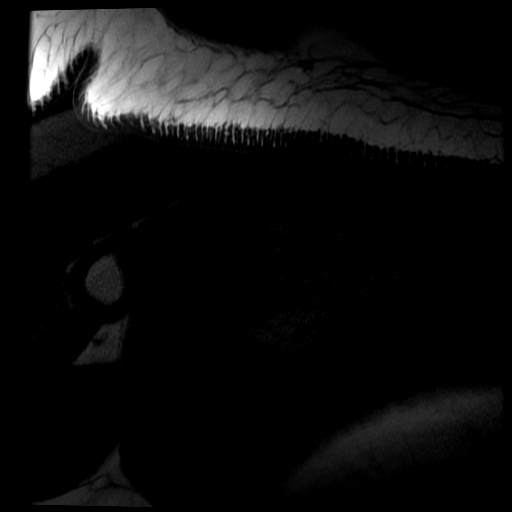
[im 3/26]
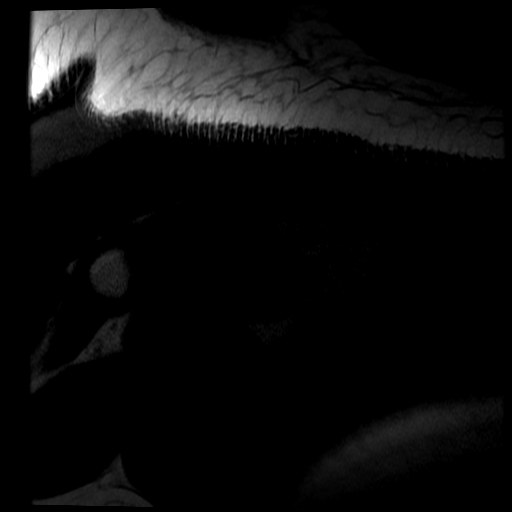
[im 4/26]
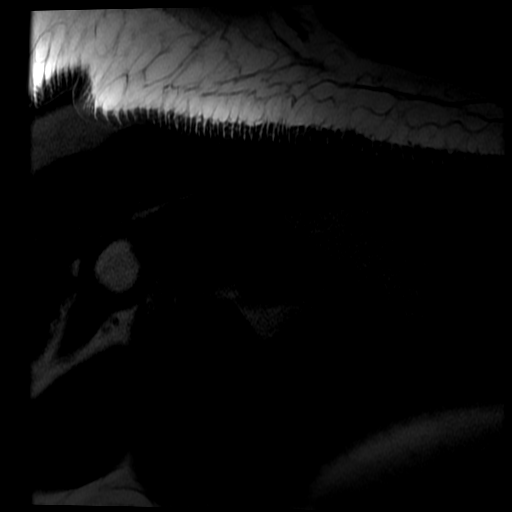
[im 5/26]
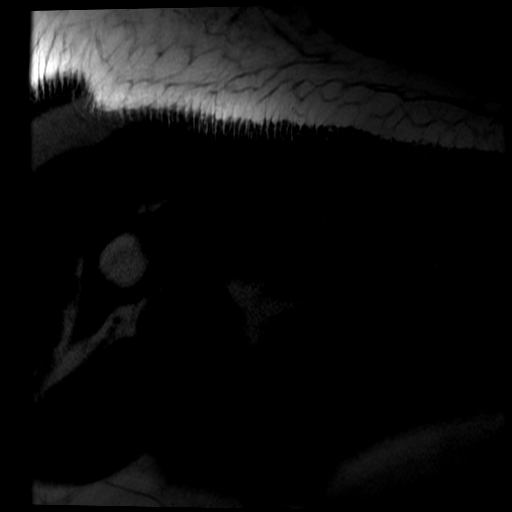
[im 6/26]
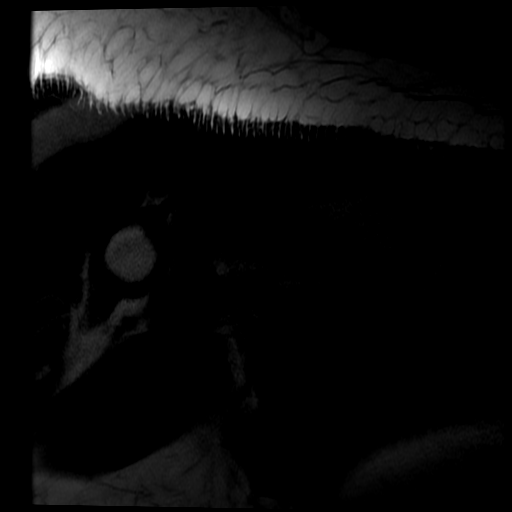
[im 7/26]
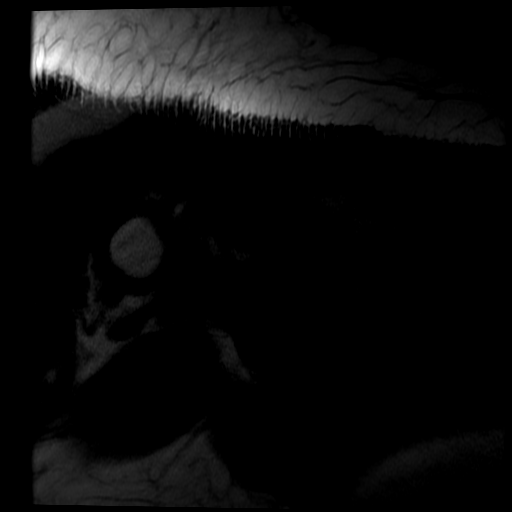
[im 8/26]
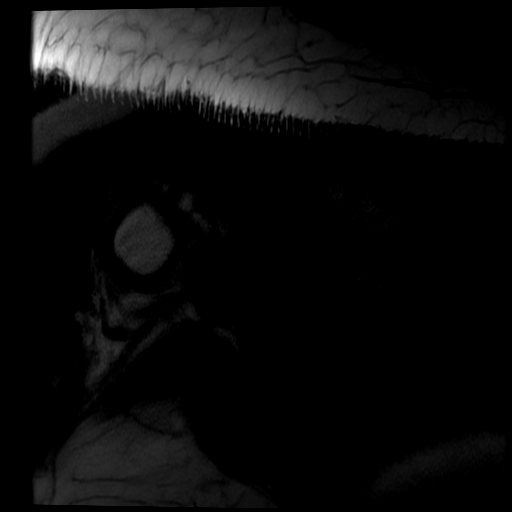
[im 9/26]
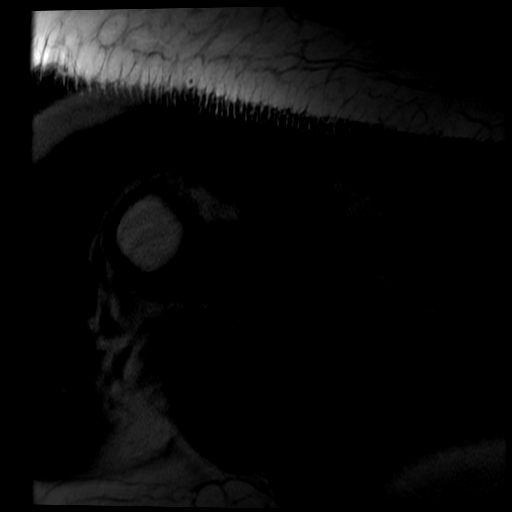
[im 10/26]
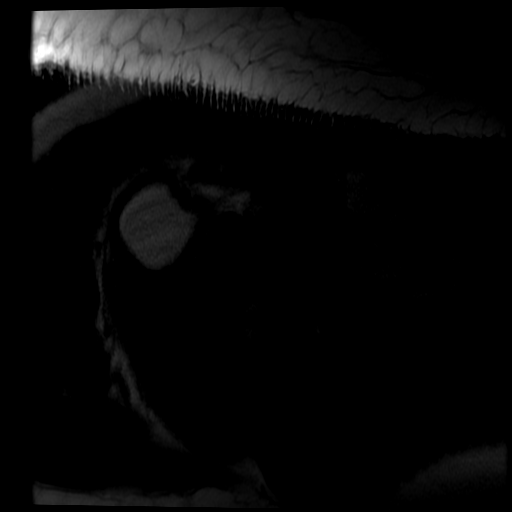
[im 11/26]
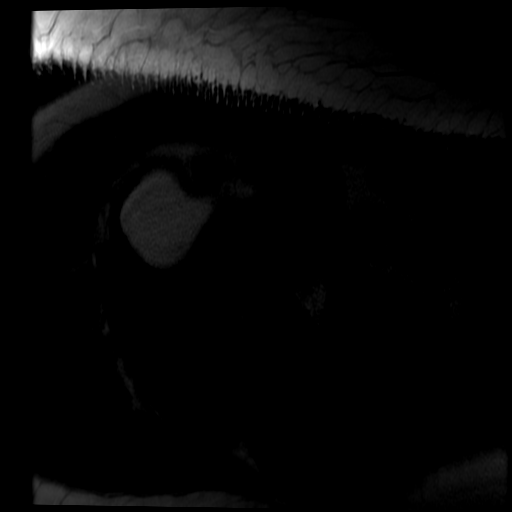
[im 12/26]
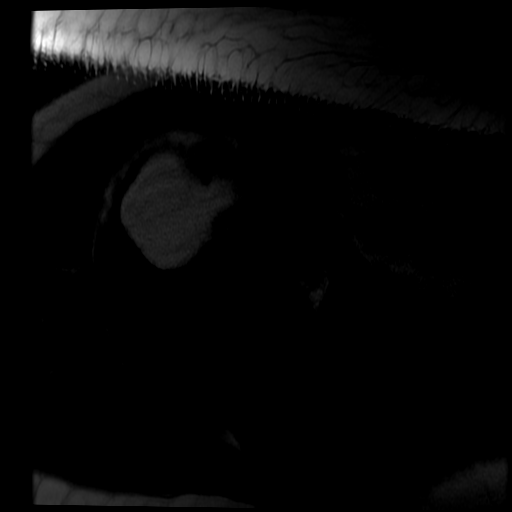
[im 13/26]
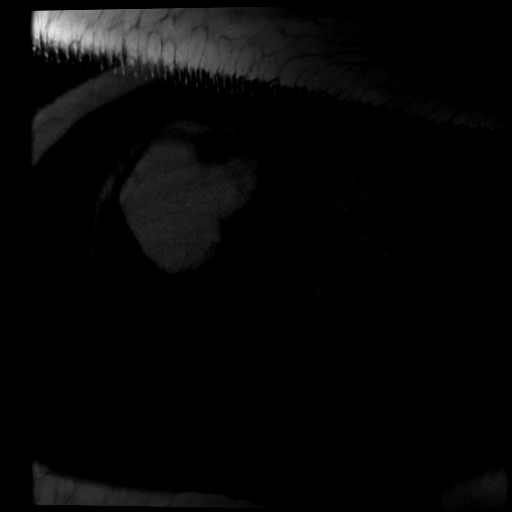
[im 14/26]
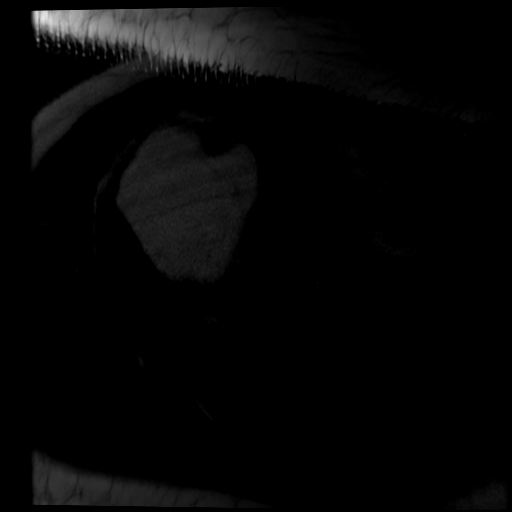
[im 15/26]
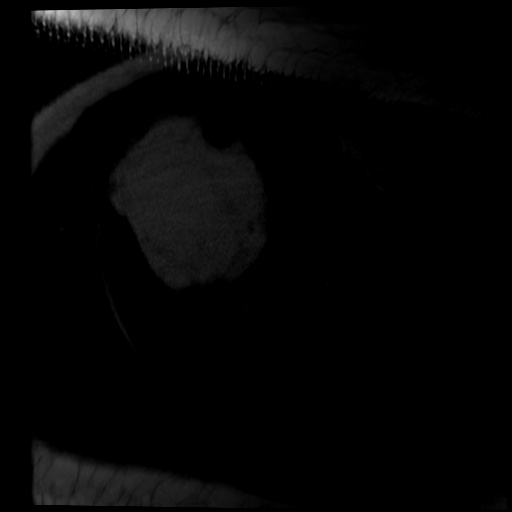
[im 16/26]
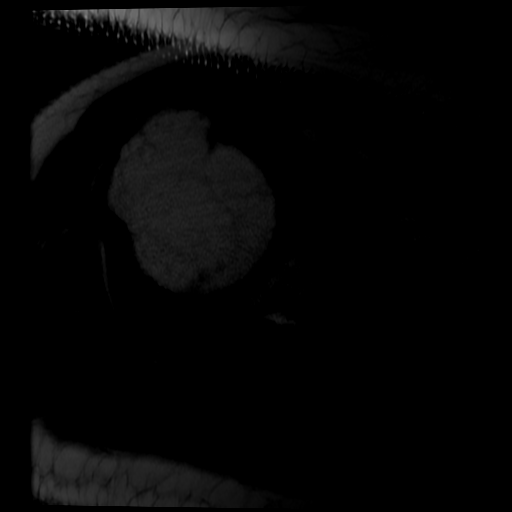
[im 18/26]
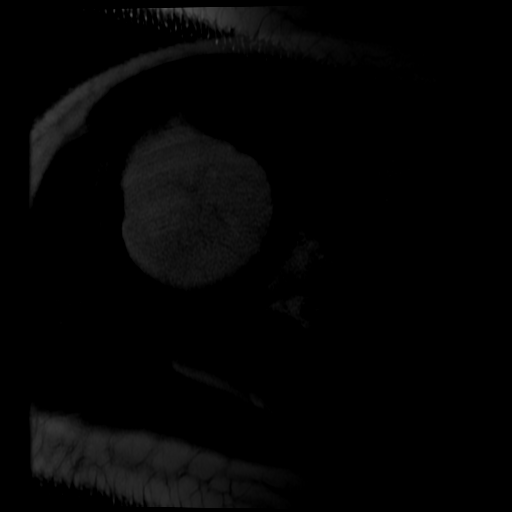
[im 22/26]
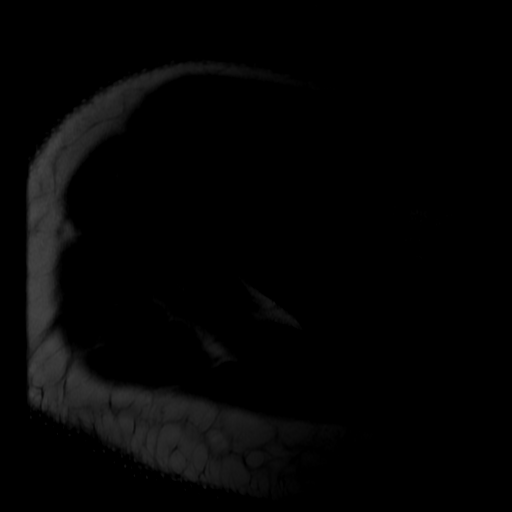
[im 25/26]
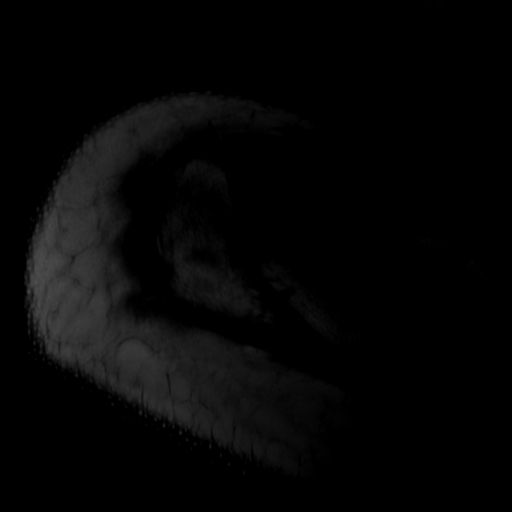

[19 of 26 positions shown; findings below may reference images not displayed]

FINDINGS: Rotator cuff: There is supraspinatus and subscapularis tendinopathy
with thickening. No rotator cuff tear is identified.

Muscles:  No muscle atrophy is identified.

Biceps long head:  Intact.

Acromioclavicular Joint: Mild amount arthropathy of the
acromioclavicular joint with undersurface spurring impressing upon
the myotendinous junction of the supraspinatus muscle and tendon.
Type I acromion. Trace subacromial and subdeltoid bursal fluid.

Glenohumeral Joint: No joint effusion. No chondral defect. Slightly
thickened appearance of the joint capsule within the axillary recess
may be a subtle sign of adhesive capsulitis.

Labrum: Grossly intact, but evaluation is limited by lack of
intraarticular fluid.

Bones:  No marrow abnormality, fracture or dislocation.

Other: None.
IMPRESSION: 1. Tendinopathy of the supraspinatus and subscapularis. No rotator
cuff tear noted.
2. Slightly thickened appearance of the joint capsule along the
axillary recess may represent changes of an adhesive capsulitis.
3. Osteoarthritis of the AC joint with undersurface spurring
impressing upon the myotendinous junction of the supraspinatus.

## 2018-09-04 ENCOUNTER — Telehealth: Payer: Self-pay | Admitting: Family Medicine

## 2018-09-04 NOTE — Telephone Encounter (Signed)
Pt would like for Dr. Andy Gauss to call her as soon as possible. She is it is very urgent. I asked if there was anything I could note to explain what she needs and she would not say.

## 2018-09-07 NOTE — Telephone Encounter (Signed)
Called patient and she didn't mention needing to speak with pcp before her appt tomorrow.  [name] Hartsell,CMA

## 2018-09-08 ENCOUNTER — Other Ambulatory Visit: Payer: Self-pay

## 2018-09-08 ENCOUNTER — Encounter: Payer: Self-pay | Admitting: Family Medicine

## 2018-09-08 ENCOUNTER — Ambulatory Visit (INDEPENDENT_AMBULATORY_CARE_PROVIDER_SITE_OTHER): Payer: Medicaid Other | Admitting: Family Medicine

## 2018-09-08 VITALS — BP 122/68 | HR 69 | Temp 98.0°F | Ht 60.0 in | Wt 175.0 lb

## 2018-09-08 DIAGNOSIS — Z794 Long term (current) use of insulin: Secondary | ICD-10-CM

## 2018-09-08 DIAGNOSIS — E1100 Type 2 diabetes mellitus with hyperosmolarity without nonketotic hyperglycemic-hyperosmolar coma (NKHHC): Secondary | ICD-10-CM | POA: Diagnosis not present

## 2018-09-08 LAB — POCT GLYCOSYLATED HEMOGLOBIN (HGB A1C): HbA1c, POC (controlled diabetic range): 7 % (ref 0.0–7.0)

## 2018-09-08 NOTE — Progress Notes (Signed)
   Subjective:    Patient ID: Kayla Gregory, female    DOB: 1960/09/17, 58 y.o.   MRN: [id number]   CC: Follow up for T2DM  HPI: Patient is a 58 yo female with a complex past medical history who presents today to follow up on T2DM management. Patient reports she is currently on Lantus 30 units daily. She has not started the Victoza as recommend by Dr. Valentina Lucks a few months ago. Patient has failed metformin in the past and is unwilling to try other oral agents. She currently just want to work on her diet and continue with her Lantus regimen. She denies any polyuria or polydipsia. Patient denies any chest pain, shortness of breath, abdominal pain, nausea, vomiting, headache or dizziness.  Smoking status reviewed   ROS: all other systems were reviewed and are negative other than in the HPI   Past Medical History:  Diagnosis Date  . Arthritis   . Diabetes mellitus without complication ([place]) 6767  . Environmental and seasonal allergies   . GERD (gastroesophageal reflux disease)   . Hepatitis C, chronic ([place])    treated  . Hyperlipidemia associated with type 2 diabetes mellitus ([place])   . Hypertension associated with diabetes ([place])   . Myogenic ptosis of left eyelid   . Schizophrenia [facility])     Past Surgical History:  Procedure Laterality Date  . CATARACT EXTRACTION W/ INTRAOCULAR LENS  IMPLANT, BILATERAL    . COLONOSCOPY    . JOINT REPLACEMENT Left    Hip  . PTOSIS REPAIR Left 12/25/2017   Procedure: INTERNAL PTOSIS REPAIR LEFT EYE;  Surgeon: Clista Bernhardt, MD;  Location: [place];  Service: Ophthalmology;  Laterality: Left;  . TONSILLECTOMY      Past medical history, surgical, family, and social history reviewed and updated in the EMR as appropriate.  Objective:  BP 122/68 (BP Location: Right Arm, Patient Position: Sitting, Cuff Size: Normal)   Pulse 69   Temp 98 F (36.7 C)   Ht 5' (1.524 m)   Wt 175 lb (79.4 kg)   SpO2 99%   BMI 34.18 kg/m   Vitals and nursing note  reviewed  General: NAD, pleasant, able to participate in exam Cardiac: RRR, normal heart sounds, no murmurs. 2+ radial and PT pulses bilaterally Respiratory: CTAB, normal effort, No wheezes, rales or rhonchi Abdomen: soft, nontender, nondistended, no hepatic or splenomegaly, +BS Extremities: no edema or cyanosis. WWP. Skin: warm and dry, no rashes noted Neuro: alert and oriented x4, no focal deficits Psych: Normal affect and mood   Assessment & Plan:   Type 2 diabetes mellitus (HCC) A1c today is 7.0 slightly up from 6.7 back in October 2019.  Patient continued to be on Lantus 30 units daily.  She has refused to use Victoza as previously discussed with Dr. Valentina Lucks.  Recommended starting using Victoza in addition to lifestyle modifications.  We will recheck in 3 months.  For now continue with current Lantus regimen.  Patient is followed by nephrology for his CKD secondary to diabetes.  Will repeat labs today. --Order BMP  Will need to update her eye exam in her chart she recently saw ophthalmology.   Marjie Skiff, MD [place] PGY-3

## 2018-09-08 NOTE — Patient Instructions (Signed)
It was great seeing you today! We have addressed the following issues today  1. Your a1c is 7.0. Stable, continue taking the lantus and consider the Victoza as discussed today. 2. I will check your kidney function today.  If we did any lab work today, and the results require attention, either me or my nurse will get in touch with you. If everything is normal, you will get a letter in mail and a message via . If you don't hear from [place] in two weeks, please give [place] a call. Otherwise, we look forward to seeing you again at your next visit. If you have any questions or concerns before then, please call the clinic at [phone].  Please bring all your medications to every doctors visit  Sign up for My Chart to have easy access to your labs results, and communication with your Primary care physician. Please ask Front Desk for some assistance.   Please check-out at the front desk before leaving the clinic.    Take Care,   Dr. Andy Gauss

## 2018-09-08 NOTE — Assessment & Plan Note (Signed)
A1c today is 7.0 slightly up from 6.7 back in October 2019.  Patient continued to be on Lantus 30 units daily.  She has refused to use Victoza as previously discussed with Dr. Valentina Lucks.  Recommended starting using Victoza in addition to lifestyle modifications.  We will recheck in 3 months.  For now continue with current Lantus regimen.  Patient is followed by nephrology for his CKD secondary to diabetes.  Will repeat labs today. --Order BMP

## 2018-09-09 LAB — BASIC METABOLIC PANEL
BUN/Creatinine Ratio: 20 (ref 9–23)
BUN: 32 mg/dL — AB (ref 6–24)
CO2: 25 mmol/L (ref 20–29)
CREATININE: 1.58 mg/dL — AB (ref 0.57–1.00)
Calcium: 9.3 mg/dL (ref 8.7–10.2)
Chloride: 105 mmol/L (ref 96–106)
GFR calc Af Amer: 41 mL/min/{1.73_m2} — ABNORMAL LOW (ref >59)
GFR calc non Af Amer: 36 mL/min/{1.73_m2} — ABNORMAL LOW (ref >59)
GLUCOSE: 114 mg/dL — AB (ref 65–99)
Potassium: 4.4 mmol/L (ref 3.5–5.2)
Sodium: 142 mmol/L (ref 134–144)

## 2018-09-14 ENCOUNTER — Other Ambulatory Visit: Payer: Self-pay | Admitting: Family Medicine

## 2018-09-14 DIAGNOSIS — Z794 Long term (current) use of insulin: Secondary | ICD-10-CM

## 2018-09-14 DIAGNOSIS — E1100 Type 2 diabetes mellitus with hyperosmolarity without nonketotic hyperglycemic-hyperosmolar coma (NKHHC): Secondary | ICD-10-CM

## 2018-10-22 ENCOUNTER — Other Ambulatory Visit: Payer: Self-pay

## 2018-10-22 DIAGNOSIS — Z794 Long term (current) use of insulin: Secondary | ICD-10-CM

## 2018-10-22 DIAGNOSIS — E1100 Type 2 diabetes mellitus with hyperosmolarity without nonketotic hyperglycemic-hyperosmolar coma (NKHHC): Secondary | ICD-10-CM

## 2018-10-22 MED ORDER — INSULIN GLARGINE 100 UNIT/ML SOLOSTAR PEN: PEN_INJECTOR | SUBCUTANEOUS | 3 refills | Status: DC

## 2018-10-29 ENCOUNTER — Telehealth: Payer: Self-pay | Admitting: Family Medicine

## 2018-10-29 NOTE — Telephone Encounter (Signed)
Didn't route the first time, routing to PCP team.

## 2018-10-29 NOTE — Telephone Encounter (Signed)
[facility] calling to let Dr. Diallo/Dr. Jeannine Kitten know about this patients recent appts. Pt was referred to them and has her new pt appt on 05/28/2018, which she did go to. On 09/07/2018 pt no showed an appt and then on 09/28/2018 the pt cancelled the appt and did not reschedule at that time. [facility] called her to check in and see if she wished to reschedule missed appts and pt refused to reschedule at this time. They discussed with her how important it was to see a kidney specialist given her health with diabeties and pt still refused.   [facility] just wanted to let us know that if she were to be schedule with them again the pt would have to be re-referred.

## 2018-10-29 NOTE — Telephone Encounter (Signed)
Will forward to MD to inform him of this. [name] Hartsell,CMA  

## 2018-11-06 ENCOUNTER — Other Ambulatory Visit: Payer: Self-pay

## 2018-11-06 DIAGNOSIS — L509 Urticaria, unspecified: Secondary | ICD-10-CM

## 2018-11-06 MED ORDER — CETIRIZINE HCL 10 MG PO TABS: 10.0000 mg | ORAL_TABLET | Freq: Every day | ORAL | 1 refills | Status: DC | PRN

## 2018-11-23 ENCOUNTER — Other Ambulatory Visit: Payer: Self-pay | Admitting: *Deleted

## 2018-11-23 ENCOUNTER — Telehealth: Payer: Self-pay | Admitting: Family Medicine

## 2018-11-23 DIAGNOSIS — Z794 Long term (current) use of insulin: Secondary | ICD-10-CM

## 2018-11-23 DIAGNOSIS — E1100 Type 2 diabetes mellitus with hyperosmolarity without nonketotic hyperglycemic-hyperosmolar coma (NKHHC): Secondary | ICD-10-CM

## 2018-11-23 MED ORDER — ACCU-CHEK AVIVA PLUS W/DEVICE KIT: 1.0000 "application " | PACK | Freq: Three times a day (TID) | 0 refills | Status: DC

## 2018-11-23 MED ORDER — GLUCOSE BLOOD VI STRP: ORAL_STRIP | 2 refills | Status: DC

## 2018-11-23 NOTE — Telephone Encounter (Signed)
Pt was scheduled for an OV with Diallo this Thursday - told pt this has to be cancelled due to the doctor being elsewhere working for the [place]. Offered pt telemed visit and she declined, stating 'Diallo knows her better than anyone else so she only wants to speak to him.' Told pt I would send a message to [facility] for him to call her. Also told pt it could take up to 1-2 weeks for pt to get a call since he is out of the office. Pt was understanding.

## 2018-11-23 NOTE — Telephone Encounter (Signed)
Pt calls because she is using the last one today.  Verbal given by Dr. McDiarmid to send in under his name. Christen Bame, CMA

## 2018-11-25 ENCOUNTER — Other Ambulatory Visit: Payer: Self-pay | Admitting: *Deleted

## 2018-11-25 MED ORDER — PANTOPRAZOLE SODIUM 40 MG PO TBEC: 40.0000 mg | DELAYED_RELEASE_TABLET | Freq: Every day | ORAL | 3 refills | Status: DC

## 2018-11-26 ENCOUNTER — Ambulatory Visit: Payer: Medicaid Other | Admitting: Family Medicine

## 2018-11-26 ENCOUNTER — Telehealth: Payer: Medicaid Other

## 2018-12-14 ENCOUNTER — Telehealth (INDEPENDENT_AMBULATORY_CARE_PROVIDER_SITE_OTHER): Payer: Medicaid Other | Admitting: Family Medicine

## 2018-12-14 ENCOUNTER — Other Ambulatory Visit: Payer: Self-pay

## 2018-12-14 DIAGNOSIS — J329 Chronic sinusitis, unspecified: Secondary | ICD-10-CM | POA: Insufficient documentation

## 2018-12-14 DIAGNOSIS — J301 Allergic rhinitis due to pollen: Secondary | ICD-10-CM

## 2018-12-14 DIAGNOSIS — J309 Allergic rhinitis, unspecified: Secondary | ICD-10-CM | POA: Insufficient documentation

## 2018-12-14 MED ORDER — FEXOFENADINE HCL 60 MG PO TABS: 60.0000 mg | ORAL_TABLET | Freq: Two times a day (BID) | ORAL | 1 refills | Status: DC

## 2018-12-14 NOTE — Assessment & Plan Note (Signed)
>>  ASSESSMENT AND PLAN FOR ALLERGIC RHINITIS WRITTEN ON 12/14/2018  1:53 PM BY CHAMBLISS, MARSHALL L, MD  Worsening.   No signs of infection (fever or shortness of breath) Will change antihistamines to Allegra which she felt worked better in past.  Suggested nasal saline wash which she might try.  Suggested call back if not better in 3-4 days or if worsening She agree

## 2018-12-14 NOTE — Assessment & Plan Note (Signed)
Worsening.   No signs of infection (fever or shortness of breath) Will change antihistamines to Allegra which she felt worked better in past.  Suggested nasal saline wash which she might try.  Suggested call back if not better in 3-4 days or if worsening She agree

## 2018-12-14 NOTE — Progress Notes (Signed)
[place] Telemedicine Visit  Patient consented to have virtual visit. Method of visit: Video  Encounter participants: Patient: Kayla Gregory - located at home Provider: Lind Covert - located at office Others (if applicable): no  Chief Complaint: Allergies  HPI:  For last week nose is stuffy when PND and clear sputum.  Using Zyrtec but not helping.  Does not want to try nasal steroids.  No fever or shortness of breath or rash or face pain or wheeze  ROS: per HPI  Pertinent PMHx: Hypertension diabetes - taking her medications regularly  Exam:  Respiratory: normal resp pattern walking around her apartment.  No signs of shortness of breath Psych:  Cognition and judgment appear intact. Alert, communicative  and cooperative with normal attention span and concentration. No apparent delusions, illusions, hallucinations   Assessment/Plan:  Allergic rhinitis Worsening.   No signs of infection (fever or shortness of breath) Will change antihistamines to Allegra which she felt worked better in past.  Suggested nasal saline wash which she might try.  Suggested call back if not better in 3-4 days or if worsening She agree     Time spent during visit with patient: 14 minutes

## 2018-12-21 ENCOUNTER — Telehealth: Payer: Self-pay | Admitting: *Deleted

## 2018-12-21 MED ORDER — LORATADINE 10 MG PO TABS: 10.0000 mg | ORAL_TABLET | Freq: Every day | ORAL | 3 refills | Status: DC

## 2018-12-21 NOTE — Telephone Encounter (Signed)
Fexofenadine not covered by medicaid.  See Below:    Please change medication or let me know if you want to pursue a PA. Christen Bame, CMA

## 2018-12-21 NOTE — Telephone Encounter (Signed)
I sent in Rx for claritin

## 2018-12-23 ENCOUNTER — Other Ambulatory Visit: Payer: Self-pay

## 2018-12-23 ENCOUNTER — Ambulatory Visit (INDEPENDENT_AMBULATORY_CARE_PROVIDER_SITE_OTHER): Payer: Medicaid Other | Admitting: Family Medicine

## 2018-12-23 ENCOUNTER — Encounter: Payer: Self-pay | Admitting: Family Medicine

## 2018-12-23 VITALS — BP 130/78 | HR 62

## 2018-12-23 DIAGNOSIS — Z794 Long term (current) use of insulin: Secondary | ICD-10-CM

## 2018-12-23 DIAGNOSIS — E1100 Type 2 diabetes mellitus with hyperosmolarity without nonketotic hyperglycemic-hyperosmolar coma (NKHHC): Secondary | ICD-10-CM

## 2018-12-23 LAB — POCT GLYCOSYLATED HEMOGLOBIN (HGB A1C): HbA1c, POC (controlled diabetic range): 8.3 % — AB (ref 0.0–7.0)

## 2018-12-23 MED ORDER — INSULIN LISPRO 100 UNIT/ML ~~LOC~~ SOLN: 5.0000 [IU] | Freq: Once | SUBCUTANEOUS | 5 refills | Status: DC

## 2018-12-23 NOTE — Patient Instructions (Signed)
It was great seeing you today! We have addressed the following issues today  1. A1c today is 8.3. Increase your Lantus to 40 units and continue to work on your diet and exercise plan 2. Pick up your claritin (allergy medication) at your pharmacy. 3. Follow up with the kidney doctor.  If we did any lab work today, and the results require attention, either me or my nurse will get in touch with you. If everything is normal, you will get a letter in mail and a message via . If you don't hear from [place] in two weeks, please give [place] a call. Otherwise, we look forward to seeing you again at your next visit. If you have any questions or concerns before then, please call the clinic at [phone].  Please bring all your medications to every doctors visit  Sign up for My Chart to have easy access to your labs results, and communication with your Primary care physician. Please ask Front Desk for some assistance.   Please check-out at the front desk before leaving the clinic.    Take Care,   Dr. Andy Gauss

## 2018-12-23 NOTE — Assessment & Plan Note (Signed)
A1c today is 8.7.  Last A1c back in January was 7.0.  Patient has had gradual increase in A1c in the past 6 months.  Patient was previously well controlled with A1c in the mid 6's.  Patient is currently on Lantus 30 units daily.  Given high blood glucose and worsening A1c will titrate Lantus to 40 units.  Patient will continue to work on therapeutic lifestyle changes.  She will follow-up in 3 months for A1c recheck.  We also discussed following up with nephrology given CKD. --Increase Lantus from 30 units to 40 units daily every morning

## 2018-12-23 NOTE — Progress Notes (Signed)
   Subjective:    Patient ID: Kayla Gregory, female    DOB: 24-Apr-1961, 58 y.o.   MRN: [id number]   CC: Follow-up for type 2 diabetes management  HPI: Patient is a 58 year old female with a past medical history significant for type 2 diabetes, hyperlipidemia, CKD, hypertension who presents today to follow-up on type 2 diabetes management.  Patient reports that she has had multiple dietary indiscretions in the past 2 months.  She continues to check her blood glucose once or twice a day.  She reports her blood glucose have been ranging around 180.  She denies any polyuria and polydipsia.  She is currently on Lantus 30 units daily.  Patient reports good adherence.  Smoking status reviewed   ROS: all other systems were reviewed and are negative other than in the HPI   Past Medical History:  Diagnosis Date  . Arthritis   . Diabetes mellitus without complication ([place]) 1937  . Environmental and seasonal allergies   . GERD (gastroesophageal reflux disease)   . Hepatitis C, chronic ([place])    treated  . Hyperlipidemia associated with type 2 diabetes mellitus ([place])   . Hypertension associated with diabetes ([address])   . Myogenic ptosis of left eyelid   . Schizophrenia [facility])     Past Surgical History:  Procedure Laterality Date  . CATARACT EXTRACTION W/ INTRAOCULAR LENS  IMPLANT, BILATERAL    . COLONOSCOPY    . JOINT REPLACEMENT Left    Hip  . PTOSIS REPAIR Left 12/25/2017   Procedure: INTERNAL PTOSIS REPAIR LEFT EYE;  Surgeon: Clista Bernhardt, MD;  Location: [facility];  Service: Ophthalmology;  Laterality: Left;  . TONSILLECTOMY      Past medical history, surgical, family, and social history reviewed and updated in the EMR as appropriate.  Objective:  BP 130/78   Pulse 62   SpO2 96%   Vitals and nursing note reviewed  General: NAD, pleasant, able to participate in exam Cardiac: RRR, normal heart sounds, no murmurs. 2+ radial and PT pulses bilaterally Respiratory: CTAB, normal effort,  No wheezes, rales or rhonchi Abdomen: soft, nontender, nondistended, no hepatic or splenomegaly, +BS Extremities: no edema or cyanosis. WWP. Skin: warm and dry, no rashes noted Neuro: alert and oriented x4, no focal deficits Psych: Normal affect and mood   Assessment & Plan:   Type 2 diabetes mellitus (HCC) A1c today is 8.7.  Last A1c back in January was 7.0.  Patient has had gradual increase in A1c in the past 6 months.  Patient was previously well controlled with A1c in the mid 6's.  Patient is currently on Lantus 30 units daily.  Given high blood glucose and worsening A1c will titrate Lantus to 40 units.  Patient will continue to work on therapeutic lifestyle changes.  She will follow-up in 3 months for A1c recheck.  We also discussed following up with nephrology given CKD. --Increase Lantus from 30 units to 40 units daily every morning    Marjie Skiff, MD [place] PGY-3

## 2018-12-25 ENCOUNTER — Other Ambulatory Visit: Payer: Self-pay | Admitting: *Deleted

## 2018-12-25 DIAGNOSIS — Z794 Long term (current) use of insulin: Secondary | ICD-10-CM

## 2018-12-25 DIAGNOSIS — E1100 Type 2 diabetes mellitus with hyperosmolarity without nonketotic hyperglycemic-hyperosmolar coma (NKHHC): Secondary | ICD-10-CM

## 2018-12-25 MED ORDER — INSULIN GLARGINE 100 UNIT/ML SOLOSTAR PEN: PEN_INJECTOR | SUBCUTANEOUS | 3 refills | Status: DC

## 2018-12-25 NOTE — Telephone Encounter (Signed)
Pt calls because she needs a new script to reflect to 40units she is to now take.  Will forward to MD.  Christen Bame, CMA

## 2018-12-28 ENCOUNTER — Other Ambulatory Visit: Payer: Self-pay

## 2018-12-28 DIAGNOSIS — Z794 Long term (current) use of insulin: Secondary | ICD-10-CM

## 2018-12-28 DIAGNOSIS — E1100 Type 2 diabetes mellitus with hyperosmolarity without nonketotic hyperglycemic-hyperosmolar coma (NKHHC): Secondary | ICD-10-CM

## 2018-12-28 MED ORDER — PEN NEEDLES 31G X 5 MM MISC: 1.0000 "application " | Freq: Two times a day (BID) | 3 refills | Status: DC

## 2019-01-13 ENCOUNTER — Other Ambulatory Visit: Payer: Self-pay

## 2019-01-13 ENCOUNTER — Other Ambulatory Visit: Payer: Self-pay | Admitting: Family Medicine

## 2019-01-13 ENCOUNTER — Telehealth: Payer: Self-pay

## 2019-01-13 MED ORDER — CETIRIZINE HCL 10 MG PO TABS: 10.0000 mg | ORAL_TABLET | Freq: Every day | ORAL | 11 refills | Status: DC

## 2019-01-13 MED ORDER — ACCU-CHEK SOFTCLIX LANCETS MISC: 12 refills | Status: DC

## 2019-01-13 NOTE — Telephone Encounter (Signed)
Pt calling saying she is running out of lancets. Pt states she usually gets 200 at a time so she doesn't run out. Please send refills to [facility]. Ottis Stain, CMA

## 2019-02-08 ENCOUNTER — Encounter (HOSPITAL_COMMUNITY): Payer: Self-pay | Admitting: Emergency Medicine

## 2019-02-08 ENCOUNTER — Emergency Department (HOSPITAL_COMMUNITY)
Admission: EM | Admit: 2019-02-08 | Discharge: 2019-02-08 | Disposition: A | Discharge status: Home / Self Care | Payer: Medicaid Other | Attending: Emergency Medicine | Admitting: Emergency Medicine

## 2019-02-08 ENCOUNTER — Other Ambulatory Visit: Payer: Self-pay

## 2019-02-08 DIAGNOSIS — E119 Type 2 diabetes mellitus without complications: Secondary | ICD-10-CM | POA: Insufficient documentation

## 2019-02-08 DIAGNOSIS — R102 Pelvic and perineal pain: Secondary | ICD-10-CM | POA: Diagnosis present

## 2019-02-08 DIAGNOSIS — I10 Essential (primary) hypertension: Secondary | ICD-10-CM | POA: Insufficient documentation

## 2019-02-08 DIAGNOSIS — Z79899 Other long term (current) drug therapy: Secondary | ICD-10-CM | POA: Diagnosis not present

## 2019-02-08 DIAGNOSIS — Z794 Long term (current) use of insulin: Secondary | ICD-10-CM | POA: Diagnosis not present

## 2019-02-08 DIAGNOSIS — L02224 Furuncle of groin: Secondary | ICD-10-CM | POA: Insufficient documentation

## 2019-02-08 DIAGNOSIS — Z87891 Personal history of nicotine dependence: Secondary | ICD-10-CM | POA: Diagnosis not present

## 2019-02-08 DIAGNOSIS — Z7982 Long term (current) use of aspirin: Secondary | ICD-10-CM | POA: Diagnosis not present

## 2019-02-08 DIAGNOSIS — L0292 Furuncle, unspecified: Secondary | ICD-10-CM

## 2019-02-08 MED ORDER — DOXYCYCLINE HYCLATE 100 MG PO CAPS: 100.0000 mg | ORAL_CAPSULE | Freq: Two times a day (BID) | ORAL | 0 refills | Status: DC

## 2019-02-08 NOTE — ED Notes (Signed)
Pt is alert and oriented x 4 and is verbally responsive. Pt has abscess on left side of vaginal  10/10 pain tender.

## 2019-02-08 NOTE — ED Provider Notes (Signed)
[facility] DEPT Provider Note   CSN: [id number] Arrival date & time: 02/08/19  1519    History   Chief Complaint Chief Complaint  Patient presents with  . Abscess    HPI Kayla Gregory is a 58 y.o. female.     HPI Pt started having swelling in the groin area a few weeks ago.    It is tender to the touch. No drainage.  She has not seen anyone for it.  She did have one years ago and they drained it once in the ED.  No fevers.  Highest temp was 99.1.  Pt denies any other complaints.   Patient states she does have a history of diabetes but her blood sugars have been running normally. Past Medical History:  Diagnosis Date  . Arthritis   . Diabetes mellitus without complication ([place]) 8891  . Environmental and seasonal allergies   . GERD (gastroesophageal reflux disease)   . Hepatitis C, chronic ([place])    treated  . Hyperlipidemia associated with type 2 diabetes mellitus ([place])   . Hypertension associated with diabetes ([place])   . Myogenic ptosis of left eyelid   . Schizophrenia [facility])     Patient Active Problem List   Diagnosis Date Noted  . Allergic rhinitis 12/14/2018  . Non-pitting edema 04/29/2018  . Urticaria 11/20/2017  . Chronic left shoulder pain 12/27/2016  . Acute pain of right shoulder 11/20/2016  . Vomiting 10/09/2016  . Cough 10/09/2016  . Chest pain 10/07/2016  . Trigger finger 10/07/2016  . Great toe pain 05/10/2016  . Right ear pain 03/31/2016  . Partial thickness burn of ankle 02/02/2016  . Healthcare maintenance 12/28/2015  . Stable angina ([place]) 12/22/2015  . Type 2 diabetes mellitus ([place]) 12/22/2015  . Hypertension associated with diabetes ([place]) 12/22/2015  . GERD (gastroesophageal reflux disease) 12/22/2015  . Hepatitis, chronic ([place]) 12/22/2015    Past Surgical History:  Procedure Laterality Date  . CATARACT EXTRACTION W/ INTRAOCULAR LENS  IMPLANT, BILATERAL    . COLONOSCOPY    . JOINT REPLACEMENT Left    Hip   . PTOSIS REPAIR Left 12/25/2017   Procedure: INTERNAL PTOSIS REPAIR LEFT EYE;  Surgeon: Clista Bernhardt, MD;  Location: [facility];  Service: Ophthalmology;  Laterality: Left;  . TONSILLECTOMY       OB History   No obstetric history on file.      Home Medications    Prior to Admission medications   Medication Sig Start Date End Date Taking? Authorizing Provider  Accu-Chek Softclix Lancets lancets Use as instructed 01/13/19   Diallo, Earna Coder, MD  amLODipine (NORVASC) 10 MG tablet Take 1 tablet (10 mg total) by mouth daily. 06/22/18 06/22/19  Diallo, Earna Coder, MD  aspirin (ASPIRIN 81) 81 MG EC tablet Take 1 tablet (81 mg total) by mouth daily. 06/22/18   Diallo, Earna Coder, MD  atorvastatin (LIPITOR) 40 MG tablet Take 1 tablet (40 mg total) by mouth daily. 06/22/18   Diallo, Earna Coder, MD  Blood Glucose Monitoring Suppl (ACCU-CHEK AVIVA PLUS) w/Device KIT 1 application by Does not apply route 4 (four) times daily -  with meals and at bedtime. 11/23/18   Diallo, Earna Coder, MD  cetirizine (ZYRTEC) 10 MG tablet Take 1 tablet (10 mg total) by mouth daily. 01/13/19   Diallo, Earna Coder, MD  diphenhydrAMINE (BENADRYL) 25 MG tablet Take 1 tablet (25 mg total) by mouth every 6 (six) hours as needed for allergies. 06/22/18   Marjie Skiff, MD  doxycycline (VIBRAMYCIN) 100  MG capsule Take 1 capsule (100 mg total) by mouth 2 (two) times daily. 02/08/19   Dorie Rank, MD  FLUoxetine (PROZAC) 20 MG capsule Take 1 capsule (20 mg total) by mouth daily. 06/22/18   Diallo, Earna Coder, MD  furosemide (LASIX) 20 MG tablet Take 1 tablet (20 mg total) by mouth daily. 06/22/18   Diallo, Earna Coder, MD  gabapentin (NEURONTIN) 300 MG capsule Take 1 capsule (300 mg total) by mouth 3 (three) times daily. 06/22/18   Diallo, Earna Coder, MD  glucose blood (ACCU-CHEK AVIVA PLUS) test strip TEST 3 TIMES DAILY.  Dx Code: E11.9 11/23/18   McDiarmid, Blane Ohara, MD  hydrocortisone 1 % ointment Apply 1 application topically 2 (two) times daily as  needed for itching. 06/22/18   Diallo, Earna Coder, MD  Insulin Glargine (LANTUS SOLOSTAR) 100 UNIT/ML Solostar Pen ADMINISTER 40 UNITS UNDER THE SKIN DAILY AT 10 PM 12/25/18   Diallo, Earna Coder, MD  Insulin Pen Needle (PEN NEEDLES) 31G X 5 MM MISC 1 application by Does not apply route 2 (two) times daily before a meal. 12/28/18   Diallo, Earna Coder, MD  Lancet Devices [facility]) lancets Use as instructed Patient not taking: Reported on 12/18/2017 11/22/16   Marjie Skiff, MD  loratadine (CLARITIN) 10 MG tablet Take 1 tablet (10 mg total) by mouth daily. 12/21/18   Lind Covert, MD  losartan-hydrochlorothiazide (HYZAAR) 100-25 MG tablet Take 1 tablet by mouth daily. 12/15/17   Diallo, Earna Coder, MD  Omega-3 Fatty Acids (FISH OIL) 1000 MG CAPS Take 1,000 mg by mouth daily.     [provider]  pantoprazole (PROTONIX) 40 MG tablet Take 1 tablet (40 mg total) by mouth daily. 11/25/18   Diallo, Earna Coder, MD  Propylene Glycol (SYSTANE COMPLETE) 0.6 % SOLN Place 2-3 drops into both eyes daily as needed (for dry eyes).     [provider]  valsartan-hydrochlorothiazide (DIOVAN-HCT) 160-12.5 MG tablet Take 1 tablet by mouth daily. 06/22/18   Diallo, Earna Coder, MD  vitamin B-12 (CYANOCOBALAMIN) 1000 MCG tablet Take 1,000 mcg by mouth daily.    [provider]  ziprasidone (GEODON) 20 MG capsule Take 1 capsule (20 mg total) by mouth daily. 06/22/18   Marjie Skiff, MD    Family History Family History  Problem Relation Age of Onset  . Heart attack Sister 76  . Heart attack Brother 63  . Heart attack Mother 23    Social History Social History   Tobacco Use  . Smoking status: Former Research scientist (life sciences)  . Smokeless tobacco: Never Used  . Tobacco comment: stopped smoking cigareetes in 2001  Substance Use Topics  . Alcohol use: No    Alcohol/week: 0.0 standard drinks    Comment: quit smoking 2001  . Drug use: No    Comment: past crack and marijuana quit in 2001      Allergies   Patient has no known allergies.   Review of Systems Review of Systems  All other systems reviewed and are negative.    Physical Exam Updated Vital Signs BP (!) 157/73 (BP Location: Right Arm)   Pulse 67   Temp 98.7 F (37.1 C) (Oral)   Resp 18   Ht 1.549 m (5' 1" )   Wt 78.9 kg   SpO2 97%   BMI 32.88 kg/m   Physical Exam Vitals signs and nursing note reviewed.  Constitutional:      General: She is not in acute distress.    Appearance: She is well-developed.  HENT:     Head: Normocephalic  and atraumatic.     Right Ear: External ear normal.     Left Ear: External ear normal.  Eyes:     General: No scleral icterus.       Right eye: No discharge.        Left eye: No discharge.     Conjunctiva/sclera: Conjunctivae normal.  Neck:     Musculoskeletal: Neck supple.     Trachea: No tracheal deviation.  Cardiovascular:     Rate and Rhythm: Normal rate.  Pulmonary:     Effort: Pulmonary effort is normal. No respiratory distress.     Breath sounds: No stridor.  Abdominal:     General: There is no distension.  Genitourinary:    Comments: Pea-sized area of induration in the mons pubis region, no fluctuance, no surrounding erythema Musculoskeletal:        General: No swelling or deformity.  Skin:    General: Skin is warm and dry.     Findings: No rash.  Neurological:     Mental Status: She is alert.     Cranial Nerves: Cranial nerve deficit: no gross deficits.      ED Treatments / Results   Procedures Ultrasound ED Soft Tissue  Date/Time: 02/08/2019 5:04 PM Performed by: Dorie Rank, MD Authorized by: Dorie Rank, MD   Procedure details:    Indications: localization of abscess     Transverse view:  Visualized   Longitudinal view:  Visualized   Images: archived   Location:    Location comment:  Pubic region Findings:     no abscess present    no cellulitis present    no foreign body present   (including critical care time)  Medications  Ordered in ED Medications - No data to display   Initial Impression / Assessment and Plan / ED Course  I have reviewed the triage vital signs and the nursing notes.  Pertinent labs & imaging results that were available during my care of the patient were reviewed by me and considered in my medical decision making (see chart for details).   No sign of significant drainable abscess on bedside ultrasound.  Small area of induration.  Consistent with folliculitis/furuncle.  Will discharge home on a course of antibiotics.  Discussed warm compresses.  Outpatient follow-up.  Final Clinical Impressions(s) / ED Diagnoses   Final diagnoses:  Furuncle    ED Discharge Orders         Ordered    doxycycline (VIBRAMYCIN) 100 MG capsule  2 times daily     02/08/19 1657           Dorie Rank, MD 02/08/19 1706

## 2019-02-08 NOTE — ED Notes (Signed)
RN at bedside with provider during assessment.

## 2019-02-08 NOTE — ED Triage Notes (Signed)
Pt complaint of abscess to private area; recently seen for same. Requesting drained.

## 2019-02-08 NOTE — Discharge Instructions (Addendum)
Take the antibiotics as prescribed, apply warm compresses to the area, follow-up with your doctor to be rechecked if symptoms are not improving over the next week

## 2019-02-12 ENCOUNTER — Other Ambulatory Visit: Payer: Self-pay | Admitting: Family Medicine

## 2019-02-12 DIAGNOSIS — Z1231 Encounter for screening mammogram for malignant neoplasm of breast: Secondary | ICD-10-CM

## 2019-02-16 ENCOUNTER — Other Ambulatory Visit: Payer: Self-pay | Admitting: Family Medicine

## 2019-02-16 ENCOUNTER — Telehealth: Payer: Self-pay | Admitting: Family Medicine

## 2019-02-16 DIAGNOSIS — E1100 Type 2 diabetes mellitus with hyperosmolarity without nonketotic hyperglycemic-hyperosmolar coma (NKHHC): Secondary | ICD-10-CM

## 2019-02-16 DIAGNOSIS — Z794 Long term (current) use of insulin: Secondary | ICD-10-CM

## 2019-02-16 NOTE — Telephone Encounter (Signed)
Pt calling to request refill of: strips  Name of Medication(s):  Glucose strips Last date of OV:  12/23/2018 Pharmacy:  [facility] on [address]  Pt stated she is out since Saturday  Will route refill request to Clinic RN.  Discussed with patient policy to call pharmacy for future refills.  Also, discussed refills may take up to 48 hours to approve or deny.  [facility] [place]

## 2019-02-16 NOTE — Telephone Encounter (Signed)
error 

## 2019-02-18 MED ORDER — ACCU-CHEK AVIVA PLUS VI STRP: ORAL_STRIP | 2 refills | Status: DC

## 2019-03-26 ENCOUNTER — Telehealth: Payer: Self-pay | Admitting: *Deleted

## 2019-03-26 ENCOUNTER — Other Ambulatory Visit: Payer: Self-pay | Admitting: Family Medicine

## 2019-03-26 MED ORDER — VALSARTAN-HYDROCHLOROTHIAZIDE 320-25 MG PO TABS: 1.0000 | ORAL_TABLET | Freq: Every day | ORAL | 0 refills | Status: DC

## 2019-03-26 NOTE — Telephone Encounter (Signed)
No documentation of change on file. I reviewed her visit to [facility] where her dose was entered incorrectly. However, no documentation by the ENT provider of her med adjustment.  I have not been able to reach her pharmacist due to precepting and going in with residents to see patients. Please contact pharmacy to check her last dose picked up.  Schedule f/u appointment next week for BP check and medication adjustment. If dose is not clarified. I will escribe her previous does for a 15 days supply for her to come in to be seen.   Please let her know that.

## 2019-03-26 NOTE — Telephone Encounter (Signed)
Spoke to patient and verified BP regimen she is currently taking. This aligns with most recent fill by pharmacy. Refill sent. Patient has appt to f/u on 8/18. Will get BP check and address full medication regimen at that time.

## 2019-03-26 NOTE — Telephone Encounter (Signed)
Contacted pharmacy.  Her last fill was the 320-25 dose by Dr. Jannifer Hick (Nephrologist @ [facility]).  This was on June 1st, 2020  The last time she picked up the 160 - 12.5 dose was 06/04/18. Christen Bame, CMA

## 2019-03-26 NOTE — Telephone Encounter (Signed)
Pt needs refill on her BP medication. Pt has been out and is starting to feel bad. Pt says she probably needs refills on all her meds, but doesn't know. Please let pt know once these have been filled.

## 2019-03-26 NOTE — Telephone Encounter (Signed)
Pt has been out of her valsartan for one week.  There was a mix up at the pharmacy and they were still trying to send the refills to Dr. Andy Gauss, so we never received them.  Pt was put on valsartan 320-25 by [facility] when she went there ONCE because we were not open.  This does not seem to have been changed on her med list but can be found in careeverywhere.   Dr. Ky Barban is unavailable this afternoon so will forward to preceptor Gwendlyn Deutscher) to ask her to refill for one month.  Pt request a call back. Christen Bame, CMA

## 2019-03-26 NOTE — Telephone Encounter (Signed)
Pt is really needing her BP medication and her water pills. She stated that she can not wait all weekend. jw

## 2019-03-26 NOTE — Telephone Encounter (Signed)
Done

## 2019-03-30 ENCOUNTER — Ambulatory Visit: Payer: Medicaid Other | Admitting: Family Medicine

## 2019-04-01 ENCOUNTER — Other Ambulatory Visit: Payer: Self-pay

## 2019-04-01 ENCOUNTER — Ambulatory Visit
Admission: RE | Admit: 2019-04-01 | Discharge: 2019-04-01 | Disposition: A | Discharge status: Home / Self Care | Payer: Medicaid Other | Source: Ambulatory Visit | Attending: Internal Medicine | Admitting: Internal Medicine

## 2019-04-01 DIAGNOSIS — Z1231 Encounter for screening mammogram for malignant neoplasm of breast: Secondary | ICD-10-CM

## 2019-04-02 ENCOUNTER — Other Ambulatory Visit: Payer: Self-pay | Admitting: Internal Medicine

## 2019-04-02 DIAGNOSIS — R928 Other abnormal and inconclusive findings on diagnostic imaging of breast: Secondary | ICD-10-CM

## 2019-04-06 ENCOUNTER — Ambulatory Visit (INDEPENDENT_AMBULATORY_CARE_PROVIDER_SITE_OTHER): Payer: Medicaid Other | Admitting: Family Medicine

## 2019-04-06 ENCOUNTER — Other Ambulatory Visit: Payer: Self-pay

## 2019-04-06 ENCOUNTER — Encounter: Payer: Self-pay | Admitting: Family Medicine

## 2019-04-06 VITALS — BP 124/78 | HR 90 | Ht 60.0 in | Wt 175.0 lb

## 2019-04-06 DIAGNOSIS — Z794 Long term (current) use of insulin: Secondary | ICD-10-CM

## 2019-04-06 DIAGNOSIS — N183 Chronic kidney disease, stage 3 unspecified: Secondary | ICD-10-CM

## 2019-04-06 DIAGNOSIS — E1100 Type 2 diabetes mellitus with hyperosmolarity without nonketotic hyperglycemic-hyperosmolar coma (NKHHC): Secondary | ICD-10-CM | POA: Diagnosis not present

## 2019-04-06 DIAGNOSIS — N184 Chronic kidney disease, stage 4 (severe): Secondary | ICD-10-CM | POA: Insufficient documentation

## 2019-04-06 DIAGNOSIS — E785 Hyperlipidemia, unspecified: Secondary | ICD-10-CM

## 2019-04-06 DIAGNOSIS — I10 Essential (primary) hypertension: Secondary | ICD-10-CM | POA: Diagnosis not present

## 2019-04-06 DIAGNOSIS — E1169 Type 2 diabetes mellitus with other specified complication: Secondary | ICD-10-CM | POA: Diagnosis not present

## 2019-04-06 LAB — POCT GLYCOSYLATED HEMOGLOBIN (HGB A1C): HbA1c, POC (controlled diabetic range): 7.7 % — AB (ref 0.0–7.0)

## 2019-04-06 MED ORDER — VALSARTAN-HYDROCHLOROTHIAZIDE 160-25 MG PO TABS: 1.0000 | ORAL_TABLET | Freq: Every day | ORAL | 0 refills | Status: DC

## 2019-04-06 MED ORDER — LANTUS SOLOSTAR 100 UNIT/ML ~~LOC~~ SOPN: PEN_INJECTOR | SUBCUTANEOUS | 3 refills | Status: DC

## 2019-04-06 MED ORDER — PNEUMOCOCCAL VAC POLYVALENT 25 MCG/0.5ML IJ INJ: 0.5000 mL | INJECTION | Freq: Once | INTRAMUSCULAR | 0 refills | Status: AC

## 2019-04-06 NOTE — Progress Notes (Signed)
Subjective:   Patient ID: Kayla Gregory    DOB: 05/05/1961, 58 y.o. female   MRN: [id number]  GARIE BARLOW is a 58 y.o. female with a history of stable angina, HTN, GERD, chronic hep C, T2DM, CKD3 here for   Hypertension: - Medications: valsartan-HCTZ 320-25mg  daily - Compliance: good - Checking BP at home: yes, 112/80, 107 (lowest SBP) - Denies any SOB, CP, vision changes, LE edema, medication SEs, or symptoms of hypotension - Diet: see below - Exercise: see below  Diabetes, Type 2 - Last A1c 8.3 12/23/18 - Medications: lantus 40u daily - had cramps, nausea with metformin, was on for ~8 years - Compliance: good - Checking BG at home: yes, 67-188 - Diet: 3 meals per day, with some snacks. More baked foods, salad, bananas, apples. - Exercise: walks every day - Eye exam: due ([facility], last saw 02/2019), sees again in 4 weeks - Foot exam: due - Microalbumin: n/a (followed by Nephro for CKD) - Statin: yes - Denies symptoms of hypoglycemia, polyuria, polydipsia, numbness extremities, foot ulcers/trauma  Healthcare Maintenance - Vaccines: pneumococcal, shingrix - Colon Cancer screening: due. Last colonoscopy 2017, 5 years in [place]. [place] at [facility]. - Mammogram: UTD, undergoing workup - Pap Smear: UTD - DEXA Scan: due - Lipid Panel: due  Review of Systems:  Per HPI.  [place], medications and smoking status reviewed.  Objective:   BP 124/78   Pulse 90   Ht 5' (1.524 m)   Wt 175 lb (79.4 kg)   SpO2 94%   BMI 34.18 kg/m  Vitals and nursing note reviewed.  General: overweight female, in no acute distress with non-toxic appearance CV: regular rate and rhythm without murmurs, rubs, or gallops, no lower extremity edema Lungs: clear to auscultation bilaterally with normal work of breathing Skin: warm, dry, no rashes or lesions Extremities: warm and well perfused, normal tone MSK: ROM grossly intact, strength intact, gait normal Neuro: Alert and  oriented, speech normal  Assessment & Plan:   Essential hypertension Normotensive today but with low normal readings at home, will decrease Diovan to 160-25 mg.  Follow-up in 1 week for blood pressure check.  Follow-up in 1 month with provider.  Will obtain BMP today.  Instructed patient if she has symptoms of hypotension to check blood pressure and call with any low measurements, see AVS.  DM (diabetes mellitus) ([place]) Well-controlled for age, A1c 7.7 today.  Will decrease Lantus to 35 units daily given some morning lows and follow-up in 1 month for recheck.  CKD (chronic kidney disease) stage 3, GFR 30-59 ml/min (HCC) Has previously seen nephrology but has not seen in the last year as patient states she does not want to see them.  Reminded patient the importance of following with nephrology in order to prevent progression of kidney disease to ESRD.  Patient verbalized understanding and will make follow-up appointment.  Healthcare maintenance Will obtain records from recent eye exam and colonoscopy in 2017.  Orders placed for bone density scan today.  Also obtaining updated lipid panel.  Given prescription for pneumococcal vaccine.  Patient declined Shingrix vaccine.  Up-to-date with mammogram, is undergoing more work-up for breast nodules, has appointment tomorrow with breast center.  Up-to-date with Pap smear.  Orders Placed This Encounter  Procedures  . DG Bone Density    MCD Pf: baseline Wt: 175 Epic order    Standing Status:   Future    Standing Expiration Date:   06/05/2020    Order Specific  Question:   Reason for Exam (SYMPTOM  OR DIAGNOSIS REQUIRED)    Answer:   estrogen deficiency    Order Specific Question:   Is the patient pregnant?    Answer:   No    Order Specific Question:   Preferred imaging location?    Answer:   [facility]  . Basic Metabolic Panel  . Lipid Panel  . HgB A1c   Meds ordered this encounter  Medications  . valsartan-hydrochlorothiazide  (DIOVAN-HCT) 160-25 MG tablet    Sig: Take 1 tablet by mouth daily.    Dispense:  90 tablet    Refill:  0  . Insulin Glargine (LANTUS SOLOSTAR) 100 UNIT/ML Solostar Pen    Sig: ADMINISTER 35 UNITS UNDER THE SKIN DAILY in the morning    Dispense:  15 mL    Refill:  3  . pneumococcal 23 valent vaccine (PNU-IMMUNE) 25 MCG/0.5ML injection    Sig: Inject 0.5 mLs into the muscle once for 1 dose.    Dispense:  0.5 mL    Refill:  0    Rory Percy, DO PGY-3, [facility] 04/06/2019 9:26 AM

## 2019-04-06 NOTE — Assessment & Plan Note (Signed)
Normotensive today but with low normal readings at home, will decrease Diovan to 160-25 mg.  Follow-up in 1 week for blood pressure check.  Follow-up in 1 month with provider.  Will obtain BMP today.  Instructed patient if she has symptoms of hypotension to check blood pressure and call with any low measurements, see AVS.

## 2019-04-06 NOTE — Assessment & Plan Note (Signed)
Well-controlled for age, A1c 7.7 today.  Will decrease Lantus to 35 units daily given some morning lows and follow-up in 1 month for recheck.

## 2019-04-06 NOTE — Assessment & Plan Note (Signed)
Has previously seen nephrology but has not seen in the last year as patient states she does not want to see them.  Reminded patient the importance of following with nephrology in order to prevent progression of kidney disease to ESRD.  Patient verbalized understanding and will make follow-up appointment.

## 2019-04-06 NOTE — Patient Instructions (Addendum)
It was great to see you!  Our plans for today:  - Decrease your blood pressure medication, valsartan-HCTZ to 160-25mg . I sent in a new prescription for this. - Decrease your lantus to 35 units in the morning. - If you feel dizzy, jittery, or have a headache, you should check your blood sugar or blood pressure and let us know. - take the pneumonia shot prescription to your pharmacy. - come back for a blood pressure check with the nurse next week. Bring your blood pressure cuff with you. - come back in 1 month with your blood sugar measurements.  We are checking some labs today, we will call you or send you a letter if they are abnormal.   Take care and seek immediate care sooner if you develop any concerns.   Dr. Johnsie [facility]

## 2019-04-07 ENCOUNTER — Ambulatory Visit: Payer: Medicaid Other

## 2019-04-07 ENCOUNTER — Ambulatory Visit
Admission: RE | Admit: 2019-04-07 | Discharge: 2019-04-07 | Disposition: A | Discharge status: Home / Self Care | Payer: Medicaid Other | Source: Ambulatory Visit | Attending: Internal Medicine | Admitting: Internal Medicine

## 2019-04-07 DIAGNOSIS — R928 Other abnormal and inconclusive findings on diagnostic imaging of breast: Secondary | ICD-10-CM

## 2019-04-07 LAB — BASIC METABOLIC PANEL
BUN/Creatinine Ratio: 12 (ref 12–28)
BUN: 20 mg/dL (ref 8–27)
CO2: 23 mmol/L (ref 20–29)
Calcium: 10.1 mg/dL (ref 8.7–10.3)
Chloride: 101 mmol/L (ref 96–106)
Creatinine, Ser: 1.67 mg/dL — ABNORMAL HIGH (ref 0.57–1.00)
GFR calc Af Amer: 38 mL/min/{1.73_m2} — ABNORMAL LOW (ref >59)
GFR calc non Af Amer: 33 mL/min/{1.73_m2} — ABNORMAL LOW (ref >59)
Glucose: 102 mg/dL — ABNORMAL HIGH (ref 65–99)
Potassium: 4.4 mmol/L (ref 3.5–5.2)
Sodium: 139 mmol/L (ref 134–144)

## 2019-04-07 LAB — LIPID PANEL
Chol/HDL Ratio: 2.8 ratio (ref 0.0–4.4)
Cholesterol, Total: 150 mg/dL (ref 100–199)
HDL: 54 mg/dL (ref >39)
LDL Calculated: 81 mg/dL (ref 0–99)
Triglycerides: 75 mg/dL (ref 0–149)
VLDL Cholesterol Cal: 15 mg/dL (ref 5–40)

## 2019-04-08 MED ORDER — ATORVASTATIN CALCIUM 80 MG PO TABS: 80.0000 mg | ORAL_TABLET | Freq: Every day | ORAL | 3 refills | Status: DC

## 2019-04-08 NOTE — Addendum Note (Signed)
Addended by: Myles Gip on: 04/08/2019 01:20 PM   Modules accepted: Orders

## 2019-04-27 ENCOUNTER — Other Ambulatory Visit: Payer: Self-pay

## 2019-04-27 ENCOUNTER — Telehealth (INDEPENDENT_AMBULATORY_CARE_PROVIDER_SITE_OTHER): Payer: Medicaid Other | Admitting: Family Medicine

## 2019-04-27 DIAGNOSIS — H5789 Other specified disorders of eye and adnexa: Secondary | ICD-10-CM | POA: Diagnosis not present

## 2019-04-27 NOTE — Assessment & Plan Note (Signed)
Reviewed adverse effects of atorvastatin, only ophthalmologic adverse event reported was blurry vision in <2% of patients which patient is not currently reporting.  Advised patient her symptoms are more consistent with a viral or allergic conjunctivitis rather than true side effect from atorvastatin, especially since she tolerated 40 mg well.  Explained she could likely stay on the 80 mg dose and see if her eyes continue to improve given the swelling is starting to go down but patient was adamant to continue on 40 mg only.  Given her LDL was 81 with goal <70, believe this is reasonable and will continue to stress weight loss through diet and exercise changes.  Advised to keep appointment with eye doctor next week.  Red flags discussed and reasons to return for care.

## 2019-04-27 NOTE — Progress Notes (Signed)
[place] Telemedicine Visit  Patient consented to have virtual visit. Method of visit: Telephone  Encounter participants: Patient: Kayla Gregory - located at home Provider: Rory Percy - located at [facility] Others (if applicable): n/a  Chief Complaint: eyes swelling   HPI:  Patient states she has noticed bilateral eye swelling, redness, itching ever since she started taking the new dose of her atorvastatin, 80 mg.  She was previously on 40 mg of atorvastatin without issues.  She went back to taking the 40 mg dose yesterday and has since noticed her eye swelling is starting to go down.  She notices some redness on the top of her eyelids as well as inside her eyes.  She has not noticed any discharge.  Does note itching.  Denies runny nose, congestion, sneezing, vision changes.  No one else at home has eye complaints.  She regularly sees the eye doctor for shots for "swelling of the inside of her eye from diabetes," next appointment is 9/15.  ROS: per HPI  Pertinent PMHx: HTN, type 2 diabetes, GERD, allergic rhinitis, CKD 3  Exam:  Respiratory: Speaks in full sentences, no respiratory distress  Assessment/Plan:  Eye swelling, bilateral Reviewed adverse effects of atorvastatin, only ophthalmologic adverse event reported was blurry vision in <2% of patients which patient is not currently reporting.  Advised patient her symptoms are more consistent with a viral or allergic conjunctivitis rather than true side effect from atorvastatin, especially since she tolerated 40 mg well.  Explained she could likely stay on the 80 mg dose and see if her eyes continue to improve given the swelling is starting to go down but patient was adamant to continue on 40 mg only.  Given her LDL was 81 with goal <70, believe this is reasonable and will continue to stress weight loss through diet and exercise changes.  Advised to keep appointment with eye doctor next week.  Red flags discussed  and reasons to return for care.   Time spent during visit with patient: 13 minutes

## 2019-05-01 ENCOUNTER — Other Ambulatory Visit: Payer: Self-pay

## 2019-05-01 ENCOUNTER — Encounter (HOSPITAL_COMMUNITY): Payer: Self-pay | Admitting: Emergency Medicine

## 2019-05-01 ENCOUNTER — Emergency Department (HOSPITAL_COMMUNITY)
Admission: EM | Admit: 2019-05-01 | Discharge: 2019-05-01 | Disposition: A | Discharge status: Home / Self Care | Payer: Medicaid Other | Attending: Emergency Medicine | Admitting: Emergency Medicine

## 2019-05-01 DIAGNOSIS — Z87891 Personal history of nicotine dependence: Secondary | ICD-10-CM | POA: Diagnosis not present

## 2019-05-01 DIAGNOSIS — E1122 Type 2 diabetes mellitus with diabetic chronic kidney disease: Secondary | ICD-10-CM | POA: Diagnosis not present

## 2019-05-01 DIAGNOSIS — N183 Chronic kidney disease, stage 3 (moderate): Secondary | ICD-10-CM | POA: Insufficient documentation

## 2019-05-01 DIAGNOSIS — Z7982 Long term (current) use of aspirin: Secondary | ICD-10-CM | POA: Diagnosis not present

## 2019-05-01 DIAGNOSIS — E1159 Type 2 diabetes mellitus with other circulatory complications: Secondary | ICD-10-CM | POA: Diagnosis not present

## 2019-05-01 DIAGNOSIS — I129 Hypertensive chronic kidney disease with stage 1 through stage 4 chronic kidney disease, or unspecified chronic kidney disease: Secondary | ICD-10-CM | POA: Insufficient documentation

## 2019-05-01 DIAGNOSIS — Z79899 Other long term (current) drug therapy: Secondary | ICD-10-CM | POA: Diagnosis not present

## 2019-05-01 DIAGNOSIS — L0231 Cutaneous abscess of buttock: Secondary | ICD-10-CM | POA: Insufficient documentation

## 2019-05-01 DIAGNOSIS — K6289 Other specified diseases of anus and rectum: Secondary | ICD-10-CM | POA: Diagnosis present

## 2019-05-01 DIAGNOSIS — Z794 Long term (current) use of insulin: Secondary | ICD-10-CM | POA: Diagnosis not present

## 2019-05-01 MED ORDER — CLINDAMYCIN HCL 300 MG PO CAPS: 300.0000 mg | ORAL_CAPSULE | Freq: Four times a day (QID) | ORAL | 0 refills | Status: DC

## 2019-05-01 NOTE — ED Notes (Signed)
Patient verbalizes understanding of discharge instructions. Opportunity for questioning and answers were provided. pt discharged from ED. Ambulatory by self  

## 2019-05-01 NOTE — ED Provider Notes (Signed)
[facility] EMERGENCY DEPARTMENT Provider Note   CSN: [id number] Arrival date & time: 05/01/19  6237     History   Chief Complaint Chief Complaint  Patient presents with  . Abscess  . Rectal Pain    HPI Kayla Gregory is a 58 y.o. female.     Patient is a 58 year old female who presents with a sore on her buttocks.  She states over the last 2 days she has had a sore place on her right buttocks.  There is no pain with bowel movements.  No abdominal pain.  No fevers.  No nausea or vomiting.  She has had similar skin sores in other places but not on her buttocks before.  She says is gotten a little bit bigger over the last couple days.       Past Medical History:  Diagnosis Date  . Arthritis   . Diabetes mellitus without complication ([place]) 6283  . Environmental and seasonal allergies   . GERD (gastroesophageal reflux disease)   . Hepatitis C, chronic ([place])    treated  . Hyperlipidemia associated with type 2 diabetes mellitus ([place])   . Hypertension associated with diabetes ([place])   . Myogenic ptosis of left eyelid   . Schizophrenia [facility])     Patient Active Problem List   Diagnosis Date Noted  . Eye swelling, bilateral 04/27/2019  . CKD (chronic kidney disease) stage 3, GFR 30-59 ml/min (HCC) 04/06/2019  . Allergic rhinitis 12/14/2018  . Non-pitting edema 04/29/2018  . Urticaria 11/20/2017  . Chronic left shoulder pain 12/27/2016  . Cough 10/09/2016  . Trigger finger 10/07/2016  . Referred otalgia of right ear 03/31/2016  . Partial thickness burn of ankle 02/02/2016  . Stable angina ([place]) 12/22/2015  . GERD (gastroesophageal reflux disease) 12/22/2015  . Chronic hepatitis C ([place]) 12/22/2015  . DM (diabetes mellitus) ([place]) 03/31/2014  . Essential hypertension 03/31/2014    Past Surgical History:  Procedure Laterality Date  . CATARACT EXTRACTION W/ INTRAOCULAR LENS  IMPLANT, BILATERAL    . COLONOSCOPY    . JOINT REPLACEMENT Left    Hip  .  PTOSIS REPAIR Left 12/25/2017   Procedure: INTERNAL PTOSIS REPAIR LEFT EYE;  Surgeon: Clista Bernhardt, MD;  Location: [place];  Service: Ophthalmology;  Laterality: Left;  . TONSILLECTOMY       OB History   No obstetric history on file.      Home Medications    Prior to Admission medications   Medication Sig Start Date End Date Taking? Authorizing Provider  Accu-Chek Softclix Lancets lancets Use as instructed 01/13/19   Diallo, Earna Coder, MD  aspirin (ASPIRIN 81) 81 MG EC tablet Take 1 tablet (81 mg total) by mouth daily. 06/22/18   Diallo, Earna Coder, MD  atorvastatin (LIPITOR) 80 MG tablet Take 1 tablet (80 mg total) by mouth daily. 04/08/19   Rory Percy, DO  Blood Glucose Monitoring Suppl (ACCU-CHEK AVIVA PLUS) w/Device KIT 1 application by Does not apply route 4 (four) times daily -  with meals and at bedtime. 11/23/18   Diallo, Earna Coder, MD  cetirizine (ZYRTEC) 10 MG tablet Take 1 tablet (10 mg total) by mouth daily. 01/13/19   Diallo, Earna Coder, MD  clindamycin (CLEOCIN) 300 MG capsule Take 1 capsule (300 mg total) by mouth 4 (four) times daily. X 7 days 05/01/19   Malvin Johns, MD  diphenhydrAMINE (BENADRYL) 25 MG tablet Take 1 tablet (25 mg total) by mouth every 6 (six) hours as needed for allergies. 06/22/18  Diallo, Abdoulaye, MD  FLUoxetine (PROZAC) 20 MG capsule Take 1 capsule (20 mg total) by mouth daily. 06/22/18   Diallo, Earna Coder, MD  gabapentin (NEURONTIN) 300 MG capsule Take 1 capsule (300 mg total) by mouth 3 (three) times daily. 06/22/18   Diallo, Earna Coder, MD  glucose blood (ACCU-CHEK AVIVA PLUS) test strip TEST 3 TIMES DAILY.  Dx Code: E11.9 02/18/19   Rory Percy, DO  hydrocortisone 1 % ointment Apply 1 application topically 2 (two) times daily as needed for itching. 06/22/18   Diallo, Earna Coder, MD  Insulin Glargine (LANTUS SOLOSTAR) 100 UNIT/ML Solostar Pen ADMINISTER 35 UNITS UNDER THE SKIN DAILY in the morning 04/06/19   Rory Percy, DO  Insulin Pen Needle  (PEN NEEDLES) 31G X 5 MM MISC 1 application by Does not apply route 2 (two) times daily before a meal. 12/28/18   Diallo, Earna Coder, MD  Lancet Devices [facility]) lancets Use as instructed Patient not taking: Reported on 12/18/2017 11/22/16   Marjie Skiff, MD  pantoprazole (PROTONIX) 40 MG tablet Take 1 tablet (40 mg total) by mouth daily. 11/25/18   Diallo, Earna Coder, MD  Propylene Glycol (SYSTANE COMPLETE) 0.6 % SOLN Place 2-3 drops into both eyes daily as needed (for dry eyes).     [provider]  tobramycin (TOBREX) 0.3 % ophthalmic solution INT 1 GTT IN OU FOUR TIMES DAILY. BEGIN 1 DAY B TREATMENT. CONT THE DAY OF TREATMENT AND 1 FULL DAY AFTER TREATMENT 03/26/19   [provider]  valsartan-hydrochlorothiazide (DIOVAN-HCT) 160-25 MG tablet Take 1 tablet by mouth daily. 04/06/19   Rory Percy, DO  vitamin B-12 (CYANOCOBALAMIN) 1000 MCG tablet Take 1,000 mcg by mouth daily.    [provider]  ziprasidone (GEODON) 20 MG capsule Take 1 capsule (20 mg total) by mouth daily. 06/22/18   Marjie Skiff, MD    Family History Family History  Problem Relation Age of Onset  . Heart attack Sister 21  . Heart attack Brother 17  . Heart attack Mother 19    Social History Social History   Tobacco Use  . Smoking status: Former Research scientist (life sciences)  . Smokeless tobacco: Never Used  . Tobacco comment: stopped smoking cigareetes in 2001  Substance Use Topics  . Alcohol use: No    Alcohol/week: 0.0 standard drinks    Comment: quit smoking 2001  . Drug use: No    Comment: past crack and marijuana quit in 2001     Allergies   Patient has no known allergies.   Review of Systems Review of Systems  Constitutional: Negative for chills, diaphoresis, fatigue and fever.  HENT: Negative for congestion, rhinorrhea and sneezing.   Eyes: Negative.   Respiratory: Negative for cough, chest tightness and shortness of breath.   Cardiovascular: Negative for chest pain and leg  swelling.  Gastrointestinal: Negative for abdominal pain, blood in stool, diarrhea, nausea and vomiting.  Genitourinary: Negative for difficulty urinating, flank pain, frequency and hematuria.  Musculoskeletal: Negative for arthralgias and back pain.  Skin: Positive for wound. Negative for rash.  Neurological: Negative for dizziness, speech difficulty, weakness, numbness and headaches.     Physical Exam Updated Vital Signs BP (!) 149/72 (BP Location: Right Arm)   Pulse 62   Temp 98.6 F (37 C)   Resp 16   SpO2 99%   Physical Exam Constitutional:      Appearance: She is well-developed.  HENT:     Head: Normocephalic and atraumatic.  Eyes:     Pupils: Pupils are equal,  round, and reactive to light.  Neck:     Musculoskeletal: Normal range of motion and neck supple.  Cardiovascular:     Rate and Rhythm: Normal rate and regular rhythm.     Heart sounds: Normal heart sounds.  Pulmonary:     Effort: Pulmonary effort is normal. No respiratory distress.     Breath sounds: Normal breath sounds. No wheezing or rales.  Chest:     Chest wall: No tenderness.  Abdominal:     General: Bowel sounds are normal.     Palpations: Abdomen is soft.     Tenderness: There is no abdominal tenderness. There is no guarding or rebound.  Musculoskeletal: Normal range of motion.  Lymphadenopathy:     Cervical: No cervical adenopathy.  Skin:    General: Skin is warm and dry.     Findings: No rash.     Comments: Patient has a 1 cm slightly indurated area which is a little bit tender on exam to her right buttocks.  It does not appear to communicate with the rectum.  There is no rectal pain.  No drainage.  No fluctuance.  Very minimal redness to the area.  Neurological:     Mental Status: She is alert and oriented to person, place, and time.      ED Treatments / Results  Labs (all labs ordered are listed, but only abnormal results are displayed) Labs Reviewed - No data to display  EKG None   Radiology No results found.  Procedures Procedures (including critical care time)  Medications Ordered in ED Medications - No data to display   Initial Impression / Assessment and Plan / ED Course  I have reviewed the triage vital signs and the nursing notes.  Pertinent labs & imaging results that were available during my care of the patient were reviewed by me and considered in my medical decision making (see chart for details).        Patient with a small early abscess on her buttocks.  There is no suggestions of a perirectal abscess.  I do not appreciate any drainable fluid collection.  She was started on clindamycin.  She was advised to use warm compresses and return if she has any worsening swelling or pain.  Final Clinical Impressions(s) / ED Diagnoses   Final diagnoses:  Abscess of buttock, right    ED Discharge Orders         Ordered    clindamycin (CLEOCIN) 300 MG capsule  4 times daily     05/01/19 1130           Malvin Johns, MD 05/01/19 1133

## 2019-05-01 NOTE — ED Triage Notes (Signed)
Pt. Stated, I have a boil at my rectum since Friday.

## 2019-05-01 NOTE — ED Notes (Signed)
GOT PATIENT IN A GOWN PATIENT IS RESTING WITH CALL BELL IN REACH

## 2019-05-17 ENCOUNTER — Ambulatory Visit (INDEPENDENT_AMBULATORY_CARE_PROVIDER_SITE_OTHER): Payer: Medicaid Other | Admitting: Family Medicine

## 2019-05-17 ENCOUNTER — Other Ambulatory Visit: Payer: Self-pay

## 2019-05-17 ENCOUNTER — Encounter: Payer: Self-pay | Admitting: Family Medicine

## 2019-05-17 VITALS — BP 122/60 | HR 59 | Ht 60.0 in | Wt 177.5 lb

## 2019-05-17 DIAGNOSIS — E1100 Type 2 diabetes mellitus with hyperosmolarity without nonketotic hyperglycemic-hyperosmolar coma (NKHHC): Secondary | ICD-10-CM | POA: Diagnosis not present

## 2019-05-17 DIAGNOSIS — Z794 Long term (current) use of insulin: Secondary | ICD-10-CM | POA: Diagnosis not present

## 2019-05-17 DIAGNOSIS — E1169 Type 2 diabetes mellitus with other specified complication: Secondary | ICD-10-CM | POA: Diagnosis not present

## 2019-05-17 DIAGNOSIS — Z23 Encounter for immunization: Secondary | ICD-10-CM

## 2019-05-17 DIAGNOSIS — E785 Hyperlipidemia, unspecified: Secondary | ICD-10-CM

## 2019-05-17 DIAGNOSIS — N183 Chronic kidney disease, stage 3 unspecified: Secondary | ICD-10-CM

## 2019-05-17 DIAGNOSIS — I10 Essential (primary) hypertension: Secondary | ICD-10-CM | POA: Diagnosis not present

## 2019-05-17 MED ORDER — LANTUS SOLOSTAR 100 UNIT/ML ~~LOC~~ SOPN: PEN_INJECTOR | SUBCUTANEOUS | 3 refills | Status: DC

## 2019-05-17 MED ORDER — CANAGLIFLOZIN 100 MG PO TABS: 100.0000 mg | ORAL_TABLET | Freq: Every day | ORAL | 3 refills | Status: DC

## 2019-05-17 MED ORDER — VALSARTAN-HYDROCHLOROTHIAZIDE 80-12.5 MG PO TABS: 1.0000 | ORAL_TABLET | Freq: Every day | ORAL | 3 refills | Status: DC

## 2019-05-17 MED ORDER — ATORVASTATIN CALCIUM 40 MG PO TABS: 40.0000 mg | ORAL_TABLET | Freq: Every day | ORAL | 3 refills | Status: DC

## 2019-05-17 NOTE — Assessment & Plan Note (Addendum)
A1c 7.7 04/06/2019.  Doing well.  Given CKD, would prefer SGLT2 inhibitor for renal protection, will start canagliflozin and decrease Lantus to 30 units daily.  Follow-up in 1 month.

## 2019-05-17 NOTE — Progress Notes (Signed)
  Subjective:   Patient ID: Kayla Gregory    DOB: 01-19-1961, 58 y.o. female   MRN: [id number]  Kayla Gregory is a 58 y.o. female with a history of HTN, stable angina, chronic hep C, GERD, DM2, trigger finger, CKD 3 here for   Diabetes, Type 2 - Last A1c 7.7 04/06/19 - Medications: Lantus 35u (decreased last visit) - Compliance: good - Checking BG at home: yes, 120-170. Lowest 84. - Diet: 3 meals per day with snacks. Fruits for snacks. Eats vegetables. - Exercise: walking every day.  - Eye exam: UTD - Foot exam: due - Microalbumin: due  - Statin: yes - Denies symptoms of hypoglycemia, numbness extremities, foot ulcers/trauma  Hypertension: - Medications: valsartan-HCTZ 160-25mg  (decreased dose at last visit) - Compliance: good - Checking BP at home: yes, 122/58 - Denies any SOB, CP, LE edema, medication SEs, or symptoms of hypotension - Diet: see above - Exercise: see above  Review of Systems:  Per HPI.  Medications and smoking status reviewed.  Objective:   BP 122/60   Pulse (!) 59   Ht 5' (1.524 m)   Wt 177 lb 8 oz (80.5 kg)   SpO2 98%   BMI 34.67 kg/m  Vitals and nursing note reviewed.  General: Overweight female, in no acute distress with non-toxic appearance Lungs: normal work of breathing Skin: warm, dry, no rashes or lesions Extremities: warm and well perfused, normal tone.  Foot exam performed today Neuro: Alert and oriented, speech normal  Assessment & Plan:   DM (diabetes mellitus) (HCC) A1c 7.7 04/06/2019.  Doing well.  Given CKD, would prefer SGLT2 inhibitor for renal protection, will start canagliflozin and decrease Lantus to 30 units daily.  Follow-up in 1 month.  Essential hypertension Continues to be low normal.  Will decrease valsartan-HCTZ to 80-12.5 mg.  Obtained urine microalbumin today.  Follow-up in 1 month.  CKD (chronic kidney disease) stage 3, GFR 30-59 ml/min (HCC) Obtained urine microalbumin today.  Initiated SGLT2 inhibitor for  further renal protection.  Orders Placed This Encounter  Procedures  . Flu Vaccine QUAD 36+ mos IM  . POCT UA - Microalbumin   Meds ordered this encounter  Medications  . atorvastatin (LIPITOR) 40 MG tablet    Sig: Take 1 tablet (40 mg total) by mouth daily at 6 PM.    Dispense:  90 tablet    Refill:  3  . canagliflozin (INVOKANA) 100 MG TABS tablet    Sig: Take 1 tablet (100 mg total) by mouth daily.    Dispense:  90 tablet    Refill:  3  . Insulin Glargine (LANTUS SOLOSTAR) 100 UNIT/ML Solostar Pen    Sig: ADMINISTER 30 UNITS UNDER THE SKIN DAILY in the morning    Dispense:  15 mL    Refill:  3  . valsartan-hydrochlorothiazide (DIOVAN-HCT) 80-12.5 MG tablet    Sig: Take 1 tablet by mouth daily.    Dispense:  90 tablet    Refill:  [place], DO PGY-3, [facility] 05/17/2019 1:12 PM

## 2019-05-17 NOTE — Assessment & Plan Note (Signed)
Obtained urine microalbumin today.  Initiated SGLT2 inhibitor for further renal protection.

## 2019-05-17 NOTE — Assessment & Plan Note (Addendum)
Continues to be low normal.  Will decrease valsartan-HCTZ to 80-12.5 mg.  Obtained urine microalbumin today.  Follow-up in 1 month.

## 2019-05-17 NOTE — Patient Instructions (Addendum)
It was great to see you!  Our plans for today:  - We started a new medicine for your diabetes to help protect your kidneys.  Decrease your Lantus to 30 units daily. - We decreased your valsartan-HCTZ to 80-12.5 mg as her blood pressure continues to be low. - Come back in 1 week for a lab appointment for repeat blood work.  - Come back in 1 month for follow-up.  Take care and seek immediate care sooner if you develop any concerns.   Dr. [facility]

## 2019-06-11 ENCOUNTER — Other Ambulatory Visit: Payer: Self-pay | Admitting: Family Medicine

## 2019-06-11 DIAGNOSIS — Z794 Long term (current) use of insulin: Secondary | ICD-10-CM

## 2019-06-11 DIAGNOSIS — E1100 Type 2 diabetes mellitus with hyperosmolarity without nonketotic hyperglycemic-hyperosmolar coma (NKHHC): Secondary | ICD-10-CM

## 2019-06-14 ENCOUNTER — Ambulatory Visit
Admission: RE | Admit: 2019-06-14 | Discharge: 2019-06-14 | Disposition: A | Discharge status: Home / Self Care | Payer: Medicaid Other | Source: Ambulatory Visit | Attending: Family Medicine | Admitting: Family Medicine

## 2019-06-14 ENCOUNTER — Other Ambulatory Visit: Payer: Self-pay

## 2019-06-14 DIAGNOSIS — E1100 Type 2 diabetes mellitus with hyperosmolarity without nonketotic hyperglycemic-hyperosmolar coma (NKHHC): Secondary | ICD-10-CM

## 2019-06-14 DIAGNOSIS — Z794 Long term (current) use of insulin: Secondary | ICD-10-CM

## 2019-06-28 ENCOUNTER — Other Ambulatory Visit: Payer: Self-pay | Admitting: *Deleted

## 2019-06-28 MED ORDER — PEN NEEDLES 31G X 8 MM MISC: 1.0000 | Freq: Every day | 2 refills | Status: DC

## 2019-06-28 NOTE — Telephone Encounter (Signed)
Please let patient know she is due for followup for her diabetes and blood pressure since we made changes at last visit.

## 2019-06-29 NOTE — Telephone Encounter (Signed)
Patient informed and appt made for 07/14/2019. [name] Hartsell,CMA

## 2019-07-01 ENCOUNTER — Other Ambulatory Visit: Payer: Self-pay | Admitting: Family Medicine

## 2019-07-02 NOTE — Telephone Encounter (Signed)
That would not be a safe option for her given her blood pressures have been low previously. If she is having significant swelling and this is new, she should likely be seen as it's been a few months since we last adjusted her medication. She can wear compression wraps in the meantime until she can be seen, she can get these from any drug store.

## 2019-07-02 NOTE — Telephone Encounter (Signed)
Spoke with patient and she wants to go back to higher dose of Diovan-HCT. Patient stated she is swelling again and could barely get her shoes on this morning. Please advise.

## 2019-07-05 NOTE — Telephone Encounter (Signed)
Spoke with pt. She stated that the medication mix up was due to pharmacy error. She has not changed anything and is still taking her 320/25mg  tabs of the valsrtan/HCTZ. Pt also states that her feet swelling is better. Salvatore Marvel, CMA

## 2019-07-06 NOTE — Telephone Encounter (Signed)
Extensively discussed medication regimen with patient in telephone encounter 8/7 with subsequent decreases in dose at visits on 8/18 and 9/28 due to low BP, at that time had not demonstrated any LE swelling. If she feels she is having increased swelling, she needs to be seen for a provider appointment with BP check before further adjustments can be made.

## 2019-07-06 NOTE — Telephone Encounter (Signed)
Spoke with patient and relayed message from provider.  Tried to explain to patient that at the time of her medication dose decrease she was not experiencing leg swelling but was having low bp readings and it was unsafe for her to stay at that current dose.  Told patient that I understand that she is now experiencing swelling and that this is the reason for her to keep her appt on 11-25 with PCP so they can discuss this concern.  Unfortunately we cannot up a medication dose when it decreased initially for safety reasons, now that she is asking [place] to.  Patient seemed to voice understanding even though she was upset that we were trying to tell her what her body was doing.  [name] Hartsell,CMA

## 2019-07-06 NOTE — Telephone Encounter (Signed)
Patient calls nurse line demanding to have higher dose of Diovan-HCT refilled. I informed her her blood pressure readings have been low and this would not be a safe option for her. Patient stated, "Yall don't know my body and I am not going to have my feet swelling up." Patient does not want to wait to discuss this on 11/25. Please advise.

## 2019-07-14 ENCOUNTER — Other Ambulatory Visit: Payer: Self-pay

## 2019-07-14 ENCOUNTER — Ambulatory Visit (INDEPENDENT_AMBULATORY_CARE_PROVIDER_SITE_OTHER): Payer: Medicaid Other | Admitting: Family Medicine

## 2019-07-14 ENCOUNTER — Encounter: Payer: Self-pay | Admitting: Family Medicine

## 2019-07-14 VITALS — BP 148/78 | HR 58 | Temp 98.0°F | Wt 176.0 lb

## 2019-07-14 DIAGNOSIS — I10 Essential (primary) hypertension: Secondary | ICD-10-CM

## 2019-07-14 DIAGNOSIS — N1832 Chronic kidney disease, stage 3b: Secondary | ICD-10-CM | POA: Diagnosis not present

## 2019-07-14 DIAGNOSIS — Z794 Long term (current) use of insulin: Secondary | ICD-10-CM

## 2019-07-14 DIAGNOSIS — E1100 Type 2 diabetes mellitus with hyperosmolarity without nonketotic hyperglycemic-hyperosmolar coma (NKHHC): Secondary | ICD-10-CM | POA: Diagnosis not present

## 2019-07-14 LAB — POCT GLYCOSYLATED HEMOGLOBIN (HGB A1C): HbA1c, POC (controlled diabetic range): 6.8 % (ref 0.0–7.0)

## 2019-07-14 LAB — POCT UA - MICROALBUMIN
Albumin/Creatinine Ratio, Urine, POC: >300
Creatinine, POC: 100 mg/dL
Microalbumin Ur, POC: 150 mg/L

## 2019-07-14 MED ORDER — LANTUS SOLOSTAR 100 UNIT/ML ~~LOC~~ SOPN: PEN_INJECTOR | SUBCUTANEOUS | 3 refills | Status: DC

## 2019-07-14 NOTE — Patient Instructions (Signed)
It was great to see you!  Our plans for today:  -We are decreasing your Lantus to 20 units daily.  We will recheck your A1c in 3 months and then consider adding Invokana. -No other changes to your medications today. -I would recommend following up with the kidney doctors to keep an eye on your kidney disease. -Come back in 3 months.  Take care and seek immediate care sooner if you develop any concerns.   Dr. [facility]

## 2019-07-14 NOTE — Assessment & Plan Note (Signed)
Urine microalbumin obtained today and is again abnormal.  Has previously followed with nephrology and is resistant to following up again.  Discussed risk of progression to ESRD however patient continues to not have great insight into risk of progression of chronic disease.  Thankfully is on ARB given hypertension and diabetes.

## 2019-07-14 NOTE — Assessment & Plan Note (Addendum)
Better controlled, A1c 6.8 today which is technically below goal for her age and has had 1 episode of hypoglycemia.  Invokana on medication list however patient never started this.  She is resistant to trying new medication.  Will decrease Lantus to 20 units today with follow-up in 3 months for A1c check, consider revisiting starting Invokana at that time.  Foot exam performed today and within normal limits.  Microalbumin obtained today and again abnormal.

## 2019-07-14 NOTE — Progress Notes (Signed)
  Subjective:   Patient ID: Kayla Gregory    DOB: [birth year], 58 y.o. female   MRN: [id number]  Kayla Gregory is a 58 y.o. female with a history of stable angina, HTN, allergic rhinitis, GERD, chronic hep C, T2DM, CKD 3 here for   Diabetes, Type 2 - Last A1c 7.7 03/2019 - Medications: Invokana 100 mg daily, Lantus 30 units daily - Compliance: hasn't been taking invokana - Checking BG at home: yes, 107, 173 highest. One was 65. - Diet: 3 meals per day, "watches her sugars" - Eye exam: UTD - Foot exam: Due - Microalbumin: due - Statin: yes - Denies polyuria, polydipsia, numbness extremities, foot ulcers/trauma - Did have one episode of hypoglycemia with shakiness.  Hypertension: - Medications: Valsartan-HCTZ 80-12.5 mg daily - Compliance: good - Checking BP at home: yes, 120/60 - Denies any SOB, CP, vision changes, LE edema, medication SEs, or symptoms of hypotension  CKD 3 - Previously seen by nephrology, last seen 2019 - Renal ultrasound 05/2018 with medical renal disease and 1.8 cm complex cyst in the right kidney - Urine microalbumin 2019 P/C ratio 6283 - Denies any difficulty urinating, flank pain, blood in urine  Review of Systems:  Per HPI.  Medications and smoking status reviewed.  Objective:   BP (!) 148/78   Pulse (!) 58   Temp 98 F (36.7 C) (Oral)   Wt 176 lb (79.8 kg)   SpO2 96%   BMI 34.37 kg/m  Vitals and nursing note reviewed.  General: Obese female, in no acute distress with non-toxic appearance CV: regular rate and rhythm without murmurs, rubs, or gallops, no lower extremity edema Lungs: Distant breath sounds, normal work of breathing on room air Skin: warm, dry, no rashes or lesions Extremities: warm and well perfused, normal tone.  Foot exam normal  Assessment & Plan:   Essential hypertension At goal today.  No changes made  DM (diabetes mellitus) ([place]) Better controlled, A1c 6.8 today which is technically below goal for her age and has had  1 episode of hypoglycemia.  Invokana on medication list however patient never started this.  She is resistant to trying new medication.  Will decrease Lantus to 20 units today with follow-up in 3 months for A1c check, consider revisiting starting Invokana at that time.  Foot exam performed today and within normal limits.  Microalbumin obtained today and again abnormal.  CKD (chronic kidney disease) stage 3, GFR 30-59 ml/min (HCC) Urine microalbumin obtained today and is again abnormal.  Has previously followed with nephrology and is resistant to following up again.  Discussed risk of progression to ESRD however patient continues to not have great insight into risk of progression of chronic disease.  Thankfully is on ARB given hypertension and diabetes.  Orders Placed This Encounter  Procedures  . HgB A1c  . POCT UA - Microalbumin   Meds ordered this encounter  Medications  . Insulin Glargine (LANTUS SOLOSTAR) 100 UNIT/ML Solostar Pen    Sig: ADMINISTER 20 UNITS UNDER THE SKIN DAILY in the morning    Dispense:  15 mL    Refill:  [place], DO PGY-3, [facility] 07/15/2019 8:37 AM

## 2019-07-14 NOTE — Assessment & Plan Note (Signed)
At goal today.  No changes made

## 2019-07-17 ENCOUNTER — Other Ambulatory Visit: Payer: Self-pay

## 2019-07-17 ENCOUNTER — Emergency Department (HOSPITAL_COMMUNITY): Payer: Medicaid Other

## 2019-07-17 ENCOUNTER — Emergency Department (HOSPITAL_COMMUNITY)
Admission: EM | Admit: 2019-07-17 | Discharge: 2019-07-17 | Disposition: A | Discharge status: Home / Self Care | Payer: Medicaid Other | Attending: Emergency Medicine | Admitting: Emergency Medicine

## 2019-07-17 DIAGNOSIS — Z87891 Personal history of nicotine dependence: Secondary | ICD-10-CM | POA: Diagnosis not present

## 2019-07-17 DIAGNOSIS — Z79899 Other long term (current) drug therapy: Secondary | ICD-10-CM | POA: Diagnosis not present

## 2019-07-17 DIAGNOSIS — Z96642 Presence of left artificial hip joint: Secondary | ICD-10-CM | POA: Insufficient documentation

## 2019-07-17 DIAGNOSIS — E1122 Type 2 diabetes mellitus with diabetic chronic kidney disease: Secondary | ICD-10-CM | POA: Diagnosis not present

## 2019-07-17 DIAGNOSIS — I129 Hypertensive chronic kidney disease with stage 1 through stage 4 chronic kidney disease, or unspecified chronic kidney disease: Secondary | ICD-10-CM | POA: Diagnosis not present

## 2019-07-17 DIAGNOSIS — N183 Chronic kidney disease, stage 3 unspecified: Secondary | ICD-10-CM | POA: Diagnosis not present

## 2019-07-17 DIAGNOSIS — M79671 Pain in right foot: Secondary | ICD-10-CM | POA: Diagnosis not present

## 2019-07-17 DIAGNOSIS — Z7982 Long term (current) use of aspirin: Secondary | ICD-10-CM | POA: Insufficient documentation

## 2019-07-17 DIAGNOSIS — Z794 Long term (current) use of insulin: Secondary | ICD-10-CM | POA: Diagnosis not present

## 2019-07-17 DIAGNOSIS — S92501A Displaced unspecified fracture of right lesser toe(s), initial encounter for closed fracture: Secondary | ICD-10-CM

## 2019-07-17 MED ORDER — NAPROXEN 250 MG PO TABS
500.0000 mg | ORAL_TABLET | Freq: Once | ORAL | Status: AC
  Administered 2019-07-17: 500 mg via ORAL
  Filled 2019-07-17: qty 2

## 2019-07-17 MED ORDER — HYDROCODONE-ACETAMINOPHEN 5-325 MG PO TABS: 1.0000 | ORAL_TABLET | Freq: Four times a day (QID) | ORAL | 0 refills | Status: AC | PRN

## 2019-07-17 NOTE — Progress Notes (Signed)
Orthopedic Tech Progress Note Patient Details:  Kayla Gregory [birth year] [id number]  Ortho Devices Type of Ortho Device: Postop shoe/boot Ortho Device/Splint Location: right Ortho Device/Splint Interventions: Application   Post Interventions Patient Tolerated: Well Instructions Provided: Care of device   [name] 07/17/2019, 1:42 PM

## 2019-07-17 NOTE — Discharge Instructions (Signed)
You are seen today for an injury to your foot. It looks like you broke your toe on the right side.  You should wear the postop shoe that we gave you while walking.  We have prescribed you medication for pain.  This can cause dizziness, drowsiness, addiction or overdose even if taken as prescribed.  Be careful with this medication.  Please follow-up with your podiatrist.

## 2019-07-17 NOTE — ED Triage Notes (Signed)
States 3 days ago jammed her foot into a chair, injuring 4th toe.  C/o pain continuing today with throbbing.  Pt is diabetic and is concerned, has also had surgery to 5th and great toe on same foot.  No wounds, edema or bruising noted.

## 2019-07-17 NOTE — ED Provider Notes (Signed)
[facility] EMERGENCY DEPARTMENT Provider Note   CSN: [id number] Arrival date & time: 07/17/19  1113     History   Chief Complaint No chief complaint on file.   HPI Kayla Gregory is a 58 y.o. female.     Patient is a 58 year old female with past medical history of diabetes, hepatitis C, arthritis, schizophrenia, CKD presenting to the emergency department for right foot pain.  Patient reports that on Tuesday she accidentally kicked the side of a chair with her lateral right foot.  Reports pain and swelling since then.  She is concerned because she is a diabetic.  Denies any skin changes or ulcerations.  Previous surgery on this foot in the past for bunion.     Past Medical History:  Diagnosis Date  . Arthritis   . Diabetes mellitus without complication ([place]) 4166  . Environmental and seasonal allergies   . GERD (gastroesophageal reflux disease)   . Hepatitis C, chronic ([place])    treated  . Hyperlipidemia associated with type 2 diabetes mellitus ([place])   . Hypertension associated with diabetes ([place])   . Myogenic ptosis of left eyelid   . Schizophrenia [facility])     Patient Active Problem List   Diagnosis Date Noted  . Eye swelling, bilateral 04/27/2019  . CKD (chronic kidney disease) stage 3, GFR 30-59 ml/min 04/06/2019  . Allergic rhinitis 12/14/2018  . Non-pitting edema 04/29/2018  . Urticaria 11/20/2017  . Chronic left shoulder pain 12/27/2016  . Cough 10/09/2016  . Trigger finger 10/07/2016  . Referred otalgia of right ear 03/31/2016  . Partial thickness burn of ankle 02/02/2016  . Stable angina ([place]) 12/22/2015  . GERD (gastroesophageal reflux disease) 12/22/2015  . Chronic hepatitis C ([place]) 12/22/2015  . DM (diabetes mellitus) ([place]) 03/31/2014  . Essential hypertension 03/31/2014    Past Surgical History:  Procedure Laterality Date  . CATARACT EXTRACTION W/ INTRAOCULAR LENS  IMPLANT, BILATERAL    . COLONOSCOPY    . JOINT REPLACEMENT Left     Hip  . PTOSIS REPAIR Left 12/25/2017   Procedure: INTERNAL PTOSIS REPAIR LEFT EYE;  Surgeon: Clista Bernhardt, MD;  Location: [facility];  Service: Ophthalmology;  Laterality: Left;  . TONSILLECTOMY       OB History   No obstetric history on file.      Home Medications    Prior to Admission medications   Medication Sig Start Date End Date Taking? Authorizing Provider  Accu-Chek Softclix Lancets lancets Use as instructed 01/13/19   Diallo, Earna Coder, MD  aspirin (ASPIRIN 81) 81 MG EC tablet Take 1 tablet (81 mg total) by mouth daily. 06/22/18   Diallo, Earna Coder, MD  atorvastatin (LIPITOR) 40 MG tablet Take 1 tablet (40 mg total) by mouth daily at 6 PM. 05/17/19   Rory Percy, DO  Blood Glucose Monitoring Suppl (ACCU-CHEK AVIVA PLUS) w/Device KIT 1 application by Does not apply route 4 (four) times daily -  with meals and at bedtime. 11/23/18   Diallo, Earna Coder, MD  cetirizine (ZYRTEC) 10 MG tablet Take 1 tablet (10 mg total) by mouth daily. 01/13/19   Diallo, Earna Coder, MD  diphenhydrAMINE (BENADRYL) 25 MG tablet Take 1 tablet (25 mg total) by mouth every 6 (six) hours as needed for allergies. 06/22/18   Diallo, Earna Coder, MD  FLUoxetine (PROZAC) 20 MG capsule Take 1 capsule (20 mg total) by mouth daily. 06/22/18   Diallo, Earna Coder, MD  gabapentin (NEURONTIN) 300 MG capsule Take 1 capsule (300 mg total) by  mouth 3 (three) times daily. 06/22/18   Diallo, Earna Coder, MD  glucose blood (ACCU-CHEK AVIVA PLUS) test strip USE THREE TIMES DAILY 06/12/19   Rory Percy, DO  HYDROcodone-acetaminophen (NORCO/VICODIN) 5-325 MG tablet Take 1 tablet by mouth every 6 (six) hours as needed for up to 3 days. 07/17/19 07/20/19  Alveria Apley, PA-C  hydrocortisone 1 % ointment Apply 1 application topically 2 (two) times daily as needed for itching. 06/22/18   Diallo, Earna Coder, MD  Insulin Glargine (LANTUS SOLOSTAR) 100 UNIT/ML Solostar Pen ADMINISTER 20 UNITS UNDER THE SKIN DAILY in the morning 07/14/19    Rory Percy, DO  Insulin Pen Needle (PEN NEEDLES) 31G X 8 MM MISC 1 each by Does not apply route daily. 06/28/19   Rory Percy, DO  Lancet Devices [facility]) lancets Use as instructed Patient not taking: Reported on 12/18/2017 11/22/16   Diallo, Earna Coder, MD  pantoprazole (PROTONIX) 40 MG tablet Take 1 tablet (40 mg total) by mouth daily. 11/25/18   Diallo, Earna Coder, MD  Propylene Glycol (SYSTANE COMPLETE) 0.6 % SOLN Place 2-3 drops into both eyes daily as needed (for dry eyes).     [provider]  tobramycin (TOBREX) 0.3 % ophthalmic solution INT 1 GTT IN OU FOUR TIMES DAILY. BEGIN 1 DAY B TREATMENT. CONT THE DAY OF TREATMENT AND 1 FULL DAY AFTER TREATMENT 03/26/19   [provider]  valsartan-hydrochlorothiazide (DIOVAN-HCT) 80-12.5 MG tablet Take 1 tablet by mouth daily. 05/17/19   Rory Percy, DO  vitamin B-12 (CYANOCOBALAMIN) 1000 MCG tablet Take 1,000 mcg by mouth daily.    [provider]  ziprasidone (GEODON) 20 MG capsule Take 1 capsule (20 mg total) by mouth daily. 06/22/18   Marjie Skiff, MD    Family History Family History  Problem Relation Age of Onset  . Heart attack Sister 5  . Heart attack Brother 95  . Heart attack Mother 69    Social History Social History   Tobacco Use  . Smoking status: Former Research scientist (life sciences)  . Smokeless tobacco: Never Used  . Tobacco comment: stopped smoking cigareetes in 2001  Substance Use Topics  . Alcohol use: No    Alcohol/week: 0.0 standard drinks    Comment: quit smoking 2001  . Drug use: No    Comment: past crack and marijuana quit in 2001     Allergies   Patient has no known allergies.   Review of Systems Review of Systems  Constitutional: Negative for appetite change, chills and fever.  Respiratory: Negative for cough and shortness of breath.   Musculoskeletal: Positive for arthralgias and joint swelling. Negative for back pain, gait problem, myalgias, neck pain and neck stiffness.   Skin: Negative for rash and wound.  Neurological: Negative for dizziness and light-headedness.  Hematological: Does not bruise/bleed easily.     Physical Exam Updated Vital Signs BP (!) 180/73 (BP Location: Right Arm)   Pulse 62   Temp 98.8 F (37.1 C) (Oral)   Resp 16   Ht _0  (1.549 m)   Wt 78.9 kg   SpO2 98%   BMI 32.88 kg/m   Physical Exam Vitals signs and nursing note reviewed.  Constitutional:      Appearance: Normal appearance.  HENT:     Head: Normocephalic.  Eyes:     Conjunctiva/sclera: Conjunctivae normal.  Cardiovascular:     Pulses:          Dorsalis pedis pulses are 2+ on the right side and 2+ on the left side.  Posterior tibial pulses are 2+ on the right side and 2+ on the left side.  Pulmonary:     Effort: Pulmonary effort is normal.  Musculoskeletal:     Right foot: Normal range of motion. No bunion.     Left foot: Normal range of motion. No bunion.  Feet:     Right foot:     Skin integrity: Skin integrity normal.     Left foot:     Skin integrity: Skin integrity normal.     Comments: Mild swelling and tenderness to palpation to the distal lateral right foot no bruising.  Skin:    General: Skin is dry.  Neurological:     Mental Status: She is alert.  Psychiatric:        Mood and Affect: Mood normal.      ED Treatments / Results  Labs (all labs ordered are listed, but only abnormal results are displayed) Labs Reviewed - No data to display  EKG None  Radiology Dg Foot Complete Right  Result Date: 07/17/2019 CLINICAL DATA:  Pain after hitting foot against solid object EXAM: RIGHT FOOT COMPLETE - 3+ VIEW COMPARISON:  None. FINDINGS: Frontal, oblique, and lateral views were obtained. There is no obliquely oriented fracture of the proximal portion of the fourth proximal phalanx. Alignment in this area is essentially anatomic. No other evident fracture. No dislocation. There is postoperative change in the first metatarsal with bony  remodeling in this area. There is moderate osteoarthritic change in the first MTP joint. Other joint spaces appear unremarkable. There is a small inferior calcaneal spur. There is calcification in the distal posterior tibial artery. IMPRESSION: Nondisplaced obliquely oriented fracture of the proximal aspect of fourth proximal phalanx. No other acute fracture. No dislocation. Postoperative change with remodeling in the first metatarsal. Moderate osteoarthritic change noted in the first MTP joint. Small calcaneal spur. Posterior tibial artery atherosclerosis. Electronically Signed   By: Lowella Grip III M.D.   On: 07/17/2019 13:06    Procedures Procedures (including critical care time)  Medications Ordered in ED Medications  naproxen (NAPROSYN) tablet 500 mg (500 mg Oral Given 07/17/19 1207)     Initial Impression / Assessment and Plan / ED Course  I have reviewed the triage vital signs and the nursing notes.  Pertinent labs & imaging results that were available during my care of the patient were reviewed by me and considered in my medical decision making (see chart for details).  Clinical Course as of Jul 16 1348  Sat Jul 17, 2019  1348 Patient seen for mechanical injury to the right foot about 4 days ago.  Patient have nondisplaced fracture of the fourth phalanx.  She was put in a postop shoe.  She reports that she has a podiatrist that she sees and will get in contact with him.  She requested narcotic pain medication for the pain.  Advised on the precautions with this medication.  She agrees.   [KM]    Clinical Course User Index [KM] Alveria Apley, PA-C       Based on review of vitals, medical screening exam, lab work and/or imaging, there does not appear to be an acute, emergent etiology for the patient's symptoms. Counseled pt on good return precautions and encouraged both PCP and ED follow-up as needed.  Prior to discharge, I also discussed incidental imaging findings with  patient in detail and advised appropriate, recommended follow-up in detail.  Clinical Impression: No diagnosis found.  Disposition: Discharge  Prior to  providing a prescription for a controlled substance, I independently reviewed the patient's recent prescription history on the [facility]. The patient had no recent or regular prescriptions and was deemed appropriate for a brief, less than 3 day prescription of narcotic for acute analgesia.  This note was prepared with assistance of Systems analyst. Occasional wrong-word or sound-a-like substitutions may have occurred due to the inherent limitations of voice recognition software.   Final Clinical Impressions(s) / ED Diagnoses   Final diagnoses:  None    ED Discharge Orders         Ordered    HYDROcodone-acetaminophen (NORCO/VICODIN) 5-325 MG tablet  Every 6 hours PRN     07/17/19 1347           Kristine Royal 07/17/19 1349    Carmin Muskrat, MD 07/17/19 1357

## 2019-07-17 NOTE — ED Notes (Signed)
Patient transported to X-ray 

## 2019-08-02 ENCOUNTER — Ambulatory Visit: Payer: Medicaid Other | Admitting: Podiatry

## 2019-08-02 ENCOUNTER — Other Ambulatory Visit: Payer: Self-pay

## 2019-08-02 ENCOUNTER — Ambulatory Visit (INDEPENDENT_AMBULATORY_CARE_PROVIDER_SITE_OTHER): Payer: Medicaid Other

## 2019-08-02 DIAGNOSIS — S92511D Displaced fracture of proximal phalanx of right lesser toe(s), subsequent encounter for fracture with routine healing: Secondary | ICD-10-CM | POA: Diagnosis not present

## 2019-08-02 DIAGNOSIS — S92504A Nondisplaced unspecified fracture of right lesser toe(s), initial encounter for closed fracture: Secondary | ICD-10-CM

## 2019-08-02 DIAGNOSIS — S92512A Displaced fracture of proximal phalanx of left lesser toe(s), initial encounter for closed fracture: Secondary | ICD-10-CM

## 2019-08-02 MED ORDER — MELOXICAM 15 MG PO TABS: 15.0000 mg | ORAL_TABLET | Freq: Every day | ORAL | 1 refills | Status: DC

## 2019-08-05 NOTE — Progress Notes (Signed)
   HPI: 58 y.o. female presenting today with a chief complaint of throbbing pain noted to the right fourth digit that began three weeks ago after sustaining an injury. She states she ran her foot into a recliner. Walking and standing increases her pain. She has been taking OTC pain medication for treatment. Patient is here for further evaluation and treatment.   Past Medical History:  Diagnosis Date  . Arthritis   . Diabetes mellitus without complication ([place]) [id number]  . Environmental and seasonal allergies   . GERD (gastroesophageal reflux disease)   . Hepatitis C, chronic ([place])    treated  . Hyperlipidemia associated with type 2 diabetes mellitus ([place])   . Hypertension associated with diabetes ([place])   . Myogenic ptosis of left eyelid   . Schizophrenia [facility])      Physical Exam: General: The patient is alert and oriented x3 in no acute distress.  Dermatology: Skin is warm, dry and supple bilateral lower extremities. Negative for open lesions or macerations.  Vascular: Palpable pedal pulses bilaterally. No edema or erythema noted. Capillary refill within normal limits.  Neurological: Epicritic and protective threshold grossly intact bilaterally.   Musculoskeletal Exam: Pain with palpation noted to the proximal phalanx of the right fourth toe. Range of motion within normal limits to all pedal and ankle joints bilateral. Muscle strength 5/5 in all groups bilateral.   Radiographic Exam:  Closed, nondisplaced fracture noted to the proximal phalanx of the right fourth digit.    Assessment: 1. Closed, nondisplaced fracture proximal phalanx right 4th toe   Plan of Care:  1. Patient evaluated. X-Rays reviewed.  2. Post op shoe dispensed.  3. Prescription for Meloxicam provided to patient. 4. Return to clinic in 6 weeks for follow up X-Ray.       Edrick Kins, DPM [facility]  Dr. Edrick Kins, DPM    [address], [place] [address]                Office [phone]  Fax [phone]

## 2019-08-06 ENCOUNTER — Telehealth: Payer: Self-pay | Admitting: Podiatry

## 2019-08-06 NOTE — Telephone Encounter (Signed)
Hey Dr Amalia Hailey, Please advise? Thanks Lattie Haw

## 2019-08-06 NOTE — Telephone Encounter (Signed)
Pt was seen in office on 12/14 for a broken toe and states the Meloxicam is not helping with the pain. Pt would like to know if the doctor can prescribe a different medication.  Please give pt a call.   [facility] [address]

## 2019-08-11 ENCOUNTER — Telehealth: Payer: Self-pay | Admitting: Podiatry

## 2019-08-11 MED ORDER — TRAMADOL HCL 50 MG PO TABS: 50.0000 mg | ORAL_TABLET | Freq: Three times a day (TID) | ORAL | 0 refills | Status: AC | PRN

## 2019-08-11 NOTE — Telephone Encounter (Signed)
Thank you! - Dr. Shacoria Latif

## 2019-08-11 NOTE — Telephone Encounter (Signed)
Dr. Amalia Hailey ordered Tramadol 50mg  #30 one tablet every 8 hour for pain. Orders to pt and called to the pharmacy.

## 2019-08-11 NOTE — Addendum Note (Signed)
Addended by: Harriett Sine D on: 08/11/2019 04:38 PM   Modules accepted: Orders

## 2019-08-11 NOTE — Telephone Encounter (Signed)
Pt called to say that the meloxicam was not working for her pain level can she have something stronger for the pain can we call it in to [address] [facility]

## 2019-08-16 LAB — HM DIABETES EYE EXAM

## 2019-08-21 ENCOUNTER — Other Ambulatory Visit: Payer: Self-pay | Admitting: Family Medicine

## 2019-08-21 DIAGNOSIS — Z794 Long term (current) use of insulin: Secondary | ICD-10-CM

## 2019-08-21 DIAGNOSIS — E1100 Type 2 diabetes mellitus with hyperosmolarity without nonketotic hyperglycemic-hyperosmolar coma (NKHHC): Secondary | ICD-10-CM

## 2019-09-06 ENCOUNTER — Ambulatory Visit: Payer: Medicaid Other | Admitting: Podiatry

## 2019-09-10 ENCOUNTER — Ambulatory Visit: Payer: Medicaid Other | Attending: Internal Medicine

## 2019-09-10 DIAGNOSIS — Z23 Encounter for immunization: Secondary | ICD-10-CM

## 2019-09-13 ENCOUNTER — Other Ambulatory Visit: Payer: Self-pay

## 2019-09-13 ENCOUNTER — Ambulatory Visit: Payer: Medicaid Other | Admitting: Podiatry

## 2019-09-13 ENCOUNTER — Ambulatory Visit (INDEPENDENT_AMBULATORY_CARE_PROVIDER_SITE_OTHER): Payer: Medicaid Other

## 2019-09-13 DIAGNOSIS — M79676 Pain in unspecified toe(s): Secondary | ICD-10-CM

## 2019-09-13 DIAGNOSIS — M7751 Other enthesopathy of right foot: Secondary | ICD-10-CM

## 2019-09-13 DIAGNOSIS — S92504A Nondisplaced unspecified fracture of right lesser toe(s), initial encounter for closed fracture: Secondary | ICD-10-CM

## 2019-09-13 DIAGNOSIS — B351 Tinea unguium: Secondary | ICD-10-CM

## 2019-09-13 DIAGNOSIS — E0843 Diabetes mellitus due to underlying condition with diabetic autonomic (poly)neuropathy: Secondary | ICD-10-CM

## 2019-09-13 NOTE — Progress Notes (Signed)
   Covid-19 Vaccination Clinic  Name:  Kayla Gregory    MRN: [id number] DOB: [birth year]  09/10/2019  Kayla Gregory was observed post Covid-19 immunization for 15 minutes without incidence. She was provided with Vaccine Information Sheet and instruction to access the V-Safe system.   Kayla Gregory was instructed to call 911 with any severe reactions post vaccine: Marland Kitchen Difficulty breathing  . Swelling of your face and throat  . A fast heartbeat  . A bad rash all over your body  . Dizziness and weakness    Immunizations Administered    Name Date Dose VIS Date Route   Moderna COVID-19 Vaccine 09/10/2019 11:32 AM 0.5 mL 07/20/2019 Intramuscular   Manufacturer: Moderna   Lot: [id number]   [id number]

## 2019-09-15 ENCOUNTER — Encounter (HOSPITAL_COMMUNITY): Payer: Self-pay | Admitting: Emergency Medicine

## 2019-09-15 ENCOUNTER — Ambulatory Visit (HOSPITAL_COMMUNITY)
Admission: EM | Admit: 2019-09-15 | Discharge: 2019-09-15 | Disposition: A | Discharge status: Home / Self Care | Payer: Medicaid Other | Attending: Family Medicine | Admitting: Family Medicine

## 2019-09-15 ENCOUNTER — Other Ambulatory Visit: Payer: Self-pay

## 2019-09-15 DIAGNOSIS — Z87891 Personal history of nicotine dependence: Secondary | ICD-10-CM | POA: Diagnosis not present

## 2019-09-15 DIAGNOSIS — Z9842 Cataract extraction status, left eye: Secondary | ICD-10-CM | POA: Insufficient documentation

## 2019-09-15 DIAGNOSIS — Z96642 Presence of left artificial hip joint: Secondary | ICD-10-CM | POA: Diagnosis not present

## 2019-09-15 DIAGNOSIS — I10 Essential (primary) hypertension: Secondary | ICD-10-CM | POA: Diagnosis not present

## 2019-09-15 DIAGNOSIS — Z961 Presence of intraocular lens: Secondary | ICD-10-CM | POA: Insufficient documentation

## 2019-09-15 DIAGNOSIS — R05 Cough: Secondary | ICD-10-CM | POA: Diagnosis not present

## 2019-09-15 DIAGNOSIS — R0981 Nasal congestion: Secondary | ICD-10-CM | POA: Diagnosis not present

## 2019-09-15 DIAGNOSIS — Z9841 Cataract extraction status, right eye: Secondary | ICD-10-CM | POA: Insufficient documentation

## 2019-09-15 DIAGNOSIS — Z20822 Contact with and (suspected) exposure to covid-19: Secondary | ICD-10-CM | POA: Diagnosis not present

## 2019-09-15 DIAGNOSIS — Z8249 Family history of ischemic heart disease and other diseases of the circulatory system: Secondary | ICD-10-CM | POA: Insufficient documentation

## 2019-09-15 DIAGNOSIS — R059 Cough, unspecified: Secondary | ICD-10-CM

## 2019-09-15 NOTE — ED Triage Notes (Signed)
Pt states she got the covid vaccine on January 19.  Pt states a few days after that she started having nasal congestion and a cough.

## 2019-09-15 NOTE — Discharge Instructions (Addendum)
You have been tested for COVID-19 today. If your test returns positive, you will receive a phone call from [facility] regarding your results. Negative test results are not called. Both positive and negative results area always visible on MyChart. If you do not have a MyChart account, sign up instructions are provided in your discharge papers. Please do not hesitate to contact us should you have questions or concerns.  Your blood pressure was noted to be elevated during your visit today. You may return here within the next few days to recheck if unable to see your primary care doctor. Ensure you are taking your blood pressure medicine as directed.  BP (!) 172/82 (BP Location: Left Arm)   Pulse 62   Temp 98 F (36.7 C) (Oral)   Resp 16   SpO2 100%

## 2019-09-15 NOTE — Progress Notes (Signed)
   SUBJECTIVE 59 year old female with a history of diabetes mellitus presents to office today for follow up evaluation of a fractured right fourth toe. She states she is doing well and reports only minor swelling. She denies any pain. She has been using the post op shoe daily. There are no modifying factors noted.  She is also complaining of elongated, thickened nails that cause pain while ambulating in shoes. She is unable to trim her own nails. Patient is here for further evaluation and treatment.   Past Medical History:  Diagnosis Date  . Arthritis   . Diabetes mellitus without complication ([place]) [id number]  . Environmental and seasonal allergies   . GERD (gastroesophageal reflux disease)   . Hepatitis C, chronic ([place])    treated  . Hyperlipidemia associated with type 2 diabetes mellitus ([place])   . Hypertension associated with diabetes ([place])   . Myogenic ptosis of left eyelid   . Schizophrenia ([place])     OBJECTIVE General Patient is awake, alert, and oriented x 3 and in no acute distress. Derm Skin is dry and supple bilateral. Negative open lesions or macerations. Remaining integument unremarkable. Nails are tender, long, thickened and dystrophic with subungual debris, consistent with onychomycosis, 1-5 bilateral. No signs of infection noted. Vasc  DP and PT pedal pulses palpable bilaterally. Temperature gradient within normal limits.  Neuro Epicritic and protective threshold sensation diminished bilaterally.  Musculoskeletal Exam Pain with palpation noted to the proximal phalanx of the right fourth toe. Range of motion within normal limits to all pedal and ankle joints bilateral. Muscle strength 5/5 in all groups bilateral.   Radiographic Exam:  Closed, nondisplaced fracture noted to the proximal phalanx of the right fourth digit with routine healing.   ASSESSMENT 1. Diabetes Mellitus w/ peripheral neuropathy 2. Onychomycosis of nail due to dermatophyte bilateral 3. Closed, nondisplaced  fracture proximal phalanx right 4th toe with routine healing   PLAN OF CARE 1. Patient evaluated today. 2. Instructed to maintain good pedal hygiene and foot care. Stressed importance of controlling blood sugar.  3. Mechanical debridement of nails 1-5 bilaterally performed using a nail nipper. Filed with dremel without incident.  4. Discontinue using post op shoe. Recommended good shoe gear.  5. Return to clinic in 3 mos.     Edrick Kins, DPM [facility]  Dr. Edrick Kins, [place]                                        [place],  [address]                Office [phone]  Fax [phone]

## 2019-09-15 NOTE — ED Provider Notes (Signed)
[place] [facility]   [id number] 09/15/19 Arrival Time: [id number]  ASSESSMENT & PLAN:  1. Cough   2. Nasal congestion   3. Elevated blood pressure reading with diagnosis of hypertension     COVID-19 testing sent. See letter/work note on file for self-isolation guidelines.  Follow-up Information    Rory Percy, DO.   Specialty: Family Medicine Why: If worsening or failing to improve as anticipated. And to recheck your blood pressure. Contact information: [address] [place] [address] [phone]            Discharge Instructions     You have been tested for COVID-19 today. If your test returns positive, you will receive a phone call from [facility] regarding your results. Negative test results are not called. Both positive and negative results area always visible on MyChart. If you do not have a MyChart account, sign up instructions are provided in your discharge papers. Please do not hesitate to contact us should you have questions or concerns.  Your blood pressure was noted to be elevated during your visit today. You may return here within the next few days to recheck if unable to see your primary care doctor. Ensure you are taking your blood pressure medicine as directed.  BP (!) 172/82 (BP Location: Left Arm)    Pulse 62    Temp 98 F (36.7 C) (Oral)    Resp 16    SpO2 100%      No s/s of hypertensive urgency. OTC symptom care as needed.  Reviewed expectations re: course of current medical issues. Questions answered. Outlined signs and symptoms indicating need for more acute intervention. Patient verbalized understanding. After Visit Summary given.   SUBJECTIVE: History from: patient. Kayla Gregory is a 59 y.o. female who requests COVID-19 testing. Known COVID-19 contact: none. Recent travel: none. Reports dry cough and nasal congestion for a few days. Denies: fever, sore throat, difficulty breathing and headache. Normal PO intake without  n/v/d.  Increased blood pressure noted today. Reports that she is treated for HTN. "High when I go to the doctor but my machine tells me it's normal at home".  She reports no chest pain on exertion, no dyspnea on exertion, no swelling of ankles, no orthostatic dizziness or lightheadedness, no orthopnea or paroxysmal nocturnal dyspnea, no palpitations and no intermittent claudication symptoms.    OBJECTIVE:  Vitals:   09/15/19 1124  BP: (!) 172/82  Pulse: 62  Resp: 16  Temp: 98 F (36.7 C)  TempSrc: Oral  SpO2: 100%    General appearance: alert; no distress Eyes: PERRLA; EOMI; conjunctiva normal HENT: ; AT; mild nasal congestion Neck: supple  Lungs: speaks full sentences without difficulty; unlabored Extremities: no edema Skin: warm and dry Neurologic: normal gait Psychological: alert and cooperative; normal mood and affect  Labs:  Labs Reviewed  NOVEL CORONAVIRUS, NAA (HOSP ORDER, SEND-OUT TO REF LAB; TAT 18-24 HRS)     No Known Allergies  Past Medical History:  Diagnosis Date   Arthritis    Diabetes mellitus without complication ([place]) [id number]   Environmental and seasonal allergies    GERD (gastroesophageal reflux disease)    Hepatitis C, chronic ([facility])    treated   Hyperlipidemia associated with type 2 diabetes mellitus ([facility])    Hypertension associated with diabetes ([place])    Myogenic ptosis of left eyelid    Schizophrenia ([place])    Social History   Socioeconomic History   Marital status: Single    Spouse name:  Not on file   Number of children: Not on file   Years of education: Not on file   Highest education level: Not on file  Occupational History   Not on file  Tobacco Use   Smoking status: Former Smoker   Smokeless tobacco: Never Used   Tobacco comment: stopped smoking cigareetes in 2001  Substance and Sexual Activity   Alcohol use: No    Alcohol/week: 0.0 standard drinks    Comment: quit smoking 2001   Drug use: No     Comment: past crack and marijuana quit in 2001   Sexual activity: Not on file  Other Topics Concern   Not on file  Social History Narrative   She recently moved from [place] to [place] in 2017. 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   Social Determinants of Health   Financial Resource Strain:    Difficulty of Paying Living Expenses: Not on file  Food Insecurity:    Worried About Charity fundraiser in the Last Year: Not on file   [employer] of Food in the Last Year: Not on file  Transportation Needs:    Lack of Transportation (Medical): Not on file   Lack of Transportation (Non-Medical): Not on file  Physical Activity:    Days of Exercise per Week: Not on file   Minutes of Exercise per Session: Not on file  Stress:    Feeling of Stress : Not on file  Social Connections:    Frequency of Communication with Friends and Family: Not on file   Frequency of Social Gatherings with Friends and Family: Not on file   Attends Religious Services: Not on file   Active Member of Clubs or Organizations: Not on file   Attends Archivist Meetings: Not on file   Marital Status: Not on file  Intimate Partner Violence:    Fear of Current or Ex-Partner: Not on file   Emotionally Abused: Not on file   Physically Abused: Not on file   Sexually Abused: Not on file   Family History  Problem Relation Age of Onset   Heart attack Sister 20   Heart attack Brother 62   Heart attack Mother 48   Past Surgical History:  Procedure Laterality Date   CATARACT EXTRACTION W/ INTRAOCULAR LENS  IMPLANT, BILATERAL     COLONOSCOPY     JOINT REPLACEMENT Left    Hip   PTOSIS REPAIR Left 12/25/2017   Procedure: INTERNAL PTOSIS REPAIR LEFT EYE;  Surgeon: Clista Bernhardt, MD;  Location: [facility];  Service: Ophthalmology;  Laterality: Left;   TONSILLECTOMY       Vanessa Kick, MD 09/15/19 1145

## 2019-09-17 LAB — NOVEL CORONAVIRUS, NAA (HOSP ORDER, SEND-OUT TO REF LAB; TAT 18-24 HRS): SARS-CoV-2, NAA: NOT DETECTED

## 2019-09-20 ENCOUNTER — Telehealth (HOSPITAL_COMMUNITY): Payer: Self-pay | Admitting: Emergency Medicine

## 2019-09-20 NOTE — Telephone Encounter (Signed)
Pt called requesting covid test results. Results reported as negative.

## 2019-10-04 ENCOUNTER — Other Ambulatory Visit: Payer: Self-pay

## 2019-10-04 ENCOUNTER — Ambulatory Visit: Payer: Medicaid Other | Admitting: Family Medicine

## 2019-10-04 ENCOUNTER — Ambulatory Visit (INDEPENDENT_AMBULATORY_CARE_PROVIDER_SITE_OTHER): Payer: Medicaid Other | Admitting: Student in an Organized Health Care Education/Training Program

## 2019-10-04 VITALS — BP 150/68 | HR 59 | Ht 61.0 in | Wt 181.4 lb

## 2019-10-04 DIAGNOSIS — Z794 Long term (current) use of insulin: Secondary | ICD-10-CM

## 2019-10-04 DIAGNOSIS — E1100 Type 2 diabetes mellitus with hyperosmolarity without nonketotic hyperglycemic-hyperosmolar coma (NKHHC): Secondary | ICD-10-CM | POA: Diagnosis not present

## 2019-10-04 DIAGNOSIS — G629 Polyneuropathy, unspecified: Secondary | ICD-10-CM | POA: Insufficient documentation

## 2019-10-04 DIAGNOSIS — G6289 Other specified polyneuropathies: Secondary | ICD-10-CM | POA: Diagnosis not present

## 2019-10-04 LAB — POCT GLYCOSYLATED HEMOGLOBIN (HGB A1C): HbA1c, POC (controlled diabetic range): 7 % (ref 0.0–7.0)

## 2019-10-04 NOTE — Assessment & Plan Note (Signed)
A1c 7.0 today. Patient declined suggestion of second agent. She was accepting of my recommendation to not increase her insulin given her symptomatic episodes of hypoglycemia. She was also open to receiving reading material on other agents and said she would look over them.

## 2019-10-04 NOTE — Progress Notes (Signed)
   CHIEF COMPLAINT / HPI: diabetes check up  Patient presents to discuss her diabetes. Brings in her glucose monitor which she checks every morning. BS range from 52-335 on device. Patient endorses feeling lightheaded when her BS is low. Takes 20u insulin in the morning.  Patient feels strongly that she should have higher insulin dose as her last PCP recommended and isn't happy that her blood sugars have been so high. Given that her BS have been low with symptoms, I would be worried to increase her dose. Discussed adding on another agent that could help decrease blood sugars with less risk of hypoglycemia but patient declined passionately. She does not want to take any new medications.   Peripheral neuropathy- feeling of pins and needles in feet. patient adherent with gabapentin but is requesting a muscle relaxant.   PERTINENT  PMH / PSH: insulin dependent DMII, poorly controlled HTN   OBJECTIVE: BP (!) 150/68   Pulse (!) 59   Ht 5\' 1"  (1.549 m)   Wt 181 lb 6 oz (82.3 kg)   SpO2 97%   BMI 34.27 kg/m   General: NAD, pleasant, able to participate in exam Cardiac: RRR, normal heart sounds, no murmurs. 2+ radial and PT pulses bilaterally Respiratory: CTAB, normal effort, No wheezes, rales or rhonchi Extremities: no edema or cyanosis. WWP. Skin: warm and dry, no rashes noted Neuro: alert and oriented x4, no focal deficits Psych: Normal affect and mood  ASSESSMENT / PLAN:  DM (diabetes mellitus) (HCC) A1c 7.0 today. Patient declined suggestion of second agent. She was accepting of my recommendation to not increase her insulin given her symptomatic episodes of hypoglycemia. She was also open to receiving reading material on other agents and said she would look over them.   Peripheral neuropathy Patient requested muscle relaxant which she has gotten from previous PCP. History did not suggest muscle cramping.  Recommended patient could increase her nighttime dose of gabapentin to 600mg .      [place]

## 2019-10-04 NOTE — Patient Instructions (Addendum)
It was a pleasure to see you today!  To summarize our discussion for this visit:  Your A1c is 7.0 which is higher than last time but since you have had some episodes of hypoglycemia or really low blood sugars, I don't think we should change your insulin. I could recommend another medication to take to help with blood sugars that helps also with weight loss and heart protection but you declined any new medications today. You have agreed to learn more about these other medications so I'm including some reading materials here today.  You can increase your nighttime dose of gabapentin to 685m to help with your peripheral neuropathy.  Please return to our clinic to see [place] needed.  Call the clinic at [phone] your symptoms worsen or you have any concerns.   Thank you for allowing me to take part in your care,  Dr. CDoristine Mango Empagliflozin; Linagliptin; Metformin extended-release tablets What is this medicine? EMPAGLIFLOZIN; LINAGLIPTIN; METFORMIN (EM pa gli FLOE zin; lin a GLIP tin; met FOR min) is a combination of 3 medicines used to treat type 2 diabetes. This medicine lowers blood sugar. Treatment is combined with a balanced diet and exercise. This medicine may be used for other purposes; ask your health care provider or pharmacist if you have questions. COMMON BRAND NAME(S): Trijardy XR What should I tell my health care provider before I take this medicine? They need to know if you have any of these conditions:  anemia  dehydration  diabetic ketoacidosis  diet low in salt  eating less due to illness, surgery, dieting, or any other reason  having surgery  heart disease  heart failure  high cholesterol  history of pancreatitis or pancreas problems  history of yeast infection of the penis or vagina  if you often drink alcohol  infections in the bladder, kidneys, or urinary tract  kidney disease  liver disease  low blood pressure  on  hemodialysis  polycystic ovary syndrome  previous swelling of the tongue, face, or lips with difficulty breathing, difficulty swallowing, hoarseness, or tightening of the throat  problems urinating  serious infection or injury  type 1 diabetes  uncircumcised female  vomiting  an unusual or allergic reaction to empagliflozin, linagliptin, metformin, other medicines, foods, dyes, or preservatives  pregnant or trying to get pregnant  breast-feeding How should I use this medicine? Take this medicine by mouth with a glass of water. Take this medicine in the morning with food. Follow the directions on the prescription label. Do not cut, crush, or chew this medicine. Take your doses at regular intervals. Do not take your medicine more often than directed. Do not stop taking except on your doctor's advice. A special MedGuide will be given to you by the pharmacist with each prescription and refill. Be sure to read this information carefully each time. Talk to your pediatrician regarding the use of this medicine in children. Special care may be needed. Overdosage: If you think you have taken too much of this medicine contact a poison control center or emergency room at once. NOTE: This medicine is only for you. Do not share this medicine with others. What if I miss a dose? If you miss a dose, take it as soon as you can. If it is almost time for your next dose, take only that dose. Do not take double or extra doses. What may interact with this medicine? Do not take this medicine with any of the following medications:  certain  contrast medicines given before X-rays, CT scans, MRI, or other procedures  dofetilide  gatifloxacin This medicine may also interact with the following medications:  acetazolamide  alcohol  bosentan  certain antivirals for HIV or hepatitis  certain medicines for blood pressure, heart disease, irregular heartbeat  certain medicines for seizures like  carbamazepine, phenobarbital, phenytoin  cimetidine  dichlorphenamide  digoxin  diuretics  female hormones, like estrogens or progestins and birth control pills  glycopyrrolate  isoniazid  lamotrigine  memantine  methazolamide  metoclopramide  midodrine  niacin  phenothiazines like chlorpromazine, mesoridazine, prochlorperazine, thioridazine  ranolazine  rifabutin  rifampin  [place] Wort  steroid medicines like prednisone or cortisone  stimulant medicines for attention disorders, weight loss, or to stay awake  thyroid medicines  topiramate  trospium  vandetanib  zonisamide This list may not describe all possible interactions. Give your health care provider a list of all the medicines, herbs, non-prescription drugs, or dietary supplements you use. Also tell them if you smoke, drink alcohol, or use illegal drugs. Some items may interact with your medicine. What should I watch for while using this medicine? Visit your doctor or health care professional for regular checks on your progress. This medicine can cause a serious condition in which there is too much acid in the blood. If you develop nausea, vomiting, stomach pain, unusual tiredness, or breathing problems, stop taking this medicine and call your doctor right away. If possible, use a ketone dipstick to check for ketones in your urine. A test called the HbA1C (A1C) will be monitored. This is a simple blood test. It measures your blood sugar control over the last 2 to 3 months. You will receive this test every 3 to 6 months. Learn how to check your blood sugar. Learn the symptoms of low and high blood sugar and how to manage them. Always carry a quick-source of sugar with you in case you have symptoms of low blood sugar. Examples include hard sugar candy or glucose tablets. Make sure others know that you can choke if you eat or drink when you develop serious symptoms of low blood sugar, such as seizures or  unconsciousness. They must get medical help at once. Tell your doctor or health care professional if you have high blood sugar. You might need to change the dose of your medicine. If you are sick or exercising more than usual, you might need to change the dose of your medicine. Do not skip meals. Ask your doctor or health care professional if you should avoid alcohol. Many nonprescription cough and cold products contain sugar or alcohol. These can affect blood sugar. This medicine may cause ovulation in premenopausal women who do not have regular monthly periods. This may increase your chances of becoming pregnant. You should not take this medicine if you become pregnant or think you may be pregnant. Talk with your doctor or health care professional about your birth control options while taking this medicine. Contact your doctor or health care professional right away if you think you are pregnant. If you are going to need surgery, a MRI, CT scan, or other procedure, tell your doctor that you are taking this medicine. You may need to stop taking this medicine before the procedure. Wear a medical ID bracelet or chain, and carry a card that describes your disease and details of your medicine and dosage times. You may see empty tablets in your stool. This is normal. This medicine may cause a decrease in folic acid and  vitamin B12. You should make sure that you get enough vitamins while you are taking this medicine. Discuss the foods you eat and the vitamins you take with your health care professional. What side effects may I notice from receiving this medicine? Side effects that you should report to your doctor or health care professional as soon as possible:  allergic reactions like skin rash, itching or hives, swelling of the face, lips, or tongue  dizziness  feeling faint or lightheaded, falls  joint pain  muscle weakness  nausea, vomiting, unusual stomach upset or pain  penile discharge,  itching, or pain in men  redness, blistering, peeling or loosening of the skin, including inside the mouth  signs and symptoms of a genital infection, such as fever; tenderness, redness, or swelling in the genitals or area from the genitals to the back of the rectum  signs and symptoms of a urinary tract infection, such as fever, chills, a burning feeling when urinating, blood in the urine, back pain  signs and symptoms of heart failure like breathing problems, fast, irregular heartbeat, sudden weight gain; swelling of the ankles, feet, hands; unusually weak or tired  signs and symptoms of low blood sugar such as feeling anxious, confusion, dizziness, increased hunger, unusually weak or tired, sweating, shakiness, cold, irritable, headache, blurred vision, fast heartbeat, loss of consciousness  signs and symptoms of muscle injury like dark urine; trouble passing urine or change in the amount of urine; unusually weak or tired; muscle pain; back pain  an urgent need to urinate more often, in larger amounts, or at night  unusual stomach upset or pain  vaginal discharge, itching, or odor in women  vomiting Side effects that usually do not require medical attention (report these to your doctor or health care professional if they continue or are bothersome):  diarrhea  headache  mild increase in urination  nausea  sore throat  stuffy or runny nose  thirsty This list may not describe all possible side effects. Call your doctor for medical advice about side effects. You may report side effects to FDA at 1-800-FDA-1088. Where should I keep my medicine? Keep out of the reach of children. Store at room temperature between 15 and 30 degrees C (59 and 86 degrees F). Protect from moisture. Keep container tightly closed. Throw away any unused medicine after the expiration date. NOTE: This sheet is a summary. It may not cover all possible information. If you have questions about this  medicine, talk to your doctor, pharmacist, or health care provider.  2020 Elsevier/Gold Standard (2018-09-18 10:18:03)

## 2019-10-04 NOTE — Assessment & Plan Note (Signed)
Patient requested muscle relaxant which she has gotten from previous PCP. History did not suggest muscle cramping.  Recommended patient could increase her nighttime dose of gabapentin to 600mg .

## 2019-10-08 ENCOUNTER — Ambulatory Visit: Payer: Medicaid Other

## 2019-10-15 ENCOUNTER — Ambulatory Visit: Payer: Medicaid Other | Attending: Internal Medicine

## 2019-10-15 DIAGNOSIS — Z23 Encounter for immunization: Secondary | ICD-10-CM | POA: Insufficient documentation

## 2019-10-15 NOTE — Progress Notes (Signed)
   Covid-19 Vaccination Clinic  Name:  [name]    MRN: [id number] DOB: 1960/11/28  10/15/2019  Kayla Gregory was observed post Covid-19 immunization for 15 minutes without incidence. She was provided with Vaccine Information Sheet and instruction to access the V-Safe system.   Kayla Gregory was instructed to call 911 with any severe reactions post vaccine: Marland Kitchen Difficulty breathing  . Swelling of your face and throat  . A fast heartbeat  . A bad rash all over your body  . Dizziness and weakness    Immunizations Administered    Name Date Dose VIS Date Route   Moderna COVID-19 Vaccine 10/15/2019 11:08 AM 0.5 mL 07/20/2019 Intramuscular   Manufacturer: Moderna   Lot: [id number]   [id number]

## 2019-10-20 ENCOUNTER — Ambulatory Visit: Payer: Medicaid Other | Admitting: Podiatry

## 2019-10-26 ENCOUNTER — Other Ambulatory Visit: Payer: Self-pay | Admitting: Family Medicine

## 2019-10-26 DIAGNOSIS — Z794 Long term (current) use of insulin: Secondary | ICD-10-CM

## 2019-10-26 DIAGNOSIS — E1100 Type 2 diabetes mellitus with hyperosmolarity without nonketotic hyperglycemic-hyperosmolar coma (NKHHC): Secondary | ICD-10-CM

## 2019-11-25 ENCOUNTER — Telehealth: Payer: Self-pay

## 2019-11-25 MED ORDER — ACCU-CHEK SOFTCLIX LANCETS MISC: 12 refills | Status: DC

## 2019-11-25 NOTE — Telephone Encounter (Signed)
Pt calls office requesting refill on lancets. Rx sent into pharmacy.   Talbot Grumbling, RN

## 2019-12-06 ENCOUNTER — Ambulatory Visit: Payer: Medicaid Other | Admitting: Podiatry

## 2019-12-13 ENCOUNTER — Encounter: Payer: Self-pay | Admitting: Podiatry

## 2019-12-13 ENCOUNTER — Ambulatory Visit: Payer: Medicaid Other | Admitting: Podiatry

## 2019-12-13 ENCOUNTER — Other Ambulatory Visit: Payer: Self-pay

## 2019-12-13 VITALS — Temp 97.2°F

## 2019-12-13 DIAGNOSIS — B351 Tinea unguium: Secondary | ICD-10-CM

## 2019-12-13 DIAGNOSIS — M79676 Pain in unspecified toe(s): Secondary | ICD-10-CM

## 2019-12-13 DIAGNOSIS — E0843 Diabetes mellitus due to underlying condition with diabetic autonomic (poly)neuropathy: Secondary | ICD-10-CM | POA: Diagnosis not present

## 2019-12-13 DIAGNOSIS — L600 Ingrowing nail: Secondary | ICD-10-CM

## 2019-12-13 NOTE — Progress Notes (Addendum)
Subjective: Kayla Gregory presents today for follow up of at risk foot care. Pt has h/o NIDDM with chronic kidney disease and painful mycotic nails b/l that are difficult to trim. Pain interferes with ambulation. Aggravating factors include wearing enclosed shoe gear. Pain is relieved with periodic professional debridement.   She relates itching to b/l great toes. States last night she attempted to pull left great toenail and it lifted and cracked a little. Denies any bleeding/pus.   No Known Allergies   Objective: Vitals:   12/13/19 0822  Temp: (!) 97.2 F (36.2 C)    Pt is a pleasant 59 y.o. year old AA female WD, WN in NAD. AAO x 3.   Vascular Examination:  Capillary refill time to digits immediate b/l. Palpable DP pulses b/l. Palpable PT pulses b/l. Pedal hair sparse b/l. Skin temperature gradient within normal limits b/l. No edema noted b/l.  Dermatological Examination: Pedal skin with normal turgor, texture and tone bilaterally. No open wounds bilaterally. No interdigital macerations bilaterally. Toenails 1-5 b/l elongated, dystrophic, thickened, crumbly with subungual debris and tenderness to dorsal palpation. Incurvated nailplate b/l great toes with tenderness to palpation. No erythema, no edema, no drainage noted.  She does have early callus formation left hallux. No erythema, no edema, no drainage, no flocculence.  Musculoskeletal: Normal muscle strength 5/5 to all lower extremity muscle groups bilaterally. No gross bony deformities bilaterally. No pain crepitus or joint limitation noted with ROM b/l. Patient ambulates independent of any assistive aids.  Neurological: Protective sensation intact 5/5 intact bilaterally with 10g monofilament b/l. Vibratory sensation intact b/l. Proprioception intact bilaterally. Babinski reflex negative b/l. Clonus negative b/l.  Assessment: 1. Pain due to onychomycosis of toenail   2. Ingrown toenail without infection   3. Diabetes  mellitus due to underlying condition with diabetic autonomic neuropathy, unspecified whether long term insulin use ([place])    Plan: -Continue diabetic foot care principles. Literature dispensed on today.  -Toenails 1-5 b/l were debrided in length and girth with sterile nail nippers and dremel without iatrogenic bleeding. Offending nail borders debrided and curretaged b/l great toes. Borders cleansed with alcohol. Antibiotic ointment applied. No further treatment required by patient. -Patient advised to apply tea tree oil to toenails once daily for onychomycosis. -Patient advised callus debridement not covered by Medicaid. Instructed her to use pumice stone to area after bath/shower every 2 weeks. -Patient to continue soft, supportive shoe gear daily. -Patient to report any pedal injuries to medical professional immediately. -Patient/POA to call should there be question/concern in the interim.  Return in about 3 months (around 03/13/2020) for diabetic nail trim.

## 2019-12-13 NOTE — Patient Instructions (Addendum)
Apply Tea Tree Oil to each toenail once daily.   Diabetes Mellitus and Foot Care Foot care is an important part of your health, especially when you have diabetes. Diabetes may cause you to have problems because of poor blood flow (circulation) to your feet and legs, which can cause your skin to:  Become thinner and drier.  Break more easily.  Heal more slowly.  Peel and crack. You may also have nerve damage (neuropathy) in your legs and feet, causing decreased feeling in them. This means that you may not notice minor injuries to your feet that could lead to more serious problems. Noticing and addressing any potential problems early is the best way to prevent future foot problems. How to care for your feet Foot hygiene  Wash your feet daily with warm water and mild soap. Do not use hot water. Then, pat your feet and the areas between your toes until they are completely dry. Do not soak your feet as this can dry your skin.  Trim your toenails straight across. Do not dig under them or around the cuticle. File the edges of your nails with an emery board or nail file.  Apply a moisturizing lotion or petroleum jelly to the skin on your feet and to dry, brittle toenails. Use lotion that does not contain alcohol and is unscented. Do not apply lotion between your toes. Shoes and socks  Wear clean socks or stockings every day. Make sure they are not too tight. Do not wear knee-high stockings since they may decrease blood flow to your legs.  Wear shoes that fit properly and have enough cushioning. Always look in your shoes before you put them on to be sure there are no objects inside.  To break in new shoes, wear them for just a few hours a day. This prevents injuries on your feet. Wounds, scrapes, corns, and calluses  Check your feet daily for blisters, cuts, bruises, sores, and redness. If you cannot see the bottom of your feet, use a mirror or ask someone for help.  Do not cut corns or  calluses or try to remove them with medicine.  If you find a minor scrape, cut, or break in the skin on your feet, keep it and the skin around it clean and dry. You may clean these areas with mild soap and water. Do not clean the area with peroxide, alcohol, or iodine.  If you have a wound, scrape, corn, or callus on your foot, look at it several times a day to make sure it is healing and not infected. Check for: ? Redness, swelling, or pain. ? Fluid or blood. ? Warmth. ? Pus or a bad smell. General instructions  Do not cross your legs. This may decrease blood flow to your feet.  Do not use heating pads or hot water bottles on your feet. They may burn your skin. If you have lost feeling in your feet or legs, you may not know this is happening until it is too late.  Protect your feet from hot and cold by wearing shoes, such as at the beach or on hot pavement.  Schedule a complete foot exam at least once a year (annually) or more often if you have foot problems. If you have foot problems, report any cuts, sores, or bruises to your health care provider immediately. Contact a health care provider if:  You have a medical condition that increases your risk of infection and you have any cuts, sores, or bruises  on your feet.  You have an injury that is not healing.  You have redness on your legs or feet.  You feel burning or tingling in your legs or feet.  You have pain or cramps in your legs and feet.  Your legs or feet are numb.  Your feet always feel cold.  You have pain around a toenail. Get help right away if:  You have a wound, scrape, corn, or callus on your foot and: ? You have pain, swelling, or redness that gets worse. ? You have fluid or blood coming from the wound, scrape, corn, or callus. ? Your wound, scrape, corn, or callus feels warm to the touch. ? You have pus or a bad smell coming from the wound, scrape, corn, or callus. ? You have a fever. ? You have a red line  going up your leg. Summary  Check your feet every day for cuts, sores, red spots, swelling, and blisters.  Moisturize feet and legs daily.  Wear shoes that fit properly and have enough cushioning.  If you have foot problems, report any cuts, sores, or bruises to your health care provider immediately.  Schedule a complete foot exam at least once a year (annually) or more often if you have foot problems. This information is not intended to replace advice given to you by your health care provider. Make sure you discuss any questions you have with your health care provider. Document Revised: 04/28/2019 Document Reviewed: 09/06/2016 Elsevier Patient Education  [place].  Peripheral Neuropathy Peripheral neuropathy is a type of nerve damage. It affects nerves that carry signals between the spinal cord and the arms, legs, and the rest of the body (peripheral nerves). It does not affect nerves in the spinal cord or brain. In peripheral neuropathy, one nerve or a group of nerves may be damaged. Peripheral neuropathy is a broad category that includes many specific nerve disorders, like diabetic neuropathy, hereditary neuropathy, and carpal tunnel syndrome. What are the causes? This condition may be caused by:  Diabetes. This is the most common cause of peripheral neuropathy.  Nerve injury.  Pressure or stress on a nerve that lasts a long time.  Lack (deficiency) of B vitamins. This can result from alcoholism, poor diet, or a restricted diet.  Infections.  Autoimmune diseases, such as rheumatoid arthritis and systemic lupus erythematosus.  Nerve diseases that are passed from parent to child (inherited).  Some medicines, such as cancer medicines (chemotherapy).  Poisonous (toxic) substances, such as lead and mercury.  Too little blood flowing to the legs.  Kidney disease.  Thyroid disease. In some cases, the cause of this condition is not known. What are the signs or  symptoms? Symptoms of this condition depend on which of your nerves is damaged. Common symptoms include:  Loss of feeling (numbness) in the feet, hands, or both.  Tingling in the feet, hands, or both.  Burning pain.  Very sensitive skin.  Weakness.  Not being able to move a part of the body (paralysis).  Muscle twitching.  Clumsiness or poor coordination.  Loss of balance.  Not being able to control your bladder.  Feeling dizzy.  Sexual problems. How is this diagnosed? Diagnosing and finding the cause of peripheral neuropathy can be difficult. Your health care provider will take your medical history and do a physical exam. A neurological exam will also be done. This involves checking things that are affected by your brain, spinal cord, and nerves (nervous system). For example, your  health care provider will check your reflexes, how you move, and what you can feel. You may have other tests, such as:  Blood tests.  Electromyogram (EMG) and nerve conduction tests. These tests check nerve function and how well the nerves are controlling the muscles.  Imaging tests, such as CT scans or MRI to rule out other causes of your symptoms.  Removing a small piece of nerve to be examined in a lab (nerve biopsy). This is rare.  Removing and examining a small amount of the fluid that surrounds the brain and spinal cord (lumbar puncture). This is rare. How is this treated? Treatment for this condition may involve:  Treating the underlying cause of the neuropathy, such as diabetes, kidney disease, or vitamin deficiencies.  Stopping medicines that can cause neuropathy, such as chemotherapy.  Medicine to relieve pain. Medicines may include: ? Prescription or over-the-counter pain medicine. ? Antiseizure medicine. ? Antidepressants. ? Pain-relieving patches that are applied to painful areas of skin.  Surgery to relieve pressure on a nerve or to destroy a nerve that is causing  pain.  Physical therapy to help improve movement and balance.  Devices to help you move around (assistive devices). Follow these instructions at home: Medicines  Take over-the-counter and prescription medicines only as told by your health care provider. Do not take any other medicines without first asking your health care provider.  Do not drive or use heavy machinery while taking prescription pain medicine. Lifestyle   Do not use any products that contain nicotine or tobacco, such as cigarettes and e-cigarettes. Smoking keeps blood from reaching damaged nerves. If you need help quitting, ask your health care provider.  Avoid or limit alcohol. Too much alcohol can cause a vitamin B deficiency, and vitamin B is needed for healthy nerves.  Eat a healthy diet. This includes: ? Eating foods that are high in fiber, such as fresh fruits and vegetables, whole grains, and beans. ? Limiting foods that are high in fat and processed sugars, such as fried or sweet foods. General instructions   If you have diabetes, work closely with your health care provider to keep your blood sugar under control.  If you have numbness in your feet: ? Check every day for signs of injury or infection. Watch for redness, warmth, and swelling. ? Wear padded socks and comfortable shoes. These help protect your feet.  Develop a good support system. Living with peripheral neuropathy can be stressful. Consider talking with a mental health specialist or joining a support group.  Use assistive devices and attend physical therapy as told by your health care provider. This may include using a walker or a cane.  Keep all follow-up visits as told by your health care provider. This is important. Contact a health care provider if:  You have new signs or symptoms of peripheral neuropathy.  You are struggling emotionally from dealing with peripheral neuropathy.  Your pain is not well-controlled. Get help right away  if:  You have an injury or infection that is not healing normally.  You develop new weakness in an arm or leg.  You fall frequently. Summary  Peripheral neuropathy is when the nerves in the arms, or legs are damaged, resulting in numbness, weakness, or pain.  There are many causes of peripheral neuropathy, including diabetes, pinched nerves, vitamin deficiencies, autoimmune disease, and hereditary conditions.  Diagnosing and finding the cause of peripheral neuropathy can be difficult. Your health care provider will take your medical history, do a physical  exam, and do tests, including blood tests and nerve function tests.  Treatment involves treating the underlying cause of the neuropathy and taking medicines to help control pain. Physical therapy and assistive devices may also help. This information is not intended to replace advice given to you by your health care provider. Make sure you discuss any questions you have with your health care provider. Document Revised: 07/18/2017 Document Reviewed: 10/14/2016 Elsevier Patient Education  2020 [employer].

## 2019-12-21 ENCOUNTER — Ambulatory Visit (INDEPENDENT_AMBULATORY_CARE_PROVIDER_SITE_OTHER): Payer: Medicaid Other | Admitting: Family Medicine

## 2019-12-21 ENCOUNTER — Encounter: Payer: Self-pay | Admitting: Family Medicine

## 2019-12-21 ENCOUNTER — Other Ambulatory Visit: Payer: Self-pay

## 2019-12-21 VITALS — BP 160/78 | HR 60 | Wt 179.2 lb

## 2019-12-21 DIAGNOSIS — E1100 Type 2 diabetes mellitus with hyperosmolarity without nonketotic hyperglycemic-hyperosmolar coma (NKHHC): Secondary | ICD-10-CM | POA: Diagnosis not present

## 2019-12-21 DIAGNOSIS — I10 Essential (primary) hypertension: Secondary | ICD-10-CM | POA: Diagnosis not present

## 2019-12-21 DIAGNOSIS — Z794 Long term (current) use of insulin: Secondary | ICD-10-CM | POA: Diagnosis not present

## 2019-12-21 DIAGNOSIS — L509 Urticaria, unspecified: Secondary | ICD-10-CM | POA: Diagnosis not present

## 2019-12-21 LAB — POCT GLYCOSYLATED HEMOGLOBIN (HGB A1C): HbA1c, POC (controlled diabetic range): 7.1 % — AB (ref 0.0–7.0)

## 2019-12-21 MED ORDER — LEVOCETIRIZINE DIHYDROCHLORIDE 5 MG PO TABS: 5.0000 mg | ORAL_TABLET | Freq: Every evening | ORAL | 1 refills | Status: DC

## 2019-12-21 NOTE — Patient Instructions (Signed)
It was great to see you!  Our plans for today:  - No changes to your medications today. - Come next week for a blood pressure check, bring your blood pressure cuff if you are able so we can calibrate this. - See below for diet tips with diabetes. Cut out sweets and sodas. - Let us know if you change your mind about seeing a nutritionist.   We are checking some labs today, we will call you or send you a letter if they are abnormal.   Take care and seek immediate care sooner if you develop any concerns.   Dr. [facility]  Here is an example of what a healthy plate looks like:    ? Make half your plate fruits and vegetables.     ? Focus on whole fruits.     ? Vary your veggies.  ? Make half your grains whole grains. -     ? Look for the word "whole" at the beginning of the ingredients list    ? Some whole-grain ingredients include whole oats, whole-wheat flour,        whole-grain corn, whole-grain Rabe rice, and whole rye.  ? Move to low-fat and fat-free milk or yogurt.  ? Vary your protein routine. - Meat, fish, poultry (chicken, [place]), eggs, beans (kidney, pinto), dairy.  ? Drink and eat less sodium, saturated fat, and added sugars.   Diet Recommendations for Diabetes   1. Eat at least 3 meals and 1-2 snacks per day. Never go more than 4-5 hours while awake without eating. Eat breakfast within the first hour of getting up.   2. Limit starchy foods to TWO per meal and ONE per snack. ONE portion of a starchy  food is equal to the following:   - ONE slice of bread (or its equivalent, such as half of a hamburger bun).   - 1/2 cup of a "scoopable" starchy food such as potatoes or rice.   - 15 grams of Total Carbohydrate as shown on food label.  3. Include at every meal: a protein food, a carb food, and vegetables and/or fruit.   - Obtain twice the volume of vegetables as protein or carbohydrate foods for both lunch and dinner.   - Fresh or frozen vegetables  are best.   - Keep frozen vegetables on hand for a quick vegetable serving.       Starchy (carb) foods: Bread, rice, pasta, potatoes, corn, cereal, grits, crackers, bagels, muffins, all baked goods.  (Fruits, milk, and yogurt also have carbohydrate, but most of these foods will not spike your blood sugar as most starchy foods will.)  A few fruits do cause high blood sugars; use small portions of bananas (limit to 1/2 at a time), grapes, watermelon, oranges, and most tropical fruits.    Protein foods: Meat, fish, poultry, eggs, dairy foods, and beans such as pinto and kidney beans (beans also provide carbohydrate).

## 2019-12-21 NOTE — Assessment & Plan Note (Signed)
Above goal but hasn't taken meds yet today. Reports "good numbers" at home but doesn't have log with her today. Will recommend BP check next week, bring BP cuff to calibrate. No changes made today. Will obtain BMP today.

## 2019-12-21 NOTE — Assessment & Plan Note (Addendum)
A1c at goal for age however with documented lows and highs. Highs noted more in evening with higher glycemic snacks. Extensively discussed lifestyle changes with diet today. Offered nutrition referral however patient adamantly declined, handout provided. No adjustments to insulin made today given documented lows. Patient resistant to oral medications or medication changes. Will obtain BMP today. F/u in 3 months for next A1c.

## 2019-12-21 NOTE — Assessment & Plan Note (Signed)
Will trial xyzal.

## 2019-12-21 NOTE — Progress Notes (Signed)
    SUBJECTIVE:   CHIEF COMPLAINT: Diabetes follow-up  HPI:   Diabetes, Type 2 - Last A1c 7.0 10/04/19 - Medications: Lantus 20 units daily - Compliance: good - Checking BG at home: yes, 50-249, most 150s - Diet: 3 meals per day, snacks occasionally. Likes sweets, debbie cakes in evenings.  - 24 hour recall: work up at [employer]. [employer] crossaint sandwich with ~4 oz minute maid peach juice. 1pm Lunch [employer] fish sandwich, french fries, diet root beer. 2pm banana. 6pm big mac, french fries, diet root beer. Went to bed at [place] was a typical day. - Eye exam: UTD - Foot exam: UTD - Microalbumin: n/a - Statin: yes - Denies symptoms of hypoglycemia, polyuria, polydipsia, numbness extremities, foot ulcers/trauma   Hypertension: - Medications: Valsartan-HCTZ 80-12.5 mg daily - Compliance: good, hasn't taken meds yet today. - Checking BP at home: yes,  - Denies any SOB, CP, vision changes, LE edema, medication SEs, or symptoms of hypotension - Diet: see above  Allergies - still with itchy watery eyes and skin, more during this time of year. Got relief previously with xyzal, requesting refill.  PERTINENT  PMH / PSH: Stable angina, HTN, allergic rhinitis, GERD, chronic hep C, T2DM with neuropathy, CKD 3  OBJECTIVE:   BP (!) 160/78   Pulse 60   Wt 179 lb 4 oz (81.3 kg)   SpO2 97%   BMI 33.87 kg/m   Gen: overweight female, in NAD Cardiac: regular rate, no LE edema Resp: normal WOB on RA  ASSESSMENT/PLAN:   DM (diabetes mellitus) (HCC) A1c at goal for age however with documented lows and highs. Highs noted more in evening with higher glycemic snacks. Extensively discussed lifestyle changes with diet today. Offered nutrition referral however patient adamantly declined, handout provided. No adjustments to insulin made today given documented lows. Patient resistant to oral medications or medication changes. Will obtain BMP today. F/u in 3 months for  next A1c.  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  Essential hypertension Above goal but hasn't taken meds yet today. Reports "good numbers" at home but doesn't have log with her today. Will recommend BP check next week, bring BP cuff to calibrate. No changes made today. Will obtain BMP today.    Rory Percy, [place]

## 2019-12-22 LAB — BASIC METABOLIC PANEL
BUN/Creatinine Ratio: 12 (ref 12–28)
BUN: 24 mg/dL (ref 8–27)
CO2: 23 mmol/L (ref 20–29)
Calcium: 9.1 mg/dL (ref 8.7–10.3)
Chloride: 106 mmol/L (ref 96–106)
Creatinine, Ser: 1.96 mg/dL — ABNORMAL HIGH (ref 0.57–1.00)
GFR calc Af Amer: 31 mL/min/{1.73_m2} — ABNORMAL LOW (ref >59)
GFR calc non Af Amer: 27 mL/min/{1.73_m2} — ABNORMAL LOW (ref >59)
Glucose: 124 mg/dL — ABNORMAL HIGH (ref 65–99)
Potassium: 4.8 mmol/L (ref 3.5–5.2)
Sodium: 145 mmol/L — ABNORMAL HIGH (ref 134–144)

## 2019-12-24 ENCOUNTER — Emergency Department (HOSPITAL_COMMUNITY)
Admission: EM | Admit: 2019-12-24 | Discharge: 2019-12-24 | Disposition: A | Discharge status: Home / Self Care | Payer: Medicaid Other | Attending: Emergency Medicine | Admitting: Emergency Medicine

## 2019-12-24 ENCOUNTER — Other Ambulatory Visit: Payer: Self-pay

## 2019-12-24 ENCOUNTER — Encounter (HOSPITAL_COMMUNITY): Payer: Self-pay | Admitting: Student

## 2019-12-24 ENCOUNTER — Emergency Department (HOSPITAL_COMMUNITY): Payer: Medicaid Other

## 2019-12-24 DIAGNOSIS — H1032 Unspecified acute conjunctivitis, left eye: Secondary | ICD-10-CM

## 2019-12-24 DIAGNOSIS — N183 Chronic kidney disease, stage 3 unspecified: Secondary | ICD-10-CM | POA: Diagnosis not present

## 2019-12-24 DIAGNOSIS — Z794 Long term (current) use of insulin: Secondary | ICD-10-CM | POA: Diagnosis not present

## 2019-12-24 DIAGNOSIS — Z87891 Personal history of nicotine dependence: Secondary | ICD-10-CM | POA: Insufficient documentation

## 2019-12-24 DIAGNOSIS — H5712 Ocular pain, left eye: Secondary | ICD-10-CM | POA: Diagnosis present

## 2019-12-24 DIAGNOSIS — L03213 Periorbital cellulitis: Secondary | ICD-10-CM

## 2019-12-24 DIAGNOSIS — E119 Type 2 diabetes mellitus without complications: Secondary | ICD-10-CM | POA: Diagnosis not present

## 2019-12-24 DIAGNOSIS — I1310 Hypertensive heart and chronic kidney disease without heart failure, with stage 1 through stage 4 chronic kidney disease, or unspecified chronic kidney disease: Secondary | ICD-10-CM | POA: Diagnosis not present

## 2019-12-24 LAB — CBC WITH DIFFERENTIAL/PLATELET
Abs Immature Granulocytes: 0.01 10*3/uL (ref 0.00–0.07)
Basophils Absolute: 0 10*3/uL (ref 0.0–0.1)
Basophils Relative: 0 %
Eosinophils Absolute: 0.1 10*3/uL (ref 0.0–0.5)
Eosinophils Relative: 2 %
HCT: 37.5 % (ref 36.0–46.0)
Hemoglobin: 12.5 g/dL (ref 12.0–15.0)
Immature Granulocytes: 0 %
Lymphocytes Relative: 44 %
Lymphs Abs: 2.4 10*3/uL (ref 0.7–4.0)
MCH: 30.7 pg (ref 26.0–34.0)
MCHC: 33.3 g/dL (ref 30.0–36.0)
MCV: 92.1 fL (ref 80.0–100.0)
Monocytes Absolute: 0.4 10*3/uL (ref 0.1–1.0)
Monocytes Relative: 7 %
Neutro Abs: 2.5 10*3/uL (ref 1.7–7.7)
Neutrophils Relative %: 47 %
Platelets: 228 10*3/uL (ref 150–400)
RBC: 4.07 MIL/uL (ref 3.87–5.11)
RDW: 12.5 % (ref 11.5–15.5)
WBC: 5.4 10*3/uL (ref 4.0–10.5)
nRBC: 0 % (ref 0.0–0.2)

## 2019-12-24 LAB — BASIC METABOLIC PANEL
Anion gap: 8 (ref 5–15)
BUN: 26 mg/dL — ABNORMAL HIGH (ref 8–23)
CO2: 26 mmol/L (ref 22–32)
Calcium: 9 mg/dL (ref 8.9–10.3)
Chloride: 105 mmol/L (ref 98–111)
Creatinine, Ser: 2.02 mg/dL — ABNORMAL HIGH (ref 0.44–1.00)
GFR calc Af Amer: 30 mL/min — ABNORMAL LOW (ref >60)
GFR calc non Af Amer: 26 mL/min — ABNORMAL LOW (ref >60)
Glucose, Bld: 132 mg/dL — ABNORMAL HIGH (ref 70–99)
Potassium: 4.2 mmol/L (ref 3.5–5.1)
Sodium: 139 mmol/L (ref 135–145)

## 2019-12-24 MED ORDER — CEFDINIR 300 MG PO CAPS
300.0000 mg | ORAL_CAPSULE | Freq: Two times a day (BID) | ORAL | 0 refills | Status: DC
Start: 2019-12-24 — End: 2020-01-19

## 2019-12-24 MED ORDER — IOHEXOL 300 MG/ML  SOLN
50.0000 mL | Freq: Once | INTRAMUSCULAR | Status: AC | PRN
  Administered 2019-12-24: 50 mL via INTRAVENOUS

## 2019-12-24 MED ORDER — SODIUM CHLORIDE 0.9 % IV BOLUS
1000.0000 mL | Freq: Once | INTRAVENOUS | Status: AC
  Administered 2019-12-24: 1000 mL via INTRAVENOUS

## 2019-12-24 MED ORDER — CLINDAMYCIN HCL 150 MG PO CAPS: 300.0000 mg | ORAL_CAPSULE | Freq: Three times a day (TID) | ORAL | 0 refills | Status: AC

## 2019-12-24 MED ORDER — FENTANYL CITRATE (PF) 100 MCG/2ML IJ SOLN
50.0000 ug | Freq: Once | INTRAMUSCULAR | Status: AC
  Administered 2019-12-24: 50 ug via INTRAVENOUS
  Filled 2019-12-24: qty 2

## 2019-12-24 MED ORDER — FLUORESCEIN SODIUM 1 MG OP STRP
1.0000 | ORAL_STRIP | Freq: Once | OPHTHALMIC | Status: AC
  Administered 2019-12-24: 1 via OPHTHALMIC
  Filled 2019-12-24: qty 1

## 2019-12-24 MED ORDER — TETRACAINE HCL 0.5 % OP SOLN
2.0000 [drp] | Freq: Once | OPHTHALMIC | Status: AC
  Administered 2019-12-24: 2 [drp] via OPHTHALMIC
  Filled 2019-12-24: qty 4

## 2019-12-24 MED ORDER — ERYTHROMYCIN 5 MG/GM OP OINT
TOPICAL_OINTMENT | OPHTHALMIC | 0 refills | Status: DC
Start: 2019-12-24 — End: 2020-01-19

## 2019-12-24 NOTE — ED Notes (Signed)
Visual Acuity R 20/16 L 20/50 Has reading glasses

## 2019-12-24 NOTE — ED Provider Notes (Signed)
[facility] EMERGENCY DEPARTMENT Provider Note   CSN: [id number] Arrival date & time: 12/24/19  1548     History Chief Complaint  Patient presents with  . Eyelid Pain    Kayla Gregory is a 59 y.o. 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 Kristeen Miss as well as cataract extraction with intraocular lens implants bilaterally who presents to the emergency department with complaints of L eyelid swelling x 1 week. Patient states that she is having pain and swelling to the left eyelid that seems to be progressively worsening with associated L eye drainage and redness. She states pain is worse with EOM. No alleviating factors. Yesterday AM she had a large portion of drainage to the L eye and had a film over the eye making vision a bit blurry, this has resolved and not re-occurred. She denies blurry/double vision, states her L eye vision is at baseline, wears reading glasses, denies contact lens use. She states she is followed by ophthalmologist Dr. Baird Cancer and gets injections into the bilateral eyes for "swelling behind the eye" every 6 weeks, last received these injections 12/03/19. Denies fever, chills, blurry/double vision, headache, vomiting, or neck stiffness.    HPI     Past Medical History:  Diagnosis Date  . Arthritis   . Diabetes mellitus without complication ([place]) 4332  . Environmental and seasonal allergies   . GERD (gastroesophageal reflux disease)   . Hepatitis C, chronic ([place])    treated  . Hyperlipidemia associated with type 2 diabetes mellitus ([place])   . Hypertension associated with diabetes ([place])   . Myogenic ptosis of left eyelid   . Schizophrenia [facility])     Patient Active Problem List   Diagnosis Date Noted  . Peripheral neuropathy 10/04/2019  . CKD (chronic kidney disease) stage 3, GFR 30-59 ml/min 04/06/2019  . Allergic rhinitis  12/14/2018  . Non-pitting edema 04/29/2018  . Urticaria 11/20/2017  . Chronic left shoulder pain 12/27/2016  . Trigger finger 10/07/2016  . Referred otalgia of right ear 03/31/2016  . Partial thickness burn of ankle 02/02/2016  . Stable angina ([place]) 12/22/2015  . GERD (gastroesophageal reflux disease) 12/22/2015  . Chronic hepatitis C ([place]) 12/22/2015  . DM (diabetes mellitus) ([place]) 03/31/2014  . Essential hypertension 03/31/2014    Past Surgical History:  Procedure Laterality Date  . CATARACT EXTRACTION W/ INTRAOCULAR LENS  IMPLANT, BILATERAL    . COLONOSCOPY    . JOINT REPLACEMENT Left    Hip  . PTOSIS REPAIR Left 12/25/2017   Procedure: INTERNAL PTOSIS REPAIR LEFT EYE;  Surgeon: Clista Bernhardt, MD;  Location: [facility];  Service: Ophthalmology;  Laterality: Left;  . TONSILLECTOMY       OB History   No obstetric history on file.     Family History  Problem Relation Age of Onset  . Heart attack Sister 20  . Heart attack Brother 46  . Heart attack Mother 41    Social History   Tobacco Use  . Smoking status: Former Research scientist (life sciences)  . Smokeless tobacco: Never Used  . Tobacco comment: stopped smoking cigareetes in 2001  Substance Use Topics  . Alcohol use: No    Alcohol/week: 0.0 standard drinks    Comment: quit smoking 2001  . Drug use: No    Comment: past crack and marijuana quit in 2001    Home Medications Prior to Admission medications   Medication  Sig Start Date End Date Taking? Authorizing Provider  ACCU-CHEK AVIVA PLUS test strip USE THREE TIMES DAILY 10/26/19   Rory Percy, DO  Accu-Chek Softclix Lancets lancets Use as instructed 01/13/19   Diallo, Earna Coder, MD  Accu-Chek Softclix Lancets lancets Please use to check blood sugar 3 times daily. E11.9 11/25/19   Rory Percy, DO  aspirin (ASPIRIN 81) 81 MG EC tablet Take 1 tablet (81 mg total) by mouth daily. 06/22/18   Diallo, Earna Coder, MD  atorvastatin (LIPITOR) 80 MG tablet Take 80 mg by mouth daily. 08/06/19    [provider]  Blood Glucose Monitoring Suppl (ACCU-CHEK AVIVA PLUS) w/Device KIT 1 application by Does not apply route 4 (four) times daily -  with meals and at bedtime. 11/23/18   Diallo, Earna Coder, MD  diphenhydrAMINE (BENADRYL) 25 MG tablet Take 1 tablet (25 mg total) by mouth every 6 (six) hours as needed for allergies. 06/22/18   Diallo, Earna Coder, MD  FLUoxetine (PROZAC) 20 MG capsule Take 1 capsule (20 mg total) by mouth daily. 06/22/18   Diallo, Earna Coder, MD  gabapentin (NEURONTIN) 300 MG capsule Take 1 capsule (300 mg total) by mouth 3 (three) times daily. 06/22/18   Diallo, Earna Coder, MD  hydrocortisone 1 % ointment Apply 1 application topically 2 (two) times daily as needed for itching. 06/22/18   Diallo, Earna Coder, MD  Insulin Glargine (LANTUS SOLOSTAR) 100 UNIT/ML Solostar Pen ADMINISTER 20 UNITS UNDER THE SKIN DAILY IN THE MORNING 08/23/19   Rory Percy, DO  Insulin Pen Needle (PEN NEEDLES) 31G X 8 MM MISC 1 each by Does not apply route daily. 06/28/19   Rory Percy, DO  Lancet Devices (ACCU-CHEK Atlantis) lancets Use as instructed 11/22/16   Diallo, Earna Coder, MD  levocetirizine (XYZAL) 5 MG tablet Take 1 tablet (5 mg total) by mouth every evening. 12/21/19   Rory Percy, DO  meloxicam (MOBIC) 15 MG tablet Take 1 tablet (15 mg total) by mouth daily. 08/02/19   Edrick Kins, DPM  pantoprazole (PROTONIX) 40 MG tablet Take 1 tablet (40 mg total) by mouth daily. 11/25/18   Diallo, Earna Coder, MD  Propylene Glycol (SYSTANE COMPLETE) 0.6 % SOLN Place 2-3 drops into both eyes daily as needed (for dry eyes).     [provider]  tobramycin (TOBREX) 0.3 % ophthalmic solution INT 1 GTT IN OU FOUR TIMES DAILY. BEGIN 1 DAY B TREATMENT. CONT THE DAY OF TREATMENT AND 1 FULL DAY AFTER TREATMENT 03/26/19   [provider]  traMADol (ULTRAM) 50 MG tablet Take 50 mg by mouth every 8 (eight) hours as needed. 08/26/19   [provider]  valsartan-hydrochlorothiazide  (DIOVAN-HCT) 80-12.5 MG tablet Take 1 tablet by mouth daily. 05/17/19   Rory [facility]cy, DO  vitamin B-12 (CYANOCOBALAMIN) 1000 MCG tablet Take 1,000 mcg by mouth daily.    [provider]  ziprasidone (GEODON) 20 MG capsule Take 1 capsule (20 mg total) by mouth daily. 06/22/18   Diallo, Earna Coder, MD    Allergies    Metformin and related  Review of Systems   Review of Systems  Constitutional: Negative for chills and fever.  HENT: Negative for congestion, ear pain and sore throat.   Eyes: Positive for pain, discharge and redness. Negative for visual disturbance.  Respiratory: Negative for cough and shortness of breath.   Gastrointestinal: Negative for abdominal pain, nausea and vomiting.  Musculoskeletal: Negative for neck pain and neck stiffness.  All other systems reviewed and are negative.   Physical Exam Updated Vital Signs BP Marland Kitchen)  155/82 (BP Location: Left Arm)   Pulse 65   Temp 98.4 F (36.9 C) (Oral)   Resp 14   Ht 5' 1"  (1.549 m)   Wt 78.9 kg   SpO2 92%   BMI 32.88 kg/m   Physical Exam Vitals and nursing note reviewed.  Constitutional:      General: She is not in acute distress.    Appearance: She is well-developed. She is not toxic-appearing.  HENT:     Head: Normocephalic and atraumatic.     Comments: No temporal tenderness to palpation. Eyes:     General: Lids are everted, no foreign bodies appreciated. Vision grossly intact.        Right eye: No discharge.        Left eye: Discharge present.    Extraocular Movements: Extraocular movements intact.     Conjunctiva/sclera:     Right eye: Right conjunctiva is not injected.     Left eye: Left conjunctiva is injected.     Pupils: Pupils are equal, round, and reactive to light.     Comments: Left eye has periorbital swelling and erythema with tenderness to palpation more so inferiorly.  Extraocular movements are intact but are uncomfortable in the left eye.  No proptosis.  Fluorescein stain performed and  evaluated with Sherral Hammers lamp as well as slit-lamp, no obvious abrasion or ulceration.  No dendritic staining. Visual acuity: R 20/16 L 20/50 Has reading glasses  Cardiovascular:     Rate and Rhythm: Normal rate and regular rhythm.  Pulmonary:     Effort: Pulmonary effort is normal. No respiratory distress.     Breath sounds: Normal breath sounds. No wheezing, rhonchi or rales.  Abdominal:     General: There is no distension.     Palpations: Abdomen is soft.     Tenderness: There is no abdominal tenderness.  Musculoskeletal:     Cervical back: Neck supple.  Skin:    General: Skin is warm and dry.     Findings: No rash.  Neurological:     Mental Status: She is alert.     Comments: Clear speech.   Psychiatric:        Behavior: Behavior normal.       ED Results / Procedures / Treatments   Labs (all labs ordered are listed, but only abnormal results are displayed) Labs Reviewed - No data to display  EKG None  Radiology No results found.  Procedures Procedures (including critical care time)  Medications Ordered in ED Medications - No data to display  ED Course  I have reviewed the triage vital signs and the nursing notes.  Pertinent labs & imaging results that were available during my care of the patient were reviewed by me and considered in my medical decision making (see chart for details).    MDM Rules/Calculators/A&P                      Patient presents to the ED with complaints of L eye pain/swelling. Nontoxic, vitals with the exception of elevated BP-low suspicion for HTN emergency.. On exam patient has left conjunctival injection with mild discharge, she also has notable periorbital swelling, erythema, tenderness to palpation.  No proptosis or entrapment, however patient does report pain with extraocular movements.  No fluorescein uptake to indicate abrasion, ulceration, or HSV.  Ddx: Conjunctivitis, blepharitis, periorbital cellulitis, orbital cellulitis.  No  trauma to raise concern for globe rupture.  No vision loss to raise concern for retinal  detachment.  No fixed dilated pupil or haziness to raise concern for acute angle-closure glaucoma.  Afebrile, no temporal tenderness, low suspicion for temporal arteritis.  No vision loss, doubt occlusion.  Additional history obtained:  Additional history obtained from review of nursing notes and chart review.   Lab Tests:  I Ordered, reviewed, and interpreted labs, which included:  CBC: No anemia or leukocytosis BMP: Elevated creatinine similar to prior ranges.  Imaging Studies ordered:  I ordered imaging studies which included CT orbits with contrast (reduced dose given renal function), I independently visualized and interpreted imaging which showed no orbital cellulitis.  ED Course:  Given periorbital erythema/swelling/tenderness with pain with extraocular movements CT orbits with contrast was obtained and negative for orbital cellulitis.  Will cover for periorbital cellulitis with clindamycin and cefdinir and provide erythromycin ointment given eye drainage.  Follow-up with her ophthalmologist. I discussed results, treatment plan, need for follow-up, and return precautions with the patient. Provided opportunity for questions, patient confirmed understanding and is in agreement with plan.   Findings and plan of care discussed with supervising physician Dr. Sedonia Small who is in agreement.   Portions of this note were generated with Lobbyist. Dictation errors may occur despite best attempts at proofreading.   Final Clinical Impression(s) / ED Diagnoses Final diagnoses:  Periorbital cellulitis of left eye  Acute conjunctivitis of left eye, unspecified acute conjunctivitis type    Rx / DC Orders ED Discharge Orders         Ordered    erythromycin ophthalmic ointment     12/24/19 2113    clindamycin (CLEOCIN) 150 MG capsule  3 times daily     12/24/19 2113    cefdinir (OMNICEF) 300 MG  capsule  2 times daily     12/24/19 2113           Amaryllis Dyke, PA-C 12/24/19 2145    Maudie Flakes, MD 12/30/19 [phone]

## 2019-12-24 NOTE — Discharge Instructions (Addendum)
You were seen in the ED today for eye pain, swelling, and drainage. Your labs showed that you kidney function is elevated- please have this rechecked by your primary care provider in 1-2 weeks.    We are sending you home with the following medicines to help with an infection.  We are sending you home with clindamycin and cefdinir which are oral antibiotics.  Please take them as prescribed.  We are also send you home with erythromycin ointment to place in the left eyelid 5 times per day for the next 7 days.  We have prescribed you new medication(s) today. Discuss the medications prescribed today with your pharmacist as they can have adverse effects and interactions with your other medicines including over the counter and prescribed medications. Seek medical evaluation if you start to experience new or abnormal symptoms after taking one of these medicines, seek care immediately if you start to experience difficulty breathing, feeling of your throat closing, facial swelling, or rash as these could be indications of a more serious allergic reaction  Follow-up with Kayla Gregory within 3 days for reevaluation.  Please also follow-up with your primary care provider for blood pressure recheck as her blood pressure was elevated in the ER today.  Return to the ED for new or worsening symptoms including but limited to increased pain, increased redness, increased swelling, change in your vision, fever, or any other concerns.

## 2019-12-24 NOTE — ED Triage Notes (Signed)
Pt here with c/o right eye drainage , pt des have shots done in  Her eyes for swelling ?

## 2020-01-12 ENCOUNTER — Other Ambulatory Visit: Payer: Self-pay | Admitting: *Deleted

## 2020-01-12 MED ORDER — CETIRIZINE HCL 10 MG PO TABS
10.0000 mg | ORAL_TABLET | Freq: Every day | ORAL | 11 refills | Status: DC
Start: 2020-01-12 — End: 2020-05-11

## 2020-01-19 ENCOUNTER — Other Ambulatory Visit: Payer: Self-pay

## 2020-01-19 ENCOUNTER — Encounter: Payer: Self-pay | Admitting: Family Medicine

## 2020-01-19 ENCOUNTER — Ambulatory Visit (INDEPENDENT_AMBULATORY_CARE_PROVIDER_SITE_OTHER): Payer: Medicaid Other | Admitting: Family Medicine

## 2020-01-19 DIAGNOSIS — L281 Prurigo nodularis: Secondary | ICD-10-CM | POA: Diagnosis not present

## 2020-01-19 DIAGNOSIS — L03213 Periorbital cellulitis: Secondary | ICD-10-CM | POA: Insufficient documentation

## 2020-01-19 MED ORDER — TRIAMCINOLONE ACETONIDE 0.1 % EX CREA
1.0000 | TOPICAL_CREAM | Freq: Two times a day (BID) | CUTANEOUS | 0 refills | Status: DC
Start: 2020-01-19 — End: 2020-05-11

## 2020-01-19 NOTE — Assessment & Plan Note (Signed)
Improved s/p antibiotic therapy. Encouraged to keep optho appointment.

## 2020-01-19 NOTE — Assessment & Plan Note (Signed)
Rash most consistent with prurigo nodularis based on history and exam, pathophysiology discussed with patient. Will increase potency of topical steroid, encouraged daily zyrtec use. Follows with psych for mood d/o. Patient instructed to notify clinic if itching no better in a few weeks.

## 2020-01-19 NOTE — Progress Notes (Signed)
   SUBJECTIVE:   CHIEF COMPLAINT / HPI:   Rash Endorsing ~6 month h/o dark spots on legs that are itchy, worsened ~1 month ago. Using oatmeal soap and benadryl prn at night with relief. Has tried OTC hydrocortisone cream without relief. Reports compliance with zyrtec, unsure if she is also taking xyzal. Mainly on legs, arms, and upper back. Denies numbness, tingling, fever, pain, mouth sores, joint swelling or pain. Denies new soaps, detergents. Finished antibiotics for periorbital cellulitis a few weeks ago, tolerated well.   Periorbital cellulitis Seen in ED 5/7 for L eye pain x1 week with conjunctival injection, slight discharge, swelling and erythema.  CT orbits with contrast negative for orbital cellulitis.  Given clindamycin and cefdinir and erythromycin ointment, finished a few weeks ago. Reports improvement in swelling, redness, and pain. Sees optho on 6/30 for f/u.  PERTINENT  PMH / PSH: Stable angina, HTN, GERD, chronic hep C, diabetes type 2, CKD 3  OBJECTIVE:   BP 130/70   Pulse (!) 59 Comment: provider informed  Ht 5\' 1"  (1.549 m)   Wt 179 lb (81.2 kg)   SpO2 97%   BMI 33.82 kg/m   Gen: overweight, in NAD Skin: diffuse scattered hyperpigmented nodules located on upper back, lower legs and arms without swelling, surrounding erythema, excoriations or evidence of superimposed infection.       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 Will increase potency of topical steroid, encouraged daily zyrtec use. Follows with psych for mood d/o. Patient instructed to notify clinic if itching no better in a few weeks.   Periorbital cellulitis of left eye Improved s/p antibiotic therapy. Encouraged to keep optho appointment.     Rory Percy, [place]

## 2020-01-19 NOTE — Patient Instructions (Signed)
It was great to see you!  You have prurigo nodularis which is a rash caused by scar tissue caused by scratching. The itching is from a mild allergic reaction in the skin. Take the cetirizine and cream to help with itching.   Our plans for today:  - Keep taking the cetirizine daily. - Use the cream on areas that itch a few times daily to help with itching. - Come back if your itching is not controlled with this or if your rash gets worse.  Take care and seek immediate care sooner if you develop any concerns.   Dr. [facility]

## 2020-02-04 ENCOUNTER — Other Ambulatory Visit: Payer: Self-pay

## 2020-02-04 MED ORDER — PANTOPRAZOLE SODIUM 40 MG PO TBEC: 40.0000 mg | DELAYED_RELEASE_TABLET | Freq: Every day | ORAL | 0 refills | Status: DC

## 2020-02-08 ENCOUNTER — Telehealth: Payer: Self-pay | Admitting: Pharmacist

## 2020-02-08 NOTE — Telephone Encounter (Signed)
Patient call reporting failure of TWO insulin pens of Lantus.  She believes there is air in the pens.  She reports the plungers are in the middle of the tube and she has tried multiple needle tips.   I agreed to supply her with supply of Lantus pens until 7/18 when she states her supply will run out.   NOTE: Patient reports dose of 36 units daily (however chart reports dose of 20 units daily).   She will require 3 pens to cover until 7/18 (I provided adequate supply of patient's current stated dose.   Medication Samples have been provided for the patient to pick-up on 6/23  Drug name: Lantus       Strength: 100units/ml        Qty: 3 pens   LOT: [id number]  Exp.Date: 05/18/2021  Dosing instructions:  (As previous - 36units daily).   The patient has been instructed regarding the correct time, dose, and frequency of taking this medication, including desired effects and most common side effects.   Kayla Gregory 5:20 PM 02/08/2020   She may need reevaluation for dose adjustment if she has low readings, given A1C of 7.1 recently.

## 2020-02-15 ENCOUNTER — Other Ambulatory Visit: Payer: Self-pay | Admitting: Family Medicine

## 2020-02-18 ENCOUNTER — Other Ambulatory Visit: Payer: Self-pay | Admitting: Family Medicine

## 2020-02-18 DIAGNOSIS — E1100 Type 2 diabetes mellitus with hyperosmolarity without nonketotic hyperglycemic-hyperosmolar coma (NKHHC): Secondary | ICD-10-CM

## 2020-02-18 DIAGNOSIS — Z794 Long term (current) use of insulin: Secondary | ICD-10-CM

## 2020-03-13 ENCOUNTER — Ambulatory Visit: Payer: Medicaid Other | Admitting: Podiatry

## 2020-03-21 ENCOUNTER — Other Ambulatory Visit: Payer: Self-pay | Admitting: *Deleted

## 2020-03-21 DIAGNOSIS — E1100 Type 2 diabetes mellitus with hyperosmolarity without nonketotic hyperglycemic-hyperosmolar coma (NKHHC): Secondary | ICD-10-CM

## 2020-03-21 DIAGNOSIS — Z794 Long term (current) use of insulin: Secondary | ICD-10-CM

## 2020-03-21 MED ORDER — ACCU-CHEK AVIVA PLUS VI STRP: ORAL_STRIP | 11 refills | Status: DC

## 2020-03-22 ENCOUNTER — Other Ambulatory Visit: Payer: Self-pay | Admitting: Family Medicine

## 2020-03-22 ENCOUNTER — Other Ambulatory Visit: Payer: Self-pay | Admitting: Internal Medicine

## 2020-03-22 DIAGNOSIS — Z1231 Encounter for screening mammogram for malignant neoplasm of breast: Secondary | ICD-10-CM

## 2020-03-30 ENCOUNTER — Other Ambulatory Visit: Payer: Self-pay | Admitting: Family Medicine

## 2020-03-30 DIAGNOSIS — E1100 Type 2 diabetes mellitus with hyperosmolarity without nonketotic hyperglycemic-hyperosmolar coma (NKHHC): Secondary | ICD-10-CM

## 2020-03-30 DIAGNOSIS — Z794 Long term (current) use of insulin: Secondary | ICD-10-CM

## 2020-03-31 ENCOUNTER — Other Ambulatory Visit: Payer: Self-pay | Admitting: Family Medicine

## 2020-03-31 DIAGNOSIS — E1100 Type 2 diabetes mellitus with hyperosmolarity without nonketotic hyperglycemic-hyperosmolar coma (NKHHC): Secondary | ICD-10-CM

## 2020-03-31 DIAGNOSIS — Z794 Long term (current) use of insulin: Secondary | ICD-10-CM

## 2020-03-31 MED ORDER — LANTUS SOLOSTAR 100 UNIT/ML ~~LOC~~ SOPN: 20.0000 [IU] | PEN_INJECTOR | Freq: Every day | SUBCUTANEOUS | 0 refills | Status: DC

## 2020-04-12 NOTE — Progress Notes (Signed)
SUBJECTIVE:   CHIEF COMPLAINT / HPI:   DM: pt takes 40 Units of lantus daily.  She states her previous doctor tried to decrease her to 20 U but she has still been taking the 40 U and it is causing her to run out of insulin before she can get a refill.  She states she knows her body and knows how much insulin she can tolerate.  She does not take her insulin in the AM if her blood sugar is too low.  She will also sometimes check blood sugar during the day and occasionally takes the lantus later in the afternoon if it is too high.  She also needs a new device for her lancets as her previous one broke.    CKD: the patient states she went to the nephrologist once upon our referral but did not like her experience there and has since not gone back.  She is not interested in seeing the nephrologist at this time but understands if her kidney function worsens she will need to be sent to the nephrologist.    HTN: pt is compliant with diovan. No cough.    Vaginal bleeding: the patient stated at the end of our encounter that she has occasional spotting of blood on toilet paper after voiding.  Menses ceased several years ago.    Screening for colon cancer: pt states it has been over 5 years since her last colonoscopy (which was in [place]), and she has a family member who died from colon cancer.  She states she was told to get a colonoscopy every 5 years after her last one.    PERTINENT  PMH / PSH: DM, HTN, CKD  OBJECTIVE:   BP 122/76   Pulse 70   Ht 5\' 1"  (1.549 m)   Wt 177 lb 9.6 oz (80.6 kg)   SpO2 98%   BMI 33.56 kg/m   Gen: alert, oriented.  No acute distress.   HEENT: PERRLA, Moist oral mucosa.   CV: RRR. No murmurs.   Pulm: LCTAB. No wheezes.  GI: soft, nontender, normal bowel sounds.  Ext: no LE edema.    ASSESSMENT/PLAN:   DM (diabetes mellitus) ([place]) Previous physician who saw her in our clinic attempted to decrease her lantus dose as her A1c's have been below 7 and they wanted  to avoid hypoglycemic events, but pt has continued to take 40 U of insulin regardless. Today her A1c remained stable at 6.9.  She does not endorse any hypoglycemic episodes.  Pt is very adamant about not changing dose, so I agreed to change her Rx back to 40U daily on the condition she only administers her insulin at the same time each day (AM) and does not check her glucose throughout the day unless she feels like she is having a hypoglycemic episode. Pt not appropriate for metformin due to her CKD. Filled Rx for new lancet device.    CKD (chronic kidney disease) stage 3, GFR 30-59 ml/min ([facility]) Pt not willing to go to nephrology at this time but I advised the pt there will likely come a time when she will need to start seeing a nephrologist for her CKD if it keeps progressing.  This is my first time with the patient and I did not want to push the issue too much with her but she agreed to discuss it further on our next encounter. Will get bmp to monitor for progression.  Currently on ARB.   Essential hypertension Stable, well  controlled.  Continue current medication.   Post-menopausal bleeding Pt mentioned having some spotting after I had handed her the after visit summary, so I was not able to perform a vaginal exam.  Informed pt I would schedule her for our colposcopy clinic for further evaluation and possible biopsy.    Screening for colon cancer Pt states it has been over 5 years since her last colonoscopy which was performed in [place]. Per her health records, she is due for next colonoscopy in 4 months.  Notes from 2017 imply she had a colonoscopy in 2016 which would make her eligible now. Nothing in media tab regarding scanned results of previous colonoscopies so I cannot verify the date.  Will send in GI referral for colonoscopy.       Benay Pike, MD [place]

## 2020-04-13 ENCOUNTER — Other Ambulatory Visit: Payer: Self-pay

## 2020-04-13 ENCOUNTER — Encounter: Payer: Self-pay | Admitting: Family Medicine

## 2020-04-13 ENCOUNTER — Ambulatory Visit (INDEPENDENT_AMBULATORY_CARE_PROVIDER_SITE_OTHER): Payer: Medicaid Other | Admitting: Family Medicine

## 2020-04-13 VITALS — BP 122/76 | HR 70 | Ht 61.0 in | Wt 177.6 lb

## 2020-04-13 DIAGNOSIS — Z794 Long term (current) use of insulin: Secondary | ICD-10-CM | POA: Diagnosis not present

## 2020-04-13 DIAGNOSIS — I10 Essential (primary) hypertension: Secondary | ICD-10-CM

## 2020-04-13 DIAGNOSIS — N950 Postmenopausal bleeding: Secondary | ICD-10-CM | POA: Diagnosis not present

## 2020-04-13 DIAGNOSIS — E1100 Type 2 diabetes mellitus with hyperosmolarity without nonketotic hyperglycemic-hyperosmolar coma (NKHHC): Secondary | ICD-10-CM | POA: Diagnosis not present

## 2020-04-13 DIAGNOSIS — N1832 Chronic kidney disease, stage 3b: Secondary | ICD-10-CM | POA: Diagnosis not present

## 2020-04-13 DIAGNOSIS — Z1211 Encounter for screening for malignant neoplasm of colon: Secondary | ICD-10-CM

## 2020-04-13 LAB — POCT GLYCOSYLATED HEMOGLOBIN (HGB A1C): HbA1c, POC (controlled diabetic range): 6.9 % (ref 0.0–7.0)

## 2020-04-13 MED ORDER — LANTUS SOLOSTAR 100 UNIT/ML ~~LOC~~ SOPN: 40.0000 [IU] | PEN_INJECTOR | Freq: Every day | SUBCUTANEOUS | 5 refills | Status: DC

## 2020-04-13 MED ORDER — LANCET DEVICE MISC: 0 refills | Status: AC

## 2020-04-13 NOTE — Patient Instructions (Addendum)
It was nice to meet you today,  We covered a lot of topics today, so I will send the note below: -We will increase your Lantus to 40 units as that has been what you have been taking.  This way we do not run out.  Please only check your blood sugar once a day in the morning. -I will send in a prescription for the lancet device.  If insurance does not cover it you can always buy them over-the-counter for less than $20. -I will send in referral to the GI doctor for colonoscopy -I will schedule you with our colposcopy clinic -We will get a BMP to check your kidney function.  Please follow-up with me in 3 months.  Have a great day,  Clemetine Marker, MD

## 2020-04-14 ENCOUNTER — Ambulatory Visit
Admission: RE | Admit: 2020-04-14 | Discharge: 2020-04-14 | Disposition: A | Discharge status: Home / Self Care | Payer: Medicaid Other | Source: Ambulatory Visit | Attending: Family Medicine | Admitting: Family Medicine

## 2020-04-14 ENCOUNTER — Encounter: Payer: Self-pay | Admitting: Family Medicine

## 2020-04-14 DIAGNOSIS — Z1231 Encounter for screening mammogram for malignant neoplasm of breast: Secondary | ICD-10-CM

## 2020-04-14 LAB — BASIC METABOLIC PANEL
BUN/Creatinine Ratio: 13 (ref 12–28)
BUN: 27 mg/dL (ref 8–27)
CO2: 21 mmol/L (ref 20–29)
Calcium: 9.3 mg/dL (ref 8.7–10.3)
Chloride: 106 mmol/L (ref 96–106)
Creatinine, Ser: 2.16 mg/dL — ABNORMAL HIGH (ref 0.57–1.00)
GFR calc Af Amer: 28 mL/min/{1.73_m2} — ABNORMAL LOW (ref >59)
GFR calc non Af Amer: 24 mL/min/{1.73_m2} — ABNORMAL LOW (ref >59)
Glucose: 192 mg/dL — ABNORMAL HIGH (ref 65–99)
Potassium: 4.2 mmol/L (ref 3.5–5.2)
Sodium: 143 mmol/L (ref 134–144)

## 2020-04-18 ENCOUNTER — Other Ambulatory Visit: Payer: Self-pay | Admitting: *Deleted

## 2020-04-18 ENCOUNTER — Telehealth: Payer: Self-pay | Admitting: *Deleted

## 2020-04-18 DIAGNOSIS — E1100 Type 2 diabetes mellitus with hyperosmolarity without nonketotic hyperglycemic-hyperosmolar coma (NKHHC): Secondary | ICD-10-CM

## 2020-04-18 DIAGNOSIS — Z794 Long term (current) use of insulin: Secondary | ICD-10-CM

## 2020-04-18 NOTE — Telephone Encounter (Signed)
Pt calls because she called [facility] and they have not received the referral yet.    She is requesting this to be put in ASAP so that she can get this process started. Christen Bame, CMA

## 2020-04-19 DIAGNOSIS — Z1211 Encounter for screening for malignant neoplasm of colon: Secondary | ICD-10-CM | POA: Insufficient documentation

## 2020-04-19 DIAGNOSIS — N950 Postmenopausal bleeding: Secondary | ICD-10-CM | POA: Insufficient documentation

## 2020-04-19 MED ORDER — GABAPENTIN 300 MG PO CAPS: 300.0000 mg | ORAL_CAPSULE | Freq: Three times a day (TID) | ORAL | 2 refills | Status: DC

## 2020-04-19 NOTE — Assessment & Plan Note (Signed)
Pt not willing to go to nephrology at this time but I advised the pt there will likely come a time when she will need to start seeing a nephrologist for her CKD if it keeps progressing.  This is my first time with the patient and I did not want to push the issue too much with her but she agreed to discuss it further on our next encounter. Will get bmp to monitor for progression.  Currently on ARB.

## 2020-04-19 NOTE — Assessment & Plan Note (Signed)
Stable, well controlled. Continue current medication 

## 2020-04-19 NOTE — Assessment & Plan Note (Signed)
Pt mentioned having some spotting after I had handed her the after visit summary, so I was not able to perform a vaginal exam.  Informed pt I would schedule her for our colposcopy clinic for further evaluation and possible biopsy.

## 2020-04-19 NOTE — Assessment & Plan Note (Addendum)
Pt states it has been over 5 years since her last colonoscopy which was performed in [place]. Per her health records, she is due for next colonoscopy in 4 months.  Notes from 2017 imply she had a colonoscopy in 2016 which would make her eligible now. Nothing in media tab regarding scanned results of previous colonoscopies so I cannot verify the date.  Will send in GI referral for colonoscopy.

## 2020-04-19 NOTE — Assessment & Plan Note (Signed)
Previous physician who saw her in our clinic attempted to decrease her lantus dose as her A1c's have been below 7 and they wanted to avoid hypoglycemic events, but pt has continued to take 40 U of insulin regardless. Today her A1c remained stable at 6.9.  She does not endorse any hypoglycemic episodes.  Pt is very adamant about not changing dose, so I agreed to change her Rx back to 40U daily on the condition she only administers her insulin at the same time each day (AM) and does not check her glucose throughout the day unless she feels like she is having a hypoglycemic episode. Pt not appropriate for metformin due to her CKD. Filled Rx for new lancet device.

## 2020-04-20 ENCOUNTER — Other Ambulatory Visit (HOSPITAL_COMMUNITY)
Admission: RE | Admit: 2020-04-20 | Discharge: 2020-04-20 | Disposition: A | Discharge status: Home / Self Care | Payer: Medicaid Other | Source: Ambulatory Visit | Attending: Family Medicine | Admitting: Family Medicine

## 2020-04-20 ENCOUNTER — Ambulatory Visit (INDEPENDENT_AMBULATORY_CARE_PROVIDER_SITE_OTHER): Payer: Medicaid Other | Admitting: Family Medicine

## 2020-04-20 ENCOUNTER — Other Ambulatory Visit: Payer: Self-pay

## 2020-04-20 VITALS — BP 156/76 | HR 62 | Wt 178.0 lb

## 2020-04-20 DIAGNOSIS — N950 Postmenopausal bleeding: Secondary | ICD-10-CM | POA: Diagnosis not present

## 2020-04-20 DIAGNOSIS — R3121 Asymptomatic microscopic hematuria: Secondary | ICD-10-CM | POA: Diagnosis not present

## 2020-04-20 LAB — POCT WET PREP (WET MOUNT)
Clue Cells Wet Prep Whiff POC: NEGATIVE
Trichomonas Wet Prep HPF POC: ABSENT

## 2020-04-20 LAB — POCT URINALYSIS DIP (CLINITEK)
Bilirubin, UA: NEGATIVE
Glucose, UA: 100 mg/dL — AB
Ketones, POC UA: NEGATIVE mg/dL
Leukocytes, UA: NEGATIVE
Nitrite, UA: NEGATIVE
POC PROTEIN,UA: >=300 — AB
Spec Grav, UA: 1.025 (ref 1.010–1.025)
Urobilinogen, UA: 1 E.U./dL
pH, UA: 6.5 (ref 5.0–8.0)

## 2020-04-20 NOTE — Patient Instructions (Addendum)
Your exam was normal.  I will contact you with the results of your tests when they have resulted.  I have sent a referral for a ultrasound and will let you know the results after you have this completed.   Postmenopausal Bleeding  Postmenopausal bleeding is any bleeding that occurs after menopause. Menopause is when a woman's period stops. Any type of bleeding after menopause should be checked by your doctor. Treatment will depend on the cause. Follow these instructions at home:  Pay attention to any changes in your symptoms.  Avoid using tampons and douches as told by your doctor.  Change your pads regularly.  Get regular pelvic exams and Pap tests.  Take iron pills as told by your doctor.  Take over-the-counter and prescription medicines only as told by your doctor.  Keep all follow-up visits as told by your doctor. This is important. Contact a doctor if:  Your bleeding lasts for more than 1 week.  You have pain in your belly (abdomen).  You have bleeding during or after sex.  You have bleeding that happens more often than every 3 weeks. Get help right away if:  You have fever, chills, headache, dizziness, muscle aches, or bleeding.  You have very bad pain with bleeding.  You have clumps of blood (blood clots) coming from your vagina.  You have a lot of bleeding, and: ? You use more than 1 pad an hour. ? This kind of bleeding has never happened before.  You feel like you are going to pass out (faint). Summary  Any type of bleeding after menopause should be checked by your doctor.  Pay attention to any changes in your symptoms.  Keep all follow-up visits as told by your doctor. This information is not intended to replace advice given to you by your health care provider. Make sure you discuss any questions you have with your health care provider. Document Revised: 10/22/2018 Document Reviewed: 09/10/2016 Elsevier Patient Education  2020 [employer].

## 2020-04-20 NOTE — Progress Notes (Signed)
    CHIEF COMPLAINT / HPI: Physical last 2 months or so the patient is noted some blood on the toilet tissue when she wipes after urinating.  She cannot tell if it is coming from urine or from vaginal area.  Does not appear to be coming from her rectum.  Has no blood on her panties in between, only when she wipes.  Has had no pelvic pain, no vaginal discharge.  Not sexually active for greater than 5 years.  Postmenopausal at age 59.  No burning on urination, no unusual urinary frequency.  No history of abnormal Paps.   PERTINENT  PMH / PSH: I have reviewed the patient's medications, allergies, past medical and surgical history, smoking status and updated in the EMR as appropriate.   OBJECTIVE:  BP (!) 156/76   Pulse 62   Wt 178 lb (80.7 kg)   SpO2 96%   BMI 33.63 kg/m  GENERAL: Well-developed female no acute distress GU: Externally slight atrophy but no specific abnormalities.  Bimanual is without any adnexal masses or tenderness.  Cervix appears normal.  Mildly friable noted we were collecting samples.  ASSESSMENT / PLAN: GU bleeding.  It is unknown if this is vaginal or from the urethral area.  Urinalysis today was too scant to get a microscopic bit the dipstick showed large hemoglobin.  I will have her come back and we will repeat urinalysis looking for red blood cells specifically.  We will also set her up for a pelvic ultrasound.  Did STI testing today.  I will have her scheduled to see me in the next 2 weeks.  I am not at all sure that this is coming from the vaginal area. Not really comfortable diagnosing this as definitive postmenopausal bleeding but unclear what else to call it. I do think urethral / urinary source is just as likely.  No problem-specific Assessment & Plan notes found for this encounter.   Dorcas Mcmurray MD

## 2020-04-20 NOTE — Telephone Encounter (Signed)
Referral placed.

## 2020-04-20 NOTE — Progress Notes (Signed)
Need repeat urine with microscopuic exam at her f/u appt Patient notified by nurse at time of her scheduling of pelvic US

## 2020-04-21 LAB — CERVICOVAGINAL ANCILLARY ONLY
Chlamydia: NEGATIVE
Comment: NEGATIVE
Comment: NORMAL
Neisseria Gonorrhea: NEGATIVE

## 2020-04-25 NOTE — Telephone Encounter (Signed)
To referral coordinator.  Kayla Gregory, CMA  

## 2020-04-25 NOTE — Telephone Encounter (Signed)
Referral faxed to [facility].  Khiem Gargis,CMA  

## 2020-04-27 ENCOUNTER — Ambulatory Visit: Admission: RE | Admit: 2020-04-27 | Payer: Medicaid Other | Source: Ambulatory Visit

## 2020-05-08 ENCOUNTER — Other Ambulatory Visit: Payer: Self-pay | Admitting: Family Medicine

## 2020-05-08 ENCOUNTER — Ambulatory Visit
Admission: RE | Admit: 2020-05-08 | Discharge: 2020-05-08 | Disposition: A | Discharge status: Home / Self Care | Payer: Medicaid Other | Source: Ambulatory Visit | Attending: Family Medicine | Admitting: Family Medicine

## 2020-05-08 ENCOUNTER — Other Ambulatory Visit: Payer: Self-pay

## 2020-05-08 DIAGNOSIS — N950 Postmenopausal bleeding: Secondary | ICD-10-CM | POA: Diagnosis not present

## 2020-05-10 ENCOUNTER — Ambulatory Visit: Payer: Medicaid Other | Admitting: Family Medicine

## 2020-05-10 NOTE — Progress Notes (Signed)
    SUBJECTIVE:   CHIEF COMPLAINT / HPI:   Vaginal bleeding: Patient would like to know the results of her ultrasound.  Discussed that they were normal.  Patient has not had any instances of bright red blood when using the bathroom since our last encounter.  No abdominal or vaginal pain.  Allergies: Patient takes 3 tablets of Benadryl at night for itching and congestion.  She does not want to take Zyrtec because she heard it causes cancer.  She is willing to take Claritin.  Leg lesions: Patient states the small spots on her shins are pruritic and have not improved since last visit.   PERTINENT  PMH / PSH: DM, HTN, CKD  OBJECTIVE:   BP (!) 165/80   Pulse 63   Ht 5\' 1"  (1.549 m)   Wt 178 lb 3.2 oz (80.8 kg)   SpO2 96%   BMI 33.67 kg/m   General: Alert, oriented.  No acute distress. CV: Regular rate and rhythm, no murmurs. Skin: Small circular, approximately 1 cm in diameter hyperpigmented macular lesions on the lower extremities bilaterally.  ASSESSMENT/PLAN:   Post-menopausal bleeding Patient has not noticed any bleeding since our last visit.  Her vaginal ultrasound was normal with normal endometrial thickness.  At this time do not need endometrial biopsy.  Patient did have microscopic hematuria.  Will need to repeat this at her next visit.  Allergic rhinitis Patient has been taking Benadryl 75 mg every night.  Advised patient to discontinue this medication and switch to Claritin instead..  She has tried multiple antihistamines in the past.  We will follow up at next visit.  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  Appears most likely discoid eczema, but differential includes stasis dermatitis, lichen simplex.  Will treat with triamcinolone on the affected lesions.  Follow-up at next visit.     Benay Pike, MD [place]

## 2020-05-11 ENCOUNTER — Ambulatory Visit (INDEPENDENT_AMBULATORY_CARE_PROVIDER_SITE_OTHER): Payer: Medicaid Other | Admitting: Family Medicine

## 2020-05-11 ENCOUNTER — Other Ambulatory Visit: Payer: Self-pay

## 2020-05-11 VITALS — BP 165/80 | HR 63 | Ht 61.0 in | Wt 178.2 lb

## 2020-05-11 DIAGNOSIS — J301 Allergic rhinitis due to pollen: Secondary | ICD-10-CM | POA: Diagnosis not present

## 2020-05-11 DIAGNOSIS — Z23 Encounter for immunization: Secondary | ICD-10-CM | POA: Diagnosis not present

## 2020-05-11 DIAGNOSIS — N950 Postmenopausal bleeding: Secondary | ICD-10-CM | POA: Diagnosis not present

## 2020-05-11 DIAGNOSIS — L300 Nummular dermatitis: Secondary | ICD-10-CM

## 2020-05-11 MED ORDER — LORATADINE 10 MG PO TBDP: 10.0000 mg | ORAL_TABLET | Freq: Every day | ORAL | 12 refills | Status: DC

## 2020-05-11 MED ORDER — TRIAMCINOLONE ACETONIDE 0.1 % EX CREA: 1.0000 "application " | TOPICAL_CREAM | Freq: Two times a day (BID) | CUTANEOUS | 0 refills | Status: DC

## 2020-05-11 NOTE — Patient Instructions (Addendum)
It was nice to see you today,  Because your ultrasound was normal we do not need to get a endometrial biopsy at this time.  I am still waiting on the results of the urine dipstick.  If the urine dipstick is positive for hemoglobin I will send a referral to the urologist for further work-up.  I have prescribed you Claritin.  Please take this once a day.  Please do not take the Benadryl while you are taking this medication.  If you do take Benadryl please do not take more than 25 mg at a time.  I have prescribed you a steroid cream.  You can put it on the dark spots on your legs twice a day for 2 weeks.  If it is not improving after that time then please stop taking it.  Have a great day,  Clemetine Marker, MD

## 2020-05-13 DIAGNOSIS — L300 Nummular dermatitis: Secondary | ICD-10-CM | POA: Insufficient documentation

## 2020-05-13 NOTE — Assessment & Plan Note (Signed)
Patient has been taking Benadryl 75 mg every night.  Advised patient to discontinue this medication and switch to Claritin instead..  She has tried multiple antihistamines in the past.  We will follow up at next visit.

## 2020-05-13 NOTE — Assessment & Plan Note (Signed)
>>  ASSESSMENT AND PLAN FOR ALLERGIC RHINITIS WRITTEN ON 05/13/2020 11:41 AM BY Sandre Kitty, MD  Patient has been taking Benadryl 75 mg every night.  Advised patient to discontinue this medication and switch to Claritin instead..  She has tried multiple antihistamines in the past.  We will follow up at next visit.

## 2020-05-13 NOTE — Assessment & Plan Note (Signed)
Patient has small discrete hyperpigmented pruritic lesions on the lower extremities bilaterally.  Appears most likely discoid eczema, but differential includes stasis dermatitis, lichen simplex.  Will treat with triamcinolone on the affected lesions.  Follow-up at next visit.

## 2020-05-13 NOTE — Assessment & Plan Note (Signed)
Patient has not noticed any bleeding since our last visit.  Her vaginal ultrasound was normal with normal endometrial thickness.  At this time do not need endometrial biopsy.  Patient did have microscopic hematuria.  Will need to repeat this at her next visit.

## 2020-05-22 ENCOUNTER — Encounter: Payer: Self-pay | Admitting: Family Medicine

## 2020-05-25 ENCOUNTER — Other Ambulatory Visit: Payer: Self-pay | Admitting: Family Medicine

## 2020-05-25 ENCOUNTER — Telehealth: Payer: Self-pay | Admitting: Family Medicine

## 2020-05-25 DIAGNOSIS — R3129 Other microscopic hematuria: Secondary | ICD-10-CM

## 2020-05-25 NOTE — Progress Notes (Signed)
Called pt to let her know I have placed a referral to urology for microscopic hematuria after discussing it with Dr. Nori Riis.  No answer.  Voicemail with office callback number was left.

## 2020-05-25 NOTE — Telephone Encounter (Signed)
Pt called requested a call back from doctor regarding most recent referral.

## 2020-05-26 ENCOUNTER — Other Ambulatory Visit: Payer: Self-pay | Admitting: Family Medicine

## 2020-05-26 NOTE — Telephone Encounter (Signed)
Patient calls nurse line regarding receiving rx refill.   To PCP  Talbot Grumbling, RN

## 2020-05-30 NOTE — Telephone Encounter (Signed)
Reached out to patient.  No answer.  Left voicemail.  Will attempt to contact her again this afternoon.

## 2020-06-13 ENCOUNTER — Other Ambulatory Visit: Payer: Self-pay

## 2020-06-13 ENCOUNTER — Ambulatory Visit (INDEPENDENT_AMBULATORY_CARE_PROVIDER_SITE_OTHER): Payer: Medicaid Other | Admitting: Podiatry

## 2020-06-13 ENCOUNTER — Encounter: Payer: Self-pay | Admitting: Podiatry

## 2020-06-13 DIAGNOSIS — M79676 Pain in unspecified toe(s): Secondary | ICD-10-CM | POA: Diagnosis not present

## 2020-06-13 DIAGNOSIS — B351 Tinea unguium: Secondary | ICD-10-CM | POA: Diagnosis not present

## 2020-06-13 DIAGNOSIS — E0843 Diabetes mellitus due to underlying condition with diabetic autonomic (poly)neuropathy: Secondary | ICD-10-CM

## 2020-06-17 NOTE — Progress Notes (Signed)
Subjective: Kayla Gregory presents today for follow up of at risk foot care. Pt has h/o NIDDM with chronic kidney disease and painful mycotic nails b/l that are difficult to trim. Pain interferes with ambulation. Aggravating factors include wearing enclosed shoe gear. Pain is relieved with periodic professional debridement.   Patient states blood glucose was 118 mg/dl this morning.  PCP is Dr. Addison Naegeli. Last visit was 04/13/2020.  Allergies  Allergen Reactions  . Metformin And Related Diarrhea and Other (See Comments)    GI symptoms including abdominal pain and diarrhea - Pt not interested in taking again     Objective: There were no vitals filed for this visit.  Pt is a pleasant 59 y.o. year old AA female WD, WN in NAD. AAO x 3.   Vascular Examination:  Capillary refill time to digits immediate b/l. Palpable DP pulses b/l. Palpable PT pulses b/l. Pedal hair sparse b/l. Skin temperature gradient within normal limits b/l. No edema noted b/l.  Dermatological Examination: Pedal skin with normal turgor, texture and tone bilaterally. No open wounds bilaterally. No interdigital macerations bilaterally. Toenails 1-5 b/l elongated, dystrophic, thickened, crumbly with subungual debris and tenderness to dorsal palpation. Incurvated nailplate b/l great toes with tenderness to palpation. No erythema, no edema, no drainage noted.  She does have early callus formation left hallux IPJ. No erythema, no edema, no drainage, no flocculence.  Musculoskeletal: Normal muscle strength 5/5 to all lower extremity muscle groups bilaterally. No gross bony deformities bilaterally. No pain crepitus or joint limitation noted with ROM b/l. Patient ambulates independent of any assistive aids.  Neurological: Protective sensation intact 5/5 intact bilaterally with 10g monofilament b/l. Vibratory sensation intact b/l. Proprioception intact bilaterally. Babinski reflex negative b/l. Clonus negative  b/l.  Assessment: 1. Pain due to onychomycosis of toenail   2. Diabetes mellitus due to underlying condition with diabetic autonomic neuropathy, unspecified whether long term insulin use ([place])     Plan: -Continue diabetic foot care principles. Literature dispensed on today.  -Toenails 1-5 b/l were debrided in length and girth with sterile nail nippers and dremel without iatrogenic bleeding. Offending nail borders debrided and curretaged b/l great toes. Borders cleansed with alcohol. Antibiotic ointment applied. No further treatment required by patient. -Continue pumice stone to left hallux..  -Patient to continue soft, supportive shoe gear daily. -Patient to report any pedal injuries to medical professional immediately. -Patient/POA to call should there be question/concern in the interim.  Return in about 3 months (around 09/13/2020) for diabetic foot care.

## 2020-06-19 HISTORY — PX: COLONOSCOPY: SHX174

## 2020-06-20 ENCOUNTER — Other Ambulatory Visit: Payer: Self-pay | Admitting: Family Medicine

## 2020-06-26 ENCOUNTER — Ambulatory Visit: Payer: Medicaid Other

## 2020-07-31 ENCOUNTER — Other Ambulatory Visit: Payer: Self-pay | Admitting: Family Medicine

## 2020-08-18 ENCOUNTER — Other Ambulatory Visit: Payer: Self-pay | Admitting: Family Medicine

## 2020-08-21 NOTE — Telephone Encounter (Signed)
We don;t handle refills for this office.

## 2020-08-23 LAB — HM DIABETES EYE EXAM

## 2020-08-26 ENCOUNTER — Other Ambulatory Visit: Payer: Self-pay | Admitting: Family Medicine

## 2020-08-31 NOTE — Progress Notes (Signed)
    SUBJECTIVE:   CHIEF COMPLAINT / HPI:   DM:  Patient is still taking her lantus 40 U qhs.  She has improved her diet since our last visit.  She has stopped eating cakes and sweets.  She is eating more vegetables including cabbage and spinach.  She admits to eating pork rinds. She sees Dr. Gillian Scarce for ophtlamologic exams and had a reent visit with them.    HTN: patient did not take her blood pressure pills this morning.  She states she takes her BP with a home meter and says it is usually in the '976B' systolic.      Patient had recent unprotected sex with a new female partner and would like to be tested for STIs.  She denies itching, burning, or discharge.     PERTINENT  PMH / PSH:   OBJECTIVE:   BP (!) 178/94   Pulse 71   Ht 5\' 1"  (1.549 m)   Wt 184 lb 3.2 oz (83.6 kg)   SpO2 96%   BMI 34.80 kg/m   General: Alert and oriented.  No acute distress CV: Regular rate and rhythm Pulmonary: Lungs clear to auscultation bilaterally EXT: Normal sensation in feet.  No ulcers.  DP pulses 2+ bilaterally  ASSESSMENT/PLAN:   DM (diabetes mellitus) ([place]) Well-controlled on current regimen.  Latest A1c 6.1.  Patient happy with this.  Normal foot exam.  Continue current medications.  Encounter for assessment of sexually transmitted disease exposure Patient had recent sexual encounter with a female partner and did not use condoms.  Would like to get tested for sexually transmitted infections.  Currently asymptomatic.  We will get blood tests and swab and follow-up with results  Essential hypertension Blood pressure elevated today.  Patient states she did not take her medicine yet this morning.  Advised patient to take her medicine and recheck at home.  She has a home blood pressure monitor and checks often.  Usually in the normotensive range under 130.  Advised her to call us if blood pressure does not improve     Benay Pike, MD [place]

## 2020-09-01 ENCOUNTER — Ambulatory Visit (INDEPENDENT_AMBULATORY_CARE_PROVIDER_SITE_OTHER): Payer: Medicaid Other | Admitting: Family Medicine

## 2020-09-01 ENCOUNTER — Other Ambulatory Visit (HOSPITAL_COMMUNITY)
Admission: RE | Admit: 2020-09-01 | Discharge: 2020-09-01 | Disposition: A | Discharge status: Home / Self Care | Payer: Medicaid Other | Source: Ambulatory Visit | Attending: Family Medicine | Admitting: Family Medicine

## 2020-09-01 ENCOUNTER — Other Ambulatory Visit: Payer: Self-pay

## 2020-09-01 ENCOUNTER — Encounter: Payer: Self-pay | Admitting: Family Medicine

## 2020-09-01 VITALS — BP 178/94 | HR 71 | Ht 61.0 in | Wt 184.2 lb

## 2020-09-01 DIAGNOSIS — Z794 Long term (current) use of insulin: Secondary | ICD-10-CM

## 2020-09-01 DIAGNOSIS — Z7689 Persons encountering health services in other specified circumstances: Secondary | ICD-10-CM

## 2020-09-01 DIAGNOSIS — E1100 Type 2 diabetes mellitus with hyperosmolarity without nonketotic hyperglycemic-hyperosmolar coma (NKHHC): Secondary | ICD-10-CM | POA: Diagnosis not present

## 2020-09-01 DIAGNOSIS — I10 Essential (primary) hypertension: Secondary | ICD-10-CM

## 2020-09-01 LAB — POCT GLYCOSYLATED HEMOGLOBIN (HGB A1C): Hemoglobin A1C: 6.1 % — AB (ref 4.0–5.6)

## 2020-09-01 MED ORDER — CETIRIZINE HCL 10 MG PO TABS: 10.0000 mg | ORAL_TABLET | Freq: Every day | ORAL | 5 refills | Status: DC

## 2020-09-01 NOTE — Patient Instructions (Signed)
It was nice to see you again today,   You are doing a great job of eating healthy.    I will call you when I get the results of the tests when I get them.    Have a great day,   Clemetine Marker, MD

## 2020-09-02 LAB — RPR: RPR Ser Ql: NONREACTIVE

## 2020-09-02 LAB — HIV ANTIBODY (ROUTINE TESTING W REFLEX): HIV Screen 4th Generation wRfx: NONREACTIVE

## 2020-09-02 LAB — LDL CHOLESTEROL, DIRECT: LDL Direct: 87 mg/dL (ref 0–99)

## 2020-09-03 DIAGNOSIS — Z7689 Persons encountering health services in other specified circumstances: Secondary | ICD-10-CM | POA: Insufficient documentation

## 2020-09-03 DIAGNOSIS — Z113 Encounter for screening for infections with a predominantly sexual mode of transmission: Secondary | ICD-10-CM | POA: Insufficient documentation

## 2020-09-03 NOTE — Assessment & Plan Note (Signed)
Well-controlled on current regimen.  Latest A1c 6.1.  Patient happy with this.  Normal foot exam.  Continue current medications.

## 2020-09-03 NOTE — Assessment & Plan Note (Signed)
Blood pressure elevated today.  Patient states she did not take her medicine yet this morning.  Advised patient to take her medicine and recheck at home.  She has a home blood pressure monitor and checks often.  Usually in the normotensive range under 130.  Advised her to call us if blood pressure does not improve

## 2020-09-03 NOTE — Assessment & Plan Note (Signed)
Patient had recent sexual encounter with a female partner and did not use condoms.  Would like to get tested for sexually transmitted infections.  Currently asymptomatic.  We will get blood tests and swab and follow-up with results

## 2020-09-04 LAB — CERVICOVAGINAL ANCILLARY ONLY
Bacterial Vaginitis (gardnerella): NEGATIVE
Candida Glabrata: NEGATIVE
Candida Vaginitis: NEGATIVE
Chlamydia: NEGATIVE
Comment: NEGATIVE
Comment: NEGATIVE
Comment: NEGATIVE
Comment: NEGATIVE
Comment: NEGATIVE
Comment: NORMAL
Neisseria Gonorrhea: NEGATIVE
Trichomonas: NEGATIVE

## 2020-09-15 ENCOUNTER — Telehealth: Payer: Self-pay

## 2020-09-15 NOTE — Telephone Encounter (Signed)
Patient calls nurse line wanting to increase Cetirizine prescription. Patients main concern is watery eyes, and they appear puffy an swollen. Patient reports she is taking one tablet per day, however does not feel like one is enough. Patient is unsure if she can take 2 per day, or if something else could be called in.

## 2020-09-18 ENCOUNTER — Other Ambulatory Visit: Payer: Self-pay | Admitting: Family Medicine

## 2020-09-18 MED ORDER — FLUTICASONE PROPIONATE 50 MCG/ACT NA SUSP: 1.0000 | Freq: Every day | NASAL | 12 refills | Status: DC

## 2020-09-18 NOTE — Telephone Encounter (Signed)
I do not see any flonase or equivalent on her med list.  She can use tht if not already using an intransal steroid.  Please let her know I will send it in but she can also pick it up over the counter.

## 2020-09-19 ENCOUNTER — Ambulatory Visit: Payer: Medicaid Other | Admitting: Podiatry

## 2020-09-19 NOTE — Telephone Encounter (Signed)
Patient reports she can not do any nasal meds due to not having tonsils. Patient reports nasal medications go right "through me." Patient is requesting her allergy pill to be changed altogether. Please advise on alternatives.

## 2020-09-22 ENCOUNTER — Other Ambulatory Visit: Payer: Self-pay | Admitting: Family Medicine

## 2020-09-22 MED ORDER — LORATADINE 10 MG PO TABS: 10.0000 mg | ORAL_TABLET | Freq: Every day | ORAL | 11 refills | Status: DC

## 2020-09-22 NOTE — Telephone Encounter (Signed)
I changed it from zyrtec to Claritin.

## 2020-09-26 ENCOUNTER — Other Ambulatory Visit: Payer: Self-pay

## 2020-09-26 ENCOUNTER — Encounter: Payer: Self-pay | Admitting: Podiatry

## 2020-09-26 ENCOUNTER — Ambulatory Visit (INDEPENDENT_AMBULATORY_CARE_PROVIDER_SITE_OTHER): Payer: Medicaid Other | Admitting: Podiatry

## 2020-09-26 DIAGNOSIS — Z800 Family history of malignant neoplasm of digestive organs: Secondary | ICD-10-CM | POA: Insufficient documentation

## 2020-09-26 DIAGNOSIS — B351 Tinea unguium: Secondary | ICD-10-CM | POA: Diagnosis not present

## 2020-09-26 DIAGNOSIS — E0843 Diabetes mellitus due to underlying condition with diabetic autonomic (poly)neuropathy: Secondary | ICD-10-CM

## 2020-09-26 DIAGNOSIS — L600 Ingrowing nail: Secondary | ICD-10-CM

## 2020-09-26 DIAGNOSIS — Z86010 Personal history of colonic polyps: Secondary | ICD-10-CM | POA: Insufficient documentation

## 2020-09-26 DIAGNOSIS — M79676 Pain in unspecified toe(s): Secondary | ICD-10-CM

## 2020-10-01 NOTE — Progress Notes (Signed)
Subjective: Kayla Gregory presents today for follow up of at risk foot care. Pt has h/o NIDDM with chronic kidney disease and painful mycotic nails b/l that are difficult to trim. Pain interferes with ambulation. Aggravating factors include wearing enclosed shoe gear. Pain is relieved with periodic professional debridement.   She voices no new pedal problems on today's visit. PCP is Dr. Addison Naegeli. Last visit was 09/01/2020.  Allergies  Allergen Reactions  . Metformin And Related Diarrhea and Other (See Comments)    GI symptoms including abdominal pain and diarrhea - Pt not interested in taking again     Objective: There were no vitals filed for this visit.  Pt is a pleasant 60 y.o. year old AA female WD, WN in NAD. AAO x 3.   Vascular Examination:  Capillary refill time to digits immediate b/l. Palpable DP pulses b/l. Palpable PT pulses b/l. Pedal hair sparse b/l. Skin temperature gradient within normal limits b/l. No edema noted b/l.  Dermatological Examination: Pedal skin with normal turgor, texture and tone bilaterally. No open wounds bilaterally. No interdigital macerations bilaterally. Toenails 1-5 b/l elongated, dystrophic, thickened, crumbly with subungual debris and tenderness to dorsal palpation. Incurvated nailplate b/l great toes with tenderness to palpation. No erythema, no edema, no drainage noted.  She does have early callus formation left hallux IPJ. No erythema, no edema, no drainage, no flocculence.  Musculoskeletal: Normal muscle strength 5/5 to all lower extremity muscle groups bilaterally. No gross bony deformities bilaterally. No pain crepitus or joint limitation noted with ROM b/l. Patient ambulates independent of any assistive aids.  Neurological: Protective sensation intact 5/5 intact bilaterally with 10g monofilament b/l. Vibratory sensation intact b/l. Proprioception intact bilaterally. Babinski reflex negative b/l. Clonus negative b/l.  Assessment: 1.  Pain due to onychomycosis of toenail   2. Ingrown toenail without infection   3. Diabetes mellitus due to underlying condition with diabetic autonomic neuropathy, unspecified whether long term insulin use ([place])     Plan: -Continue diabetic foot care principles. Literature dispensed on today.  -Toenails 1-5 b/l were debrided in length and girth with sterile nail nippers and dremel without iatrogenic bleeding. Offending nail borders debrided and curretaged b/l great toes. Borders cleansed with alcohol. Antibiotic ointment applied. No further treatment required by patient. -Continue pumice stone to left hallux callus. -Patient to continue soft, supportive shoe gear daily. -Patient to report any pedal injuries to medical professional immediately. -Patient/POA to call should there be question/concern in the interim.  Return in about 3 months (around 12/24/2020).

## 2020-10-23 ENCOUNTER — Other Ambulatory Visit: Payer: Self-pay | Admitting: Family Medicine

## 2020-10-24 NOTE — Telephone Encounter (Signed)
Can we call this patient and ask her if she knows why she was switched from '40mg'$  to '20mg'$  pantoprazole on 2/8? It looks like she went to the podiatrist that day.

## 2020-10-25 NOTE — Telephone Encounter (Signed)
Contacted pt and as far as she know she is taking 40 mg. Dr. Jeannine Kitten informed in clinic of this. Kayla Gregory, CMA

## 2020-11-02 NOTE — Telephone Encounter (Signed)
Error

## 2020-11-22 ENCOUNTER — Other Ambulatory Visit: Payer: Self-pay | Admitting: Family Medicine

## 2020-11-22 DIAGNOSIS — E1100 Type 2 diabetes mellitus with hyperosmolarity without nonketotic hyperglycemic-hyperosmolar coma (NKHHC): Secondary | ICD-10-CM

## 2020-11-22 DIAGNOSIS — Z794 Long term (current) use of insulin: Secondary | ICD-10-CM

## 2020-12-11 ENCOUNTER — Telehealth: Payer: Self-pay | Admitting: *Deleted

## 2020-12-11 NOTE — Telephone Encounter (Signed)
Pt called stating she is due to have COVID booster on 12/12/20 but she can not find her immunization card; her 1st 2 vaccines were given at [facility] and the 3rd was given at [facility]; appt verified; she was informed that this information is available on line; the pt says she does not have access to a computer; pt advised to let site know and they may be able to give her a duplicate card or print out of vaccinations; the pt says she will also contact [facility] for her record.

## 2020-12-12 ENCOUNTER — Other Ambulatory Visit: Payer: Self-pay

## 2020-12-12 ENCOUNTER — Ambulatory Visit: Payer: Medicaid Other | Attending: Internal Medicine

## 2020-12-12 DIAGNOSIS — Z23 Encounter for immunization: Secondary | ICD-10-CM

## 2020-12-12 NOTE — Progress Notes (Signed)
   Covid-19 Vaccination Clinic  Name:  KORTNE ZODA    MRN: [id number] DOB: 1961-02-06  12/12/2020  Ms. Spinola was observed post Covid-19 immunization for 15 minutes without incident. She was provided with Vaccine Information Sheet and instruction to access the V-Safe system.   Ms. Crespi was instructed to call 911 with any severe reactions post vaccine: Marland Kitchen Difficulty breathing  . Swelling of face and throat  . A fast heartbeat  . A bad rash all over body  . Dizziness and weakness   Immunizations Administered    Name Date Dose VIS Date Route   Moderna Covid-19 Booster Vaccine 12/12/2020  3:08 PM 0.25 mL 06/07/2020 Intramuscular   Manufacturer: Moderna   Lot: [id number]   [place][id number]

## 2020-12-14 NOTE — Progress Notes (Signed)
SUBJECTIVE:   CHIEF COMPLAINT / HPI:   DM: taking 40 Units once a day.  Changed her diet to remove junk food.  Pt has lost weight recently and attributes it to walking daily around the park.  Patient has run out of pen needles for her insulin and has been using her mother's states some of them are broken and she has to throw them away. Patient has been having occasional episodes of feeling 'hot' and weak at night.  Does not check her blood sugar during that time.  Takes her blood sugar reading once a day.  Has states some blood sugar readings have been in the double digits recently.    Patient has a 'boil' on her vagina.  She put some raw fatback on it and states now it is gone.  She has vaginal irritation, no discharge.  No dysuria.  Still has occasional vaginal bleeding that has been worked up in the past.   HTN: patient unsure which medications she is taking for BP.  Has two arb/hctz combo pills listed as sent in recently.  Unsure if she is taking both.  Will call us later today with the list of meds she is taking.  States she has not taken her BP medications yet today and that is why her BP is so high.      PERTINENT  PMH / PSH: HTN, DM,   OBJECTIVE:   BP (!) 176/92   Pulse 71   Ht '5\' 1"'$  (1.549 m)   Wt 177 lb (80.3 kg)   SpO2 94%   BMI 33.44 kg/m   Gen: alert, oriented.  No acute distress.  CV: RRR Pulm: lctab GU: small raised lesion 1.3cm in diameter. lateral to the right labia.  Nontender.  Not erythematous or warm.  No discharge.    ASSESSMENT/PLAN:   DM (diabetes mellitus) ([place]) Patient still has better than optimal control of her DM2 (6.2 a1c today). She has been extremely resistant in the past to changing her regimen.  Previous physician had decreased her dose of insulin and patient continued to use the same dose and started using her mother's insulin when she would run out.  I increased the patient's lantus back to her 40 U and at that time her a1c was ~7 and she was  not having symtpomatic hypoglycemic episodes.  Her a1c is now 6.1, due to her diet change and exercise, and she may be starting to have symptomatic hypoglycemic episodes at night when she is complaining about 'hot flashes' and feeling weak.  I discussed in depth with the patient that these may be hypoglycemia and she should check her blood sugar when she has these episodes.  I am hesitant to decrease her lantus as I don't think she will decrease it and she is still using her mother's supplies (pen needles) when she needs to. Will send in prescription for new pen needles as she has run out early due to 'defective' needles.  Pt up to date on her cancer screenings.  Will get cbc to look for other possible causes of her 'hot flashes' at night.   Boil of groin Is not tender or erythematous.  No drainage. I advised the patient not to put raw meat on it as this can lead to infection. Advised her to use warm compresses to help facilitate drainage.  No signs or symptoms of systemic infection.  If this does not improve can consider I/D in the future. Also getting sti testing  as pt has been sexually active recently and desires testing.   Essential hypertension Patient is unsure of her BP meds.  Unsure if she is taking both of the arb/hctz combos listed.  Will have pt call in with meds when she gets home.  This is now two visits in a row where her bp has been in the 170s and pt has attributed it to not taking her medication yet that day.  I advised the patient to make an appointment with our pharmacist for 24 hour blood pressure monitoring to get accurate bp readings.  Will forward this to pharm team too.       Benay Pike, MD [place]

## 2020-12-15 ENCOUNTER — Other Ambulatory Visit (HOSPITAL_BASED_OUTPATIENT_CLINIC_OR_DEPARTMENT_OTHER): Payer: Self-pay

## 2020-12-15 ENCOUNTER — Other Ambulatory Visit: Payer: Self-pay

## 2020-12-15 ENCOUNTER — Encounter: Payer: Self-pay | Admitting: Family Medicine

## 2020-12-15 ENCOUNTER — Telehealth: Payer: Self-pay

## 2020-12-15 ENCOUNTER — Ambulatory Visit (INDEPENDENT_AMBULATORY_CARE_PROVIDER_SITE_OTHER): Payer: Medicaid Other | Admitting: Family Medicine

## 2020-12-15 ENCOUNTER — Other Ambulatory Visit (HOSPITAL_COMMUNITY)
Admission: RE | Admit: 2020-12-15 | Discharge: 2020-12-15 | Disposition: A | Discharge status: Home / Self Care | Payer: Medicaid Other | Source: Ambulatory Visit | Attending: Family Medicine | Admitting: Family Medicine

## 2020-12-15 VITALS — BP 176/92 | HR 71 | Ht 61.0 in | Wt 177.0 lb

## 2020-12-15 DIAGNOSIS — L02224 Furuncle of groin: Secondary | ICD-10-CM

## 2020-12-15 DIAGNOSIS — Z794 Long term (current) use of insulin: Secondary | ICD-10-CM

## 2020-12-15 DIAGNOSIS — N898 Other specified noninflammatory disorders of vagina: Secondary | ICD-10-CM | POA: Diagnosis not present

## 2020-12-15 DIAGNOSIS — E1100 Type 2 diabetes mellitus with hyperosmolarity without nonketotic hyperglycemic-hyperosmolar coma (NKHHC): Secondary | ICD-10-CM | POA: Diagnosis not present

## 2020-12-15 DIAGNOSIS — I10 Essential (primary) hypertension: Secondary | ICD-10-CM | POA: Diagnosis not present

## 2020-12-15 LAB — POCT URINE PREGNANCY: Preg Test, Ur: NEGATIVE

## 2020-12-15 LAB — POCT GLYCOSYLATED HEMOGLOBIN (HGB A1C): HbA1c, POC (controlled diabetic range): 6.2 % (ref 0.0–7.0)

## 2020-12-15 MED ORDER — COVID-19 MRNA VACC (MODERNA) 100 MCG/0.5ML IM SUSP
INTRAMUSCULAR | 0 refills | Status: DC
  Filled 2020-12-15: qty 0.25, 1d supply, fill #0

## 2020-12-15 NOTE — Patient Instructions (Signed)
It was nice to see you today,  For your blood pressure I would like you to the following - When you go home find the names of the medications you are taking and call us to report back the names of all the medicines you are taking.  - On your way out please schedule an appointment with our pharmacy team for 24-hour blood pressure monitoring.  This will involve 2 visits over 2 days to get it set up and then bring it back.  For your blood sugar, I would like you to do the following: - I think your hot flashes at night are possibly due to hypoglycemic episodes because you have lost weight and are eating better.  Seriously consider decreasing your Lantus dose.  If you feel sweaty or weak again take your blood sugar at that time to see if it is too low.  If it is drink something sugary.  I will call you when the results of your other tests come back.  You can ask our office when you call later today with the results of the pregnancy test is.  Please schedule an appointment with me for same day that you have your blood pressure meter returned.  Have a great day,  Clemetine Marker, MD

## 2020-12-15 NOTE — Telephone Encounter (Signed)
Patient calls nurse line reporting current medications. Patient stated she did not bring them to apt this morning with PCP. See below.   Valsartan-Hydrochlorothiazide 80-12.'5mg'$  daily  Pantoprazole '40mg'$  daily  Aspirin 81 daily  Gabapentin '300mg'$  TID Loratadine '10mg'$  daily  Centrum Adult Vitamin daily

## 2020-12-16 LAB — CBC
Hematocrit: 34.4 % (ref 34.0–46.6)
Hemoglobin: 11.6 g/dL (ref 11.1–15.9)
MCH: 30.9 pg (ref 26.6–33.0)
MCHC: 33.7 g/dL (ref 31.5–35.7)
MCV: 92 fL (ref 79–97)
Platelets: 204 10*3/uL (ref 150–450)
RBC: 3.76 x10E6/uL — ABNORMAL LOW (ref 3.77–5.28)
RDW: 12.4 % (ref 11.7–15.4)
WBC: 5.4 10*3/uL (ref 3.4–10.8)

## 2020-12-16 LAB — BASIC METABOLIC PANEL
BUN/Creatinine Ratio: 13 (ref 12–28)
BUN: 39 mg/dL — ABNORMAL HIGH (ref 8–27)
CO2: 22 mmol/L (ref 20–29)
Calcium: 9.1 mg/dL (ref 8.7–10.3)
Chloride: 106 mmol/L (ref 96–106)
Creatinine, Ser: 2.99 mg/dL — ABNORMAL HIGH (ref 0.57–1.00)
Glucose: 81 mg/dL (ref 65–99)
Potassium: 4.3 mmol/L (ref 3.5–5.2)
Sodium: 145 mmol/L — ABNORMAL HIGH (ref 134–144)
eGFR: 17 mL/min/{1.73_m2} — ABNORMAL LOW (ref >59)

## 2020-12-16 LAB — HIV ANTIBODY (ROUTINE TESTING W REFLEX): HIV Screen 4th Generation wRfx: NONREACTIVE

## 2020-12-16 LAB — RPR: RPR Ser Ql: NONREACTIVE

## 2020-12-18 ENCOUNTER — Other Ambulatory Visit: Payer: Self-pay | Admitting: Family Medicine

## 2020-12-18 DIAGNOSIS — N1832 Chronic kidney disease, stage 3b: Secondary | ICD-10-CM

## 2020-12-18 DIAGNOSIS — L02224 Furuncle of groin: Secondary | ICD-10-CM | POA: Insufficient documentation

## 2020-12-18 LAB — CERVICOVAGINAL ANCILLARY ONLY
Bacterial Vaginitis (gardnerella): NEGATIVE
Candida Glabrata: NEGATIVE
Candida Vaginitis: NEGATIVE
Chlamydia: NEGATIVE
Comment: NEGATIVE
Comment: NEGATIVE
Comment: NEGATIVE
Comment: NEGATIVE
Comment: NEGATIVE
Comment: NORMAL
Neisseria Gonorrhea: NEGATIVE
Trichomonas: NEGATIVE

## 2020-12-18 MED ORDER — BD PEN NEEDLE SHORT U/F 31G X 8 MM MISC: 1 refills | Status: DC

## 2020-12-18 NOTE — Assessment & Plan Note (Addendum)
Patient still has better than optimal control of her DM2 (6.2 a1c today). She has been extremely resistant in the past to changing her regimen.  Previous physician had decreased her dose of insulin and patient continued to use the same dose and started using her mother's insulin when she would run out.  I increased the patient's lantus back to her 40 U and at that time her a1c was ~7 and she was not having symtpomatic hypoglycemic episodes.  Her a1c is now 6.1, due to her diet change and exercise, and she may be starting to have symptomatic hypoglycemic episodes at night when she is complaining about 'hot flashes' and feeling weak.  I discussed in depth with the patient that these may be hypoglycemia and she should check her blood sugar when she has these episodes.  I am hesitant to decrease her lantus as I don't think she will decrease it and she is still using her mother's supplies (pen needles) when she needs to. Will send in prescription for new pen needles as she has run out early due to 'defective' needles.  Pt up to date on her cancer screenings.  Will get cbc to look for other possible causes of her 'hot flashes' at night.

## 2020-12-18 NOTE — Assessment & Plan Note (Signed)
Patient is unsure of her BP meds.  Unsure if she is taking both of the arb/hctz combos listed.  Will have pt call in with meds when she gets home.  This is now two visits in a row where her bp has been in the 170s and pt has attributed it to not taking her medication yet that day.  I advised the patient to make an appointment with our pharmacist for 24 hour blood pressure monitoring to get accurate bp readings.  Will forward this to pharm team too.

## 2020-12-18 NOTE — Addendum Note (Signed)
Addended by: Benay Pike on: 12/18/2020 01:43 PM   Modules accepted: Orders

## 2020-12-18 NOTE — Assessment & Plan Note (Addendum)
Is not tender or erythematous.  No drainage. I advised the patient not to put raw meat on it as this can lead to infection. Advised her to use warm compresses to help facilitate drainage.  No signs or symptoms of systemic infection.  If this does not improve can consider I/D in the future. Also getting sti testing as pt has been sexually active recently and desires testing.

## 2020-12-19 ENCOUNTER — Other Ambulatory Visit: Payer: Self-pay | Admitting: Family Medicine

## 2020-12-19 NOTE — Progress Notes (Signed)
Patient calls nurse line reporting current medications. Patient stated she did not bring them to apt this morning with PCP. See below.   Valsartan-Hydrochlorothiazide 80-12.'5mg'$  daily  Pantoprazole '40mg'$  daily  Aspirin 81 daily  Gabapentin '300mg'$  TID Loratadine '10mg'$  daily  Centrum Adult Vitamin daily

## 2020-12-28 ENCOUNTER — Other Ambulatory Visit: Payer: Self-pay

## 2020-12-28 ENCOUNTER — Encounter: Payer: Self-pay | Admitting: Pharmacist

## 2020-12-28 ENCOUNTER — Ambulatory Visit (INDEPENDENT_AMBULATORY_CARE_PROVIDER_SITE_OTHER): Payer: Medicaid Other | Admitting: Pharmacist

## 2020-12-28 DIAGNOSIS — I10 Essential (primary) hypertension: Secondary | ICD-10-CM

## 2020-12-28 NOTE — Progress Notes (Signed)
   S:    Patient arrives frustrated regarding medication changes and recent visit request to go to [facility]" .  She was reluctant to do the blood pressure monitoring test.  She expressed concern related to having numerous resident physicians and taking medications that were being changed too often.  She expressed interest in returning to valsartan/HCTZ at the higher dose '160mg'$ /'25mg'$ . Presents to the clinic for ambulatory blood pressure evaluation.   Patient was referred and last seen by Primary Care Provider on 12/15/20.   Diagnosed with Hypertension several years ago, patient was unable to share earliest time of Dx.  Medication compliance is reported to be good. Pt expressed frustration regarding why valsartan/HCTZ was reduced from 160/12.5 mg to 80/12.5 mg.   Discussed procedure for wearing the monitor and gave patient written instructions. Monitor was placed on non-dominant arm with instructions to return in the morning.   Current BP Medications include:  Valsartan 80/HCTZ 12.5 mg daily  Antihypertensives tried in the past include: amlodipine (leg swelling)  Dietary habits include:  O:   Physical Exam Pulmonary:     Effort: Pulmonary effort is normal.  Neurological:     Mental Status: She is alert.      ROS  Last 3 Office BP readings: BP Readings from Last 3 Encounters:  12/15/20 (!) 176/92  09/01/20 (!) 178/94  05/11/20 (!) 165/80   Clinical Atherosclerotic Cardiovascular Disease (ASCVD): No  The 10-year ASCVD risk score Mikey Bussing DC Jr., et al., 2013) is: 29.8%   Values used to calculate the score:     Age: 89 years     Sex: Female     Is Non-Hispanic African American: Yes     Diabetic: Yes     Tobacco smoker: No     Systolic Blood Pressure: 0000000 mmHg     Is BP treated: Yes     HDL Cholesterol: 54 mg/dL     Total Cholesterol: 150 mg/dL  Basic Metabolic Panel    Component Value Date/Time   NA 145 (H) 12/15/2020 0926   K 4.3 12/15/2020 0926   CL 106 12/15/2020  0926   CO2 22 12/15/2020 0926   GLUCOSE 81 12/15/2020 0926   GLUCOSE 132 (H) 12/24/2019 1759   BUN 39 (H) 12/15/2020 0926   CREATININE 2.99 (H) 12/15/2020 0926   CALCIUM 9.1 12/15/2020 0926   GFRNONAA 24 (L) 04/13/2020 1110   GFRAA 28 (L) 04/13/2020 1110    Renal function: Estimated Creatinine Clearance: 18.7 mL/min (A) (by C-G formula based on SCr of 2.99 mg/dL (H)).   A/P: History of hypertension since 2015 - ambulatory blood pressure monitor placed.     Procedure reviewed in detail and written information provided.  Total time in face-to-face counseling 20 minutes.   F/U Clinic tomorrow for BP evaluation.   Patient seen with Marlowe Alt PharmD Candidate, Norina Buzzard, PharmD - PGY-1 Resident. and Lorel Monaco, PharmD, BCPS - PGY2 Pharmacy Resident.

## 2020-12-28 NOTE — Patient Instructions (Signed)
Blood Pressure Activity Diary Time Lying down/ Sleeping Walking/ Exercise Stressed/ Angry Headache/ Pain Dizzy  9 AM       10 AM       11 AM       12 PM       1 PM       2 PM       Time Lying down/ Sleeping Walking/ Exercise Stressed/ Angry Headache/ Pain Dizzy  3 PM       4 PM        5 PM       6 PM       7 PM       8 PM       Time Lying down/ Sleeping Walking/ Exercise Stressed/ Angry Headache/ Pain Dizzy  9 PM       10 PM       11 PM       12 AM       1 AM       2 AM       3 AM       Time Lying down/ Sleeping Walking/ Exercise Stressed/ Angry Headache/ Pain Dizzy  4 AM       5 AM       6 AM       7 AM       8 AM       9 AM       10 AM        Time you woke up: _________                  Time you went to sleep:__________   Come back tomorrow at 8:30am to have the monitor removed  Call the [facility] if you have any questions before then [phone])  Wearing the Blood Pressure Monitor  The cuff will inflate every 20 minutes during the day and every 30 minutes while you sleep.  Your blood pressure readings will NOT display after cuff inflation  Fill out the blood pressure-activity diary during the day, especially during activities that may affect your reading -- such as exercise, stress, walking, taking your blood pressure medications  Important things to know:  Avoid taking the monitor off for the next 24 hours, unless it causes you discomfort or pain.  Do NOT get the monitor wet and do NOT dry to clean the monitor with any cleaning products.  Do NOT put the monitor on anyone else's arm.  When the cuff inflates, avoid excess movement. Let the cuffed arm hang loosely, slightly away from the body. Avoid flexing the muscles or moving the hand/fingers.  When you go to sleep, make sure that the hose is not kinked.  Remember to fill out the blood pressure activity diary.  If you experience severe pain or unusual pain (not associated with getting  your blood pressure checked), remove the monitor.  Troubleshooting:  Code  Troubleshooting   1  Check cuff position, tighten cuff   2, 3  Remain still during reading   4, 87  Check air hose connections and make sure cuff is tight   85, 89  Check hose connections and make tubing is not crimped   86  Push START/STOP to restart reading   88, 91  Retry by pushing START/STOP   90  Replace batteries. If problem persists, remove monitor and bring back to   clinic at follow up   97, 98, 99  Service required - Remove monitor and bring back to clinic at follow up

## 2020-12-29 ENCOUNTER — Encounter: Payer: Self-pay | Admitting: Pharmacist

## 2020-12-29 ENCOUNTER — Ambulatory Visit (INDEPENDENT_AMBULATORY_CARE_PROVIDER_SITE_OTHER): Payer: Medicaid Other | Admitting: Pharmacist

## 2020-12-29 ENCOUNTER — Other Ambulatory Visit: Payer: Self-pay

## 2020-12-29 DIAGNOSIS — I10 Essential (primary) hypertension: Secondary | ICD-10-CM

## 2020-12-29 MED ORDER — VALSARTAN-HYDROCHLOROTHIAZIDE 160-25 MG PO TABS: 1.0000 | ORAL_TABLET | Freq: Every day | ORAL | 11 refills | Status: DC

## 2020-12-29 NOTE — Assessment & Plan Note (Signed)
History of hypertension since 2015 and found to have white coat hypertension (blood pressure not at goal)  Given 24-hour ambulatory blood pressure demonstrates isolated systolic hypertension with an average blood pressure of 159/84 mmHg, and a nocturnal dipping pattern that is borderline. Pt reports not sleeping well with BP cuff, so dipping pattern may be normal. Changes to medications increased to valsartan/HCTZ 160-25 mg daily. New prescription sent to pharmacy. Patient verbalized understanding and agreeable to plan.  -Discussed with patient about trying clonidine 0.'1mg'$  BID at next visit with PCP if BP still remains elevated.

## 2020-12-29 NOTE — Assessment & Plan Note (Signed)
History of hypertension since 2015.  Amb BP monitor placed.

## 2020-12-29 NOTE — Progress Notes (Signed)
S:    Patient arrives in good spirits and brings her Amb BP monitor. Presents to the clinic for ambulatory blood pressure evaluation. Patient was seen yesterday (5/12) for Amb BP placement.   Diagnosed with Hypertension in the year of 2015.    Medication compliance is reported to be compliant.  Current BP Medications include:  Valsartan/HCTZ 80-12.5 mg daily  O:   Physical Exam Constitutional:      Appearance: Normal appearance.  Pulmonary:     Effort: Pulmonary effort is normal.  Neurological:     Mental Status: She is alert.  Psychiatric:        Mood and Affect: Mood normal.        Behavior: Behavior normal.        Thought Content: Thought content normal.        Judgment: Judgment normal.     Review of Systems  All other systems reviewed and are negative.   Last 3 Office BP readings: BP Readings from Last 3 Encounters:  12/28/20 (!) 194/95  12/15/20 (!) 176/92  09/01/20 (!) 178/94    Clinical Atherosclerotic Cardiovascular Disease (ASCVD): Yes  The 10-year ASCVD risk score Mikey Bussing DC Jr., et al., 2013) is: 37%   Values used to calculate the score:     Age: 60 years     Sex: Female     Is Non-Hispanic African American: Yes     Diabetic: Yes     Tobacco smoker: No     Systolic Blood Pressure: [id number] mmHg     Is BP treated: Yes     HDL Cholesterol: 54 mg/dL     Total Cholesterol: 150 mg/dL  Basic Metabolic Panel    Component Value Date/Time   NA 145 (H) 12/15/2020 0926   K 4.3 12/15/2020 0926   CL 106 12/15/2020 0926   CO2 22 12/15/2020 0926   GLUCOSE 81 12/15/2020 0926   GLUCOSE 132 (H) 12/24/2019 1759   BUN 39 (H) 12/15/2020 0926   CREATININE 2.99 (H) 12/15/2020 0926   CALCIUM 9.1 12/15/2020 0926   GFRNONAA 24 (L) 04/13/2020 1110   GFRAA 28 (L) 04/13/2020 1110    Renal function: Estimated Creatinine Clearance: 18.7 mL/min (A) (by C-G formula based on SCr of 2.99 mg/dL (H)).  ABPM Study Data: Arm Placement right arm   Overall Mean 24hr BP:    159/84 mmHg HR: 60   Daytime Mean BP:  163/90 mmHg HR: 63   Nighttime Mean BP:  151/70 mmHg HR: 53   Dipping Pattern: Borderline. Sys:   7.7%   Dia: 22.8%   [normal dipping ~10-20%]  Non-hypertensive ABPM thresholds: daytime BP <125/75 mmHg, sleeptime BP <120/70 mmHg    A/P: History of hypertension since 2015 and found to have white coat hypertension (blood pressure not at goal)  Given 24-hour ambulatory blood pressure demonstrates isolated systolic hypertension with an average blood pressure of 159/84 mmHg, and a nocturnal dipping pattern that is borderline. Pt reports not sleeping well with BP cuff, so dipping pattern may be normal. Changes to medications increased to valsartan/HCTZ 160-25 mg daily. New prescription sent to pharmacy. Patient verbalized understanding and agreeable to plan.  -Discussed with patient about trying clonidine 0.'1mg'$  BID at next visit with PCP if BP still remains elevated.    Results reviewed and written information provided.  Total time in face-to-face counseling 30 minutes.    F/U Clinic Visit with Dr. Jeannine Kitten by end of June. Patient seen with Marlowe Alt PharmD Candidate, and Judson Roch  Jerelene Redden, PharmD - PGY-1 Resident.

## 2020-12-29 NOTE — Progress Notes (Signed)
Reviewed, I agree with Dr. Koval's documentation and management. 

## 2020-12-29 NOTE — Patient Instructions (Signed)
Nice to see you today.  Thanks for completing the blood pressure test.    1. Stop Valsartan/HCTZ 80/12.'5mg'$  dally  2. Start Valsartan/HCTZ 160/'25mg'$  once daily.  Next visit with Dr. Jeannine Kitten.  Please schedule within the next 4-5 weeks.

## 2020-12-29 NOTE — Progress Notes (Signed)
Reviewed: I agree with Dr. Koval's documentation and management. 

## 2021-01-05 ENCOUNTER — Ambulatory Visit: Payer: Medicaid Other | Admitting: Podiatry

## 2021-01-10 NOTE — Progress Notes (Signed)
    SUBJECTIVE:   CHIEF COMPLAINT / HPI:   HTN: recently increased her valsartan/hctz to 160/25.  Has been compliant with this change. Her blood pressure readings at home have been better than what we read in the office.   Itching: patient has been having itching in both of her legs below the knees in the past few months.  She puts cocoa butter and shea butter on it.  She does not believe they are bug bites.  No bleeding or discharge.    DM:  Appointment with ophthomology on the third of june   CKD: patient states she has not heard from the nephrologist we referred her to yet.  States she is willing to see them. We discussed the importance of seeing a nephrologist in the setting of her kidney disease.   PERTINENT  PMH / PSH: ckd, dm, htn,   OBJECTIVE:   BP (!) 170/78   Pulse (!) 59   Ht '5\' 1"'$  (1.549 m)   Wt 174 lb 9.6 oz (79.2 kg)   SpO2 95%   BMI 32.99 kg/m   General: Alert and oriented. Skin: Flat circular lesions that look like healing over the left and right lower extremities below the knee.  See media tab for picture.  ASSESSMENT/PLAN:   Essential hypertension High after increasing dose in the office.  Patient brought in home readings that are better though.  Provided patient with blood pressure log sheet and advised her to check it once in the morning and once in the evening every day and bring it back to [place] at her next visit in 2 weeks.  Advised her to continue medication.  We will get BMP today.  Urticaria Itching bilaterally on her lower legs for the past few months.  Patient has lesions that look like excoriations from scratching.  Will use Sarna and other nonsteroidal topicals and advised patient to not scratch.  We will then reevaluate if lesions are improving.  If not could consider biopsy.  See media tab for picture of lesion on left leg.  CKD (chronic kidney disease) stage 3, GFR 30-59 ml/min (HCC) Patient states she [facility].  I  checked and from our end it looks like the referral was sent.  Gave patient the number and advised her to call.  We will also forward message to our referral coordinator to see if she can follow-up with [facility].  I stressed the importance of establishing care with [facility] with the patient and discussed with her the progression of kidney dysfunction over the past 2 years.  DM (diabetes mellitus) ([place]) Patient has an appointment with the ophthalmologist on 3 June.     Benay Pike, MD [place]

## 2021-01-11 ENCOUNTER — Encounter: Payer: Self-pay | Admitting: Family Medicine

## 2021-01-11 ENCOUNTER — Other Ambulatory Visit: Payer: Self-pay

## 2021-01-11 ENCOUNTER — Ambulatory Visit (INDEPENDENT_AMBULATORY_CARE_PROVIDER_SITE_OTHER): Payer: Medicaid Other | Admitting: Family Medicine

## 2021-01-11 VITALS — BP 170/78 | HR 59 | Ht 61.0 in | Wt 174.6 lb

## 2021-01-11 DIAGNOSIS — E1100 Type 2 diabetes mellitus with hyperosmolarity without nonketotic hyperglycemic-hyperosmolar coma (NKHHC): Secondary | ICD-10-CM

## 2021-01-11 DIAGNOSIS — I10 Essential (primary) hypertension: Secondary | ICD-10-CM

## 2021-01-11 DIAGNOSIS — L509 Urticaria, unspecified: Secondary | ICD-10-CM

## 2021-01-11 DIAGNOSIS — Z794 Long term (current) use of insulin: Secondary | ICD-10-CM | POA: Diagnosis not present

## 2021-01-11 DIAGNOSIS — N1832 Chronic kidney disease, stage 3b: Secondary | ICD-10-CM

## 2021-01-11 NOTE — Assessment & Plan Note (Signed)
Patient has an appointment with the ophthalmologist on 3 June.

## 2021-01-11 NOTE — Patient Instructions (Addendum)
It was nice to see you today,  We did the following things today: - I checked your kidney function lab.  I will call you when I get the results.  - I think it is very important for you to go to [facility] to establish care with them.  We want to avoid your kidneys worsening to a point where you would need dialysis.  Please call them at this number:Phone: [phone]  - For your rash I think those are due to itching.  It is unclear what is causing the itching, but you can get stuck in what is called a "itch scratch cycle" in which the scratching causes more itching which leads to more scratching.  I would like you to use a lotion such as Eucerin eczema or Sarna anti-itch cream and put it on your legs throughout the day to help avoid scratching it.  - We will avoid changing your medication today.  We will follow-up your blood pressure again with your next visit.  Have a great day,  Clemetine Marker, MD

## 2021-01-11 NOTE — Assessment & Plan Note (Addendum)
High after increasing dose in the office.  Patient brought in home readings that are better though.  Provided patient with blood pressure log sheet and advised her to check it once in the morning and once in the evening every day and bring it back to [place] at her next visit in 2 weeks.  Advised her to continue medication.  We will get BMP today.

## 2021-01-11 NOTE — Assessment & Plan Note (Signed)
Patient states she has not heard from [facility].  I checked and from our end it looks like the referral was sent.  Gave patient the number and advised her to call.  We will also forward message to our referral coordinator to see if she can follow-up with [facility].  I stressed the importance of establishing care with [facility] with the patient and discussed with her the progression of kidney dysfunction over the past 2 years.

## 2021-01-11 NOTE — Assessment & Plan Note (Signed)
Itching bilaterally on her lower legs for the past few months.  Patient has lesions that look like excoriations from scratching.  Will use Sarna and other nonsteroidal topicals and advised patient to not scratch.  We will then reevaluate if lesions are improving.  If not could consider biopsy.  See media tab for picture of lesion on left leg.

## 2021-01-12 LAB — BASIC METABOLIC PANEL
BUN/Creatinine Ratio: 11 — ABNORMAL LOW (ref 12–28)
BUN: 33 mg/dL — ABNORMAL HIGH (ref 8–27)
CO2: 22 mmol/L (ref 20–29)
Calcium: 8.7 mg/dL (ref 8.7–10.3)
Chloride: 107 mmol/L — ABNORMAL HIGH (ref 96–106)
Creatinine, Ser: 3.08 mg/dL — ABNORMAL HIGH (ref 0.57–1.00)
Glucose: 123 mg/dL — ABNORMAL HIGH (ref 65–99)
Potassium: 4.5 mmol/L (ref 3.5–5.2)
Sodium: 144 mmol/L (ref 134–144)
eGFR: 17 mL/min/{1.73_m2} — ABNORMAL LOW (ref >59)

## 2021-01-16 ENCOUNTER — Telehealth: Payer: Self-pay

## 2021-01-16 NOTE — Telephone Encounter (Signed)
Patient calls nurse line requesting results from last weeks visit. Please advise.

## 2021-01-17 NOTE — Telephone Encounter (Signed)
Contacted patient and reported results. Patient stated she has an apt on 6/3 at [facility].

## 2021-01-17 NOTE — Telephone Encounter (Signed)
Can you let the patient know that her lab results were similar to her values a month ago and that it is important that she sees the nephrologist.  Please provide the patient with the number to contact [facility] if she does not have it.  It should be on her last AVS but she may have lost it.

## 2021-01-18 ENCOUNTER — Other Ambulatory Visit: Payer: Self-pay | Admitting: Family Medicine

## 2021-01-22 ENCOUNTER — Other Ambulatory Visit: Payer: Self-pay | Admitting: Family Medicine

## 2021-01-22 MED ORDER — ATORVASTATIN CALCIUM 40 MG PO TABS: ORAL_TABLET | ORAL | 1 refills | Status: DC

## 2021-01-23 ENCOUNTER — Other Ambulatory Visit: Payer: Self-pay | Admitting: Internal Medicine

## 2021-01-23 DIAGNOSIS — N184 Chronic kidney disease, stage 4 (severe): Secondary | ICD-10-CM

## 2021-01-30 ENCOUNTER — Ambulatory Visit (INDEPENDENT_AMBULATORY_CARE_PROVIDER_SITE_OTHER): Payer: Medicaid Other | Admitting: Family Medicine

## 2021-01-30 ENCOUNTER — Other Ambulatory Visit: Payer: Self-pay

## 2021-01-30 ENCOUNTER — Encounter: Payer: Self-pay | Admitting: Family Medicine

## 2021-01-30 DIAGNOSIS — I10 Essential (primary) hypertension: Secondary | ICD-10-CM | POA: Diagnosis not present

## 2021-01-30 DIAGNOSIS — N184 Chronic kidney disease, stage 4 (severe): Secondary | ICD-10-CM | POA: Diagnosis not present

## 2021-01-30 DIAGNOSIS — L282 Other prurigo: Secondary | ICD-10-CM | POA: Diagnosis not present

## 2021-01-30 NOTE — Progress Notes (Signed)
    SUBJECTIVE:   CHIEF COMPLAINT / HPI:   HTN: Patient has been taking her blood pressure medications as prescribed.  Has been recording her home blood pressure readings.  Has no concerns about her blood pressure today.  She started walking around the track at a local school.  Does about 2 laps.  Is trying to work her way up to at least 1 mile.  CKD: Patient recently spoke with the nephrologist.  States they put her on a new "thyroid medication".  She pulled out the medication which was rayaldee/calcifidiol, a vitamin D analog.  Has an appointment in 2 months for follow-up.  Rash: Patient states that she has been cutting cocoa Shea butter on the rash on her legs and arms.  This feels like it is getting better.  She did not pick up the disorder that I recommended at last visit.  Still has some itching.  PERTINENT  PMH / PSH: CKD, HTN  OBJECTIVE:   BP (!) 148/88   Pulse 60   Ht '5\' 1"'$  (1.549 m)   Wt 175 lb 9.6 oz (79.7 kg)   SpO2 95%   BMI 33.18 kg/m   General: Alert and oriented.  No acute distress CV: Regular rate and rhythm, no murmurs Pulmonary: Lungs good auscultation bilaterally MSK: Heberden's nodes on the third and fourth digits bilaterally at the DIP joints Skin: Healing flat excoriation marks on the shins and arms bilaterally.  ASSESSMENT/PLAN:   Essential hypertension Blood pressure better today.  Perhaps better due to recent exercise.  Patient brought in home readings which I have included in the media tab.  Most values are in the Q000111Q systolic range.  Make any changes today.  There may be some room for some more titration of her blood pressure medications, but patient is very particular about which medication she will and will not take and will need further convincing.  CKD (chronic kidney disease), stage IV [facility]) Patient went to a nephrologist and has another appointment with in 2 months.  Patient was started on a new vitamin D analog for secondary  hyperparathyroidism.  No other changes were made.  Pruritic rash Patient continues to use emollient and over-the-counter corticosteroid.  Lesions appear to be mostly healing.  Advised her to use the over-the-counter Sarna I recommended last visit to help with further itching.     Benay Pike, MD [place]

## 2021-01-30 NOTE — Patient Instructions (Signed)
It was nice to see you today,  You are doing a great job of checking your blood pressure and exercising.  Your blood pressure readings look much better.  Continue to exercise,eat a healthy diet, and taking your blood pressure medications.  For the itching I would recommend you use the Sarna that I discussed with the last time.  This is available over-the-counter at any pharmacy or grocery store.  You can apply it multiple times a day to the areas that itch.  I have enjoyed being your doctor.  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  Have a great day,  Clemetine Marker, MD

## 2021-01-31 NOTE — Assessment & Plan Note (Signed)
Patient continues to use emollient and over-the-counter corticosteroid.  Lesions appear to be mostly healing.  Advised her to use the over-the-counter Sarna I recommended last visit to help with further itching.

## 2021-01-31 NOTE — Assessment & Plan Note (Signed)
Blood pressure better today.  Perhaps better due to recent exercise.  Patient brought in home readings which I have included in the media tab.  Most values are in the Q000111Q systolic range.  Make any changes today.  There may be some room for some more titration of her blood pressure medications, but patient is very particular about which medication she will and will not take and will need further convincing.

## 2021-01-31 NOTE — Assessment & Plan Note (Signed)
Patient went to a nephrologist and has another appointment with in 2 months.  Patient was started on a new vitamin D analog for secondary hyperparathyroidism.  No other changes were made.

## 2021-02-06 ENCOUNTER — Ambulatory Visit
Admission: RE | Admit: 2021-02-06 | Discharge: 2021-02-06 | Disposition: A | Discharge status: Home / Self Care | Payer: Medicaid Other | Source: Ambulatory Visit | Attending: Internal Medicine | Admitting: Internal Medicine

## 2021-02-06 DIAGNOSIS — N184 Chronic kidney disease, stage 4 (severe): Secondary | ICD-10-CM

## 2021-02-21 ENCOUNTER — Other Ambulatory Visit: Payer: Self-pay | Admitting: Family Medicine

## 2021-03-13 ENCOUNTER — Other Ambulatory Visit: Payer: Self-pay | Admitting: Family Medicine

## 2021-03-13 DIAGNOSIS — Z1231 Encounter for screening mammogram for malignant neoplasm of breast: Secondary | ICD-10-CM

## 2021-03-14 ENCOUNTER — Ambulatory Visit (INDEPENDENT_AMBULATORY_CARE_PROVIDER_SITE_OTHER): Payer: Medicaid Other

## 2021-03-14 ENCOUNTER — Telehealth: Payer: Self-pay | Admitting: Podiatry

## 2021-03-14 ENCOUNTER — Ambulatory Visit (INDEPENDENT_AMBULATORY_CARE_PROVIDER_SITE_OTHER): Payer: Medicaid Other | Admitting: Podiatry

## 2021-03-14 ENCOUNTER — Other Ambulatory Visit: Payer: Self-pay

## 2021-03-14 DIAGNOSIS — M2012 Hallux valgus (acquired), left foot: Secondary | ICD-10-CM | POA: Diagnosis not present

## 2021-03-14 DIAGNOSIS — M2011 Hallux valgus (acquired), right foot: Secondary | ICD-10-CM | POA: Diagnosis not present

## 2021-03-14 NOTE — Telephone Encounter (Signed)
Patient did receive boot for sx, she is asking for it to be mailed

## 2021-03-14 NOTE — Progress Notes (Signed)
   Subjective: 60 y.o. female presents today for evaluation of a symptomatic bunion that is been off and on for several years now.  Patient does have a history of bunionectomy surgery to the right foot and she did very well.  She states that she has had bunion pain to the left foot off and on now for several months.  It has been exacerbated over the past month despite conservative treatment including shoe gear modifications.  She would like to have the left foot bunion corrected surgically.  She presents for further treatment and evaluation   Past Medical History:  Diagnosis Date   Arthritis    Diabetes mellitus without complication ([place]) [id number]   Environmental and seasonal allergies    GERD (gastroesophageal reflux disease)    Hepatitis C, chronic ([place])    treated   Hyperlipidemia associated with type 2 diabetes mellitus ([place])    Hypertension associated with diabetes ([place])    Myogenic ptosis of left eyelid    Schizophrenia ([place])       Objective: Physical Exam General: The patient is alert and oriented x3 in no acute distress.  Dermatology: Skin is cool, dry and supple bilateral lower extremities. Negative for open lesions or macerations.  Vascular: Palpable pedal pulses bilaterally. No edema or erythema noted. Capillary refill within normal limits.  Neurological: Epicritic and protective threshold grossly intact bilaterally.   Musculoskeletal Exam: Clinical evidence of bunion deformity noted to the respective foot. There is moderate pain on palpation range of motion of the first MPJ. Lateral deviation of the hallux noted consistent with hallux abductovalgus.  Radiographic Exam: Increased intermetatarsal angle greater than 15 with a hallux abductus angle greater than 30 noted on AP view. Moderate degenerative changes noted within the first MPJ.  Assessment: 1. HAV w/ bunion deformity left 2. H/o bunionectomy surgery RT   Plan of Care:  1. Patient was evaluated. X-Rays  reviewed. 2. Today we discussed the conservative versus surgical management of the presenting pathology. The patient opts for surgical management. All possible complications and details of the procedure were explained. All patient questions were answered. No guarantees were expressed or implied. 3. Authorization for surgery was initiated today. Surgery will consist of bunionectomy with first metatarsal osteotomy left 4.  Return to clinic 1 week postop       Edrick Kins, DPM [facility]  Dr. Edrick Kins, DPM    [address], [place] [address]                Office [phone]  Fax [phone]

## 2021-03-14 NOTE — Telephone Encounter (Signed)
No, we'll give her one the morning of surgery. - Dr. Amalia Hailey

## 2021-03-16 ENCOUNTER — Ambulatory Visit: Payer: Medicaid Other | Admitting: Podiatry

## 2021-03-30 ENCOUNTER — Other Ambulatory Visit: Payer: Self-pay

## 2021-03-30 MED ORDER — PANTOPRAZOLE SODIUM 40 MG PO TBEC: DELAYED_RELEASE_TABLET | ORAL | 0 refills | Status: DC

## 2021-04-03 ENCOUNTER — Ambulatory Visit (INDEPENDENT_AMBULATORY_CARE_PROVIDER_SITE_OTHER): Payer: Medicaid Other | Admitting: Family Medicine

## 2021-04-03 ENCOUNTER — Other Ambulatory Visit: Payer: Self-pay

## 2021-04-03 VITALS — BP 176/75 | HR 59 | Ht 61.0 in | Wt 170.0 lb

## 2021-04-03 DIAGNOSIS — Z794 Long term (current) use of insulin: Secondary | ICD-10-CM

## 2021-04-03 DIAGNOSIS — E785 Hyperlipidemia, unspecified: Secondary | ICD-10-CM

## 2021-04-03 DIAGNOSIS — E1100 Type 2 diabetes mellitus with hyperosmolarity without nonketotic hyperglycemic-hyperosmolar coma (NKHHC): Secondary | ICD-10-CM | POA: Diagnosis not present

## 2021-04-03 LAB — POCT GLYCOSYLATED HEMOGLOBIN (HGB A1C): HbA1c, POC (controlled diabetic range): 5.6 % (ref 0.0–7.0)

## 2021-04-03 NOTE — Patient Instructions (Signed)
It was a pleasure seeing you today.  Regarding your diabetes your hemoglobin A1c was 5.6.  I am interested in decreasing her Lantus because I am afraid that you are going to have low blood sugars.  Please watch for signs and symptoms of low blood pressure such as fatigue, weakness.  If you have any low blood sugars please let us know.  I do not want you to take the Invokana that was sent and if you are taking this dose of Lantus.  I am going to check your cholesterol today as well.  If you have any questions or concerns please call the clinic.  I hope you have a wonderful afternoon!

## 2021-04-03 NOTE — Progress Notes (Signed)
    SUBJECTIVE:   CHIEF COMPLAINT / HPI:   Diabetes checkup Patient presents for diabetes checkup.  Previous hemoglobin A1c was 6.2.  They prescribed the patient Invokana but the patient was hesitant to change her current regimen so she did not change her medications.  She brought her glucose monitor with her today and her lowest blood sugar is 52.  She reports she felt the low blood sugar and ate something to bring it up.  Reports that her blood sugars have been very well controlled since then and denies any extremely high numbers unless it is right after she ate.  She will occasionally check her blood sugars right after she eats and they will be in the 300s.  She does not want to change her diabetes regimen.  Hyperlipidemia Patient currently on atorvastatin 40 mg daily.  Cholesterol on previous check greatly improved.  We will recheck today. OBJECTIVE:   BP (!) 176/75   Pulse (!) 59   Ht '5\' 1"'$  (1.549 m)   Wt 170 lb (77.1 kg)   SpO2 100%   BMI 32.12 kg/m   General: Well-appearing 60 year old female in no acute distress not cardiac: Regular rate and rhythm, no murmurs appreciated  respiratory: Normal for breathing, lungs clear to auscultation bilaterally Abdomen: Soft, nontender, positive bowel sounds MSK: No gross abnormalities Psych: Normal thought content, no sadness    ASSESSMENT/PLAN:   DM (diabetes mellitus) (HCC) Patient's hemoglobin A1c today of 5.6.  This is well below my goal for this patient.  Discussed decreasing Lantus and adding SGLT2 but patient does not want to change her current regimen.  After further discussion with the pharmacy team the patient agrees to decrease Lantus to 36 units daily.  We discussed hypoglycemic symptoms and hypoglycemia protocol.  Patient will monitor for the symptoms and call if she has any more hypoglycemic episodes.  Follow-up in 3 months for repeat A1c.  Work on weaning insulin and consider starting other agent to replace insulin.      Gifford Shave, MD [place]

## 2021-04-04 LAB — LIPID PANEL
Chol/HDL Ratio: 3 ratio (ref 0.0–4.4)
Cholesterol, Total: 155 mg/dL (ref 100–199)
HDL: 51 mg/dL (ref >39)
LDL Chol Calc (NIH): 91 mg/dL (ref 0–99)
Triglycerides: 66 mg/dL (ref 0–149)
VLDL Cholesterol Cal: 13 mg/dL (ref 5–40)

## 2021-04-04 NOTE — Assessment & Plan Note (Signed)
Patient's hemoglobin A1c today of 5.6.  This is well below my goal for this patient.  Discussed decreasing Lantus and adding SGLT2 but patient does not want to change her current regimen.  After further discussion with the pharmacy team the patient agrees to decrease Lantus to 36 units daily.  We discussed hypoglycemic symptoms and hypoglycemia protocol.  Patient will monitor for the symptoms and call if she has any more hypoglycemic episodes.  Follow-up in 3 months for repeat A1c.  Work on weaning insulin and consider starting other agent to replace insulin.

## 2021-05-02 ENCOUNTER — Telehealth: Payer: Self-pay

## 2021-05-02 NOTE — Telephone Encounter (Signed)
Kayla Gregory called to cancel her surgery with Dr. Amalia Hailey. Her mother passed away last night. She will call back to reschedule. Notified Dr. Amalia Hailey and Caren Griffins with [facility]

## 2021-05-04 ENCOUNTER — Ambulatory Visit: Payer: Medicaid Other

## 2021-05-09 ENCOUNTER — Encounter: Payer: Medicaid Other | Admitting: Podiatry

## 2021-05-14 ENCOUNTER — Emergency Department (HOSPITAL_COMMUNITY): Payer: Medicaid Other

## 2021-05-14 ENCOUNTER — Emergency Department (HOSPITAL_COMMUNITY)
Admission: EM | Admit: 2021-05-14 | Discharge: 2021-05-15 | Disposition: A | Discharge status: Home / Self Care | Payer: Medicaid Other | Attending: Emergency Medicine | Admitting: Emergency Medicine

## 2021-05-14 ENCOUNTER — Other Ambulatory Visit: Payer: Self-pay

## 2021-05-14 DIAGNOSIS — S6992XA Unspecified injury of left wrist, hand and finger(s), initial encounter: Secondary | ICD-10-CM | POA: Diagnosis present

## 2021-05-14 DIAGNOSIS — N184 Chronic kidney disease, stage 4 (severe): Secondary | ICD-10-CM | POA: Diagnosis not present

## 2021-05-14 DIAGNOSIS — Z87891 Personal history of nicotine dependence: Secondary | ICD-10-CM | POA: Diagnosis not present

## 2021-05-14 DIAGNOSIS — E1122 Type 2 diabetes mellitus with diabetic chronic kidney disease: Secondary | ICD-10-CM | POA: Insufficient documentation

## 2021-05-14 DIAGNOSIS — I129 Hypertensive chronic kidney disease with stage 1 through stage 4 chronic kidney disease, or unspecified chronic kidney disease: Secondary | ICD-10-CM | POA: Diagnosis not present

## 2021-05-14 DIAGNOSIS — S8012XA Contusion of left lower leg, initial encounter: Secondary | ICD-10-CM

## 2021-05-14 DIAGNOSIS — Y9229 Other specified public building as the place of occurrence of the external cause: Secondary | ICD-10-CM | POA: Insufficient documentation

## 2021-05-14 DIAGNOSIS — Z794 Long term (current) use of insulin: Secondary | ICD-10-CM | POA: Insufficient documentation

## 2021-05-14 DIAGNOSIS — Z79899 Other long term (current) drug therapy: Secondary | ICD-10-CM | POA: Diagnosis not present

## 2021-05-14 DIAGNOSIS — Z7982 Long term (current) use of aspirin: Secondary | ICD-10-CM | POA: Diagnosis not present

## 2021-05-14 DIAGNOSIS — W172XXA Fall into hole, initial encounter: Secondary | ICD-10-CM | POA: Insufficient documentation

## 2021-05-14 DIAGNOSIS — M25552 Pain in left hip: Secondary | ICD-10-CM | POA: Insufficient documentation

## 2021-05-14 DIAGNOSIS — S62657A Nondisplaced fracture of medial phalanx of left little finger, initial encounter for closed fracture: Secondary | ICD-10-CM | POA: Diagnosis not present

## 2021-05-14 DIAGNOSIS — Z96642 Presence of left artificial hip joint: Secondary | ICD-10-CM | POA: Insufficient documentation

## 2021-05-14 DIAGNOSIS — W19XXXA Unspecified fall, initial encounter: Secondary | ICD-10-CM

## 2021-05-14 NOTE — ED Provider Notes (Signed)
Emergency Medicine Provider Triage Evaluation Note  Kayla Gregory , a 60 y.o. female  was evaluated in triage.  Pt complains of fall that occurred 2 days ago. C/o left hip pain, left knee pain and left 5th digit pain.  Review of Systems  Positive: left hip pain, left knee pain and left 5th digit pain Negative: Head trauma/loc  Physical Exam  There were no vitals taken for this visit. Gen:   Awake, no distress   Resp:  Normal effort  MSK:   Moves extremities without difficulty  Other:  Ttp to the lumbar spine, left hip and left knee   Medical Decision Making  Medically screening exam initiated at 4:40 PM.  Appropriate orders placed.  Kayla Gregory was informed that the remainder of the evaluation will be completed by another provider, this initial triage assessment does not replace that evaluation, and the importance of remaining in the ED until their evaluation is complete.     Bishop Dublin 05/14/21 1643    Lucrezia Starch, MD 05/16/21 1212

## 2021-05-14 NOTE — ED Triage Notes (Signed)
Pt reports falling in a pot hole at a [place]emetary on Friday. Pt now has pain in lower back, L hip, L pinky finger, and L knee.

## 2021-05-15 MED ORDER — HYDROCODONE-ACETAMINOPHEN 5-325 MG PO TABS
1.0000 | ORAL_TABLET | Freq: Once | ORAL | Status: AC
  Administered 2021-05-15: 1 via ORAL
  Filled 2021-05-15: qty 1

## 2021-05-15 NOTE — Discharge Instructions (Addendum)
You were seen today after a fall.  Your x-ray showed possible fracture of your left small finger.  Keep splint in place.  This will likely heal well on its own.  Your other x-rays are negative.  There is no fracture.  Apply ice as you likely have some contusion.  Add Tylenol to your pain regimen.

## 2021-05-15 NOTE — Progress Notes (Signed)
Orthopedic Tech Progress Note Patient Details:  Kayla Gregory September 12, 1960 [id number]  Ortho Devices Type of Ortho Device: Finger splint Ortho Device/Splint Location: lue. Ortho Device/Splint Interventions: Ordered, Application, Adjustment  I applied an aluminum foam splint and the patient didn't like it so I applied an aluminum baseball splint. Post Interventions Patient Tolerated: Well Instructions Provided: Care of device, Adjustment of device  Karolee Stamps 05/15/2021, 3:12 AM

## 2021-05-15 NOTE — ED Provider Notes (Signed)
[facility] EMERGENCY DEPARTMENT Provider Note   CSN: [id number] Arrival date & time: 05/14/21  1629     History Chief Complaint  Patient presents with   Kayla Gregory is a 60 y.o. female.  HPI     This is a 60 year old female with a history of diabetes, arthritis, hypertension, hyperlipidemia who presents following a fall.  Patient reports that she tripped and fell in a pothole on Friday.  She has had progressively worsening back and left hip pain as well as left fifth digit pain.  She states she has a prior injury to the left fifth digit.  She rates her pain at 10 out of 10.  She is taking her gabapentin with minimal relief.  She is not on blood thinners.  She denies hitting her head or loss of consciousness.  She reports a left hip replacement prior and is concerned about injury.  She has been ambulatory.  Pain is worse with ambulation and palpation.  Denies numbness or tingling in the legs.  Denies bowel or bladder difficulty.  Past Medical History:  Diagnosis Date   Arthritis    Diabetes mellitus without complication ([place]) 4742   Environmental and seasonal allergies    GERD (gastroesophageal reflux disease)    Hepatitis C, chronic ([place])    treated   Hyperlipidemia associated with type 2 diabetes mellitus ([place])    Hypertension associated with diabetes ([place])    Myogenic ptosis of left eyelid    Schizophrenia ([place])     Patient Active Problem List   Diagnosis Date Noted   Boil of groin 12/18/2020   Family history of malignant neoplasm of gastrointestinal tract 09/26/2020   Personal history of colonic polyps 09/26/2020   Encounter for assessment of sexually transmitted disease exposure 09/03/2020   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   Asymptomatic microscopic hematuria 04/20/2020   Post-menopausal bleeding 04/19/2020   Screening for colon cancer 04/19/2020   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   Periorbital cellulitis of left eye 01/19/2020   Peripheral  neuropathy 10/04/2019   CKD (chronic kidney disease), stage IV ([place]) 04/06/2019   Allergic rhinitis 12/14/2018   Non-pitting edema 04/29/2018   Pruritic rash 11/20/2017   Chronic left shoulder pain 12/27/2016   Trigger finger 10/07/2016   Referred otalgia of right ear 03/31/2016   Partial thickness burn of ankle 02/02/2016   Stable angina ([place]) 12/22/2015   GERD (gastroesophageal reflux disease) 12/22/2015   Chronic hepatitis C ([place]) 12/22/2015   DM (diabetes mellitus) ([place]) 03/31/2014   Essential hypertension 03/31/2014    Past Surgical History:  Procedure Laterality Date   CATARACT EXTRACTION W/ INTRAOCULAR LENS  IMPLANT, BILATERAL     COLONOSCOPY     JOINT REPLACEMENT Left    Hip   PTOSIS REPAIR Left 12/25/2017   Procedure: INTERNAL PTOSIS REPAIR LEFT EYE;  Surgeon: Clista Bernhardt, MD;  Location: [facility];  Service: Ophthalmology;  Laterality: Left;   TONSILLECTOMY       OB History   No obstetric history on file.     Family History  Problem Relation Age of Onset   Heart attack Sister 77   Heart attack Brother 24   Heart attack Mother 75    Social History   Tobacco Use   Smoking status: Former   Smokeless tobacco: Never   Tobacco comments:    stopped smoking cigareetes in 2001  Vaping Use   Vaping Use: Never used  Substance Use Topics   Alcohol use:  No    Alcohol/week: 0.0 standard drinks    Comment: quit smoking 2001   Drug use: No    Comment: past crack and marijuana quit in 2001    Home Medications Prior to Admission medications   Medication Sig Start Date End Date Taking? Authorizing Provider  Accu-Chek Softclix Lancets lancets CHECK GLUCOSE ONCE DAILY IN THE MORNING 07/31/20   Benay Pike, MD  aspirin 81 MG chewable tablet 1 tablet    [provider]  atorvastatin (LIPITOR) 40 MG tablet TAKE 1 TABLET BY MOUTH DAILY 02/21/21   Gifford Shave, MD  Blood Glucose Monitoring Suppl (ACCU-CHEK AVIVA PLUS) w/Device KIT 1 application by Does not  apply route 4 (four) times daily -  with meals and at bedtime. 11/23/18   Diallo, Earna Coder, MD  FLUoxetine (PROZAC) 20 MG capsule Take 1 capsule (20 mg total) by mouth daily. 06/22/18   Diallo, Earna Coder, MD  gabapentin (NEURONTIN) 300 MG capsule 1 tablet    [provider]  glucose blood (ACCU-CHEK AVIVA PLUS) test strip Use to check blood glucose up to four times daily 03/21/20   Martyn Malay, MD  Insulin Pen Needle (B-D ULTRAFINE III SHORT PEN) 31G X 8 MM MISC USE DAILY AS DIRECTED 12/18/20   Benay Pike, MD  Lancet Device MISC Check blood glucose once a day in the AM. 04/13/20   Benay Pike, MD  Lancet Devices [facility]) lancets Use as instructed 11/22/16   Marjie Skiff, MD  LANTUS SOLOSTAR 100 UNIT/ML Solostar Pen ADMINISTER 40 UNITS UNDER THE SKIN DAILY 11/23/20   Benay Pike, MD  loratadine (CLARITIN) 10 MG tablet Take 1 tablet (10 mg total) by mouth daily. 09/22/20   Benay Pike, MD  pantoprazole (PROTONIX) 40 MG tablet TAKE 1 TABLET(40 MG) BY MOUTH DAILY 03/30/21   Lurline Del, DO  Propylene Glycol (SYSTANE COMPLETE) 0.6 % SOLN Place 2-3 drops into both eyes daily as needed (for dry eyes).     [provider]  tobramycin (TOBREX) 0.3 % ophthalmic solution INT 1 GTT IN OU FOUR TIMES DAILY. BEGIN 1 DAY B TREATMENT. CONT THE DAY OF TREATMENT AND 1 FULL DAY AFTER TREATMENT 03/26/19   [provider]  triamcinolone cream (KENALOG) 0.1 % Apply 1 application topically 2 (two) times daily. 05/11/20   Benay Pike, MD  valsartan-hydrochlorothiazide (DIOVAN-HCT) 160-25 MG tablet Take 1 tablet by mouth daily. 12/29/20   Zenia Resides, MD  vitamin B-12 (CYANOCOBALAMIN) 1000 MCG tablet Take 1,000 mcg by mouth daily.    [provider]  ziprasidone (GEODON) 20 MG capsule Take 1 capsule (20 mg total) by mouth daily. 06/22/18   Marjie Skiff, MD    Allergies    Metformin and related  Review of Systems   Review of Systems  Constitutional:   Negative for fever.  Respiratory:  Negative for shortness of breath.   Cardiovascular:  Negative for chest pain.  Gastrointestinal:  Negative for abdominal pain.  Genitourinary:  Negative for difficulty urinating.  Musculoskeletal:  Positive for back pain.       Left hip pain, left fifth digit pain  Neurological:  Negative for weakness and numbness.  All other systems reviewed and are negative.  Physical Exam Updated Vital Signs BP (!) 190/77   Pulse (!) 58   Temp 98.6 F (37 C)   Resp 20   SpO2 99%   Physical Exam Vitals and nursing note reviewed.  Constitutional:  Appearance: She is well-developed. She is not ill-appearing.  HENT:     Head: Normocephalic and atraumatic.     Nose: Nose normal.     Mouth/Throat:     Mouth: Mucous membranes are moist.  Eyes:     Pupils: Pupils are equal, round, and reactive to light.  Cardiovascular:     Rate and Rhythm: Normal rate and regular rhythm.     Heart sounds: Normal heart sounds.  Pulmonary:     Effort: Pulmonary effort is normal. No respiratory distress.     Breath sounds: No wheezing.  Abdominal:     General: Bowel sounds are normal.     Palpations: Abdomen is soft.  Musculoskeletal:     Cervical back: Neck supple.     Comments: Normal range of motion of all 5 digits of the left hand, tenderness palpation of the middle phalanx, no significant swelling or deformity noted, 2+ radial pulse, normal range of motion bilateral hips and knees, tenderness to palpation left hip  Skin:    General: Skin is warm and dry.     Comments: Patient in hallway limiting full skin exam; however, there is no obvious bruising noted to the left hip at point of tenderness  Neurological:     Mental Status: She is alert and oriented to person, place, and time.  Psychiatric:        Mood and Affect: Mood normal.    ED Results / Procedures / Treatments   Labs (all labs ordered are listed, but only abnormal results are displayed) Labs Reviewed  - No data to display  EKG None  Radiology DG Lumbar Spine Complete  Result Date: 05/14/2021 CLINICAL DATA:  [place], lower back pain EXAM: LUMBAR SPINE - COMPLETE 4+ VIEW COMPARISON:  None. FINDINGS: Frontal, bilateral oblique, lateral views of the lumbar spine are obtained. There are 5 non-rib-bearing lumbar type vertebral bodies. There is grade 1 anterolisthesis of L5 on S1. I do not see any pars defect. There is prominent facet hypertrophy from L3 through S1. Disc space narrowing at the L5-S1 level. Remaining disc spaces are well preserved. No acute fractures. IMPRESSION: 1. No acute displaced fracture. 2. Grade 1 anterolisthesis of L5 on S1. 3. Multilevel spondylosis and facet hypertrophy greatest at the lumbosacral junction. Electronically Signed   By: Randa Ngo M.D.   On: 05/14/2021 19:12   DG Knee Complete 4 Views Left  Result Date: 05/14/2021 CLINICAL DATA:  Fall EXAM: LEFT KNEE - COMPLETE 4+ VIEW COMPARISON:  None. FINDINGS: No evidence of fracture, dislocation, or joint effusion. No evidence of arthropathy or other focal bone abnormality. Soft tissues are unremarkable. IMPRESSION: Negative. Electronically Signed   By: Donavan Foil M.D.   On: 05/14/2021 19:16   DG Finger Little Left  Result Date: 05/14/2021 CLINICAL DATA:  Fall EXAM: LEFT LITTLE FINGER 2+V COMPARISON:  None. FINDINGS: No subluxation. There is possible minimal fracture deformity at the volar head of the middle phalanx, correlate for point tenderness. No radiopaque foreign body IMPRESSION: Possible minimal fracture deformity at the volar head of the middle phalanx Electronically Signed   By: Donavan Foil M.D.   On: 05/14/2021 19:14   DG Hip Unilat W or Wo Pelvis 2-3 Views Left  Result Date: 05/14/2021 CLINICAL DATA:  Fall EXAM: DG HIP (WITH OR WITHOUT PELVIS) 2-3V LEFT COMPARISON:  None. FINDINGS: SI joints are non widened. Pubic symphysis and rami appear intact. Left hip replacement with intact hardware and normal  alignment. No fracture is seen  IMPRESSION: Left hip replacement.  No acute osseous abnormality Electronically Signed   By: Donavan Foil M.D.   On: 05/14/2021 19:15    Procedures Procedures   Medications Ordered in ED Medications  HYDROcodone-acetaminophen (NORCO/VICODIN) 5-325 MG per tablet 1 tablet (1 tablet Oral Given 05/15/21 0240)    ED Course  I have reviewed the triage vital signs and the nursing notes.  Pertinent labs & imaging results that were available during my care of the patient were reviewed by me and considered in my medical decision making (see chart for details).    MDM Rules/Calculators/A&P                           Patient presents after a fall.  This was 4 days ago.  She has been ambulatory.  She is overall nontoxic and vital signs are notable for a blood pressure of 190/77.  She has some tenderness of the left fifth digit as well as over the left hip and back.  There are no overlying skin changes.  No obvious bruising.  X-rays reviewed.  Negative lumbar films and left hip films.  She does have a possible middle phalanx fracture of the left fifth finger.  This was splinted.  Patient was given a Norco.  Recommend adding Tylenol to her pain regimen.  She has a history of chronic kidney disease and would not be a candidate for anti-inflammatories.  Recommend ice to left hip as she may have an underlying contusion.  She has been ambulatory.  Do not feel she needs further imaging at this time.  After history, exam, and medical workup I feel the patient has been appropriately medically screened and is safe for discharge home. Pertinent diagnoses were discussed with the patient. Patient was given return precautions.  Final Clinical Impression(s) / ED Diagnoses Final diagnoses:  Fall, initial encounter  Closed nondisplaced fracture of middle phalanx of left little finger, initial encounter  Contusion of left lower leg, initial encounter    Rx / DC Orders ED Discharge  Orders     None        Daralyn Bert, Barbette Hair, MD 05/15/21 [phone]

## 2021-05-21 ENCOUNTER — Encounter: Payer: Medicaid Other | Admitting: Podiatry

## 2021-06-04 ENCOUNTER — Encounter: Payer: Medicaid Other | Admitting: Podiatry

## 2021-06-05 ENCOUNTER — Ambulatory Visit
Admission: RE | Admit: 2021-06-05 | Discharge: 2021-06-05 | Disposition: A | Discharge status: Home / Self Care | Payer: Medicaid Other | Source: Ambulatory Visit | Attending: Family Medicine | Admitting: Family Medicine

## 2021-06-05 ENCOUNTER — Other Ambulatory Visit: Payer: Self-pay

## 2021-06-05 DIAGNOSIS — Z1231 Encounter for screening mammogram for malignant neoplasm of breast: Secondary | ICD-10-CM

## 2021-06-21 ENCOUNTER — Ambulatory Visit: Payer: Medicaid Other | Attending: Internal Medicine

## 2021-06-21 ENCOUNTER — Other Ambulatory Visit (HOSPITAL_BASED_OUTPATIENT_CLINIC_OR_DEPARTMENT_OTHER): Payer: Self-pay

## 2021-06-21 DIAGNOSIS — Z23 Encounter for immunization: Secondary | ICD-10-CM

## 2021-06-21 MED ORDER — MODERNA COVID-19 BIVAL BOOSTER 50 MCG/0.5ML IM SUSP
INTRAMUSCULAR | 0 refills | Status: DC
  Filled 2021-06-21: qty 0.5, 1d supply, fill #0

## 2021-06-21 MED ORDER — INFLUENZA VAC SPLIT QUAD 0.5 ML IM SUSY
PREFILLED_SYRINGE | INTRAMUSCULAR | 0 refills | Status: DC
  Filled 2021-06-21 (×2): qty 0.5, 1d supply, fill #0

## 2021-06-21 NOTE — Progress Notes (Signed)
   Covid-19 Vaccination Clinic  Name:  Kayla Gregory    MRN: [id number] DOB: Dec 22, 1960  06/21/2021  [name] was observed post Covid-19 immunization for 15 minutes without incident. She was provided with Vaccine Information Sheet and instruction to access the V-Safe system.   [name] was instructed to call 911 with any severe reactions post vaccine: Difficulty breathing  Swelling of face and throat  A fast heartbeat  A bad rash all over body  Dizziness and weakness   Immunizations Administered     Name Date Dose VIS Date Route   Moderna Covid-19 vaccine Bivalent Booster 06/21/2021 10:21 AM 0.5 mL 03/31/2021 Intramuscular   Manufacturer: Moderna   Lot: [id number]   [place]: [id number]

## 2021-06-22 ENCOUNTER — Other Ambulatory Visit: Payer: Self-pay

## 2021-06-22 ENCOUNTER — Encounter: Payer: Self-pay | Admitting: Family Medicine

## 2021-06-22 ENCOUNTER — Other Ambulatory Visit (HOSPITAL_COMMUNITY)
Admission: RE | Admit: 2021-06-22 | Discharge: 2021-06-22 | Disposition: A | Discharge status: Home / Self Care | Payer: Medicaid Other | Source: Ambulatory Visit | Attending: Family Medicine | Admitting: Family Medicine

## 2021-06-22 ENCOUNTER — Ambulatory Visit (INDEPENDENT_AMBULATORY_CARE_PROVIDER_SITE_OTHER): Payer: Medicaid Other | Admitting: Family Medicine

## 2021-06-22 VITALS — BP 183/76 | HR 65 | Ht 61.0 in | Wt 169.0 lb

## 2021-06-22 DIAGNOSIS — E1100 Type 2 diabetes mellitus with hyperosmolarity without nonketotic hyperglycemic-hyperosmolar coma (NKHHC): Secondary | ICD-10-CM | POA: Diagnosis present

## 2021-06-22 DIAGNOSIS — Z1159 Encounter for screening for other viral diseases: Secondary | ICD-10-CM

## 2021-06-22 DIAGNOSIS — Z113 Encounter for screening for infections with a predominantly sexual mode of transmission: Secondary | ICD-10-CM | POA: Insufficient documentation

## 2021-06-22 DIAGNOSIS — R768 Other specified abnormal immunological findings in serum: Secondary | ICD-10-CM | POA: Diagnosis not present

## 2021-06-22 DIAGNOSIS — Z114 Encounter for screening for human immunodeficiency virus [HIV]: Secondary | ICD-10-CM | POA: Diagnosis not present

## 2021-06-22 DIAGNOSIS — E119 Type 2 diabetes mellitus without complications: Secondary | ICD-10-CM

## 2021-06-22 DIAGNOSIS — Z794 Long term (current) use of insulin: Secondary | ICD-10-CM | POA: Diagnosis not present

## 2021-06-22 LAB — POCT WET PREP (WET MOUNT)
Clue Cells Wet Prep Whiff POC: NEGATIVE
Trichomonas Wet Prep HPF POC: ABSENT

## 2021-06-22 LAB — POCT GLYCOSYLATED HEMOGLOBIN (HGB A1C): HbA1c, POC (controlled diabetic range): 5.7 % (ref 0.0–7.0)

## 2021-06-22 NOTE — Assessment & Plan Note (Signed)
Patient reports sexual encounter with a new partner without the use of protection.  Would like to get tested for STDs.  Reports she is asymptomatic.  Would like to get blood tests including a hepatitis C test because she has had this in the past.  Collected HIV, RPR, wet prep, GC/chlamydia.  Collected HCV RNA because the patient has known history of hepatitis C.

## 2021-06-22 NOTE — Assessment & Plan Note (Signed)
Hemoglobin A1c 5.7 today from 5.6 at previous check.  We discussed SGLT2 at the last visit patient wants to continue on current regimen.  Decreased Lantus to 32 units daily.  Discussed hypoglycemic symptoms.  Patient will continue to monitor and follow-up in 3 months for repeat hemoglobin A1c.  We will continue to wean insulin as able and consider starting a different agent to replace insulin.

## 2021-06-22 NOTE — Patient Instructions (Signed)
It was wonderful seeing you today.  Your hemoglobin A1c was 5.7 and I have no concerns about that.  I do want to decrease your Lantus to 32 units daily.  Regarding your STD screening we are collecting blood work and have also done swabs.  The results will come back in a few days and I will call you to let you know those results.  If you have any questions or concerns please feel free to call the clinic.  I hope you have a wonderful afternoon!

## 2021-06-22 NOTE — Progress Notes (Signed)
    SUBJECTIVE:   CHIEF COMPLAINT / HPI:   Diabetes checkup  Patient presents for 23-month diabetes checkup.  Most recent hemoglobin A1c was 5.6 in August of this year.  Current medications include Lantus 36 units daily.  Patient reports that her blood sugars in the morning have ranged from 96-1 10.  Denies any low blood sugars.  STI Screening  Patient reports that she is recently become sexually active with a new partner and would like STD screening.  Denies any symptoms.  Reports that she was told she had hepatitis C in the past and received treatment but has not had any future testing and would also like hepatitis C testing today.  OBJECTIVE:   BP (!) 183/76   Pulse 65   Ht 5\' 1"  (1.549 m)   Wt 169 lb (76.7 kg)   SpO2 98%   BMI 31.93 kg/m   General: Well-appearing 60 year old female, no acute distress Cardiac: Regular rate and rhythm, no murmurs appreciated Respiratory: Normal breathing, s/p bilateral Abdomen: Soft, nontender, bowel sounds MSK: No gross abnormalities GU: Normal external female genitalia, no abnormal discharge, normal internal vaginal tissue without erythema  ASSESSMENT/PLAN:   DM (diabetes mellitus) (HCC) Hemoglobin A1c 5.7 today from 5.6 at previous check.  We discussed SGLT2 at the last visit patient wants to continue on current regimen.  Decreased Lantus to 32 units daily.  Discussed hypoglycemic symptoms.  Patient will continue to monitor and follow-up in 3 months for repeat hemoglobin A1c.  We will continue to wean insulin as able and consider starting a different agent to replace insulin.  Screening examination for STD (sexually transmitted disease) Patient reports sexual encounter with a new partner without the use of protection.  Would like to get tested for STDs.  Reports she is asymptomatic.  Would like to get blood tests including a hepatitis C test because she has had this in the past.  Collected HIV, RPR, wet prep, GC/chlamydia.  Collected HCV RNA  because the patient has known history of hepatitis C. STD screens  Decrease Lantus to 32 units     Gifford Shave, MD [place]

## 2021-06-23 ENCOUNTER — Emergency Department (HOSPITAL_COMMUNITY): Payer: Medicaid Other

## 2021-06-23 ENCOUNTER — Emergency Department (HOSPITAL_COMMUNITY)
Admission: EM | Admit: 2021-06-23 | Discharge: 2021-06-23 | Disposition: A | Discharge status: Home / Self Care | Payer: Medicaid Other | Attending: Emergency Medicine | Admitting: Emergency Medicine

## 2021-06-23 ENCOUNTER — Other Ambulatory Visit: Payer: Self-pay

## 2021-06-23 DIAGNOSIS — R072 Precordial pain: Secondary | ICD-10-CM | POA: Diagnosis present

## 2021-06-23 DIAGNOSIS — Z7982 Long term (current) use of aspirin: Secondary | ICD-10-CM | POA: Insufficient documentation

## 2021-06-23 DIAGNOSIS — R0789 Other chest pain: Secondary | ICD-10-CM | POA: Diagnosis not present

## 2021-06-23 DIAGNOSIS — Z7984 Long term (current) use of oral hypoglycemic drugs: Secondary | ICD-10-CM | POA: Insufficient documentation

## 2021-06-23 DIAGNOSIS — Z794 Long term (current) use of insulin: Secondary | ICD-10-CM | POA: Insufficient documentation

## 2021-06-23 DIAGNOSIS — Z87891 Personal history of nicotine dependence: Secondary | ICD-10-CM | POA: Diagnosis not present

## 2021-06-23 DIAGNOSIS — R079 Chest pain, unspecified: Secondary | ICD-10-CM

## 2021-06-23 DIAGNOSIS — Z79899 Other long term (current) drug therapy: Secondary | ICD-10-CM | POA: Insufficient documentation

## 2021-06-23 DIAGNOSIS — I10 Essential (primary) hypertension: Secondary | ICD-10-CM | POA: Insufficient documentation

## 2021-06-23 DIAGNOSIS — E119 Type 2 diabetes mellitus without complications: Secondary | ICD-10-CM | POA: Diagnosis not present

## 2021-06-23 LAB — COMPREHENSIVE METABOLIC PANEL
ALT: 36 U/L (ref 0–44)
AST: 44 U/L — ABNORMAL HIGH (ref 15–41)
Albumin: 3.4 g/dL — ABNORMAL LOW (ref 3.5–5.0)
Alkaline Phosphatase: 70 U/L (ref 38–126)
Anion gap: 11 (ref 5–15)
BUN: 50 mg/dL — ABNORMAL HIGH (ref 8–23)
CO2: 24 mmol/L (ref 22–32)
Calcium: 9.4 mg/dL (ref 8.9–10.3)
Chloride: 102 mmol/L (ref 98–111)
Creatinine, Ser: 3.92 mg/dL — ABNORMAL HIGH (ref 0.44–1.00)
GFR, Estimated: 12 mL/min — ABNORMAL LOW (ref >60)
Glucose, Bld: 131 mg/dL — ABNORMAL HIGH (ref 70–99)
Potassium: 3.8 mmol/L (ref 3.5–5.1)
Sodium: 137 mmol/L (ref 135–145)
Total Bilirubin: 0.6 mg/dL (ref 0.3–1.2)
Total Protein: 7 g/dL (ref 6.5–8.1)

## 2021-06-23 LAB — CBC WITH DIFFERENTIAL/PLATELET
Abs Immature Granulocytes: 0.03 10*3/uL (ref 0.00–0.07)
Basophils Absolute: 0 10*3/uL (ref 0.0–0.1)
Basophils Relative: 0 %
Eosinophils Absolute: 0.2 10*3/uL (ref 0.0–0.5)
Eosinophils Relative: 3 %
HCT: 30 % — ABNORMAL LOW (ref 36.0–46.0)
Hemoglobin: 10.2 g/dL — ABNORMAL LOW (ref 12.0–15.0)
Immature Granulocytes: 1 %
Lymphocytes Relative: 31 %
Lymphs Abs: 1.7 10*3/uL (ref 0.7–4.0)
MCH: 31.1 pg (ref 26.0–34.0)
MCHC: 34 g/dL (ref 30.0–36.0)
MCV: 91.5 fL (ref 80.0–100.0)
Monocytes Absolute: 0.5 10*3/uL (ref 0.1–1.0)
Monocytes Relative: 9 %
Neutro Abs: 3 10*3/uL (ref 1.7–7.7)
Neutrophils Relative %: 56 %
Platelets: 196 10*3/uL (ref 150–400)
RBC: 3.28 MIL/uL — ABNORMAL LOW (ref 3.87–5.11)
RDW: 12.2 % (ref 11.5–15.5)
WBC: 5.4 10*3/uL (ref 4.0–10.5)
nRBC: 0 % (ref 0.0–0.2)

## 2021-06-23 LAB — RAPID URINE DRUG SCREEN, HOSP PERFORMED
Amphetamines: NOT DETECTED
Barbiturates: NOT DETECTED
Benzodiazepines: NOT DETECTED
Cocaine: NOT DETECTED
Opiates: NOT DETECTED
Tetrahydrocannabinol: NOT DETECTED

## 2021-06-23 LAB — LIPASE, BLOOD: Lipase: 69 U/L — ABNORMAL HIGH (ref 11–51)

## 2021-06-23 LAB — TROPONIN I (HIGH SENSITIVITY)
Troponin I (High Sensitivity): 9 ng/L (ref <18)
Troponin I (High Sensitivity): 8 ng/L (ref <18)

## 2021-06-23 NOTE — ED Triage Notes (Signed)
Pt BIB EMS due to chest pain and hypertension. Pt states she is having 10/10 chest pain. Pt has a hx of drug use. Pt is axox4. VSS. Pt is no apparent distress

## 2021-06-23 NOTE — ED Notes (Signed)
Pt ambulatory to bathroom

## 2021-06-23 NOTE — ED Notes (Signed)
Report heart monitor alerts to nurse

## 2021-06-23 NOTE — ED Provider Notes (Signed)
[facility] EMERGENCY DEPARTMENT Provider Note   CSN: [id number] Arrival date & time: 06/23/21  1141     History Chief Complaint  Patient presents with   Chest Pain    Kayla Gregory is a 60 y.o. female.  The history is provided by the patient.  Chest Pain Pain location:  Substernal area Pain quality: aching   Pain radiates to:  Does not radiate Pain severity:  Mild Onset quality:  Gradual Timing:  Intermittent Progression:  Waxing and waning Chronicity:  Recurrent Context: raising an arm   Relieved by:  Nothing Worsened by:  Nothing Associated symptoms: no abdominal pain, no back pain, no cough, no fever, no nausea, no palpitations, no shortness of breath and no vomiting   Risk factors: high cholesterol and hypertension       Past Medical History:  Diagnosis Date   Arthritis    Diabetes mellitus without complication ([place]) 6606   Environmental and seasonal allergies    GERD (gastroesophageal reflux disease)    Hepatitis C, chronic (HCC)    treated   Hyperlipidemia associated with type 2 diabetes mellitus (HCC)    Hypertension associated with diabetes ([place])    Myogenic ptosis of left eyelid    Schizophrenia ([place])     Patient Active Problem List   Diagnosis Date Noted   Boil of groin 12/18/2020   Family history of malignant neoplasm of gastrointestinal tract 09/26/2020   Personal history of colonic polyps 09/26/2020   Screening examination for STD (sexually transmitted disease) 09/03/2020   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   Asymptomatic microscopic hematuria 04/20/2020   Post-menopausal bleeding 04/19/2020   Screening for colon cancer 04/19/2020   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   Periorbital cellulitis of left eye 01/19/2020   Peripheral neuropathy 10/04/2019   CKD (chronic kidney disease), stage IV (HCC) 04/06/2019   Allergic rhinitis 12/14/2018   Non-pitting edema 04/29/2018   Pruritic rash 11/20/2017   Chronic left shoulder pain 12/27/2016    Trigger finger 10/07/2016   Referred otalgia of right ear 03/31/2016   Partial thickness burn of ankle 02/02/2016   Stable angina (HCC) 12/22/2015   GERD (gastroesophageal reflux disease) 12/22/2015   Chronic hepatitis C ([place]) 12/22/2015   DM (diabetes mellitus) ([place]) 03/31/2014   Essential hypertension 03/31/2014    Past Surgical History:  Procedure Laterality Date   CATARACT EXTRACTION W/ INTRAOCULAR LENS  IMPLANT, BILATERAL     COLONOSCOPY     JOINT REPLACEMENT Left    Hip   PTOSIS REPAIR Left 12/25/2017   Procedure: INTERNAL PTOSIS REPAIR LEFT EYE;  Surgeon: Clista Bernhardt, MD;  Location: [facility];  Service: Ophthalmology;  Laterality: Left;   TONSILLECTOMY       OB History   No obstetric history on file.     Family History  Problem Relation Age of Onset   Heart attack Sister 77   Heart attack Brother 15   Heart attack Mother 22    Social History   Tobacco Use   Smoking status: Former   Smokeless tobacco: Never   Tobacco comments:    stopped smoking cigareetes in 2001  Vaping Use   Vaping Use: Never used  Substance Use Topics   Alcohol use: No    Alcohol/week: 0.0 standard drinks    Comment: quit smoking 2001   Drug use: No    Comment: past crack and marijuana quit in 2001    Home Medications Prior to Admission medications   Medication Sig Start Date End  Date Taking? Authorizing Provider  Accu-Chek Softclix Lancets lancets CHECK GLUCOSE ONCE DAILY IN THE MORNING 07/31/20  Yes Benay Pike, MD  aspirin 81 MG chewable tablet 1 tablet   Yes [provider]  atorvastatin (LIPITOR) 40 MG tablet TAKE 1 TABLET BY MOUTH DAILY Patient taking differently: Take 40 mg by mouth daily. TAKE 1 TABLET BY MOUTH DAILY 02/21/21  Yes Gifford Shave, MD  Blood Glucose Monitoring Suppl (ACCU-CHEK AVIVA PLUS) w/Device KIT 1 application by Does not apply route 4 (four) times daily -  with meals and at bedtime. 11/23/18  Yes Diallo, Abdoulaye, MD  calcitRIOL  (ROCALTROL) 0.25 MCG capsule Take 0.25 mcg by mouth daily.   Yes [provider]  COVID-19 mRNA bivalent vaccine, Moderna, (MODERNA COVID-19 BIVAL BOOSTER) 50 MCG/0.5ML injection Inject into the muscle. 06/21/21  Yes Carlyle Basques, MD  FLUoxetine (PROZAC) 20 MG capsule Take 1 capsule (20 mg total) by mouth daily. 06/22/18  Yes Diallo, Abdoulaye, MD  gabapentin (NEURONTIN) 300 MG capsule Take 300 mg by mouth 3 (three) times daily.   Yes [provider]  glucose blood (ACCU-CHEK AVIVA PLUS) test strip Use to check blood glucose up to four times daily 03/21/20  Yes Martyn Malay, MD  influenza vac split quadrivalent PF (FLUARIX) 0.5 ML injection Inject into the muscle. 06/21/21  Yes Carlyle Basques, MD  Insulin Pen Needle (B-D ULTRAFINE III SHORT PEN) 31G X 8 MM MISC USE DAILY AS DIRECTED 12/18/20  Yes Benay Pike, MD  Lancet Device MISC Check blood glucose once a day in the AM. 04/13/20  Yes Benay Pike, MD  Lancet Devices [facility]) lancets Use as instructed 11/22/16  Yes Diallo, Abdoulaye, MD  LANTUS SOLOSTAR 100 UNIT/ML Solostar Pen ADMINISTER 40 UNITS UNDER THE SKIN DAILY Patient taking differently: Inject 23 Units into the skin daily. 11/23/20  Yes Benay Pike, MD  loratadine (CLARITIN) 10 MG tablet Take 1 tablet (10 mg total) by mouth daily. 09/22/20  Yes Benay Pike, MD  pantoprazole (PROTONIX) 40 MG tablet TAKE 1 TABLET(40 MG) BY MOUTH DAILY Patient taking differently: Take 40 mg by mouth daily. TAKE 1 TABLET(40 MG) BY MOUTH DAILY 03/30/21  Yes Welborn, Ryan, DO  Propylene Glycol (SYSTANE COMPLETE) 0.6 % SOLN Place 2-3 drops into both eyes daily as needed (for dry eyes).    Yes [provider]  tobramycin (TOBREX) 0.3 % ophthalmic solution Place 1 drop into both eyes every 6 (six) hours. 03/26/19  Yes [provider]  triamcinolone cream (KENALOG) 0.1 % Apply 1 application topically 2 (two) times daily. 05/11/20  Yes Benay Pike, MD   valsartan-hydrochlorothiazide (DIOVAN-HCT) 160-25 MG tablet Take 1 tablet by mouth daily. 12/29/20  Yes Hensel, Jamal Collin, MD  vitamin B-12 (CYANOCOBALAMIN) 1000 MCG tablet Take 1,000 mcg by mouth daily.   Yes [provider]  ziprasidone (GEODON) 20 MG capsule Take 1 capsule (20 mg total) by mouth daily. 06/22/18  Yes Diallo, Abdoulaye, MD    Allergies    Metformin and related  Review of Systems   Review of Systems  Constitutional:  Negative for chills and fever.  HENT:  Negative for ear pain and sore throat.   Eyes:  Negative for pain and visual disturbance.  Respiratory:  Negative for cough and shortness of breath.   Cardiovascular:  Positive for chest pain. Negative for palpitations.  Gastrointestinal:  Negative for abdominal pain, nausea and vomiting.  Genitourinary:  Negative for dysuria and hematuria.  Musculoskeletal:  Negative for arthralgias and back pain.  Skin:  Negative for color change and rash.  Neurological:  Negative for seizures and syncope.  All other systems reviewed and are negative.  Physical Exam Updated Vital Signs  ED Triage Vitals  Enc Vitals Group     BP 06/23/21 1200 (!) 137/98     Pulse Rate 06/23/21 1200 62     Resp 06/23/21 1200 18     Temp 06/23/21 1200 97.7 F (36.5 C)     Temp Source 06/23/21 1200 Oral     SpO2 06/23/21 1200 99 %     Weight --      Height --      Head Circumference --      Peak Flow --      Pain Score 06/23/21 1159 10     Pain Loc --      Pain Edu? --      Excl. in [place]? --     Physical Exam Vitals and nursing note reviewed.  Constitutional:      General: She is not in acute distress.    Appearance: She is well-developed. She is not ill-appearing.  HENT:     Head: Normocephalic and atraumatic.  Eyes:     Extraocular Movements: Extraocular movements intact.     Conjunctiva/sclera: Conjunctivae normal.     Pupils: Pupils are equal, round, and reactive to light.  Cardiovascular:     Rate and Rhythm: Normal  rate and regular rhythm.     Pulses:          Radial pulses are 2+ on the right side and 2+ on the left side.     Heart sounds: Normal heart sounds. No murmur heard. Pulmonary:     Effort: Pulmonary effort is normal. No respiratory distress.     Breath sounds: Normal breath sounds. No decreased breath sounds or wheezing.  Chest:     Chest wall: Tenderness present.  Abdominal:     Palpations: Abdomen is soft.     Tenderness: There is no abdominal tenderness.  Musculoskeletal:        General: Normal range of motion.     Cervical back: Normal range of motion and neck supple.     Right lower leg: No edema.     Left lower leg: No edema.  Skin:    General: Skin is warm and dry.     Capillary Refill: Capillary refill takes less than 2 seconds.  Neurological:     General: No focal deficit present.     Mental Status: She is alert.  Psychiatric:        Mood and Affect: Mood normal.    ED Results / Procedures / Treatments   Labs (all labs ordered are listed, but only abnormal results are displayed) Labs Reviewed  CBC WITH DIFFERENTIAL/PLATELET - Abnormal; Notable for the following components:      Result Value   RBC 3.28 (*)    Hemoglobin 10.2 (*)    HCT 30.0 (*)    All other components within normal limits  COMPREHENSIVE METABOLIC PANEL - Abnormal; Notable for the following components:   Glucose, Bld 131 (*)    BUN 50 (*)    Creatinine, Ser 3.92 (*)    Albumin 3.4 (*)    AST 44 (*)    GFR, Estimated 12 (*)    All other components within normal limits  LIPASE, BLOOD - Abnormal; Notable for the following components:   Lipase 69 (*)  All other components within normal limits  RAPID URINE DRUG SCREEN, HOSP PERFORMED  TROPONIN I (HIGH SENSITIVITY)  TROPONIN I (HIGH SENSITIVITY)    EKG EKG Interpretation  Date/Time:  Saturday June 23 2021 11:50:43 EDT Ventricular Rate:  63 PR Interval:  132 QRS Duration: 81 QT Interval:  407 QTC Calculation: 417 R Axis:   31 Text  Interpretation: Sinus rhythm Confirmed by Lennice Sites ([id number]) on 06/23/2021 11:52:18 AM  Radiology DG Chest Portable 1 View  Result Date: 06/23/2021 CLINICAL DATA:  Chest pain. EXAM: PORTABLE CHEST 1 VIEW COMPARISON:  07/30/2014 FINDINGS: The heart size and mediastinal contours are within normal limits. Both lungs are clear. The visualized skeletal structures are unremarkable. IMPRESSION: No active disease. Electronically Signed   By: Marlaine Hind M.D.   On: 06/23/2021 12:51    Procedures Procedures   Medications Ordered in ED Medications - No data to display  ED Course  I have reviewed the triage vital signs and the nursing notes.  Pertinent labs & imaging results that were available during my care of the patient were reviewed by me and considered in my medical decision making (see chart for details).    MDM Rules/Calculators/A&P                           Johanna CYNITHA BERTE is here with chest pain.  History of hypertension, diabetes, schizophrenia.  Unremarkable vitals.  No fever.  Chest pain on and off for the last several weeks.  Reproducible and worse with movement of her arm.  Denies any respiratory symptoms.  EKG shows sinus rhythm.  No ischemic changes.  Chest x-ray with no evidence of pneumonia or pneumothorax or pleural effusion.  She does have cardiac risk factors but story does not sound cardiac in nature.  Initial troponin is within normal limits.  No concern for PE as she is not have any respiratory symptoms.  No recent surgery or travel.  No hypoxia.  No significant anemia, electrolyte abnormality, kidney injury otherwise.  Creatinine appears to be at baseline.  Plan is for second troponin if within normal limits recommend Tylenol at home and supportive care as suspect this could be muscular.  We will have her follow-up with primary care doctor.  However, repeat troponin is pending at time of handoff to oncoming ED staff.  Please see their note for further results, evaluation,  disposition of the patient.  This chart was dictated using voice recognition software.  Despite best efforts to proofread,  errors can occur which can change the documentation meaning.   Final Clinical Impression(s) / ED Diagnoses Final diagnoses:  Atypical chest pain    Rx / DC Orders ED Discharge Orders     None        Lennice Sites, DO 06/23/21 1512

## 2021-06-24 LAB — HCV RNA QUANT: Hepatitis C Quantitation: NOT DETECTED IU/mL

## 2021-06-24 LAB — RPR: RPR Ser Ql: NONREACTIVE

## 2021-06-24 LAB — HIV ANTIBODY (ROUTINE TESTING W REFLEX): HIV Screen 4th Generation wRfx: NONREACTIVE

## 2021-06-25 LAB — CERVICOVAGINAL ANCILLARY ONLY
Chlamydia: NEGATIVE
Comment: NEGATIVE
Comment: NEGATIVE
Comment: NORMAL
Neisseria Gonorrhea: NEGATIVE
Trichomonas: NEGATIVE

## 2021-07-02 ENCOUNTER — Telehealth: Payer: Self-pay

## 2021-07-02 NOTE — Telephone Encounter (Signed)
Haila called to cancel her surgery with Dr. Amalia Hailey on 07/05/2021. She stated her mother passed away yesterday and she needs to take care of funeral arrangements. She stated she will call back to reschedule. Notified Dr. Amalia Hailey and Caren Griffins with [facility]

## 2021-07-08 ENCOUNTER — Other Ambulatory Visit: Payer: Self-pay | Admitting: Family Medicine

## 2021-07-08 DIAGNOSIS — E1100 Type 2 diabetes mellitus with hyperosmolarity without nonketotic hyperglycemic-hyperosmolar coma (NKHHC): Secondary | ICD-10-CM

## 2021-07-08 DIAGNOSIS — Z794 Long term (current) use of insulin: Secondary | ICD-10-CM

## 2021-07-11 ENCOUNTER — Encounter: Payer: Medicaid Other | Admitting: Podiatry

## 2021-07-18 ENCOUNTER — Encounter: Payer: Medicaid Other | Admitting: Podiatry

## 2021-07-20 ENCOUNTER — Other Ambulatory Visit: Payer: Self-pay

## 2021-07-20 DIAGNOSIS — Z794 Long term (current) use of insulin: Secondary | ICD-10-CM

## 2021-07-20 MED ORDER — ACCU-CHEK AVIVA PLUS VI STRP: ORAL_STRIP | 11 refills | Status: DC

## 2021-08-01 ENCOUNTER — Encounter: Payer: Medicaid Other | Admitting: Podiatry

## 2021-09-07 ENCOUNTER — Other Ambulatory Visit: Payer: Self-pay

## 2021-09-07 ENCOUNTER — Ambulatory Visit: Payer: Medicaid Other

## 2021-09-21 ENCOUNTER — Encounter: Payer: Self-pay | Admitting: Family Medicine

## 2021-09-21 ENCOUNTER — Other Ambulatory Visit: Payer: Self-pay

## 2021-09-21 ENCOUNTER — Other Ambulatory Visit (HOSPITAL_COMMUNITY)
Admission: RE | Admit: 2021-09-21 | Discharge: 2021-09-21 | Disposition: A | Discharge status: Home / Self Care | Payer: Medicaid Other | Source: Ambulatory Visit | Attending: Family Medicine | Admitting: Family Medicine

## 2021-09-21 ENCOUNTER — Ambulatory Visit (INDEPENDENT_AMBULATORY_CARE_PROVIDER_SITE_OTHER): Payer: Medicaid Other | Admitting: Family Medicine

## 2021-09-21 VITALS — BP 150/90 | HR 84 | Ht 61.0 in | Wt 170.0 lb

## 2021-09-21 DIAGNOSIS — I10 Essential (primary) hypertension: Secondary | ICD-10-CM

## 2021-09-21 DIAGNOSIS — Z1159 Encounter for screening for other viral diseases: Secondary | ICD-10-CM | POA: Diagnosis not present

## 2021-09-21 DIAGNOSIS — Z113 Encounter for screening for infections with a predominantly sexual mode of transmission: Secondary | ICD-10-CM | POA: Insufficient documentation

## 2021-09-21 DIAGNOSIS — Z23 Encounter for immunization: Secondary | ICD-10-CM

## 2021-09-21 DIAGNOSIS — E1100 Type 2 diabetes mellitus with hyperosmolarity without nonketotic hyperglycemic-hyperosmolar coma (NKHHC): Secondary | ICD-10-CM

## 2021-09-21 DIAGNOSIS — Z794 Long term (current) use of insulin: Secondary | ICD-10-CM

## 2021-09-21 DIAGNOSIS — Z114 Encounter for screening for human immunodeficiency virus [HIV]: Secondary | ICD-10-CM | POA: Diagnosis not present

## 2021-09-21 LAB — POCT GLYCOSYLATED HEMOGLOBIN (HGB A1C): HbA1c, POC (controlled diabetic range): 6.2 % (ref 0.0–7.0)

## 2021-09-21 LAB — POCT WET PREP (WET MOUNT)
Clue Cells Wet Prep Whiff POC: NEGATIVE
Trichomonas Wet Prep HPF POC: ABSENT

## 2021-09-21 NOTE — Assessment & Plan Note (Signed)
Hemoglobin A1c 6.2 from 5.7 at previous check.  She is currently on Lantus 32 units daily.  We will continue this current regimen.  Discussed addition of SGLT2 but patient has declined.  Discussed the benefits but patient still adamant that she does not want to make any changes to her regimen at this time.  Follow-up in 3 months.

## 2021-09-21 NOTE — Progress Notes (Signed)
° ° °  SUBJECTIVE:   CHIEF COMPLAINT / HPI:   Diabetes checkup Patient presents for diabetes checkup.  Most recent hemoglobin A1c was on 11 4 and was 5.7.  She is on Lantus 32 units daily.  Hemoglobin A1c today of 6.2.  Reports that her blood sugars have been well controlled since decreasing to 32 units and she has not had any hypoglycemic episodes.  Her lowest blood sugar was in the 90s.  We discussed possible additions of other medications for cardiac and renal protection and we could possibly decrease the Lantus even further but she is not interested in any changes at this time.  STD screening Patient reports she has been sexually active with a new partner recently and was unprotected sex.  Is interested in testing for this.  Would like swabs as well as blood testing.  Denies any symptoms.   OBJECTIVE:   BP (!) 150/90    Pulse 84    Ht 5\' 1"  (1.549 m)    Wt 170 lb (77.1 kg)    SpO2 99%    BMI 32.12 kg/m   General: Pleasant 61 year old female in no acute distress Cardiac: Regular rate Respiratory: Normal work of breathing, speaking in full sentences Abdomen: Soft, nontender MSK: No gross abnormality GU: Normal external female genitalia, patient does point out mole inferior and lateral to right labia which looks like normal nevus, normal internal vaginal tissue with no abnormal discharge, cervix is nonfriable, nonerythematous  ASSESSMENT/PLAN:   DM (diabetes mellitus) (HCC) Hemoglobin A1c 6.2 from 5.7 at previous check.  She is currently on Lantus 32 units daily.  We will continue this current regimen.  Discussed addition of SGLT2 but patient has declined.  Discussed the benefits but patient still adamant that she does not want to make any changes to her regimen at this time.  Follow-up in 3 months.  Screening examination for STD (sexually transmitted disease) Patient with recent unprotected sex with a new partner.  Requesting STI testing.  Swabs collected as well as blood.  Will call  patient with results when they come back.     Gifford Shave, MD [place]

## 2021-09-21 NOTE — Assessment & Plan Note (Signed)
Patient with recent unprotected sex with a new partner.  Requesting STI testing.  Swabs collected as well as blood.  Will call patient with results when they come back.

## 2021-09-21 NOTE — Patient Instructions (Addendum)
It was great seeing you today!  Your hemoglobin A1c is 6.2 which is wonderful.  We will continue on the 32 units of Lantus daily.  If you are interested in starting another medication so that we may be able to come down on your Lantus please let me know.  This medication will also help protect your kidneys.  I am checking your kidney levels today and please be sure to follow-up with the kidney doctor on the 10th.  If you have any questions or concerns call the clinic.  Regarding your STD screening I have collected the samples and will call you with the results when they come back.  I will see you in 3 months for another diabetes checkup.  I hope you have a wonderful afternoon!

## 2021-09-22 LAB — BASIC METABOLIC PANEL
BUN/Creatinine Ratio: 14 (ref 12–28)
BUN: 51 mg/dL — ABNORMAL HIGH (ref 8–27)
CO2: 24 mmol/L (ref 20–29)
Calcium: 10 mg/dL (ref 8.7–10.3)
Chloride: 101 mmol/L (ref 96–106)
Creatinine, Ser: 3.75 mg/dL — ABNORMAL HIGH (ref 0.57–1.00)
Glucose: 134 mg/dL — ABNORMAL HIGH (ref 70–99)
Potassium: 4.2 mmol/L (ref 3.5–5.2)
Sodium: 142 mmol/L (ref 134–144)
eGFR: 13 mL/min/{1.73_m2} — ABNORMAL LOW (ref >59)

## 2021-09-22 LAB — RPR: RPR Ser Ql: NONREACTIVE

## 2021-09-22 LAB — HIV ANTIBODY (ROUTINE TESTING W REFLEX): HIV Screen 4th Generation wRfx: NONREACTIVE

## 2021-09-24 ENCOUNTER — Other Ambulatory Visit: Payer: Self-pay | Admitting: Family Medicine

## 2021-09-24 LAB — CERVICOVAGINAL ANCILLARY ONLY
Chlamydia: NEGATIVE
Comment: NEGATIVE
Comment: NEGATIVE
Comment: NORMAL
Neisseria Gonorrhea: NEGATIVE
Trichomonas: NEGATIVE

## 2021-09-26 ENCOUNTER — Other Ambulatory Visit: Payer: Self-pay | Admitting: *Deleted

## 2021-09-26 MED ORDER — LORATADINE 10 MG PO TABS: 10.0000 mg | ORAL_TABLET | Freq: Every day | ORAL | 11 refills | Status: DC

## 2021-10-03 ENCOUNTER — Emergency Department (HOSPITAL_COMMUNITY)
Admission: EM | Admit: 2021-10-03 | Discharge: 2021-10-03 | Disposition: A | Discharge status: Home / Self Care | Payer: Medicaid Other | Attending: Emergency Medicine | Admitting: Emergency Medicine

## 2021-10-03 ENCOUNTER — Other Ambulatory Visit: Payer: Self-pay

## 2021-10-03 DIAGNOSIS — N189 Chronic kidney disease, unspecified: Secondary | ICD-10-CM | POA: Diagnosis not present

## 2021-10-03 DIAGNOSIS — Z79899 Other long term (current) drug therapy: Secondary | ICD-10-CM | POA: Diagnosis not present

## 2021-10-03 DIAGNOSIS — R509 Fever, unspecified: Secondary | ICD-10-CM | POA: Insufficient documentation

## 2021-10-03 DIAGNOSIS — Z794 Long term (current) use of insulin: Secondary | ICD-10-CM | POA: Diagnosis not present

## 2021-10-03 DIAGNOSIS — R0981 Nasal congestion: Secondary | ICD-10-CM | POA: Insufficient documentation

## 2021-10-03 DIAGNOSIS — Z20822 Contact with and (suspected) exposure to covid-19: Secondary | ICD-10-CM | POA: Diagnosis not present

## 2021-10-03 DIAGNOSIS — R7989 Other specified abnormal findings of blood chemistry: Secondary | ICD-10-CM | POA: Insufficient documentation

## 2021-10-03 DIAGNOSIS — M791 Myalgia, unspecified site: Secondary | ICD-10-CM | POA: Diagnosis not present

## 2021-10-03 DIAGNOSIS — R112 Nausea with vomiting, unspecified: Secondary | ICD-10-CM | POA: Insufficient documentation

## 2021-10-03 DIAGNOSIS — E1122 Type 2 diabetes mellitus with diabetic chronic kidney disease: Secondary | ICD-10-CM | POA: Insufficient documentation

## 2021-10-03 DIAGNOSIS — Z7982 Long term (current) use of aspirin: Secondary | ICD-10-CM | POA: Insufficient documentation

## 2021-10-03 DIAGNOSIS — R197 Diarrhea, unspecified: Secondary | ICD-10-CM | POA: Diagnosis not present

## 2021-10-03 DIAGNOSIS — I129 Hypertensive chronic kidney disease with stage 1 through stage 4 chronic kidney disease, or unspecified chronic kidney disease: Secondary | ICD-10-CM | POA: Insufficient documentation

## 2021-10-03 DIAGNOSIS — E860 Dehydration: Secondary | ICD-10-CM | POA: Insufficient documentation

## 2021-10-03 LAB — CBC
HCT: 31.7 % — ABNORMAL LOW (ref 36.0–46.0)
Hemoglobin: 10.6 g/dL — ABNORMAL LOW (ref 12.0–15.0)
MCH: 30.9 pg (ref 26.0–34.0)
MCHC: 33.4 g/dL (ref 30.0–36.0)
MCV: 92.4 fL (ref 80.0–100.0)
Platelets: 220 10*3/uL (ref 150–400)
RBC: 3.43 MIL/uL — ABNORMAL LOW (ref 3.87–5.11)
RDW: 12.3 % (ref 11.5–15.5)
WBC: 5.8 10*3/uL (ref 4.0–10.5)
nRBC: 0 % (ref 0.0–0.2)

## 2021-10-03 LAB — COMPREHENSIVE METABOLIC PANEL
ALT: 28 U/L (ref 0–44)
AST: 34 U/L (ref 15–41)
Albumin: 3.6 g/dL (ref 3.5–5.0)
Alkaline Phosphatase: 64 U/L (ref 38–126)
Anion gap: 10 (ref 5–15)
BUN: 45 mg/dL — ABNORMAL HIGH (ref 8–23)
CO2: 26 mmol/L (ref 22–32)
Calcium: 9.5 mg/dL (ref 8.9–10.3)
Chloride: 103 mmol/L (ref 98–111)
Creatinine, Ser: 4.31 mg/dL — ABNORMAL HIGH (ref 0.44–1.00)
GFR, Estimated: 11 mL/min — ABNORMAL LOW (ref >60)
Glucose, Bld: 101 mg/dL — ABNORMAL HIGH (ref 70–99)
Potassium: 4.3 mmol/L (ref 3.5–5.1)
Sodium: 139 mmol/L (ref 135–145)
Total Bilirubin: 0.4 mg/dL (ref 0.3–1.2)
Total Protein: 7.2 g/dL (ref 6.5–8.1)

## 2021-10-03 LAB — URINALYSIS, ROUTINE W REFLEX MICROSCOPIC
Bilirubin Urine: NEGATIVE
Glucose, UA: NEGATIVE mg/dL
Ketones, ur: NEGATIVE mg/dL
Nitrite: NEGATIVE
Protein, ur: 100 mg/dL — AB
Specific Gravity, Urine: 1.005 (ref 1.005–1.030)
pH: 7 (ref 5.0–8.0)

## 2021-10-03 LAB — LIPASE, BLOOD: Lipase: 71 U/L — ABNORMAL HIGH (ref 11–51)

## 2021-10-03 LAB — RESP PANEL BY RT-PCR (FLU A&B, COVID) ARPGX2
Influenza A by PCR: NEGATIVE
Influenza B by PCR: NEGATIVE
SARS Coronavirus 2 by RT PCR: NEGATIVE

## 2021-10-03 MED ORDER — SODIUM CHLORIDE 0.9 % IV BOLUS
1000.0000 mL | Freq: Once | INTRAVENOUS | Status: AC
  Administered 2021-10-03: 1000 mL via INTRAVENOUS

## 2021-10-03 MED ORDER — ONDANSETRON HCL 4 MG/2ML IJ SOLN
4.0000 mg | Freq: Once | INTRAMUSCULAR | Status: DC
  Filled 2021-10-03: qty 2

## 2021-10-03 NOTE — ED Provider Notes (Signed)
[facility] EMERGENCY DEPARTMENT Provider Note   CSN: [id number] Arrival date & time: 10/03/21  0815     History  Chief Complaint  Patient presents with   Emesis   Fever   Nausea   Chills    Kayla Gregory is a 61 y.o. female.  The history is provided by the patient and medical records. No language interpreter was used.  Emesis Associated symptoms: fever   Fever Associated symptoms: vomiting    61 year old female with significant history of diabetes, hypertension, chronic hepatitis C, schizophrenia, hyperlipidemia, GERD, CKD Who presents with complaints of nausea vomiting diarrhea.  Patient report for the past 2 days she has had subjective fever, chills, body aches, congestion, occasional cough, sore throat, as well as having some loose stools.  She endorses mild nausea and vomiting on occasion.  She reports she may have been exposed to some sick contact when she brought her family member to the hospital several days prior.  She denies any specific treatment tried.  She denies having any abdominal pain denies having shortness of breath, severe headache or neck stiffness.    Home Medications Prior to Admission medications   Medication Sig Start Date End Date Taking? Authorizing Provider  Accu-Chek Softclix Lancets lancets CHECK GLUCOSE ONCE DAILY IN THE MORNING 07/31/20   Benay Pike, MD  aspirin 81 MG chewable tablet 1 tablet    [provider]  atorvastatin (LIPITOR) 40 MG tablet Take 1 tablet (40 mg total) by mouth daily. TAKE 1 TABLET BY MOUTH DAILY 09/24/21   Gifford Shave, MD  Blood Glucose Monitoring Suppl (ACCU-CHEK AVIVA PLUS) w/Device KIT 1 application by Does not apply route 4 (four) times daily -  with meals and at bedtime. 11/23/18   Diallo, Earna Coder, MD  calcitRIOL (ROCALTROL) 0.25 MCG capsule Take 0.25 mcg by mouth daily.    [provider]  COVID-19 mRNA bivalent vaccine, Moderna, (MODERNA COVID-19 BIVAL BOOSTER) 50  MCG/0.5ML injection Inject into the muscle. 06/21/21   Carlyle Basques, MD  FLUoxetine (PROZAC) 20 MG capsule Take 1 capsule (20 mg total) by mouth daily. 06/22/18   Diallo, Earna Coder, MD  gabapentin (NEURONTIN) 300 MG capsule Take 300 mg by mouth 3 (three) times daily.    [provider]  glucose blood (ACCU-CHEK AVIVA PLUS) test strip Use to check blood glucose up to four times daily 07/20/21   Gifford Shave, MD  influenza vac split quadrivalent PF (FLUARIX) 0.5 ML injection Inject into the muscle. 06/21/21   Carlyle Basques, MD  insulin glargine (LANTUS SOLOSTAR) 100 UNIT/ML Solostar Pen Inject 32 Units into the skin daily. 07/09/21   Gifford Shave, MD  Insulin Pen Needle (B-D ULTRAFINE III SHORT PEN) 31G X 8 MM MISC USE DAILY AS DIRECTED 12/18/20   Benay Pike, MD  Lancet Device MISC Check blood glucose once a day in the AM. 04/13/20   Benay Pike, MD  Lancet Devices [facility]) lancets Use as instructed 11/22/16   Marjie Skiff, MD  loratadine (CLARITIN) 10 MG tablet Take 1 tablet (10 mg total) by mouth daily. 09/26/21   Gifford Shave, MD  pantoprazole (PROTONIX) 40 MG tablet Take 1 tablet (40 mg total) by mouth daily. TAKE 1 TABLET(40 MG) BY MOUTH DAILY 09/24/21   Gifford Shave, MD  Propylene Glycol (SYSTANE COMPLETE) 0.6 % SOLN Place 2-3 drops into both eyes daily as needed (for dry eyes).     [provider]  tobramycin (TOBREX) 0.3 % ophthalmic solution Place  1 drop into both eyes every 6 (six) hours. 03/26/19   [provider]  triamcinolone cream (KENALOG) 0.1 % Apply 1 application topically 2 (two) times daily. 05/11/20   Benay Pike, MD  valsartan-hydrochlorothiazide (DIOVAN-HCT) 160-25 MG tablet Take 1 tablet by mouth daily. 12/29/20   Zenia Resides, MD  vitamin B-12 (CYANOCOBALAMIN) 1000 MCG tablet Take 1,000 mcg by mouth daily.    [provider]  ziprasidone (GEODON) 20 MG capsule Take 1 capsule (20 mg total) by mouth daily.  06/22/18   Marjie Skiff, MD      Allergies    Metformin and related    Review of Systems   Review of Systems  Constitutional:  Positive for fever.  Gastrointestinal:  Positive for vomiting.  All other systems reviewed and are negative.  Physical Exam Updated Vital Signs BP (!) 162/78 (BP Location: Left Arm)    Pulse 66    Temp 98.2 F (36.8 C) (Oral)    Resp 18    SpO2 98%  Physical Exam Vitals and nursing note reviewed.  Constitutional:      General: She is not in acute distress.    Appearance: She is well-developed.  HENT:     Head: Atraumatic.     Mouth/Throat:     Mouth: Mucous membranes are moist.  Eyes:     Conjunctiva/sclera: Conjunctivae normal.  Cardiovascular:     Rate and Rhythm: Normal rate and regular rhythm.     Pulses: Normal pulses.     Heart sounds: Normal heart sounds.  Pulmonary:     Effort: Pulmonary effort is normal.     Breath sounds: No wheezing, rhonchi or rales.  Abdominal:     Palpations: Abdomen is soft.     Tenderness: There is no abdominal tenderness.  Musculoskeletal:     Cervical back: Neck supple. No rigidity or tenderness.  Lymphadenopathy:     Cervical: No cervical adenopathy.  Skin:    Findings: No rash.  Neurological:     Mental Status: She is alert. Mental status is at baseline.  Psychiatric:        Mood and Affect: Mood normal.    ED Results / Procedures / Treatments   Labs (all labs ordered are listed, but only abnormal results are displayed) Labs Reviewed  LIPASE, BLOOD - Abnormal; Notable for the following components:      Result Value   Lipase 71 (*)    All other components within normal limits  COMPREHENSIVE METABOLIC PANEL - Abnormal; Notable for the following components:   Glucose, Bld 101 (*)    BUN 45 (*)    Creatinine, Ser 4.31 (*)    GFR, Estimated 11 (*)    All other components within normal limits  CBC - Abnormal; Notable for the following components:   RBC 3.43 (*)    Hemoglobin 10.6 (*)    HCT  31.7 (*)    All other components within normal limits  URINALYSIS, ROUTINE W REFLEX MICROSCOPIC - Abnormal; Notable for the following components:   Color, Urine STRAW (*)    Hgb urine dipstick SMALL (*)    Protein, ur 100 (*)    Leukocytes,Ua TRACE (*)    Bacteria, UA RARE (*)    All other components within normal limits  RESP PANEL BY RT-PCR (FLU A&B, COVID) ARPGX2    EKG None  Radiology No results found.  Procedures Procedures    Medications Ordered in ED Medications  ondansetron (ZOFRAN) injection 4 mg (  4 mg Intravenous Patient Refused/Not Given 10/03/21 1359)  sodium chloride 0.9 % bolus 1,000 mL (1,000 mLs Intravenous New Bag/Given 10/03/21 1404)    ED Course/ Medical Decision Making/ A&P                           Medical Decision Making Amount and/or Complexity of Data Reviewed Labs: ordered.  Risk Prescription drug management.   BP (!) 162/78 (BP Location: Left Arm)    Pulse 66    Temp 98.2 F (36.8 C) (Oral)    Resp 18    SpO2 98%   1:16 PM This is a 61 year old female with multiple comorbidities who is here with cold symptoms for the past 2 days.  She endorsed mild nausea vomiting diarrhea as well as congestion, cough, throat irritation and some body aches.  Patient overall well-appearing, vital signs reviewed by me shows elevated blood pressure of 162/78.  She does have known history of hypertension.  On exam, patient is well-appearing, heart and lung sounds normal, abdomen is soft nontender.  Labs was independently reviewed interpreted by me.  A viral respiratory panel obtained which is negative for COVID and flu.  Her lipase is mildly elevated at 71 however she does not have any significant abdominal discomfort to suggest acute pancreatitis.  Evidence of worsening CKD with a creatinine of 4.31 likely secondary to nausea vomit diarrhea that she has been experiencing.  Normal WBC, hemoglobin is 10.6 which is similar to prior.  Urinalysis obtained shows no evidence  of UTI.  At this time, I felt it would be reasonable to provide patient with symptomatic treatment which include IV fluid and antinausea medication.    This patient presents to the ED for concern of n/v/d, this involves an extensive number of treatment options, and is a complaint that carries with it a high risk of complications and morbidity.  The differential diagnosis includes viral GI, viral resp illness, colitis, diverticulitis, pancreatitis, appendicitis, UTI, hyperglycemis state, pna  Co morbidities that complicate the patient evaluation DM  CKD  Hepatitis Additional history obtained:  Additional history obtained from no one External records from outside source obtained and reviewed including PCP notes  Lab Tests:  I Ordered, and personally interpreted labs.  The pertinent results include:  as discussed above   Cardiac Monitoring:  The patient was maintained on a cardiac monitor.  I personally viewed and interpreted the cardiac monitored which showed an underlying rhythm of: sinus rhythm  Medicines ordered and prescription drug management:  I ordered medication including IVF  for dehydration Reevaluation of the patient after these medicines showed that the patient improved I have reviewed the patients home medicines and have made adjustments as needed  Test Considered: as above  Critical Interventions: as above    Problem List / ED Course: n/v/d  Reevaluation:  After the interventions noted above, I reevaluated the patient and found that they have :improved  Social Determinants of Health: multiple comorbidities  Dispostion:  After consideration of the diagnostic results and the patients response to treatment, I feel that the patent would benefit from outpt f/u.         Final Clinical Impression(s) / ED Diagnoses Final diagnoses:  Nausea vomiting and diarrhea  Dehydration    Rx / DC Orders ED Discharge Orders     None         Domenic Moras,  PA-C 10/03/21 1456    Carmin Muskrat, MD 10/03/21 1510

## 2021-10-03 NOTE — Discharge Instructions (Addendum)
You have been evaluated for your symptoms.  Your symptoms likely due to a stomach bug causing nausea vomit diarrhea.  Please continue to stay hydrated.  You are given IV fluid for dehydration.  Follow-up with your primary care doctor for recheck of your labs.  Return if you have any concern.

## 2021-10-03 NOTE — ED Triage Notes (Signed)
Pt. Stated, Ive had N//V/D with some chills and feel bad.

## 2021-11-19 ENCOUNTER — Ambulatory Visit: Payer: Medicaid Other | Admitting: Podiatry

## 2021-12-05 ENCOUNTER — Ambulatory Visit (INDEPENDENT_AMBULATORY_CARE_PROVIDER_SITE_OTHER): Payer: Medicaid Other | Admitting: Podiatry

## 2021-12-05 ENCOUNTER — Ambulatory Visit (INDEPENDENT_AMBULATORY_CARE_PROVIDER_SITE_OTHER): Payer: Medicaid Other

## 2021-12-05 DIAGNOSIS — M2012 Hallux valgus (acquired), left foot: Secondary | ICD-10-CM

## 2021-12-05 NOTE — Progress Notes (Signed)
? ?  Subjective: 61 y.o. female presents today for follow-up evaluation of a symptomatic bunion to the left foot.  Patient was originally scheduled for surgery last year but she says that her mother passed away and she is not in a position to pursue surgery at that time.  She is ready to proceed with surgery.  She presents for further treatment and evaluation and for surgical consultation ? ?Past Medical History:  ?Diagnosis Date  ? Arthritis   ? Diabetes mellitus without complication ([place]) 1115  ? Environmental and seasonal allergies   ? GERD (gastroesophageal reflux disease)   ? Hepatitis C, chronic ([place])   ? treated  ? Hyperlipidemia associated with type 2 diabetes mellitus ([place])   ? Hypertension associated with diabetes ([place])   ? Myogenic ptosis of left eyelid   ? Schizophrenia ([place])   ? ?Past Surgical History:  ?Procedure Laterality Date  ? CATARACT EXTRACTION W/ INTRAOCULAR LENS  IMPLANT, BILATERAL    ? COLONOSCOPY    ? JOINT REPLACEMENT Left   ? Hip  ? PTOSIS REPAIR Left 12/25/2017  ? Procedure: INTERNAL PTOSIS REPAIR LEFT EYE;  Surgeon: Clista Bernhardt, MD;  Location: [facility];  Service: Ophthalmology;  Laterality: Left;  ? TONSILLECTOMY    ? ?Allergies  ?Allergen Reactions  ? Metformin And Related Diarrhea and Other (See Comments)  ?  GI symptoms including abdominal pain and diarrhea - Pt not interested in taking again  ? ? ? ? ?Objective: ?Physical Exam ?General: The patient is alert and oriented x3 in no acute distress. ? ?Dermatology: Skin is cool, dry and supple bilateral lower extremities. Negative for open lesions or macerations. ? ?Vascular: Palpable pedal pulses bilaterally. No edema or erythema noted. Capillary refill within normal limits. ? ?Neurological: Epicritic and protective threshold grossly intact bilaterally.  ? ?Musculoskeletal Exam: Clinical evidence of bunion deformity noted to the respective foot. There is moderate pain on palpation range of motion of the first MPJ. Lateral deviation of  the hallux noted consistent with hallux abductovalgus. ? ?Radiographic Exam LT foot: Increased intermetatarsal angle greater than 15? with a hallux abductus angle greater than 30? noted on AP view. Moderate degenerative changes noted within the first MPJ. ? ?Assessment: ?1. HAV w/ bunion deformity left ?2. H/o bunionectomy surgery RT with good success ? ? ?Plan of Care:  ?1. Patient was evaluated. X-Rays reviewed. ?2. Today again we discussed the conservative versus surgical management of the presenting pathology. The patient opts for surgical management. All possible complications and details of the procedure were explained. All patient questions were answered. No guarantees were expressed or implied. ?3. Authorization for surgery was initiated today. Surgery will consist of bunionectomy with first metatarsal osteotomy left ?4.  Return to clinic 1 week postop ? ?   ? ? ?Edrick Kins, DPM ?[place] ? ?Dr. Edrick Kins, DPM  ?  ?[address]                                       ?[place], [place] [address]                ?Office [phone]  ?Fax [phone] ? ? ? ? ? ?

## 2021-12-21 ENCOUNTER — Encounter: Payer: Self-pay | Admitting: Family Medicine

## 2021-12-21 ENCOUNTER — Other Ambulatory Visit (HOSPITAL_COMMUNITY)
Admission: RE | Admit: 2021-12-21 | Discharge: 2021-12-21 | Disposition: A | Discharge status: Home / Self Care | Payer: Medicaid Other | Source: Ambulatory Visit | Attending: Family Medicine | Admitting: Family Medicine

## 2021-12-21 ENCOUNTER — Ambulatory Visit (INDEPENDENT_AMBULATORY_CARE_PROVIDER_SITE_OTHER): Payer: Medicaid Other | Admitting: Family Medicine

## 2021-12-21 VITALS — BP 195/83 | HR 58 | Ht 61.0 in | Wt 165.6 lb

## 2021-12-21 DIAGNOSIS — N184 Chronic kidney disease, stage 4 (severe): Secondary | ICD-10-CM | POA: Diagnosis not present

## 2021-12-21 DIAGNOSIS — I10 Essential (primary) hypertension: Secondary | ICD-10-CM

## 2021-12-21 DIAGNOSIS — E1100 Type 2 diabetes mellitus with hyperosmolarity without nonketotic hyperglycemic-hyperosmolar coma (NKHHC): Secondary | ICD-10-CM

## 2021-12-21 DIAGNOSIS — Z113 Encounter for screening for infections with a predominantly sexual mode of transmission: Secondary | ICD-10-CM | POA: Insufficient documentation

## 2021-12-21 DIAGNOSIS — N750 Cyst of Bartholin's gland: Secondary | ICD-10-CM | POA: Diagnosis not present

## 2021-12-21 DIAGNOSIS — Z114 Encounter for screening for human immunodeficiency virus [HIV]: Secondary | ICD-10-CM | POA: Diagnosis not present

## 2021-12-21 DIAGNOSIS — Z1159 Encounter for screening for other viral diseases: Secondary | ICD-10-CM

## 2021-12-21 DIAGNOSIS — N898 Other specified noninflammatory disorders of vagina: Secondary | ICD-10-CM | POA: Diagnosis present

## 2021-12-21 DIAGNOSIS — Z794 Long term (current) use of insulin: Secondary | ICD-10-CM

## 2021-12-21 LAB — POCT WET PREP (WET MOUNT)
Clue Cells Wet Prep Whiff POC: NEGATIVE
Trichomonas Wet Prep HPF POC: ABSENT
WBC, Wet Prep HPF POC: >20

## 2021-12-21 LAB — POCT GLYCOSYLATED HEMOGLOBIN (HGB A1C): HbA1c, POC (controlled diabetic range): 5.9 % (ref 0.0–7.0)

## 2021-12-21 MED ORDER — LANTUS SOLOSTAR 100 UNIT/ML ~~LOC~~ SOPN: 26.0000 [IU] | PEN_INJECTOR | Freq: Every day | SUBCUTANEOUS | 3 refills | Status: DC

## 2021-12-21 NOTE — Assessment & Plan Note (Addendum)
Vaginal lesion in the location of Bartholin gland.  Patient has not had any symptoms of this lesion and did not even know it was there.  I have performed pelvic exams on this patient in the past and was not present on previous exams.  Abnormal coloring to the lesion and feel gynecology evaluation is warranted.  Referral placed for gynecology. ?

## 2021-12-21 NOTE — Progress Notes (Signed)
? ? ?  SUBJECTIVE:  ? ?CHIEF COMPLAINT / HPI:  ? ?Diabetes checkup ?Currently on 36 units of Lantus.  Last hemoglobin A1c 6.2.  Patient reports good compliance with her Lantus 36 units daily.  Reports 1 recent hypoglycemic episode at 55 where she did not feel the 65 but when she checked it noticed it so had some thing with sugar in it.  He has like her blood sugars have been well controlled.  Is not interested in any additional medications. ? ?Vaginal discharge  ?Sexually active with 1 partner.  Is unclear if he is with multiple partners.  Would like screening for STDs.  Has had abnormal vaginal discharge. ? ?CKD ?Patient reports that she saw nephrologist a few months ago and they would like for her to get fistula placement for dialysis.  Patient is not interested in dialysis at this time.  She would like a kidney transplant.  Discussed the possible need for dialysis and patient agrees she was talk further with the nephrologist. ? ?HTN ?Patient currently medicated for hypertension but did not take her medicines this morning.  Extremely elevated blood pressure with manual recheck of 182/76. ?OBJECTIVE:  ? ?BP (!) 195/83   Pulse (!) 58   Ht 5\' 1"  (1.549 m)   Wt 165 lb 9.6 oz (75.1 kg)   SpO2 100%   BMI 31.29 kg/m?   ?General: Pleasant 61 year old female in no acute distress ?Cardiac: Regular rate and rhythm ?Respiratory: Normal work of breathing, speaking in full sentences ?Abdomen: Soft, nontender ?GU: Normal external vaginal tissue, dark blue lesion just inside vagina on the right side, nontender, nonerythematous.  Normal appearing cervix with mild to moderate amount of vaginal discharge ? ?ASSESSMENT/PLAN:  ? ?Bartholin cyst ?Vaginal lesion in the location of Bartholin gland.  Patient has not had any symptoms of this lesion and did not even know it was there.  I have performed pelvic exams on this patient in the past and was not present on previous exams.  Abnormal coloring to the lesion and feel gynecology  evaluation is warranted.  Referral placed for gynecology. ? ?Screening examination for STD (sexually transmitted disease) ?Patient intermittently sexually active without protection.  Would like STI testing today.  Swabs collected as well as blood test.  Will call patient with results when they come back. ? ?CKD (chronic kidney disease), stage IV ([place]) ?Patient has seen nephrologist who recommended fistula placement for dialysis needs.  Discussed at length what declining dialysis if she needs it means and patient understands.  She is interested in a transplant but will further discuss dialysis with nephrologist.  Checking a BMP today. ? ?DM (diabetes mellitus) ([place]) ?Hemoglobin A1c 5.9 from 6.2 previous check.  Currently on Lantus 32 units daily.  Decreasing Lantus to 26 units daily at this time.  Patient will continue to keep blood sugar log and follow-up in 3 months for repeat hemoglobin A1c. ? ?Essential hypertension ?Blood pressure is elevated today but patient has not taken her medications.  Blood pressure readings at home are within goal.  Patient is going to follow-up with me in 1 month for blood pressure check and we will make adjustments to meds as needed.  Instructed patient to take her medications prior to the visit and patient is agreeable.  Checking a BMP today. ?  ? ? ?Gifford Shave, MD ?[place]  ? ?

## 2021-12-21 NOTE — Patient Instructions (Signed)
It was a pleasure seeing you today.  Your hemoglobin A1c was 5.9 from 6.2 at the previous check which is wonderful.  Your blood sugars also look great.  I want to decrease your Lantus to 26 units daily.  I am checking your kidney function today but recommend you have close follow-up with your kidney doctors.  Please consider following their recommendations regarding possible need for dialysis in the future.  Regarding your vaginal discharge we collected samples to check for STDs but we did notice a discolored lesion in your vaginal tissue which we feel you need to be seen by a gynecologist.  A referral has been placed and someone should call you to schedule this appointment.  If you have any questions or concerns please give [place] a call.  We will see you in 3 months for your repeat hemoglobin A1c and management of your diabetes.  I hope you have a wonderful day! ? ? ?

## 2021-12-21 NOTE — Assessment & Plan Note (Signed)
Patient has seen nephrologist who recommended fistula placement for dialysis needs.  Discussed at length what declining dialysis if she needs it means and patient understands.  She is interested in a transplant but will further discuss dialysis with nephrologist.  Checking a BMP today. ?

## 2021-12-21 NOTE — Assessment & Plan Note (Signed)
Blood pressure is elevated today but patient has not taken her medications.  Blood pressure readings at home are within goal.  Patient is going to follow-up with me in 1 month for blood pressure check and we will make adjustments to meds as needed.  Instructed patient to take her medications prior to the visit and patient is agreeable.  Checking a BMP today. ?

## 2021-12-21 NOTE — Assessment & Plan Note (Signed)
Hemoglobin A1c 5.9 from 6.2 previous check.  Currently on Lantus 32 units daily.  Decreasing Lantus to 26 units daily at this time.  Patient will continue to keep blood sugar log and follow-up in 3 months for repeat hemoglobin A1c. ?

## 2021-12-21 NOTE — Assessment & Plan Note (Signed)
Patient intermittently sexually active without protection.  Would like STI testing today.  Swabs collected as well as blood test.  Will call patient with results when they come back. ?

## 2021-12-22 LAB — RPR: RPR Ser Ql: NONREACTIVE

## 2021-12-22 LAB — BASIC METABOLIC PANEL
BUN/Creatinine Ratio: 12 (ref 12–28)
BUN: 43 mg/dL — ABNORMAL HIGH (ref 8–27)
CO2: 22 mmol/L (ref 20–29)
Calcium: 9.2 mg/dL (ref 8.7–10.3)
Chloride: 104 mmol/L (ref 96–106)
Creatinine, Ser: 3.59 mg/dL — ABNORMAL HIGH (ref 0.57–1.00)
Glucose: 78 mg/dL (ref 70–99)
Potassium: 4.2 mmol/L (ref 3.5–5.2)
Sodium: 141 mmol/L (ref 134–144)
eGFR: 14 mL/min/{1.73_m2} — ABNORMAL LOW (ref >59)

## 2021-12-22 LAB — HIV ANTIBODY (ROUTINE TESTING W REFLEX): HIV Screen 4th Generation wRfx: NONREACTIVE

## 2021-12-24 LAB — CERVICOVAGINAL ANCILLARY ONLY
Chlamydia: NEGATIVE
Comment: NEGATIVE
Comment: NEGATIVE
Comment: NORMAL
Neisseria Gonorrhea: NEGATIVE
Trichomonas: NEGATIVE

## 2021-12-25 ENCOUNTER — Telehealth: Payer: Self-pay | Admitting: *Deleted

## 2021-12-25 ENCOUNTER — Other Ambulatory Visit: Payer: Self-pay | Admitting: Family Medicine

## 2021-12-25 DIAGNOSIS — I10 Essential (primary) hypertension: Secondary | ICD-10-CM
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
## 2021-12-26 NOTE — Telephone Encounter (Signed)
Dr. Max Sane has spoken to patient. Ottis Stain, CMA  ?

## 2022-01-08 ENCOUNTER — Ambulatory Visit: Payer: Medicaid Other | Admitting: Family Medicine

## 2022-01-09 ENCOUNTER — Telehealth: Payer: Self-pay | Admitting: *Deleted

## 2022-01-09 ENCOUNTER — Encounter: Payer: Medicaid Other | Admitting: Podiatry

## 2022-01-09 NOTE — Telephone Encounter (Signed)
Patient is calling to get a referral to [facility] at [address].  Will forward to MD.  Johnney Ou   Nephrology - [facility] [address] [place], [place] [address] [phone])

## 2022-01-15 ENCOUNTER — Encounter: Payer: Self-pay | Admitting: Family Medicine

## 2022-01-15 ENCOUNTER — Ambulatory Visit (INDEPENDENT_AMBULATORY_CARE_PROVIDER_SITE_OTHER): Payer: Medicaid Other | Admitting: Family Medicine

## 2022-01-15 DIAGNOSIS — I10 Essential (primary) hypertension: Secondary | ICD-10-CM | POA: Diagnosis present

## 2022-01-15 NOTE — Patient Instructions (Signed)
It was great seeing you today!  Your blood pressures are still little elevated here in the office but your blood pressure log does not show these elevations.  I would like to repeat the ambulatory blood pressure monitoring and if you decide you are willing to do this please let me know and I will order it.  I do not need to add any medications at this time because it looks like her blood pressures are well enough controlled at home.  Please be sure to follow-up with your kidney doctors and if you have any questions or concerns please call our clinic.  Hope you have a wonderful day!

## 2022-01-15 NOTE — Progress Notes (Unsigned)
    SUBJECTIVE:   CHIEF COMPLAINT / HPI:   Hypertension follow-up Patient presents today for blood pressure check.  Her pressure was elevated at the previous visit so we will need to keep a log of her blood pressures and return for follow-up.  She is currently not on any antihypertensive medications.  Reports that her blood pressures have not been well controlled at home and she does not know what is going on with her blood pressures here.  She brings his blood pressure log with lowest blood pressure of 100/70.  Highest blood pressure 140/80.  Denies any symptoms of hypertension such as chest pain, shortness of breath, headaches, blurred vision.  OBJECTIVE:   BP (!) 162/80   Pulse 64   Ht 5\' 1"  (1.549 m)   Wt 160 lb 9.6 oz (72.8 kg)   SpO2 97%   BMI 30.35 kg/m   General: Pleasant 61 year old female in no acute distress Cardiac: Regular rate and rhythm Respiratory normal for breathing, lungs clear to auscultation Abdomen: Soft and nontender MSK: No gross abnormalities  ASSESSMENT/PLAN:   Essential hypertension Patient's blood pressure elevated today.  Currently prescribed valsartan-hydrochlorothiazide but is not taking this medication at this time.  Blood pressure log showing normal blood pressures.  Discussed ambulatory blood pressure monitoring with patient and she is hesitant initially but finally agrees to it.  We will send message to pharmacy team to schedule ambulatory blood pressure monitoring and adjust medications as needed.     Gifford Shave, MD [place]

## 2022-01-16 ENCOUNTER — Encounter: Payer: Medicaid Other | Admitting: Podiatry

## 2022-01-16 NOTE — Assessment & Plan Note (Addendum)
Patient's blood pressure elevated today.  Currently prescribed valsartan-hydrochlorothiazide but is not taking this medication at this time.  Blood pressure log showing normal blood pressures.  Discussed ambulatory blood pressure monitoring with patient and she is hesitant initially but finally agrees to it.  We will send message to pharmacy team to schedule ambulatory blood pressure monitoring and adjust medications as needed.

## 2022-01-21 NOTE — Telephone Encounter (Signed)
Patient called again stated they still have not received a referral. Please advise.  Thanks!

## 2022-01-21 NOTE — Telephone Encounter (Signed)
LM for patient letting her know that referral has been received by specialist but it takes a few days once they receive the notes for it to be entered into their system.  They will review notes and reach out to patient in the next couple of weeks for an appointment. Ephram Kornegay,CMA

## 2022-01-30 ENCOUNTER — Encounter: Payer: Medicaid Other | Admitting: Podiatry

## 2022-02-01 ENCOUNTER — Telehealth: Payer: Self-pay | Admitting: Pharmacist

## 2022-02-01 NOTE — Telephone Encounter (Signed)
Noted and agreed thank you Dr Valentina Lucks.

## 2022-02-01 NOTE — Telephone Encounter (Signed)
Contacted patient at the request of PCP to set-up ambulatory blood pressure monitoring.   Patient reports that she is not interested in having this test repeated.   (Completed amb BP monitoring in 12/2020).   I verbalized that I understood she was not interested and that I would share with PCP.  Reassess blood pressure in-office at next visit.

## 2022-02-01 NOTE — Telephone Encounter (Signed)
-----   Message from [facility] sent at 01/22/2022 11:09 AM EDT ----- Regarding: RE: Please schedule for Amb BP monitoring Called patient to schedule, LVM to call back. ----- Message ----- From: Leavy Cella, RPH-CPP Sent: 01/21/2022   9:26 AM EDT To: [place] [facility] Subject: Please schedule for Amb BP monitoring          Please contact and schedule for amb BP monitoring.   Remember to check that she is only patient scheduled for the day.  ----- Message ----- From: Gifford Shave, MD Sent: 01/16/2022   9:25 PM EDT To: Leavy Cella, RPH-CPP  Hey Dr Valentina Lucks, this patient says that she has had ambulatory blood pressure monitoring in the past with normal blood pressures.  Her blood pressures are significantly elevated all of her doctors visits but her blood pressure logs show normal pressures.  Could you get her set up with a repeat ambulatory blood pressure monitoring and we can start her on meds if needed.  Thanks!

## 2022-04-10 ENCOUNTER — Other Ambulatory Visit: Payer: Self-pay | Admitting: Student

## 2022-04-10 DIAGNOSIS — Z1231 Encounter for screening mammogram for malignant neoplasm of breast: Secondary | ICD-10-CM

## 2022-04-26 ENCOUNTER — Ambulatory Visit (INDEPENDENT_AMBULATORY_CARE_PROVIDER_SITE_OTHER): Payer: Medicaid Other | Admitting: Student

## 2022-04-26 ENCOUNTER — Other Ambulatory Visit (HOSPITAL_COMMUNITY)
Admission: RE | Admit: 2022-04-26 | Discharge: 2022-04-26 | Disposition: A | Discharge status: Home / Self Care | Payer: Medicaid Other | Source: Ambulatory Visit | Attending: Family Medicine | Admitting: Family Medicine

## 2022-04-26 ENCOUNTER — Encounter: Payer: Self-pay | Admitting: Student

## 2022-04-26 VITALS — BP 159/74 | HR 66 | Ht 61.0 in | Wt 159.2 lb

## 2022-04-26 DIAGNOSIS — Z124 Encounter for screening for malignant neoplasm of cervix: Secondary | ICD-10-CM | POA: Insufficient documentation

## 2022-04-26 DIAGNOSIS — Z794 Long term (current) use of insulin: Secondary | ICD-10-CM

## 2022-04-26 DIAGNOSIS — Z113 Encounter for screening for infections with a predominantly sexual mode of transmission: Secondary | ICD-10-CM | POA: Diagnosis not present

## 2022-04-26 DIAGNOSIS — E1100 Type 2 diabetes mellitus with hyperosmolarity without nonketotic hyperglycemic-hyperosmolar coma (NKHHC): Secondary | ICD-10-CM | POA: Diagnosis present

## 2022-04-26 DIAGNOSIS — I10 Essential (primary) hypertension: Secondary | ICD-10-CM

## 2022-04-26 LAB — POCT GLYCOSYLATED HEMOGLOBIN (HGB A1C): HbA1c, POC (controlled diabetic range): 6 % (ref 0.0–7.0)

## 2022-04-26 MED ORDER — LANTUS SOLOSTAR 100 UNIT/ML ~~LOC~~ SOPN: 15.0000 [IU] | PEN_INJECTOR | Freq: Every day | SUBCUTANEOUS | 3 refills | Status: DC

## 2022-04-26 NOTE — Assessment & Plan Note (Signed)
Performed Pap smear with HPV testing.  Next Pap will be due 04/2027 if resulted normal. - Follow-up Pap results

## 2022-04-26 NOTE — Progress Notes (Cosign Needed Addendum)
    SUBJECTIVE:   CHIEF COMPLAINT / HPI:   Kayla Gregory is a 61 y.o. female presenting for follow up for her hypertension, diabetes, and for her pap smear.    Blood pressure: She was seen by nephrology 02/28/2022 and was started on clonidine 0.1 mg twice daily for her blood pressure management.   She reports her blood pressure at home is stable.  Diabetes: A1c in the office 6.0 which is stable from previous A1c's.  He is doing well on 26 units of Lantus daily.  STI screening: Patient reports a new sexual partner with unprotected intercourse.  She would like to be screened for STIs.  She denies new vaginal discharge, changes in urination, or foul-smelling discharge.  PERTINENT  PMH / PSH:  Stable angina: ASA GERD: 40 mg Protonix Diabetes with peripheral neuropathy: 26 units Lantus daily, 300 mg gabapentin 3 times daily CKD stage IV: Not currently showing uremic symptoms, nephrology educated patient on dialysis.  OBJECTIVE:   BP (!) 159/74   Pulse 66   Ht 5\' 1"  (1.549 m)   Wt 159 lb 3.2 oz (72.2 kg)   SpO2 99%   BMI 30.08 kg/m   Well-appearing, no acute distress Cardio: Regular rate, regular rhythm, no murmur Pulm: Clear, no wheezing, no crackles.  No increased work of breathing Extremities: No peripheral edema  GU Exam:  External exam: Normal-appearing female external genitalia.  Vaginal exam notable for normal vaginal mucosa.  Cervix without discharge or obvious lesion.  No pain with examination.  Chaperoned examine, Dr. Dorris Singh.  ASSESSMENT/PLAN:   Essential hypertension Blood pressure elevated 159/74 in the office.  Patient has had a previous 24-hour blood pressure monitor which resulted as normal range pressures. - Continue Diovan and 0.1 mg clonidine  DM (diabetes mellitus) (HCC) A1c is 6.0.  Patient reports fasting morning blood sugars in the high 90s.  She currently takes 26 units of Lantus. - Decrease insulin to 15 units daily - Recheck A1c in 3  months  Routine screening for STI (sexually transmitted infection) STI testing for new sexual partner. - Ordered GC chlamydia, BV, trichomoniasis swab - Ordered RPR and HIV blood work  Pap smear for cervical cancer screening Performed Pap smear with HPV testing.  Next Pap will be due 04/2027 if resulted normal. - Follow-up Pap results    Kayla Gregory, Kayla Gregory

## 2022-04-26 NOTE — Patient Instructions (Addendum)
It was great to see you today! Thank you for choosing [facility] for your primary care.   Today we addressed: Your diabetes.  Your A1c is 6.0 which is fantastic.  I would like to decrease the amount of insulin you are using to 15 units daily to prevent you from having low sugar episodes. We did your Pap smear in the office today I will get your results and send them to you through [place]. We also tested you for STIs.  I will send your results via MyChart. Continue your blood pressure medicines.   If you haven't already, sign up for My Chart to have easy access to your labs results, and communication with your primary care physician.  We are checking some labs today. If they are abnormal, I will call you. If they are normal, I will send you a MyChart message (if it is active) or a letter in the mail. If you do not hear about your labs in the next 2 weeks, please call the office.   You should return to our clinic Return in about 3 months (around 07/26/2022).  I recommend that you always bring your medications to each appointment as this makes it easy to ensure you are on the correct medications and helps [place] not miss refills when you need them.  Please arrive 15 minutes before your appointment to ensure smooth check in process.  We appreciate your efforts in making this happen.  Please call the clinic at [phone] if your symptoms worsen or you have any concerns.  Thank you for allowing me to participate in your care, Dr. Sabra Heck

## 2022-04-26 NOTE — Assessment & Plan Note (Signed)
Blood pressure elevated 159/74 in the office.  Patient has had a previous 24-hour blood pressure monitor which resulted as normal range pressures. - Continue Diovan and 0.1 mg clonidine

## 2022-04-26 NOTE — Assessment & Plan Note (Addendum)
STI testing for new sexual partner. - Ordered GC chlamydia, BV, trichomoniasis swab - Ordered RPR and HIV blood work

## 2022-04-26 NOTE — Assessment & Plan Note (Signed)
A1c is 6.0.  Patient reports fasting morning blood sugars in the high 90s.  She currently takes 26 units of Lantus. - Decrease insulin to 15 units daily - Recheck A1c in 3 months

## 2022-04-29 ENCOUNTER — Other Ambulatory Visit: Payer: Medicaid Other

## 2022-04-30 LAB — CERVICOVAGINAL ANCILLARY ONLY
Bacterial Vaginitis (gardnerella): POSITIVE — AB
Candida Glabrata: NEGATIVE
Candida Vaginitis: NEGATIVE
Comment: NEGATIVE
Comment: NEGATIVE
Comment: NEGATIVE

## 2022-04-30 LAB — HIV ANTIBODY (ROUTINE TESTING W REFLEX): HIV Screen 4th Generation wRfx: NONREACTIVE

## 2022-04-30 LAB — RPR: RPR Ser Ql: NONREACTIVE

## 2022-05-02 ENCOUNTER — Telehealth: Payer: Self-pay | Admitting: Student

## 2022-05-02 ENCOUNTER — Other Ambulatory Visit: Payer: Medicaid Other

## 2022-05-02 DIAGNOSIS — B9689 Other specified bacterial agents as the cause of diseases classified elsewhere: Secondary | ICD-10-CM

## 2022-05-02 DIAGNOSIS — N184 Chronic kidney disease, stage 4 (severe): Secondary | ICD-10-CM

## 2022-05-02 MED ORDER — METRONIDAZOLE 500 MG PO TABS: 500.0000 mg | ORAL_TABLET | Freq: Two times a day (BID) | ORAL | 0 refills | Status: DC

## 2022-05-02 NOTE — Telephone Encounter (Signed)
I called Ms. Copus and went over her lab results. She was positive for BV. We are still waiting on her cytology results of her pap smear.   Patient reports having increased vaginal discharge, and requested to be treated for BV.  Prescribe metronidazole 500 mg BID for 7 days, no renal dose adjustment, CrCl >10.  Patient would also like her kidneys checked. I sent in an ambulatory BMP. She will get this either today or tomorrow.   [name], DO 05/02/22 1:09 PM

## 2022-05-02 NOTE — Telephone Encounter (Signed)
Patient has called 3 times this week trying to get in touch with a nurse for her results. Patient is on the phone now, wanting doctor to call her to discuss results. She is really wanting to speak to the doctor.   Please advise and call patient.  Thanks!

## 2022-05-03 ENCOUNTER — Telehealth: Payer: Self-pay | Admitting: Student

## 2022-05-03 LAB — BASIC METABOLIC PANEL
BUN/Creatinine Ratio: 13 (ref 12–28)
BUN: 59 mg/dL — ABNORMAL HIGH (ref 8–27)
CO2: 23 mmol/L (ref 20–29)
Calcium: 9.1 mg/dL (ref 8.7–10.3)
Chloride: 101 mmol/L (ref 96–106)
Creatinine, Ser: 4.53 mg/dL — ABNORMAL HIGH (ref 0.57–1.00)
Glucose: 221 mg/dL — ABNORMAL HIGH (ref 70–99)
Potassium: 4.7 mmol/L (ref 3.5–5.2)
Sodium: 139 mmol/L (ref 134–144)
eGFR: 10 mL/min/{1.73_m2} — ABNORMAL LOW (ref >59)

## 2022-05-03 NOTE — Telephone Encounter (Signed)
Spoke with [name] about her BMP results.  Creatinine 4.53 GFR 10  She is following up with her nephrologist on 9/22.   [name], DO 05/03/22 8:32 AM

## 2022-05-06 ENCOUNTER — Ambulatory Visit: Payer: Medicaid Other | Admitting: Podiatry

## 2022-05-06 LAB — CYTOLOGY - PAP
Chlamydia: NEGATIVE
Comment: NEGATIVE
Comment: NEGATIVE
Comment: NORMAL
Diagnosis: UNDETERMINED — AB
High risk HPV: NEGATIVE
Neisseria Gonorrhea: NEGATIVE

## 2022-05-09 ENCOUNTER — Emergency Department (HOSPITAL_COMMUNITY)
Admission: EM | Admit: 2022-05-09 | Discharge: 2022-05-09 | Disposition: A | Discharge status: Home / Self Care | Payer: Medicaid Other | Attending: Emergency Medicine | Admitting: Emergency Medicine

## 2022-05-09 ENCOUNTER — Emergency Department (HOSPITAL_COMMUNITY): Payer: Medicaid Other

## 2022-05-09 ENCOUNTER — Encounter (HOSPITAL_COMMUNITY): Payer: Self-pay

## 2022-05-09 ENCOUNTER — Other Ambulatory Visit: Payer: Self-pay

## 2022-05-09 DIAGNOSIS — Z794 Long term (current) use of insulin: Secondary | ICD-10-CM | POA: Diagnosis not present

## 2022-05-09 DIAGNOSIS — Z7982 Long term (current) use of aspirin: Secondary | ICD-10-CM | POA: Diagnosis not present

## 2022-05-09 DIAGNOSIS — Z8616 Personal history of COVID-19: Secondary | ICD-10-CM | POA: Insufficient documentation

## 2022-05-09 DIAGNOSIS — M542 Cervicalgia: Secondary | ICD-10-CM | POA: Insufficient documentation

## 2022-05-09 DIAGNOSIS — R001 Bradycardia, unspecified: Secondary | ICD-10-CM | POA: Diagnosis not present

## 2022-05-09 DIAGNOSIS — R059 Cough, unspecified: Secondary | ICD-10-CM | POA: Diagnosis not present

## 2022-05-09 DIAGNOSIS — S161XXA Strain of muscle, fascia and tendon at neck level, initial encounter: Secondary | ICD-10-CM

## 2022-05-09 LAB — TROPONIN I (HIGH SENSITIVITY)
Troponin I (High Sensitivity): 7 ng/L (ref <18)
Troponin I (High Sensitivity): 10 ng/L (ref <18)

## 2022-05-09 LAB — BASIC METABOLIC PANEL
Anion gap: 15 (ref 5–15)
BUN: 68 mg/dL — ABNORMAL HIGH (ref 8–23)
CO2: 20 mmol/L — ABNORMAL LOW (ref 22–32)
Calcium: 9.3 mg/dL (ref 8.9–10.3)
Chloride: 108 mmol/L (ref 98–111)
Creatinine, Ser: 5.04 mg/dL — ABNORMAL HIGH (ref 0.44–1.00)
GFR, Estimated: 9 mL/min — ABNORMAL LOW (ref >60)
Glucose, Bld: 264 mg/dL — ABNORMAL HIGH (ref 70–99)
Potassium: 4.4 mmol/L (ref 3.5–5.1)
Sodium: 143 mmol/L (ref 135–145)

## 2022-05-09 LAB — CBC
HCT: 30.2 % — ABNORMAL LOW (ref 36.0–46.0)
Hemoglobin: 9.9 g/dL — ABNORMAL LOW (ref 12.0–15.0)
MCH: 30.8 pg (ref 26.0–34.0)
MCHC: 32.8 g/dL (ref 30.0–36.0)
MCV: 94.1 fL (ref 80.0–100.0)
Platelets: 202 10*3/uL (ref 150–400)
RBC: 3.21 MIL/uL — ABNORMAL LOW (ref 3.87–5.11)
RDW: 12.5 % (ref 11.5–15.5)
WBC: 4.8 10*3/uL (ref 4.0–10.5)
nRBC: 0 % (ref 0.0–0.2)

## 2022-05-09 MED ORDER — METHOCARBAMOL 500 MG PO TABS: 500.0000 mg | ORAL_TABLET | Freq: Two times a day (BID) | ORAL | 0 refills | Status: DC

## 2022-05-09 NOTE — ED Triage Notes (Signed)
Reports when she was coughing the other days she felt a tightness in her neck.  No difficulty swallowing or breathing.

## 2022-05-09 NOTE — ED Provider Triage Note (Signed)
Emergency Medicine Provider Triage Evaluation Note  Kayla Gregory , a 61 y.o. female  was evaluated in triage.  Pt complains of neck pain onset several days.  Notes that she was coughing and felt her tightness to her neck on the left side.  Has a history of diabetes.  Patient has associated left-sided chest pain as well as left shoulder pain.  Denies shortness of breath, history of MI/stents.  Denies sleeping incorrectly, denies recent injury, trauma, fall.  Review of Systems  Positive:  Negative:   Physical Exam  BP (!) 159/75 (BP Location: Right Arm)   Pulse 63   Temp 98.9 F (37.2 C) (Oral)   Resp 18   Ht 5\' 1"  (1.549 m)   Wt 72.1 kg   SpO2 96%   BMI 30.04 kg/m  Gen:   Awake, no distress   Resp:  Normal effort  MSK:   Moves extremities without difficulty  Other:  Reproducible tenderness to palpation noted to left chest wall  Medical Decision Making  Medically screening exam initiated at 3:57 PM.  Appropriate orders placed.  Kayla Gregory was informed that the remainder of the evaluation will be completed by another provider, this initial triage assessment does not replace that evaluation, and the importance of remaining in the ED until their evaluation is complete.  Work-up initiated   Alexius Ellington A, PA-C 05/09/22 1558

## 2022-05-09 NOTE — ED Provider Notes (Signed)
[facility] EMERGENCY DEPARTMENT Provider Note   CSN: [id number] Arrival date & time: 05/09/22  1412     History  Chief Complaint  Patient presents with   Neck Pain    Kayla Gregory is a 61 y.o. female.  61 year old female presents with left-sided neck pain which began several days ago after she coughed.  Pain is characterized as sharp and does react down to her left chest.  Is also worse with movement.  No associated coronary symptoms.  No weakness to her left hand.  Pain better with remaining still no treatment use prior to arrival.       Home Medications Prior to Admission medications   Medication Sig Start Date End Date Taking? Authorizing Provider  Accu-Chek Softclix Lancets lancets CHECK GLUCOSE ONCE DAILY IN THE MORNING 07/31/20   Benay Pike, MD  aspirin 81 MG chewable tablet 1 tablet    [provider]  atorvastatin (LIPITOR) 40 MG tablet Take 1 tablet (40 mg total) by mouth daily. TAKE 1 TABLET BY MOUTH DAILY 09/24/21   Concepcion Living, MD  Blood Glucose Monitoring Suppl (ACCU-CHEK AVIVA PLUS) w/Device KIT 1 application by Does not apply route 4 (four) times daily -  with meals and at bedtime. 11/23/18   Diallo, Earna Coder, MD  calcitRIOL (ROCALTROL) 0.25 MCG capsule Take 0.25 mcg by mouth daily.    [provider]  COVID-19 mRNA bivalent vaccine, Moderna, (MODERNA COVID-19 BIVAL BOOSTER) 50 MCG/0.5ML injection Inject into the muscle. 06/21/21   Carlyle Basques, MD  FLUoxetine (PROZAC) 20 MG capsule Take 1 capsule (20 mg total) by mouth daily. 06/22/18   Diallo, Earna Coder, MD  gabapentin (NEURONTIN) 300 MG capsule Take 300 mg by mouth 3 (three) times daily.    [provider]  glucose blood (ACCU-CHEK AVIVA PLUS) test strip Use to check blood glucose up to four times daily 07/20/21   Concepcion Living, MD  influenza vac split quadrivalent PF (FLUARIX) 0.5 ML injection Inject into the muscle. 06/21/21   Carlyle Basques, MD   insulin glargine (LANTUS SOLOSTAR) 100 UNIT/ML Solostar Pen Inject 15 Units into the skin daily. 04/26/22   [name], DO  Insulin Pen Needle (B-D ULTRAFINE III SHORT PEN) 31G X 8 MM MISC USE DAILY AS DIRECTED 12/18/20   Benay Pike, MD  Lancet Device MISC Check blood glucose once a day in the AM. 04/13/20   Benay Pike, MD  Lancet Devices [facility]) lancets Use as instructed 11/22/16   Marjie Skiff, MD  loratadine (CLARITIN) 10 MG tablet Take 1 tablet (10 mg total) by mouth daily. 09/26/21   Cresenzo, Angelyn Punt, MD  metroNIDAZOLE (FLAGYL) 500 MG tablet Take 1 tablet (500 mg total) by mouth 2 (two) times daily. 05/02/22   [name], DO  pantoprazole (PROTONIX) 40 MG tablet Take 1 tablet (40 mg total) by mouth daily. TAKE 1 TABLET(40 MG) BY MOUTH DAILY 09/24/21   Cresenzo, Angelyn Punt, MD  Propylene Glycol (SYSTANE COMPLETE) 0.6 % SOLN Place 2-3 drops into both eyes daily as needed (for dry eyes).     [provider]  tobramycin (TOBREX) 0.3 % ophthalmic solution Place 1 drop into both eyes every 6 (six) hours. 03/26/19   [provider]  triamcinolone cream (KENALOG) 0.1 % Apply 1 application topically 2 (two) times daily. 05/11/20   Benay Pike, MD  valsartan-hydrochlorothiazide (DIOVAN-HCT) 160-25 MG tablet TAKE 1 TABLET BY MOUTH DAILY 12/25/21   Caron Presume Angelyn Punt, MD  vitamin B-12 (CYANOCOBALAMIN) 1000 MCG tablet Take 1,000 mcg by mouth daily.    [provider]  ziprasidone (GEODON) 20 MG capsule Take 1 capsule (20 mg total) by mouth daily. 06/22/18   Marjie Skiff, MD      Allergies    Metformin and related    Review of Systems   Review of Systems  All other systems reviewed and are negative.   Physical Exam Updated Vital Signs BP (!) 194/72   Pulse (!) 50   Temp 98.9 F (37.2 C) (Oral)   Resp 16   Ht 1.549 m (5' 1" )   Wt 72.1 kg   SpO2 98%   BMI 30.04 kg/m  Physical Exam Vitals and nursing note reviewed.  Constitutional:       General: She is not in acute distress.    Appearance: Normal appearance. She is well-developed. She is not toxic-appearing.  HENT:     Head: Normocephalic and atraumatic.  Eyes:     General: Lids are normal.     Conjunctiva/sclera: Conjunctivae normal.     Pupils: Pupils are equal, round, and reactive to light.  Neck:     Thyroid: No thyroid mass.     Trachea: No tracheal deviation.   Cardiovascular:     Rate and Rhythm: Normal rate and regular rhythm.     Heart sounds: Normal heart sounds. No murmur heard.    No gallop.  Pulmonary:     Effort: Pulmonary effort is normal. No respiratory distress.     Breath sounds: Normal breath sounds. No stridor. No decreased breath sounds, wheezing, rhonchi or rales.  Abdominal:     General: There is no distension.     Palpations: Abdomen is soft.     Tenderness: There is no abdominal tenderness. There is no rebound.  Musculoskeletal:        General: No tenderness. Normal range of motion.     Cervical back: Normal range of motion and neck supple.  Skin:    General: Skin is warm and dry.     Findings: No abrasion or rash.  Neurological:     Mental Status: She is alert and oriented to person, place, and time. Mental status is at baseline.     GCS: GCS eye subscore is 4. GCS verbal subscore is 5. GCS motor subscore is 6.     Cranial Nerves: No cranial nerve deficit.     Sensory: No sensory deficit.     Motor: Motor function is intact.  Psychiatric:        Attention and Perception: Attention normal.        Speech: Speech normal.        Behavior: Behavior normal.     ED Results / Procedures / Treatments   Labs (all labs ordered are listed, but only abnormal results are displayed) Labs Reviewed  BASIC METABOLIC PANEL - Abnormal; Notable for the following components:      Result Value   CO2 20 (*)    Glucose, Bld 264 (*)    BUN 68 (*)    Creatinine, Ser 5.04 (*)    GFR, Estimated 9 (*)    All other components within normal limits   CBC - Abnormal; Notable for the following components:   RBC 3.21 (*)    Hemoglobin 9.9 (*)    HCT 30.2 (*)    All other components within normal limits  TROPONIN I (HIGH SENSITIVITY)  TROPONIN I (HIGH SENSITIVITY)    EKG EKG Interpretation  Date/Time:  Thursday May 09 2022 16:08:16 EDT Ventricular Rate:  57 PR Interval:  130 QRS Duration: 74 QT Interval:  422 QTC Calculation: 410 R Axis:   14 Text Interpretation: Sinus bradycardia Otherwise normal ECG When compared with ECG of 23-Jun-2021 11:50, PREVIOUS ECG IS PRESENT No significant change since last tracing Confirmed by Lacretia Leigh ([id number]) on 05/09/2022 9:41:43 PM  Radiology DG Chest 1 View  Result Date: 05/09/2022 CLINICAL DATA:  Coughing, sharp and throbbing pain EXAM: CHEST  1 VIEW COMPARISON:  Radiograph 06/23/2021 FINDINGS: Unchanged cardiomediastinal silhouette. There is no focal airspace consolidation. There is no pleural effusion. No pneumothorax. There is no acute osseous abnormality. IMPRESSION: No evidence of acute cardiopulmonary disease. Electronically Signed   By: Maurine Simmering M.D.   On: 05/09/2022 16:42   DG Cervical Spine Complete  Result Date: 05/09/2022 CLINICAL DATA:  Cough, left-sided neck pain EXAM: CERVICAL SPINE - COMPLETE 4+ VIEW COMPARISON:  None Available. FINDINGS: Frontal, bilateral oblique, lateral views of the cervical spine are obtained. There is reversal of cervical lordosis with kyphosis centered at the C3-4 level. Otherwise alignment is anatomic. Congenital C2-3 fusion. Severe multilevel facet hypertrophy from C3-4 through C6-7. Right greater than left neural foraminal narrowing from C3-4 through C6-7. No acute fracture. Prevertebral soft tissues are unremarkable. Lung apices are clear. IMPRESSION: 1. Reversal cervical lordosis due to underlying degenerative change. 2. Severe multilevel cervical spondylosis. 3. No evidence of acute fracture. Electronically Signed   By: Randa Ngo M.D.   On:  05/09/2022 16:42    Procedures Procedures    Medications Ordered in ED Medications - No data to display  ED Course/ Medical Decision Making/ A&P                           Medical Decision Making  Patient is EKG per my interpretation shows no signs of ischemic changes.  Chest x-ray per interpretation is negative.  Patient's C-spine series per my interpretation is negative.  Low suspicion for ACS.  Suspect musculoskeletal etiology.  She has no focal neurological findings.  Considered MRI but do not feel she needs this at this time.  Reproducible pain at her left neck.  Will place on muscle relaxants and give return precautions        Final Clinical Impression(s) / ED Diagnoses Final diagnoses:  None    Rx / DC Orders ED Discharge Orders     None         Lacretia Leigh, MD 05/09/22 2150

## 2022-05-10 ENCOUNTER — Telehealth: Payer: Self-pay

## 2022-05-10 NOTE — Patient Outreach (Signed)
  Care Coordination TOC Note Transition Care Management Follow-up Telephone Call Date of discharge and from where: Kayla Gregory 05/09/22 How have you been since you were released from the hospital? "I am doing better, am going to do the exercises the doctor showed me". Any questions or concerns? No  Items Reviewed: Did the pt receive and understand the discharge instructions provided? Yes  Medications obtained and verified? Yes  Other? Yes  Any new allergies since your discharge? No  Dietary orders reviewed? No Do you have support at home? Yes   Home Care and Equipment/Supplies: Were home health services ordered? no If so, what is the name of the agency? N/A  Has the agency set up a time to come to the patient's home? not applicable Were any new equipment or medical supplies ordered?  No What is the name of the medical supply agency? N/A Were you able to get the supplies/equipment? not applicable Do you have any questions related to the use of the equipment or supplies? No  Functional Questionnaire: (I = Independent and D = Dependent) ADLs: I  Bathing/Dressing- I  Meal Prep- I  Eating- I  Maintaining continence- I  Transferring/Ambulation- I  Managing Meds- I  Follow up appointments reviewed:  PCP Hospital f/u appt confirmed? No   Specialist Hospital f/u appt confirmed? No   Are transportation arrangements needed? No  If their condition worsens, is the pt aware to call PCP or go to the Emergency Dept.? Yes Was the patient provided with contact information for the PCP's office or ED? Yes Was to pt encouraged to call back with questions or concerns? Yes  SDOH assessments and interventions completed:   Yes  Care Coordination Interventions Activated:  Yes   Care Coordination Interventions:  No Care Coordination interventions needed at this time.   Encounter Outcome:  Pt. Visit Completed

## 2022-06-06 ENCOUNTER — Ambulatory Visit
Admission: RE | Admit: 2022-06-06 | Discharge: 2022-06-06 | Disposition: A | Discharge status: Home / Self Care | Payer: Medicaid Other | Source: Ambulatory Visit | Attending: Podiatry | Admitting: Podiatry

## 2022-06-06 DIAGNOSIS — Z1231 Encounter for screening mammogram for malignant neoplasm of breast: Secondary | ICD-10-CM

## 2022-06-07 ENCOUNTER — Encounter: Payer: Self-pay | Admitting: Student

## 2022-06-07 NOTE — Progress Notes (Signed)
Will forward letter to admin team to send to patient.   [name], DO [facility], PGY-1 06/07/22 11:23 AM

## 2022-06-18 ENCOUNTER — Other Ambulatory Visit: Payer: Self-pay | Admitting: Family Medicine

## 2022-06-18 DIAGNOSIS — I10 Essential (primary) hypertension: Secondary | ICD-10-CM

## 2022-07-29 ENCOUNTER — Ambulatory Visit (INDEPENDENT_AMBULATORY_CARE_PROVIDER_SITE_OTHER): Payer: Medicaid Other | Admitting: Student

## 2022-07-29 ENCOUNTER — Encounter: Payer: Self-pay | Admitting: Student

## 2022-07-29 VITALS — BP 122/78 | HR 60 | Ht 61.0 in | Wt 145.0 lb

## 2022-07-29 DIAGNOSIS — E1100 Type 2 diabetes mellitus with hyperosmolarity without nonketotic hyperglycemic-hyperosmolar coma (NKHHC): Secondary | ICD-10-CM | POA: Diagnosis present

## 2022-07-29 DIAGNOSIS — N184 Chronic kidney disease, stage 4 (severe): Secondary | ICD-10-CM | POA: Diagnosis not present

## 2022-07-29 DIAGNOSIS — Z794 Long term (current) use of insulin: Secondary | ICD-10-CM | POA: Diagnosis not present

## 2022-07-29 LAB — POCT GLYCOSYLATED HEMOGLOBIN (HGB A1C): HbA1c, POC (controlled diabetic range): 6.9 % (ref 0.0–7.0)

## 2022-07-29 MED ORDER — BD PEN NEEDLE SHORT U/F 31G X 8 MM MISC: 1 refills | Status: DC

## 2022-07-29 MED ORDER — ACCU-CHEK AVIVA PLUS VI STRP: ORAL_STRIP | 11 refills | Status: DC

## 2022-07-29 MED ORDER — ACCU-CHEK AVIVA PLUS W/DEVICE KIT: 1.0000 "application " | PACK | Freq: Three times a day (TID) | 0 refills | Status: DC

## 2022-07-29 MED ORDER — LANTUS SOLOSTAR 100 UNIT/ML ~~LOC~~ SOPN: 10.0000 [IU] | PEN_INJECTOR | Freq: Every day | SUBCUTANEOUS | 3 refills | Status: DC

## 2022-07-29 MED ORDER — ACCU-CHEK SOFTCLIX LANCETS MISC: 3 refills | Status: DC

## 2022-07-29 MED ORDER — BLOOD PRESSURE CUFF MISC: 1.0000 | Freq: Every day | 0 refills | Status: AC

## 2022-07-29 NOTE — Assessment & Plan Note (Signed)
Decrease Lantus to 10 units daily; A1C 6.9, most likely increased from her skipping insulin doses from low sugar readings.  - refilled lancets and glucometer  - recheck A1C in 3 months

## 2022-07-29 NOTE — Progress Notes (Signed)
    SUBJECTIVE:   CHIEF COMPLAINT / HPI:   Kayla Gregory is a 61 y.o. female  presenting for follow up for her diabetes and chronic kidney disease.   T2DM:  Meds: Lantus 15 units daily - decreased from last visit; she will sometimes skip her dose of Latus 3/7 days per week from low sugar readings  Home sugars: low readings <90 in the morning, needs a new glucometer  Eye Exam: due Last A1C: 6.0  CKD:  Follows with nephrology, opting for peritoneal dialysis. She has been watching what she eats and has intentionally lost 14 lbs. She is very excited about this achievement and is feeling better with the weight loss.   PERTINENT  PMH / PSH:  Stable angina: ASA GERD: 40 mg Protonix  OBJECTIVE:   BP 122/78   Pulse 60   Ht 5\' 1"  (1.549 m)   Wt 65.8 kg   SpO2 100%   BMI 27.40 kg/m   Well-appearing, no acute distress; ambulates without assistance  Cardio: Regular rate, regular rhythm, no murmurs on exam. Pulm: Clear, no wheezing, no crackles. No increased work of breathing Abdominal: bowel sounds present, soft, non-tender, non-distended Extremities: no peripheral edema    ASSESSMENT/PLAN:   DM (diabetes mellitus) (HCC) Decrease Lantus to 10 units daily; A1C 6.9, most likely increased from her skipping insulin doses from low sugar readings.  - refilled lancets and glucometer  - recheck A1C in 3 months   CKD (chronic kidney disease), stage IV (HCC) Stable, planning peritoneal dialysis. Has an appointment with her kidney doctor.  - checking CBC/CMP today    Due for Shingles shot, counseled patient today about receiving.   [name], [place]

## 2022-07-29 NOTE — Assessment & Plan Note (Signed)
Stable, planning peritoneal dialysis. Has an appointment with her kidney doctor.  - checking CBC/CMP today

## 2022-07-29 NOTE — Patient Instructions (Addendum)
It was great to see you today!   Today we addressed: Your diabetes, your A1C increased to 6.9. This is okay, it's probably due to not taking your insulin because of the low blood sugar readings you were getting.  Let's drop your Lantus to 10 units daily and follow up in 3 months. If you notice your sugars are still low in the mornings call the office and let me know, we can continue dropping your Lantus.  Be sure to go by and get your Flu and Shingles vaccines at the local drug store.   You should return to our clinic Return in about 3 months (around 10/28/2022) for A1C check .  Please arrive 15 minutes before your appointment to ensure smooth check in process.    Please call the clinic at [phone] if your symptoms worsen or you have any concerns.  Thank you for allowing me to participate in your care, Dr. [name] [facility]

## 2022-07-30 ENCOUNTER — Telehealth: Payer: Self-pay | Admitting: Student

## 2022-07-30 DIAGNOSIS — K219 Gastro-esophageal reflux disease without esophagitis: Secondary | ICD-10-CM

## 2022-07-30 LAB — CBC
Hematocrit: 33.1 % — ABNORMAL LOW (ref 34.0–46.6)
Hemoglobin: 11.4 g/dL (ref 11.1–15.9)
MCH: 31.4 pg (ref 26.6–33.0)
MCHC: 34.4 g/dL (ref 31.5–35.7)
MCV: 91 fL (ref 79–97)
Platelets: 204 10*3/uL (ref 150–450)
RBC: 3.63 x10E6/uL — ABNORMAL LOW (ref 3.77–5.28)
RDW: 12.2 % (ref 11.7–15.4)
WBC: 5.4 10*3/uL (ref 3.4–10.8)

## 2022-07-30 LAB — BASIC METABOLIC PANEL
BUN/Creatinine Ratio: 12 (ref 12–28)
BUN: 51 mg/dL — ABNORMAL HIGH (ref 8–27)
CO2: 25 mmol/L (ref 20–29)
Calcium: 10.5 mg/dL — ABNORMAL HIGH (ref 8.7–10.3)
Chloride: 101 mmol/L (ref 96–106)
Creatinine, Ser: 4.1 mg/dL — ABNORMAL HIGH (ref 0.57–1.00)
Glucose: 180 mg/dL — ABNORMAL HIGH (ref 70–99)
Potassium: 4.8 mmol/L (ref 3.5–5.2)
Sodium: 141 mmol/L (ref 134–144)
eGFR: 12 mL/min/{1.73_m2} — ABNORMAL LOW (ref >59)

## 2022-07-30 MED ORDER — PANTOPRAZOLE SODIUM 40 MG PO TBEC: 40.0000 mg | DELAYED_RELEASE_TABLET | Freq: Every day | ORAL | 3 refills | Status: DC

## 2022-07-30 NOTE — Telephone Encounter (Signed)
Spoke to patient about recent lab results. Her kidney function remains stable. She is following with nephrology outpatient. I sent in an order for Protonix as requested.   At our appointment her blood sugar accu-check had stopped working, I sent in a new order to the pharmacy but they were unable to fill it. The pharmacy told her she would need to reach out to the manufacturer to get another glucometer.   [name], DO [facility], PGY-1 07/30/22 3:20 PM

## 2022-08-06 ENCOUNTER — Other Ambulatory Visit: Payer: Self-pay | Admitting: Student

## 2022-08-06 DIAGNOSIS — K219 Gastro-esophageal reflux disease without esophagitis: Secondary | ICD-10-CM

## 2022-08-07 ENCOUNTER — Encounter: Payer: Self-pay | Admitting: Podiatry

## 2022-08-07 ENCOUNTER — Ambulatory Visit (INDEPENDENT_AMBULATORY_CARE_PROVIDER_SITE_OTHER): Payer: Medicaid Other | Admitting: Podiatry

## 2022-08-07 DIAGNOSIS — M79676 Pain in unspecified toe(s): Secondary | ICD-10-CM | POA: Diagnosis not present

## 2022-08-07 DIAGNOSIS — E0843 Diabetes mellitus due to underlying condition with diabetic autonomic (poly)neuropathy: Secondary | ICD-10-CM | POA: Diagnosis not present

## 2022-08-07 DIAGNOSIS — L84 Corns and callosities: Secondary | ICD-10-CM | POA: Diagnosis not present

## 2022-08-07 DIAGNOSIS — E119 Type 2 diabetes mellitus without complications: Secondary | ICD-10-CM

## 2022-08-07 DIAGNOSIS — M2011 Hallux valgus (acquired), right foot: Secondary | ICD-10-CM

## 2022-08-07 DIAGNOSIS — B351 Tinea unguium: Secondary | ICD-10-CM | POA: Diagnosis not present

## 2022-08-08 NOTE — Progress Notes (Signed)
ANNUAL DIABETIC FOOT EXAM  Subjective: Kayla Gregory presents today for annual diabetic foot examination. Patient states she lost her mother.  Chief Complaint  Patient presents with   Routine diabetic foot care     Blood pressure medication has not been taken today, PCP was seen 2 weeks ago, A1C is 9, BS is 98   Patient confirms h/o diabetes.  Patient denies any h/o foot wounds.  Patient has been diagnosed with neuropathy and it is managed with gabapentin.  Risk factors: diabetes, diabetic neuropathy, CKD, hyperlipidemia, h/o tobacco use in remission.  [name], DO is patient's PCP.  Past Medical History:  Diagnosis Date   Arthritis    Diabetes mellitus without complication ([place]) 4010   Environmental and seasonal allergies    GERD (gastroesophageal reflux disease)    Hyperlipidemia associated with type 2 diabetes mellitus ([place])    Hypertension associated with diabetes ([place])    Myogenic ptosis of left eyelid    Schizophrenia [facility])    Patient Active Problem List   Diagnosis Date Noted   Bartholin cyst 12/21/2021   Family history of malignant neoplasm of gastrointestinal tract 09/26/2020   Personal history of colonic polyps 09/26/2020   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   Asymptomatic microscopic hematuria 04/20/2020   Peripheral neuropathy 10/04/2019   CKD (chronic kidney disease), stage IV (HCC) 04/06/2019   Allergic rhinitis 12/14/2018   Chronic left shoulder pain 12/27/2016   Trigger finger 10/07/2016   Stable angina 12/22/2015   GERD (gastroesophageal reflux disease) 12/22/2015   Chronic hepatitis C ([place]) 12/22/2015   DM (diabetes mellitus) ([place]) 03/31/2014   Essential hypertension 03/31/2014   Past Surgical History:  Procedure Laterality Date   CATARACT EXTRACTION W/ INTRAOCULAR LENS  IMPLANT, BILATERAL     COLONOSCOPY     JOINT REPLACEMENT Left    Hip   PTOSIS REPAIR Left 12/25/2017   Procedure: INTERNAL PTOSIS REPAIR LEFT EYE;  Surgeon: Clista Bernhardt, MD;   Location: [place];  Service: Ophthalmology;  Laterality: Left;   TONSILLECTOMY     Current Outpatient Medications on File Prior to Visit  Medication Sig Dispense Refill   Accu-Chek Softclix Lancets lancets CHECK GLUCOSE ONCE DAILY IN THE MORNING 100 each 3   aspirin 81 MG chewable tablet 1 tablet     atorvastatin (LIPITOR) 40 MG tablet TAKE 1 TABLET(40 MG) BY MOUTH DAILY 90 tablet 3   Blood Glucose Monitoring Suppl (ACCU-CHEK AVIVA PLUS) w/Device KIT 1 application  by Does not apply route 4 (four) times daily -  with meals and at bedtime. 1 kit 0   Blood Pressure Monitoring (BLOOD PRESSURE CUFF) MISC 1 each by Does not apply route daily. 1 each 0   calcitRIOL (ROCALTROL) 0.25 MCG capsule Take 0.25 mcg by mouth daily.     gabapentin (NEURONTIN) 300 MG capsule Take 100 mg by mouth 2 (two) times daily.     glucose blood (ACCU-CHEK AVIVA PLUS) test strip Use to check blood glucose up to four times daily 300 strip 11   influenza vac split quadrivalent PF (FLUARIX) 0.5 ML injection Inject into the muscle. 0.5 mL 0   insulin glargine (LANTUS SOLOSTAR) 100 UNIT/ML Solostar Pen Inject 10 Units into the skin daily. 15 mL 3   Insulin Pen Needle (B-D ULTRAFINE III SHORT PEN) 31G X 8 MM MISC USE DAILY AS DIRECTED 100 each 1   Lancet Device MISC Check blood glucose once a day in the AM. 1 each 0   Lancet Devices (ACCU-CHEK SOFTCLIX)  lancets Use as instructed 1 each 2   loratadine (CLARITIN) 10 MG tablet TAKE 1 TABLET(10 MG) BY MOUTH DAILY 30 tablet 11   pantoprazole (PROTONIX) 40 MG tablet TAKE 1 TABLET(40 MG) BY MOUTH DAILY 90 tablet 3   Propylene Glycol (SYSTANE COMPLETE) 0.6 % SOLN Place 2-3 drops into both eyes daily as needed (for dry eyes).      tobramycin (TOBREX) 0.3 % ophthalmic solution Place 1 drop into both eyes every 6 (six) hours.     valsartan-hydrochlorothiazide (DIOVAN-HCT) 160-25 MG tablet TAKE 1 TABLET BY MOUTH DAILY 30 tablet 11   vitamin B-12 (CYANOCOBALAMIN) 1000 MCG tablet Take 1,000  mcg by mouth daily.     Current Facility-Administered Medications on File Prior to Visit  Medication Dose Route Frequency Provider Last Rate Last Admin   insulin starter kit- pen needles (English) 1 kit  1 kit Other Once Maryellen Pile, MD        Allergies  Allergen Reactions   Metformin And Related Diarrhea and Other (See Comments)    GI symptoms including abdominal pain and diarrhea - Pt not interested in taking again   Omeprazole Other (See Comments) and Rash    Denies airway involvement   Social History   Occupational History   Not on file  Tobacco Use   Smoking status: Former   Smokeless tobacco: Never   Tobacco comments:    stopped smoking cigareetes in 2001  Vaping Use   Vaping Use: Never used  Substance and Sexual Activity   Alcohol use: No    Alcohol/week: 0.0 standard drinks of alcohol    Comment: quit smoking 2001   Drug use: No    Comment: past crack and marijuana quit in 2001   Sexual activity: Not on file   Family History  Problem Relation Age of Onset   Heart attack Sister 67   Heart attack Brother 28   Heart attack Mother 5   Immunization History  Administered Date(s) Administered   Influenza,inj,Quad PF,6+ Mos 09/25/2017, 07/13/2018, 05/17/2019, 05/11/2020, 09/21/2021   Influenza-Unspecified 06/17/2017, 05/19/2022   Moderna Covid-19 Vaccine Bivalent Booster 18yr & up 06/21/2021   Moderna SARS-COV2 Booster Vaccination 12/12/2020   Moderna Sars-Covid-2 Vaccination 09/10/2019, 10/15/2019   Tdap 12/28/2015   Zoster Recombinat (Shingrix) 02/21/2022    Review of Systems: Negative except as noted in the HPI.   Objective: There were no vitals filed for this visit.  Kayla FHAIDEE STOGSDILLis a pleasant 60y.o. female in NAD. AAO X 3.  Vascular Examination: CFT immediate b/l LE. Palpable DP/PT pulses b/l LE. Digital hair present b/l. Skin temperature gradient WNL b/l. No pain with calf compression b/l. No edema noted b/l. No cyanosis or clubbing noted b/l  LE.  Dermatological Examination: Pedal skin warm and supple b/l.  No open wounds b/l. No interdigital macerations. Toenails 1-5 b/l elongated, thickened, discolored with subungual debris. +Tenderness with dorsal palpation of nailplates. Hyperkeratotic lesion(s) noted left great toe.   Neurological Examination: Protective sensation intact 5/5 intact bilaterally with 10g monofilament b/l. Vibratory sensation intact b/l. Proprioception intact bilaterally.  Musculoskeletal Examination: Normal muscle strength 5/5 to all lower extremity muscle groups bilaterally. HAV with bunion deformity noted b/l LE..Marland KitchenNo pain, crepitus or joint limitation noted with ROM b/l LE.  Patient ambulates independently without assistive aids.  Footwear Assessment: Does the patient wear appropriate shoes? Yes. Does the patient need inserts/orthotics? No.  ADA Risk Categorization: Low Risk :  Patient has all of the following: Intact protective sensation No  prior foot ulcer  No severe deformity Pedal pulses present  Assessment: 1. Pain due to onychomycosis of toenail   2. Diabetes mellitus due to underlying condition with diabetic autonomic neuropathy, unspecified whether long term insulin use (HCC)   3. Callus   4. Hallux valgus, right   5. Encounter for diabetic foot exam ([place])     Plan: No orders of the defined types were placed in this encounter.  -Patient was evaluated and treated. All patient's and/or POA's questions/concerns answered on today's visit. -Extended my condolences on the loss of her mother. -Diabetic foot examination performed today. -Continue supportive shoe gear daily. -Mycotic toenails 1-5 bilaterally were debrided in length and girth with sterile nail nippers and dremel without incident. -Callus(es) left great toe pared utilizing sterile scalpel blade without complication or incident. Total number debrided =1. -Patient/POA to call should there be question/concern in the interim. Return  in about 3 months (around 11/06/2022).  Marzetta Board, DPM

## 2022-08-15 ENCOUNTER — Ambulatory Visit (INDEPENDENT_AMBULATORY_CARE_PROVIDER_SITE_OTHER): Payer: Medicaid Other | Admitting: Student

## 2022-08-15 ENCOUNTER — Ambulatory Visit: Payer: Medicaid Other | Admitting: Family Medicine

## 2022-08-15 ENCOUNTER — Encounter: Payer: Self-pay | Admitting: Student

## 2022-08-15 VITALS — BP 125/80 | HR 71 | Temp 98.7°F | Ht 61.0 in | Wt 147.4 lb

## 2022-08-15 DIAGNOSIS — J309 Allergic rhinitis, unspecified: Secondary | ICD-10-CM | POA: Diagnosis not present

## 2022-08-15 MED ORDER — CETIRIZINE HCL 10 MG PO TABS: 10.0000 mg | ORAL_TABLET | Freq: Every day | ORAL | 11 refills | Status: DC | PRN

## 2022-08-15 NOTE — Assessment & Plan Note (Addendum)
Patient complains of months of runny nose, itchy ears, scratch throat and cough. Patient notes that she had a fever on Sunday, but has otherwise been feeling well, and denies any other constitutional symptoms. Ear and throat exam normal, posterior oral pharynx demonstrating significant mucous. Patient report's that she is taking claritin regularly. Given symptoms and timing, most concerned for allergies. Patient would likely benefit from nasal steroid but is against nasal sprays. Patient unlikely to have sinus infection, strep throat, Flu, Covid, or PNA for multiple months with minimal systemic symptoms. Will adjust allergy medicine and offer sinus rinse. Patient requesting referral to ENT to be "drained' if this does not work.  -Switch claritin to zyrtec 10 mg daily -Use sinus rinse 1-2x a day, daily -F/u prn -Consider ENT vs Allergist referral in returns

## 2022-08-15 NOTE — Patient Instructions (Signed)
It was great to see you! Thank you for allowing me to participate in your care!  It sounds like your symptoms are coming from allergies. We will adjust your meds and add a sinus rinse to see if you get some benefit.  Our plans for today:   - Allergies  Stop taking Claritin, start taking Zyrtec  Use sinus rinse 1-2 times a day, daily  A nasal spray would likely make the biggest difference in your symptoms, consider in the future  Take care and seek immediate care sooner if you develop any concerns.   Dr. Holley Bouche, MD Kayla Gregory

## 2022-08-15 NOTE — Progress Notes (Addendum)
  SUBJECTIVE:   CHIEF COMPLAINT / HPI:   Body chills/cough/nose & ears itching  Has been having issues since the summer. Report's runny nose, itchy ears, scratch throat off and on and cough. Had fevers on Sunday and Saturday as high as 101. No belly pain, regular bowel movements, no diarrhea or vomit. Denies any body aches or fatigue.   PERTINENT  PMH / PSH:    OBJECTIVE:  BP 125/80   Pulse 71   Temp 98.7 F (37.1 C)   Ht 5\' 1"  (1.549 m)   Wt 147 lb 6.4 oz (66.9 kg)   SpO2 98%   BMI 27.85 kg/m  Physical Exam Constitutional:      General: She is not in acute distress.    Appearance: Normal appearance. She is not ill-appearing.  HENT:     Right Ear: Tympanic membrane and ear canal normal.     Left Ear: Tympanic membrane and ear canal normal.     Nose: Congestion present. No rhinorrhea.     Mouth/Throat:     Mouth: Mucous membranes are moist.     Pharynx: Oropharynx is clear. No oropharyngeal exudate or posterior oropharyngeal erythema.  Neurological:     Mental Status: She is alert.      ASSESSMENT/PLAN:  Seasonal allergic rhinitis due to pollen Assessment & Plan: Patient complains of months of runny nose, itchy ears, scratch throat and cough. Patient notes that she had a fever on Sunday, but has otherwise been feeling well, and denies any other constitutional symptoms. Ear and throat exam normal, posterior oral pharynx demonstrating significant mucous. Patient report's that she is taking claritin regularly. Given symptoms and timing, most concerned for allergies. Patient would likely benefit from nasal steroid but is against nasal sprays. Patient unlikely to have sinus infection, strep throat, Flu, Covid, or PNA for multiple months with minimal systemic symptoms. Will adjust allergy medicine and offer sinus rinse. Patient requesting referral to ENT to be "drained' if this does not work.  -Switch claritin to zyrtec 10 mg daily -Use sinus rinse 1-2x a day, daily -F/u  prn -Consider ENT vs Allergist referral in returns   Other orders -     Cetirizine HCl; Take 1 tablet (10 mg total) by mouth daily as needed for allergies.  Dispense: 30 tablet; Refill: 11   No follow-ups on file. Holley Bouche, MD 08/15/2022, 5:10 PM PGY-2,

## 2022-08-15 NOTE — Assessment & Plan Note (Signed)
>>  ASSESSMENT AND PLAN FOR ALLERGIC RHINITIS WRITTEN ON 08/15/2022  5:10 PM BY Bess Kinds, MD  Patient complains of months of runny nose, itchy ears, scratch throat and cough. Patient notes that she had a fever on Sunday, but has otherwise been feeling well, and denies any other constitutional symptoms. Ear and throat exam normal, posterior oral pharynx demonstrating significant mucous. Patient report's that she is taking claritin regularly. Given symptoms and timing, most concerned for allergies. Patient would likely benefit from nasal steroid but is against nasal sprays. Patient unlikely to have sinus infection, strep throat, Flu, Covid, or PNA for multiple months with minimal systemic symptoms. Will adjust allergy medicine and offer sinus rinse. Patient requesting referral to ENT to be "drained' if this does not work.  -Switch claritin to zyrtec 10 mg daily -Use sinus rinse 1-2x a day, daily -F/u prn -Consider ENT vs Allergist referral in returns

## 2022-10-10 ENCOUNTER — Encounter (HOSPITAL_COMMUNITY): Payer: Self-pay

## 2022-10-10 ENCOUNTER — Emergency Department (HOSPITAL_COMMUNITY)
Admission: EM | Admit: 2022-10-10 | Discharge: 2022-10-10 | Disposition: A | Discharge status: Home / Self Care | Payer: Medicaid Other | Attending: Emergency Medicine | Admitting: Emergency Medicine

## 2022-10-10 ENCOUNTER — Other Ambulatory Visit: Payer: Self-pay

## 2022-10-10 DIAGNOSIS — Z7982 Long term (current) use of aspirin: Secondary | ICD-10-CM | POA: Diagnosis not present

## 2022-10-10 DIAGNOSIS — Z20822 Contact with and (suspected) exposure to covid-19: Secondary | ICD-10-CM | POA: Diagnosis not present

## 2022-10-10 DIAGNOSIS — Z794 Long term (current) use of insulin: Secondary | ICD-10-CM | POA: Insufficient documentation

## 2022-10-10 DIAGNOSIS — J019 Acute sinusitis, unspecified: Secondary | ICD-10-CM

## 2022-10-10 DIAGNOSIS — R0981 Nasal congestion: Secondary | ICD-10-CM | POA: Diagnosis present

## 2022-10-10 LAB — RESP PANEL BY RT-PCR (RSV, FLU A&B, COVID)  RVPGX2
Influenza A by PCR: NEGATIVE
Influenza B by PCR: NEGATIVE
Resp Syncytial Virus by PCR: NEGATIVE
SARS Coronavirus 2 by RT PCR: NEGATIVE

## 2022-10-10 MED ORDER — AMOXICILLIN-POT CLAVULANATE 875-125 MG PO TABS: 1.0000 | ORAL_TABLET | Freq: Two times a day (BID) | ORAL | 0 refills | Status: DC

## 2022-10-10 NOTE — ED Provider Notes (Signed)
[facility] Provider Note   CSN: [id number] Arrival date & time: 10/10/22  1216     History  Chief Complaint  Patient presents with   Cough    Kayla Gregory is a 62 y.o. female.  62 year old female presents today for evaluation of sinus congestion, sinus pressure, postnasal drip, sore throat for about a month.  Triage notes mention dysphagia however she denies this during my exam.  She is able to tolerate p.o. intake without difficulty.  Started taking Mucinex which has helped bring up phlegm.  She states her phlegm is green in color.  Denies any chest pain, shortness of breath.  Denies fever.  The history is provided by the patient. No language interpreter was used.       Home Medications Prior to Admission medications   Medication Sig Start Date End Date Taking? Authorizing Provider  amoxicillin-clavulanate (AUGMENTIN) 875-125 MG tablet Take 1 tablet by mouth every 12 (twelve) hours. 10/10/22  Yes Deatra Canter, Kaileigh Viswanathan, PA-C  Accu-Chek Softclix Lancets lancets CHECK GLUCOSE ONCE DAILY IN THE MORNING 07/29/22   [name], DO  aspirin 81 MG chewable tablet 1 tablet    [provider]  atorvastatin (LIPITOR) 40 MG tablet TAKE 1 TABLET(40 MG) BY MOUTH DAILY 06/19/22   [name], DO  Blood Glucose Monitoring Suppl (ACCU-CHEK AVIVA PLUS) w/Device KIT 1 application  by Does not apply route 4 (four) times daily -  with meals and at bedtime. 07/29/22   [name], DO  Blood Pressure Monitoring (BLOOD PRESSURE CUFF) MISC 1 each by Does not apply route daily. 07/29/22   [name], DO  calcitRIOL (ROCALTROL) 0.25 MCG capsule Take 0.25 mcg by mouth daily.    [provider]  cetirizine (ZYRTEC) 10 MG tablet Take 1 tablet (10 mg total) by mouth daily as needed for allergies. 08/15/22   Holley Bouche, MD  gabapentin (NEURONTIN) 300 MG capsule Take 100 mg by mouth 2 (two) times daily.    [provider]  glucose blood  (ACCU-CHEK AVIVA PLUS) test strip Use to check blood glucose up to four times daily 07/29/22   [name], DO  influenza vac split quadrivalent PF (FLUARIX) 0.5 ML injection Inject into the muscle. 06/21/21   Carlyle Basques, MD  insulin glargine (LANTUS SOLOSTAR) 100 UNIT/ML Solostar Pen Inject 10 Units into the skin daily. 07/29/22   [name], DO  Insulin Pen Needle (B-D ULTRAFINE III SHORT PEN) 31G X 8 MM MISC USE DAILY AS DIRECTED 07/29/22   [name], DO  Lancet Device MISC Check blood glucose once a day in the AM. 04/13/20   Benay Pike, MD  Lancet Devices [facility]) lancets Use as instructed 11/22/16   Marjie Skiff, MD  pantoprazole (PROTONIX) 40 MG tablet TAKE 1 TABLET(40 MG) BY MOUTH DAILY 08/06/22   [name], DO  Propylene Glycol (SYSTANE COMPLETE) 0.6 % SOLN Place 2-3 drops into both eyes daily as needed (for dry eyes).     [provider]  tobramycin (TOBREX) 0.3 % ophthalmic solution Place 1 drop into both eyes every 6 (six) hours. 03/26/19   [provider]  valsartan-hydrochlorothiazide (DIOVAN-HCT) 160-25 MG tablet TAKE 1 TABLET BY MOUTH DAILY 06/19/22   [name], DO  vitamin B-12 (CYANOCOBALAMIN) 1000 MCG tablet Take 1,000 mcg by mouth daily.    [provider]      Allergies    Metformin and related and Omeprazole    Review of Systems  Review of Systems  Constitutional:  Negative for chills and fever.  HENT:  Positive for congestion, postnasal drip, sinus pressure and sore throat.   Respiratory:  Positive for cough. Negative for shortness of breath.   Cardiovascular:  Negative for chest pain.  Neurological:  Negative for light-headedness.  All other systems reviewed and are negative.   Physical Exam Updated Vital Signs BP (!) 137/58   Pulse (!) 59   Temp 98.7 F (37.1 C) (Oral)   Resp 18   Ht 5' 1"$  (1.549 m)   Wt 66.9 kg   SpO2 100%   BMI 27.87 kg/m  Physical Exam Vitals and nursing note reviewed.   Constitutional:      General: She is not in acute distress.    Appearance: Normal appearance. She is not ill-appearing.  HENT:     Head: Normocephalic and atraumatic.     Right Ear: Tympanic membrane, ear canal and external ear normal. There is no impacted cerumen.     Left Ear: Tympanic membrane, ear canal and external ear normal. There is no impacted cerumen.     Nose: Nose normal.     Mouth/Throat:     Mouth: Mucous membranes are moist.     Pharynx: No oropharyngeal exudate or posterior oropharyngeal erythema.  Eyes:     General: No scleral icterus.    Extraocular Movements: Extraocular movements intact.     Conjunctiva/sclera: Conjunctivae normal.  Cardiovascular:     Rate and Rhythm: Normal rate and regular rhythm.     Pulses: Normal pulses.  Pulmonary:     Effort: Pulmonary effort is normal. No respiratory distress.     Breath sounds: Normal breath sounds. No wheezing or rales.  Abdominal:     General: There is no distension.     Tenderness: There is no abdominal tenderness.  Musculoskeletal:        General: Normal range of motion.     Cervical back: Normal range of motion.  Skin:    General: Skin is warm and dry.  Neurological:     General: No focal deficit present.     Mental Status: She is alert. Mental status is at baseline.     ED Results / Procedures / Treatments   Labs (all labs ordered are listed, but only abnormal results are displayed) Labs Reviewed  RESP PANEL BY RT-PCR (RSV, FLU A&B, COVID)  [id number]    EKG None  Radiology No results found.  Procedures Procedures    Medications Ordered in ED Medications - No data to display  ED Course/ Medical Decision Making/ A&P                             Medical Decision Making Amount and/or Complexity of Data Reviewed Radiology: ordered.  Risk Prescription drug management.   62 year old female presents with signs and symptoms consistent with sinusitis.  Given duration of symptoms we will treat  with antibiotic to cover for bacterial source.  Discussed importance of sinus rinse.  Return precautions discussed.  She will follow-up with her PCP tomorrow as she has a previously scheduled appointment.  No suspicion for pneumonia as she is maintaining 100% O2 sats on room air.  She is without chest pain, shortness of breath.  Clear lung sounds on auscultation.  Final Clinical Impression(s) / ED Diagnoses Final diagnoses:  Acute sinusitis, recurrence not specified, unspecified location    Rx / DC Orders ED Discharge Orders  Ordered    amoxicillin-clavulanate (AUGMENTIN) 875-125 MG tablet  Every 12 hours        10/10/22 1238              Evlyn Courier, PA-C 10/10/22 [place], Ankit, MD 10/10/22 1534

## 2022-10-10 NOTE — Discharge Instructions (Signed)
Your exam today is consistent with a sinus infection.  I have sent antibiotic into the pharmacy for you.  Also recommend that she do a sinus rinse.  A respiratory panel was collected however this will not change the management.  He can follow-up on the results on MyChart.  For any concerning symptoms return to the emergency department otherwise keep your appointment with your primary care provider as you have scheduled for tomorrow.

## 2022-10-10 NOTE — ED Triage Notes (Addendum)
Pt c/o productive cough w/green mucous, nasal congestion, soire throat, [id number]

## 2022-10-11 ENCOUNTER — Other Ambulatory Visit: Payer: Self-pay

## 2022-10-11 ENCOUNTER — Ambulatory Visit (INDEPENDENT_AMBULATORY_CARE_PROVIDER_SITE_OTHER): Payer: Medicaid Other | Admitting: Student

## 2022-10-11 ENCOUNTER — Encounter: Payer: Self-pay | Admitting: Student

## 2022-10-11 VITALS — BP 126/70 | HR 64 | Ht 61.0 in | Wt 147.6 lb

## 2022-10-11 DIAGNOSIS — Z794 Long term (current) use of insulin: Secondary | ICD-10-CM

## 2022-10-11 DIAGNOSIS — I10 Essential (primary) hypertension: Secondary | ICD-10-CM

## 2022-10-11 DIAGNOSIS — N184 Chronic kidney disease, stage 4 (severe): Secondary | ICD-10-CM | POA: Diagnosis not present

## 2022-10-11 DIAGNOSIS — E1100 Type 2 diabetes mellitus with hyperosmolarity without nonketotic hyperglycemic-hyperosmolar coma (NKHHC): Secondary | ICD-10-CM | POA: Diagnosis not present

## 2022-10-11 DIAGNOSIS — J329 Chronic sinusitis, unspecified: Secondary | ICD-10-CM | POA: Diagnosis not present

## 2022-10-11 MED ORDER — LANTUS SOLOSTAR 100 UNIT/ML ~~LOC~~ SOPN: 10.0000 [IU] | PEN_INJECTOR | Freq: Every day | SUBCUTANEOUS | 3 refills | Status: DC

## 2022-10-11 MED ORDER — ACCU-CHEK SOFTCLIX LANCETS MISC: 12 refills | Status: DC

## 2022-10-11 MED ORDER — BD PEN NEEDLE SHORT U/F 31G X 8 MM MISC: 1 refills | Status: DC

## 2022-10-11 MED ORDER — ACCU-CHEK AVIVA PLUS VI STRP: ORAL_STRIP | 11 refills | Status: DC

## 2022-10-11 NOTE — Progress Notes (Signed)
    SUBJECTIVE:   CHIEF COMPLAINT / HPI:   Kayla Gregory is a 62 y.o. female  presenting for follow up of chronic conditions.   Chronic sinusitis: patient has had a runny nose and congestion for over 2 months. She has been taking Zyrtec without relief.  She refuses to use intranasal sprays.  Recently went to ED for evaluation and was prescribed Augmentin.  Tested negative for COVID, Flu, RSV. She is starting antibiotic course today (2/23).   [id number]: Patient seen by nephrologist 09/30/2022 - Patient is not interested in PD catheter placement at this time   HTN:  Diovan 160-25 mg  Stable pressures at home, checks daily. No side effects from the medications   PERTINENT  PMH / PSH: reviewed   OBJECTIVE:   BP 126/70   Pulse 64   Ht 5' 1"$  (1.549 m)   Wt 147 lb 9.6 oz (67 kg)   SpO2 99%   BMI 27.89 kg/m   Chronically ill-appearing, no acute distress Cardio: Regular rate, regular rhythm, no murmurs on exam. Pulm: Clear, no wheezing, no crackles. No increased work of breathing Abdominal: bowel sounds present, soft, non-tender, non-distended Extremities: no peripheral edema    ASSESSMENT/PLAN:   Chronic sinusitis Patient requesting referral to ENT for sinus drainage, apparently two ladies in her church had the procedure done.  - non-urgent referral to ENT placed, would like patient to complete antibiotic course before being scheduled  CKD (chronic kidney disease), stage IV ([place]) Checking BMP today. Patient understands that she is close to needing dialysis. She is interested in close monitoring.  - will need to start health care POA discussions at next visit   Essential hypertension Stable. Continue current medication   Refilled insulin, lancets, and needles.   [name], [place]

## 2022-10-11 NOTE — Assessment & Plan Note (Signed)
Patient requesting referral to ENT for sinus drainage, apparently two ladies in her church had the procedure done.  - non-urgent referral to ENT placed, would like patient to complete antibiotic course before being scheduled

## 2022-10-11 NOTE — Assessment & Plan Note (Signed)
Stable.  Continue current medication

## 2022-10-11 NOTE — Patient Instructions (Signed)
It was great to see you today!   Today we addressed: Congestion, I sent in a referral to ENT.  Patient can call back from within 1 to 2 weeks.  If not please give our office a call and we will follow-up.  I am checking your kidney function today.   You should return to our clinic Return in about 3 months (around 01/09/2023).  Please arrive 15 minutes before your appointment to ensure smooth check in process.    Please call the clinic at [phone] if your symptoms worsen or you have any concerns.  Thank you for allowing me to participate in your care, Dr. [name] [facility]

## 2022-10-11 NOTE — Assessment & Plan Note (Signed)
Checking BMP today. Patient understands that she is close to needing dialysis. She is interested in close monitoring.  - will need to start health care POA discussions at next visit

## 2022-10-12 LAB — BASIC METABOLIC PANEL
BUN/Creatinine Ratio: 11 — ABNORMAL LOW (ref 12–28)
BUN: 56 mg/dL — ABNORMAL HIGH (ref 8–27)
CO2: 23 mmol/L (ref 20–29)
Calcium: 10.2 mg/dL (ref 8.7–10.3)
Chloride: 99 mmol/L (ref 96–106)
Creatinine, Ser: 5.31 mg/dL — ABNORMAL HIGH (ref 0.57–1.00)
Glucose: 110 mg/dL — ABNORMAL HIGH (ref 70–99)
Potassium: 4 mmol/L (ref 3.5–5.2)
Sodium: 139 mmol/L (ref 134–144)
eGFR: 8 mL/min/{1.73_m2} — ABNORMAL LOW (ref >59)

## 2022-10-14 ENCOUNTER — Encounter: Payer: Self-pay | Admitting: Student

## 2022-10-14 NOTE — Progress Notes (Signed)
Letter of lab results forwarded to RN team.   [name], DO [facility], PGY-1 10/14/22 8:49 AM

## 2022-11-01 ENCOUNTER — Encounter: Payer: Self-pay | Admitting: Podiatry

## 2022-11-01 ENCOUNTER — Ambulatory Visit (INDEPENDENT_AMBULATORY_CARE_PROVIDER_SITE_OTHER): Payer: Medicaid Other

## 2022-11-01 ENCOUNTER — Ambulatory Visit (INDEPENDENT_AMBULATORY_CARE_PROVIDER_SITE_OTHER): Payer: Medicaid Other | Admitting: Podiatry

## 2022-11-01 DIAGNOSIS — M2011 Hallux valgus (acquired), right foot: Secondary | ICD-10-CM

## 2022-11-01 DIAGNOSIS — M2012 Hallux valgus (acquired), left foot: Secondary | ICD-10-CM

## 2022-11-01 DIAGNOSIS — M2042 Other hammer toe(s) (acquired), left foot: Secondary | ICD-10-CM

## 2022-11-01 DIAGNOSIS — M205X1 Other deformities of toe(s) (acquired), right foot: Secondary | ICD-10-CM | POA: Diagnosis not present

## 2022-11-01 DIAGNOSIS — M7752 Other enthesopathy of left foot: Secondary | ICD-10-CM

## 2022-11-01 MED ORDER — TRIAMCINOLONE ACETONIDE 40 MG/ML IJ SUSP
20.0000 mg | Freq: Once | INTRAMUSCULAR | Status: AC
  Administered 2022-11-01: 20 mg

## 2022-11-01 NOTE — Progress Notes (Signed)
Subjective: 62 y.o. female presents today for follow-up evaluation of a symptomatic bunion to the left foot.  Patient has been scheduled previously for bunion surgery to the left foot but due to the events she has been unable to proceed with the surgery.  She states that now she is ready for surgery.  She has a support system and she says that her nephew is going to stay with her postoperatively to help care for her.  PSxHx : DOS 03/06/17 Bunionectomy w 1st met osteotomy Rt, hammertoe repair w capsulotomy 2nd Rt, derotational arthroplasty 5th digit bilateral  Patient had good success with good healing of the right foot surgery.   Past Medical History:  Diagnosis Date   Arthritis    Diabetes mellitus without complication ([place]) [id number]   Environmental and seasonal allergies    GERD (gastroesophageal reflux disease)    Hyperlipidemia associated with type 2 diabetes mellitus ([place])    Hypertension associated with diabetes ([place])    Schizophrenia ([place])    Past Surgical History:  Procedure Laterality Date   CATARACT EXTRACTION W/ INTRAOCULAR LENS  IMPLANT, BILATERAL     COLONOSCOPY     JOINT REPLACEMENT Left    Hip   PTOSIS REPAIR Left 12/25/2017   Procedure: INTERNAL PTOSIS REPAIR LEFT EYE;  Surgeon: Clista Bernhardt, MD;  Location: [facility];  Service: Ophthalmology;  Laterality: Left;   TONSILLECTOMY     Allergies  Allergen Reactions   Metformin And Related Diarrhea and Other (See Comments)    GI symptoms including abdominal pain and diarrhea - Pt not interested in taking again   Omeprazole Other (See Comments) and Rash    Denies airway involvement    Objective: Physical Exam General: The patient is alert and oriented x3 in no acute distress.  Dermatology: Skin is cool, dry and supple bilateral lower extremities. Negative for open lesions or macerations.  Vascular: Palpable pedal pulses bilaterally. No edema or erythema noted. Capillary refill within normal limits.  Neurological:  Epicritic and protective threshold grossly intact bilaterally.   Musculoskeletal Exam: Clinical evidence of bunion deformity noted to the respective foot. There is moderate pain on palpation range of motion of the first MPJ. Lateral deviation of the hallux noted consistent with hallux abductovalgus.  Hammertoe also noted to the second digit of the left foot with contracture of the MTP and plantarflexion of the PIP.   Radiographic Exam LT foot 11/01/2022: Increased intermetatarsal angle greater than 15 with a hallux abductus angle greater than 30 noted on AP view. Moderate degenerative changes noted within the first MPJ.  Hammertoe contracture also noted to the second digit left foot  Radiographic exam RT foot 11/01/2022: Prior evidence of bunionectomy with first metatarsal osteotomy to the right foot.  Appears stable with good routine healing.  There is degenerative changes noted throughout the first MTP arthroplasty also noted to the second digit right foot.  No acute fractures.  Normal osseous mineralization.  Assessment: 1. HAV w/ bunion deformity left 2.  Hammertoe second digit left 3. PSxHx bunionectomy with progressive degenerative changes consistent with hallux limitus right DOS 03/06/17 Bunionectomy w 1st met osteotomy Rt, hammertoe repair w capsulotomy 2nd Rt, derotational arthroplasty 5th digit bilateral     Plan of Care:  1. Patient was evaluated. X-Rays reviewed. 2. Today again we discussed surgical correction of the bunion and hammertoe deformity.  Due to the arthritic changes on x-ray today I do believe she would benefit more from a great toe arthroplasty with implant  versus traditional bunion correction.  Risk benefits advantages and disadvantages were explained in detail to the patient.  No guarantees were expressed or implied.  All patient questions answered. 3.  Authorization for surgery was reinitiated today.  Surgery will consist of left great toe arthroplasty with implant.   PIPJ arthroplasty with MTP capsulotomy left second digit. 4.  In the meantime, injection of 0.5 cc Celestone Soluspan injected into the first MTP left to provide some temporary relief for the patient 5.  Return to clinic 1 week postop      Edrick Kins, DPM [facility]  Dr. Edrick Kins, DPM    [address], [place] [address]                Office [phone]  Fax [phone]

## 2022-11-13 ENCOUNTER — Ambulatory Visit: Payer: Medicaid Other | Admitting: Podiatry

## 2022-11-21 ENCOUNTER — Ambulatory Visit (INDEPENDENT_AMBULATORY_CARE_PROVIDER_SITE_OTHER): Payer: Medicaid Other | Admitting: Student

## 2022-11-21 ENCOUNTER — Other Ambulatory Visit: Payer: Self-pay

## 2022-11-21 ENCOUNTER — Encounter: Payer: Self-pay | Admitting: Student

## 2022-11-21 ENCOUNTER — Telehealth: Payer: Self-pay

## 2022-11-21 VITALS — BP 112/74 | HR 60 | Ht 61.0 in | Wt 152.4 lb

## 2022-11-21 DIAGNOSIS — Z794 Long term (current) use of insulin: Secondary | ICD-10-CM | POA: Diagnosis not present

## 2022-11-21 DIAGNOSIS — E1100 Type 2 diabetes mellitus with hyperosmolarity without nonketotic hyperglycemic-hyperosmolar coma (NKHHC): Secondary | ICD-10-CM

## 2022-11-21 DIAGNOSIS — N184 Chronic kidney disease, stage 4 (severe): Secondary | ICD-10-CM

## 2022-11-21 LAB — POCT GLYCOSYLATED HEMOGLOBIN (HGB A1C): HbA1c, POC (controlled diabetic range): 6.2 % (ref 0.0–7.0)

## 2022-11-21 NOTE — Telephone Encounter (Signed)
Printed and faxed office visit to [facility].

## 2022-11-21 NOTE — H&P (View-Only) (Signed)
Surgical Pre-operative Evaluation:   Procedure: great toe arthoplasty w/ implant  Procedural risk: Intermediate    PMH: CKD5, T2DM, HLD, HTN   Surgical History:  The patient denies any complication with anesthesia, bleeding, or post-operative confusion, nausea, or vomiting in prior surgical interventions. The patient denies any history of spinal surgeries.  Family History: The patient denies any family history of complications with anesthesia and VTE.  Social History: Tobacco Use: denies  Alcohol Use: denies  Other substance use: denies   Screening for OSA: STOP BANG score of 1, Low risk of OSA  Medication Adjustments:  - Generally hold ACEI/ARB, and diuretics on day of surgery - Insulin- reduce basal dose by 10-25% prior to surgery (either night before or morning of depending on how patient takes it) and hold prandial insulin on day of surgery - Aspirin - secondary prevention continue if possible    Risk Stratification Tool: Gupta Calculator: 1.1 % risk of MI or cardiac event, intraoperatively or up to 30 days post-op  Functional Capacity: Patient walks on treadmill daily, can climb multiple flights of stairs.   Labs to Check: - CBC - Renal function - A1c - Urine Cr/Albumin   Recently evaluated by cardiology:  NORMAL STRESS TEST. NORMAL RESTING STUDY WITH NO WALL MOTION ABNORMALITIES AT REST AND PEAK STRESS. NO DOPPLER PERFORMED FOR VALVULAR REGURGITATION NO DOPPLER PERFORMED FOR VALVULAR STENOSIS Note: SEE SAME DAY TTE REPORT FOR COMPLETE INTERPRETATION OF RESTING IMAGES Maximum workload of 7.00 METs was achieved during exercise. RESTING HYPERTENSION - APPROPRIATE RESPONSE   Final Assessment:  The patient is at intermediate risk of complications from a moderate risk surgery.  I recommend the following additional tests: CBC, BMP, A1c, Urine microalbumin/cr ratio  I recommend the following changes to medications in the perioperative setting:  Continue  ASA Reduce Insulin to 6 units day of surgery  Hold Diovan day of surgery  

## 2022-11-21 NOTE — Progress Notes (Signed)
Surgical Pre-operative Evaluation:   Procedure: great toe arthoplasty w/ implant  Procedural risk: Intermediate    PMH: CKD5, T2DM, HLD, HTN   Surgical History:  The patient denies any complication with anesthesia, bleeding, or post-operative confusion, nausea, or vomiting in prior surgical interventions. The patient denies any history of spinal surgeries.  Family History: The patient denies any family history of complications with anesthesia and VTE.  Social History: Tobacco Use: denies  Alcohol Use: denies  Other substance use: denies   Screening for OSA: STOP BANG score of 1, Low risk of OSA  Medication Adjustments:  - Generally hold ACEI/ARB, and diuretics on day of surgery - Insulin- reduce basal dose by 10-25% prior to surgery (either night before or morning of depending on how patient takes it) and hold prandial insulin on day of surgery - Aspirin - secondary prevention continue if possible    Risk Stratification Tool: Lyndel Safe Calculator: 1.1 % risk of MI or cardiac event, intraoperatively or up to 30 days post-op  Functional Capacity: Patient walks on treadmill daily, can climb multiple flights of stairs.   Labs to Check: - CBC - Renal function - A1c - Urine Cr/Albumin   Recently evaluated by cardiology:  NORMAL STRESS TEST. NORMAL RESTING STUDY WITH NO WALL MOTION ABNORMALITIES AT REST AND PEAK STRESS. NO DOPPLER PERFORMED FOR VALVULAR REGURGITATION NO DOPPLER PERFORMED FOR VALVULAR STENOSIS Note: SEE SAME DAY TTE REPORT FOR COMPLETE INTERPRETATION OF RESTING IMAGES Maximum workload of 7.00 METs was achieved during exercise. RESTING HYPERTENSION - APPROPRIATE RESPONSE   Final Assessment:  The patient is at intermediate risk of complications from a moderate risk surgery.  I recommend the following additional tests: CBC, BMP, A1c, Urine microalbumin/cr ratio  I recommend the following changes to medications in the perioperative setting:  Continue  ASA Reduce Insulin to 6 units day of surgery  Hold Diovan day of surgery

## 2022-11-21 NOTE — Patient Instructions (Signed)
You were assessed for surgical risk stratification.   I recommend the following:  Continue your Asprin unless your surgeon requests you stop.  Reduce your insulin to 6 units the day of your surgery  Hold your blood pressure medication, the Diovan, the day of surgery   I am ordering several labs today to help with stratifying your risk. If anything abnormal or concerning shows I will give you a call and let your surgeon know.  [name], DO [facility], PGY-1 11/21/22 9:09 AM

## 2022-11-21 NOTE — Telephone Encounter (Signed)
Called and spoke with Kayla Gregory about telephone call with staff. Per [name], I did not fill out the form saying she could have the surgery. During our office visit, I filled out the forms provided by the patient, signed, and returned them to her.   I explained to [name] that the purpose of our meeting today was to risk stratify her complications from surgery, not to prevent her from having it. We are faxing over the office note from today to her podiatrist's office. All questions answered, patient apologized for being rude to staff and myself.   [name], DO [facility], PGY-1 11/21/22 1:04 PM

## 2022-11-21 NOTE — Telephone Encounter (Signed)
Patient LVM on nurse line requesting to speak with Dr. Sabra Heck urgently. Returned call to patient, explained that I was a nurse returning her call to gather more information.   Patient states that she needs provider to sign clearance form. States that form was not signed after visit today. Advised patient that I would send message to provider regarding clearance form. She states the following.   "The Reita Cliche is with me and y'all don't know my body like I do. I am fine to get my surgery."  She then became upset that I called her when "I specifically asked to speak with Dr. Sabra Heck on the voicemail."   Advised patient that when an urgent voicemail is left, a nurse will return the call initially and then forward to provider.   Patient would like to speak with Dr. Sabra Heck as soon as possible.   Talbot Grumbling, RN

## 2022-11-22 ENCOUNTER — Encounter: Payer: Self-pay | Admitting: Student

## 2022-11-22 LAB — BASIC METABOLIC PANEL
BUN/Creatinine Ratio: 10 — ABNORMAL LOW (ref 12–28)
BUN: 50 mg/dL — ABNORMAL HIGH (ref 8–27)
CO2: 23 mmol/L (ref 20–29)
Calcium: 9.7 mg/dL (ref 8.7–10.3)
Chloride: 101 mmol/L (ref 96–106)
Creatinine, Ser: 4.94 mg/dL — ABNORMAL HIGH (ref 0.57–1.00)
Glucose: 88 mg/dL (ref 70–99)
Potassium: 4.8 mmol/L (ref 3.5–5.2)
Sodium: 140 mmol/L (ref 134–144)
eGFR: 9 mL/min/{1.73_m2} — ABNORMAL LOW (ref >59)

## 2022-11-22 LAB — CBC
Hematocrit: 28 % — ABNORMAL LOW (ref 34.0–46.6)
Hemoglobin: 9.4 g/dL — ABNORMAL LOW (ref 11.1–15.9)
MCH: 31 pg (ref 26.6–33.0)
MCHC: 33.6 g/dL (ref 31.5–35.7)
MCV: 92 fL (ref 79–97)
Platelets: 203 10*3/uL (ref 150–450)
RBC: 3.03 x10E6/uL — ABNORMAL LOW (ref 3.77–5.28)
RDW: 12.2 % (ref 11.7–15.4)
WBC: 4.1 10*3/uL (ref 3.4–10.8)

## 2022-11-22 LAB — MICROALBUMIN / CREATININE URINE RATIO
Creatinine, Urine: 39.2 mg/dL
Microalb/Creat Ratio: 1029 mg/g creat — ABNORMAL HIGH (ref 0–29)
Microalbumin, Urine: 403.4 ug/mL

## 2022-11-22 NOTE — Progress Notes (Signed)
Letter sent to RN team to mail to patient.   Glendale Chard, DO [facility], PGY-1 11/22/22 9:04 AM

## 2022-12-03 ENCOUNTER — Encounter (HOSPITAL_BASED_OUTPATIENT_CLINIC_OR_DEPARTMENT_OTHER): Payer: Self-pay | Admitting: Podiatry

## 2022-12-04 ENCOUNTER — Encounter (HOSPITAL_BASED_OUTPATIENT_CLINIC_OR_DEPARTMENT_OTHER): Payer: Self-pay | Admitting: Podiatry

## 2022-12-04 NOTE — Progress Notes (Addendum)
Spoke w/ via phone for pre-op interview--- pt Lab needs dos---- [employer] results------ current EKG in epic/ chart COVID test -----patient states asymptomatic no test needed Arrive at -------  1130 on 12-12-2022 NPO after MN NO Solid Food.  Clear liquids from MN until--- 1030 Med rec completed--  pt will call @ 0900 on 12-05-2022 to complete med list when she is at home And update if any other med to take am dos Medications to take morning of surgery ----- protonix, lipitor, gabapentin, calcitriol, prozac, clonidine Diabetic medication ----- do 6 units lanus insulin as directed/ instructed by you pcp (refer to pcp office note in epic 11-21-2022 and her H&P) Patient instructed no nail polish to be worn day of surgery Patient instructed to bring photo id and insurance card day of surgery Patient aware to have Driver (ride ) / caregiver    for 24 hours after surgery -- sister, reba Patient Special Instructions ----- n/a Pre-Op special Instructions ----- pt's pcp, Dr Glendale Chard, H&P dated 11-21-2022 received via faxed from Dr Logan Bores office , placed w/ chart. Pre-op orders pending Patient verbalized understanding of instructions that were given at this phone interview. Patient denies shortness of breath, chest pain, fever, cough at this phone interview.   Anesthesia Review: HTN;  CKD 5 secondary to diabetic nephropathy,  pt is not on dialysis;  schizophrenia ;  hx hep c , treated 2016, cured;  per pt has completed work-up needed to be placed on [facility] list at [facility], stated not on it yet  PCP:  Dr Samara Deist ([place] 11-21-2022 epic) Nephrologist:  Dr I. Janit Pagan ([place] 09-30-2022 epic) Chest x-ray : 05-09-2022 EKG :  05-09-2022 Echo : 11-14-2022 and Stress echo , care everywhere Cardiac Cath : no Activity level: denies sob w/ any activity Fasting Blood Sugar :  98--118    / Checks Blood Sugar -- times a day:  daily am Blood Thinner/ Instructions /Last Dose: no ASA / Instructions/  Last Dose : ASA /  per pcp pt so continue asa not stop prior to surgery

## 2022-12-11 ENCOUNTER — Telehealth: Payer: Self-pay | Admitting: Student

## 2022-12-11 DIAGNOSIS — G6289 Other specified polyneuropathies: Secondary | ICD-10-CM

## 2022-12-11 MED ORDER — GABAPENTIN 100 MG PO CAPS: 100.0000 mg | ORAL_CAPSULE | Freq: Every evening | ORAL | 2 refills | Status: DC | PRN

## 2022-12-11 NOTE — Telephone Encounter (Signed)
Patient calls the after hours line requesting a refill of gabapentin. I reminded her that she should call her pharmacy for refill requests but she shares that when she calls the pharmacy "no one picks up" and tells me she's been trying for weeks. She has an upcoming podiatric procedure tomorrow and would like to have her gabapentin on hand for pain control.  - Refill sent to patient's pharmacy. Appropriate renal dosing for her CKD  Kayla Mccoy, MD

## 2022-12-12 ENCOUNTER — Encounter (HOSPITAL_BASED_OUTPATIENT_CLINIC_OR_DEPARTMENT_OTHER)
Admission: RE | Disposition: A | Discharge status: Home / Self Care | Payer: Self-pay | Source: Home / Self Care | Attending: Podiatry

## 2022-12-12 ENCOUNTER — Encounter: Payer: Self-pay | Admitting: Podiatry

## 2022-12-12 ENCOUNTER — Ambulatory Visit (HOSPITAL_BASED_OUTPATIENT_CLINIC_OR_DEPARTMENT_OTHER)
Admission: RE | Admit: 2022-12-12 | Discharge: 2022-12-12 | Disposition: A | Discharge status: Home / Self Care | Payer: Medicaid Other | Attending: Podiatry | Admitting: Podiatry

## 2022-12-12 ENCOUNTER — Ambulatory Visit (HOSPITAL_BASED_OUTPATIENT_CLINIC_OR_DEPARTMENT_OTHER): Payer: Medicaid Other

## 2022-12-12 ENCOUNTER — Ambulatory Visit (HOSPITAL_BASED_OUTPATIENT_CLINIC_OR_DEPARTMENT_OTHER): Payer: Medicaid Other | Admitting: Anesthesiology

## 2022-12-12 ENCOUNTER — Other Ambulatory Visit: Payer: Self-pay

## 2022-12-12 ENCOUNTER — Encounter (HOSPITAL_BASED_OUTPATIENT_CLINIC_OR_DEPARTMENT_OTHER): Payer: Self-pay | Admitting: Podiatry

## 2022-12-12 DIAGNOSIS — M205X1 Other deformities of toe(s) (acquired), right foot: Secondary | ICD-10-CM

## 2022-12-12 DIAGNOSIS — N185 Chronic kidney disease, stage 5: Secondary | ICD-10-CM | POA: Insufficient documentation

## 2022-12-12 DIAGNOSIS — D649 Anemia, unspecified: Secondary | ICD-10-CM | POA: Insufficient documentation

## 2022-12-12 DIAGNOSIS — M7752 Other enthesopathy of left foot: Secondary | ICD-10-CM

## 2022-12-12 DIAGNOSIS — I10 Essential (primary) hypertension: Secondary | ICD-10-CM | POA: Diagnosis not present

## 2022-12-12 DIAGNOSIS — E1122 Type 2 diabetes mellitus with diabetic chronic kidney disease: Secondary | ICD-10-CM | POA: Diagnosis not present

## 2022-12-12 DIAGNOSIS — D759 Disease of blood and blood-forming organs, unspecified: Secondary | ICD-10-CM | POA: Diagnosis not present

## 2022-12-12 DIAGNOSIS — Z01818 Encounter for other preprocedural examination: Secondary | ICD-10-CM

## 2022-12-12 DIAGNOSIS — Z87891 Personal history of nicotine dependence: Secondary | ICD-10-CM

## 2022-12-12 DIAGNOSIS — I120 Hypertensive chronic kidney disease with stage 5 chronic kidney disease or end stage renal disease: Secondary | ICD-10-CM | POA: Diagnosis not present

## 2022-12-12 DIAGNOSIS — M2042 Other hammer toe(s) (acquired), left foot: Secondary | ICD-10-CM | POA: Diagnosis not present

## 2022-12-12 DIAGNOSIS — M205X2 Other deformities of toe(s) (acquired), left foot: Secondary | ICD-10-CM | POA: Diagnosis present

## 2022-12-12 DIAGNOSIS — Z794 Long term (current) use of insulin: Secondary | ICD-10-CM | POA: Insufficient documentation

## 2022-12-12 DIAGNOSIS — M2022 Hallux rigidus, left foot: Secondary | ICD-10-CM

## 2022-12-12 HISTORY — PX: HAMMER TOE SURGERY: SHX385

## 2022-12-12 HISTORY — DX: Anemia in chronic kidney disease: D63.1

## 2022-12-12 HISTORY — PX: BUNIONECTOMY: SHX129

## 2022-12-12 HISTORY — DX: Personal history of other infectious and parasitic diseases: Z86.19

## 2022-12-12 HISTORY — DX: Unspecified acquired deformity of right forearm: M21.931

## 2022-12-12 HISTORY — DX: Presence of spectacles and contact lenses: Z97.3

## 2022-12-12 HISTORY — PX: CAPSULOTOMY METATARSOPHALANGEAL: SHX6614

## 2022-12-12 HISTORY — DX: Chronic kidney disease, stage 5: N18.5

## 2022-12-12 HISTORY — DX: Type 2 diabetes mellitus with diabetic nephropathy: E11.21

## 2022-12-12 LAB — POCT I-STAT, CHEM 8
BUN: 47 mg/dL — ABNORMAL HIGH (ref 8–23)
Calcium, Ion: 1.32 mmol/L (ref 1.15–1.40)
Chloride: 103 mmol/L (ref 98–111)
Creatinine, Ser: 5.8 mg/dL — ABNORMAL HIGH (ref 0.44–1.00)
Glucose, Bld: 216 mg/dL — ABNORMAL HIGH (ref 70–99)
HCT: 30 % — ABNORMAL LOW (ref 36.0–46.0)
Hemoglobin: 10.2 g/dL — ABNORMAL LOW (ref 12.0–15.0)
Potassium: 4 mmol/L (ref 3.5–5.1)
Sodium: 139 mmol/L (ref 135–145)
TCO2: 27 mmol/L (ref 22–32)

## 2022-12-12 LAB — GLUCOSE, CAPILLARY: Glucose-Capillary: 197 mg/dL — ABNORMAL HIGH (ref 70–99)

## 2022-12-12 SURGERY — BUNIONECTOMY
Anesthesia: General | Site: Toe | Laterality: Left

## 2022-12-12 MED ORDER — CEFAZOLIN SODIUM-DEXTROSE 2-4 GM/100ML-% IV SOLN
2.0000 g | INTRAVENOUS | Status: AC
  Administered 2022-12-12: 2 g via INTRAVENOUS

## 2022-12-12 MED ORDER — CELECOXIB 200 MG PO CAPS
200.0000 mg | ORAL_CAPSULE | Freq: Once | ORAL | Status: AC
  Administered 2022-12-12: 200 mg via ORAL

## 2022-12-12 MED ORDER — NALOXONE HCL 0.4 MG/ML IJ SOLN
INTRAMUSCULAR | Status: DC | PRN
  Administered 2022-12-12 (×2): 4 ug via INTRAVENOUS

## 2022-12-12 MED ORDER — ACETAMINOPHEN 325 MG PO TABS: 325.0000 mg | ORAL_TABLET | ORAL | Status: DC | PRN

## 2022-12-12 MED ORDER — OXYCODONE HCL 5 MG/5ML PO SOLN: 5.0000 mg | Freq: Once | ORAL | Status: DC | PRN

## 2022-12-12 MED ORDER — NALOXONE HCL 0.4 MG/ML IJ SOLN
INTRAMUSCULAR | Status: AC
  Filled 2022-12-12: qty 1

## 2022-12-12 MED ORDER — FENTANYL CITRATE (PF) 100 MCG/2ML IJ SOLN
INTRAMUSCULAR | Status: DC | PRN
  Administered 2022-12-12 (×2): 25 ug via INTRAVENOUS
  Administered 2022-12-12: 50 ug via INTRAVENOUS

## 2022-12-12 MED ORDER — ACETAMINOPHEN 160 MG/5ML PO SOLN: 325.0000 mg | ORAL | Status: DC | PRN

## 2022-12-12 MED ORDER — SODIUM CHLORIDE 0.9 % IV SOLN: INTRAVENOUS | Status: DC

## 2022-12-12 MED ORDER — LIDOCAINE HCL (PF) 2 % IJ SOLN
INTRAMUSCULAR | Status: AC
  Filled 2022-12-12: qty 5

## 2022-12-12 MED ORDER — DEXMEDETOMIDINE HCL IN NACL 200 MCG/50ML IV SOLN: INTRAVENOUS | Status: DC | PRN

## 2022-12-12 MED ORDER — CELECOXIB 200 MG PO CAPS
ORAL_CAPSULE | ORAL | Status: AC
  Filled 2022-12-12: qty 1

## 2022-12-12 MED ORDER — PROPOFOL 10 MG/ML IV BOLUS
INTRAVENOUS | Status: DC | PRN
  Administered 2022-12-12: 200 mg via INTRAVENOUS

## 2022-12-12 MED ORDER — PROPOFOL 10 MG/ML IV BOLUS
INTRAVENOUS | Status: AC
  Filled 2022-12-12: qty 20

## 2022-12-12 MED ORDER — MEPERIDINE HCL 25 MG/ML IJ SOLN: 6.2500 mg | INTRAMUSCULAR | Status: DC | PRN

## 2022-12-12 MED ORDER — ACETAMINOPHEN 500 MG PO TABS
1000.0000 mg | ORAL_TABLET | Freq: Once | ORAL | Status: AC
  Administered 2022-12-12: 1000 mg via ORAL

## 2022-12-12 MED ORDER — GLYCOPYRROLATE PF 0.2 MG/ML IJ SOSY
PREFILLED_SYRINGE | INTRAMUSCULAR | Status: AC
  Filled 2022-12-12: qty 1

## 2022-12-12 MED ORDER — ONDANSETRON HCL 4 MG/2ML IJ SOLN: 4.0000 mg | Freq: Once | INTRAMUSCULAR | Status: DC | PRN

## 2022-12-12 MED ORDER — OXYCODONE HCL 5 MG PO TABS: 5.0000 mg | ORAL_TABLET | Freq: Once | ORAL | Status: DC | PRN

## 2022-12-12 MED ORDER — FENTANYL CITRATE (PF) 100 MCG/2ML IJ SOLN: 25.0000 ug | INTRAMUSCULAR | Status: DC | PRN

## 2022-12-12 MED ORDER — LIDOCAINE 2% (20 MG/ML) 5 ML SYRINGE
INTRAMUSCULAR | Status: DC | PRN
  Administered 2022-12-12: 100 mg via INTRAVENOUS

## 2022-12-12 MED ORDER — PROPOFOL 500 MG/50ML IV EMUL
INTRAVENOUS | Status: AC
  Filled 2022-12-12: qty 50

## 2022-12-12 MED ORDER — DEXMEDETOMIDINE HCL IN NACL 80 MCG/20ML IV SOLN
INTRAVENOUS | Status: AC
  Filled 2022-12-12: qty 20

## 2022-12-12 MED ORDER — FENTANYL CITRATE (PF) 100 MCG/2ML IJ SOLN
INTRAMUSCULAR | Status: AC
  Filled 2022-12-12: qty 2

## 2022-12-12 MED ORDER — DEXAMETHASONE SODIUM PHOSPHATE 10 MG/ML IJ SOLN
INTRAMUSCULAR | Status: DC | PRN
  Administered 2022-12-12: 4 mg via INTRAVENOUS

## 2022-12-12 MED ORDER — CEFAZOLIN SODIUM-DEXTROSE 2-4 GM/100ML-% IV SOLN
INTRAVENOUS | Status: AC
  Filled 2022-12-12: qty 100

## 2022-12-12 MED ORDER — PROPOFOL 500 MG/50ML IV EMUL
INTRAVENOUS | Status: DC | PRN
  Administered 2022-12-12: 35 ug/kg/min via INTRAVENOUS

## 2022-12-12 MED ORDER — LIDOCAINE HCL 2 % IJ SOLN
INTRAMUSCULAR | Status: DC | PRN
  Administered 2022-12-12: 10 mL

## 2022-12-12 MED ORDER — ONDANSETRON HCL 4 MG/2ML IJ SOLN
INTRAMUSCULAR | Status: AC
  Filled 2022-12-12: qty 2

## 2022-12-12 MED ORDER — GLYCOPYRROLATE PF 0.2 MG/ML IJ SOSY
PREFILLED_SYRINGE | INTRAMUSCULAR | Status: DC | PRN
  Administered 2022-12-12: .2 mg via INTRAVENOUS

## 2022-12-12 MED ORDER — ONDANSETRON HCL 4 MG/2ML IJ SOLN
INTRAMUSCULAR | Status: DC | PRN
  Administered 2022-12-12: 4 mg via INTRAVENOUS

## 2022-12-12 MED ORDER — BUPIVACAINE HCL (PF) 0.5 % IJ SOLN
INTRAMUSCULAR | Status: DC | PRN
  Administered 2022-12-12: 10 mL

## 2022-12-12 MED ORDER — DEXAMETHASONE SODIUM PHOSPHATE 10 MG/ML IJ SOLN
INTRAMUSCULAR | Status: AC
  Filled 2022-12-12: qty 1

## 2022-12-12 MED ORDER — OXYCODONE-ACETAMINOPHEN 5-325 MG PO TABS: 1.0000 | ORAL_TABLET | ORAL | 0 refills | Status: DC | PRN

## 2022-12-12 MED ORDER — 0.9 % SODIUM CHLORIDE (POUR BTL) OPTIME
TOPICAL | Status: DC | PRN
  Administered 2022-12-12: 500 mL

## 2022-12-12 MED ORDER — ACETAMINOPHEN 500 MG PO TABS
ORAL_TABLET | ORAL | Status: AC
  Filled 2022-12-12: qty 2

## 2022-12-12 SURGICAL SUPPLY — 76 items
0.45 K-Wire IMPLANT
APL PRP STRL LF DISP 70% ISPRP (MISCELLANEOUS) ×1
BLADE AVERAGE 25X9 (BLADE) ×2 IMPLANT
BLADE MINI RND TIP GREEN BEAV (BLADE) IMPLANT
BLADE SURG 15 STRL LF DISP TIS (BLADE) ×4 IMPLANT
BLADE SURG 15 STRL SS (BLADE) ×3
BNDG CMPR 5X4 KNIT ELC UNQ LF (GAUZE/BANDAGES/DRESSINGS) ×2
BNDG CMPR 9X4 STRL LF SNTH (GAUZE/BANDAGES/DRESSINGS) ×1
BNDG ELASTIC 3X5.8 VLCR STR LF (GAUZE/BANDAGES/DRESSINGS) ×2 IMPLANT
BNDG ELASTIC 4INX 5YD STR LF (GAUZE/BANDAGES/DRESSINGS) ×4 IMPLANT
BNDG ESMARK 4X9 LF (GAUZE/BANDAGES/DRESSINGS) ×2 IMPLANT
BNDG GAUZE DERMACEA FLUFF 4 (GAUZE/BANDAGES/DRESSINGS) ×2 IMPLANT
BNDG GZE DERMACEA 4 6PLY (GAUZE/BANDAGES/DRESSINGS) ×1
BUR OVAL CARBIDE 4.0 (BURR) IMPLANT
CHLORAPREP W/TINT 26 (MISCELLANEOUS) ×2 IMPLANT
COVER BACK TABLE 60X90IN (DRAPES) ×2 IMPLANT
COVER K-WIRE PIN .062/1.6 (PIN)
CUFF TOURN SGL QUICK 18X4 (TOURNIQUET CUFF) ×2 IMPLANT
CUFF TOURN SGL QUICK 34 (TOURNIQUET CUFF)
CUFF TRNQT CYL 34X4.125X (TOURNIQUET CUFF) ×2 IMPLANT
DRAPE C-ARM 35X43 STRL (DRAPES) ×2 IMPLANT
DRAPE EXTREMITY T 121X128X90 (DISPOSABLE) ×2 IMPLANT
DRAPE IMP U-DRAPE 54X76 (DRAPES) ×2 IMPLANT
DRAPE OEC MINIVIEW 54X84 (DRAPES) ×2 IMPLANT
DRAPE SHEET LG 3/4 BI-LAMINATE (DRAPES) ×2 IMPLANT
DRAPE SURG 17X23 STRL (DRAPES) IMPLANT
DRAPE U-SHAPE 47X51 STRL (DRAPES) ×2 IMPLANT
ELECT REM PT RETURN 9FT ADLT (ELECTROSURGICAL) ×1
ELECTRODE REM PT RTRN 9FT ADLT (ELECTROSURGICAL) ×2 IMPLANT
GAUZE 4X4 16PLY ~~LOC~~+RFID DBL (SPONGE) ×2 IMPLANT
GAUZE SPONGE 4X4 12PLY STRL (GAUZE/BANDAGES/DRESSINGS) ×2 IMPLANT
GAUZE XEROFORM 1X8 LF (GAUZE/BANDAGES/DRESSINGS) ×2 IMPLANT
GLOVE BIO SURGEON STRL SZ7.5 (GLOVE) IMPLANT
GLOVE BIO SURGEON STRL SZ8 (GLOVE) IMPLANT
GLOVE BIOGEL PI IND STRL 8 (GLOVE) IMPLANT
GOWN STRL REUS W/TWL LRG LVL3 (GOWN DISPOSABLE) ×4 IMPLANT
GOWN STRL REUS W/TWL XL LVL3 (GOWN DISPOSABLE) ×2 IMPLANT
IMPL FINGER JT FGT SZ 40T (Joint) IMPLANT
IMPLANT FINGER JT FGT SZ 40T (Joint) ×1 IMPLANT
K-WIRE CAPS STERILE WHITE .045 (WIRE) IMPLANT
K-WIRE DBL END TROCAR 6X.045 (WIRE) ×1
K-WIRE DBL END TROCAR 6X.062 (WIRE)
KIT TURNOVER CYSTO (KITS) ×2 IMPLANT
KWIRE DBL END TROCAR 6X.045 (WIRE) IMPLANT
KWIRE DBL END TROCAR 6X.062 (WIRE) IMPLANT
KWIRE PIN COVER STL .045/1.1 W (WIRE)
NDL HYPO 25X1 1.5 SAFETY (NEEDLE) ×2 IMPLANT
NDL SAFETY ECLIP 18X1.5 (MISCELLANEOUS) IMPLANT
NEEDLE HYPO 25X1 1.5 SAFETY (NEEDLE) ×1 IMPLANT
NS IRRIG 1000ML POUR BTL (IV SOLUTION) IMPLANT
NS IRRIG 500ML POUR BTL (IV SOLUTION) IMPLANT
PACK BASIN DAY SURGERY FS (CUSTOM PROCEDURE TRAY) ×2 IMPLANT
PADDING CAST ABS COTTON 4X4 ST (CAST SUPPLIES) ×4 IMPLANT
PENCIL SMOKE EVACUATOR (MISCELLANEOUS) ×2 IMPLANT
PIN GUARD 1.14MM WHITE STERILE (WIRE) IMPLANT
PIN GUARD 1.57MM GREEN STERILE (PIN) IMPLANT
SLEEVE SCD COMPRESS KNEE MED (STOCKING) ×2 IMPLANT
SPLINT FIBERGLASS 4X30 (CAST SUPPLIES) ×2 IMPLANT
STAPLER VISISTAT (STAPLE) IMPLANT
STAPLER VISISTAT 35W (STAPLE) IMPLANT
STOCKINETTE 6  STRL (DRAPES) ×1
STOCKINETTE 6 STRL (DRAPES) ×2 IMPLANT
SUCTION FRAZIER HANDLE 10FR (MISCELLANEOUS) ×1
SUCTION TUBE FRAZIER 10FR DISP (MISCELLANEOUS) ×2 IMPLANT
SUT MNCRL AB 3-0 PS2 18 (SUTURE) ×2 IMPLANT
SUT MNCRL AB 4-0 PS2 18 (SUTURE) ×2 IMPLANT
SUT MON AB 5-0 PS2 18 (SUTURE) IMPLANT
SUT PROLENE 4 0 PS 2 18 (SUTURE) ×2 IMPLANT
SUT VIC AB 3-0 PS2 18 (SUTURE) ×1
SUT VIC AB 3-0 PS2 18XBRD (SUTURE) IMPLANT
SUT VIC AB 4-0 PS2 27 (SUTURE) ×2 IMPLANT
SYR BULB EAR ULCER 3OZ GRN STR (SYRINGE) ×2 IMPLANT
SYR CONTROL 10ML LL (SYRINGE) IMPLANT
TOWEL OR 17X24 6PK STRL BLUE (TOWEL DISPOSABLE) ×2 IMPLANT
TUBE CONNECTING 12X1/4 (SUCTIONS) ×2 IMPLANT
UNDERPAD 30X36 HEAVY ABSORB (UNDERPADS AND DIAPERS) ×2 IMPLANT

## 2022-12-12 NOTE — Anesthesia Procedure Notes (Signed)
Procedure Name: LMA Insertion Date/Time: 12/12/2022 2:02 PM  Performed by: Bishop Limbo, CRNAPre-anesthesia Checklist: Patient identified, Emergency Drugs available, Suction available and Patient being monitored Patient Re-evaluated:Patient Re-evaluated prior to induction Oxygen Delivery Method: Circle System Utilized Preoxygenation: Pre-oxygenation with 100% oxygen Induction Type: IV induction Ventilation: Mask ventilation without difficulty LMA: LMA inserted LMA Size: 4.0 Number of attempts: 1 Placement Confirmation: positive ETCO2 Tube secured with: Tape Dental Injury: Teeth and Oropharynx as per pre-operative assessment

## 2022-12-12 NOTE — Discharge Instructions (Addendum)
No acetaminophen/Tylenol until after 6:15pm today if needed for pain.   No ibuprofen, Advil, Aleve, Motrin, ketorolac, meloxicam, naproxen, or other NSAIDS until after 6:15pm today if needed for pain.     Post Anesthesia Home Care Instructions  Activity: Get plenty of rest for the remainder of the day. A responsible individual must stay with you for 24 hours following the procedure.  For the next 24 hours, DO NOT: -Drive a car -Advertising copywriter -Drink alcoholic beverages -Take any medication unless instructed by your physician -Make any legal decisions or sign important papers.  Meals: Start with liquid foods such as gelatin or soup. Progress to regular foods as tolerated. Avoid greasy, spicy, heavy foods. If nausea and/or vomiting occur, drink only clear liquids until the nausea and/or vomiting subsides. Call your physician if vomiting continues.  Special Instructions/Symptoms: Your throat may feel dry or sore from the anesthesia or the breathing tube placed in your throat during surgery. If this causes discomfort, gargle with warm salt water. The discomfort should disappear within 24 hours.

## 2022-12-12 NOTE — Anesthesia Preprocedure Evaluation (Addendum)
Anesthesia Evaluation  Patient identified by MRN, date of birth, ID band Patient awake    Reviewed: Allergy & Precautions, H&P , NPO status , Patient's Chart, lab work & pertinent test results  Airway Mallampati: II  TM Distance: >3 FB Neck ROM: Full    Dental no notable dental hx. (+) Teeth Intact, Poor Dentition, Dental Advisory Given   Pulmonary neg pulmonary ROS, former smoker   Pulmonary exam normal breath sounds clear to auscultation       Cardiovascular hypertension, Pt. on medications negative cardio ROS Normal cardiovascular exam Rhythm:Regular Rate:Normal  ECG: NSR, rate 62   Neuro/Psych  PSYCHIATRIC DISORDERS    Schizophrenia  negative neurological ROS  negative psych ROS   GI/Hepatic negative GI ROS, Neg liver ROS,GERD  Medicated and Controlled,,(+) Hepatitis -, C  Endo/Other  negative endocrine ROSdiabetes, Insulin Dependent    Renal/GU negative Renal ROS  negative genitourinary   Musculoskeletal negative musculoskeletal ROS (+) Arthritis , Osteoarthritis,    Abdominal   Peds negative pediatric ROS (+)  Hematology negative hematology ROS (+) Blood dyscrasia, anemia HLD   Anesthesia Other Findings   Reproductive/Obstetrics negative OB ROS                             Anesthesia Physical Anesthesia Plan  ASA: 3  Anesthesia Plan: General   Post-op Pain Management: Tylenol PO (pre-op)* and Celebrex PO (pre-op)*   Induction: Intravenous  PONV Risk Score and Plan: 2  Airway Management Planned: LMA  Additional Equipment: None  Intra-op Plan:   Post-operative Plan: Extubation in OR  Informed Consent: I have reviewed the patients History and Physical, chart, labs and discussed the procedure including the risks, benefits and alternatives for the proposed anesthesia with the patient or authorized representative who has indicated his/her understanding and acceptance.      Dental advisory given  Plan Discussed with: CRNA and Anesthesiologist  Anesthesia Plan Comments:         Anesthesia Quick Evaluation

## 2022-12-12 NOTE — Anesthesia Postprocedure Evaluation (Signed)
Anesthesia Post Note  Patient: Kayla Gregory  Procedure(s) Performed: [name] IMPLANT (Left: Toe) CAPSULOTOMY METATARSOPHALANGEAL SECOND (Left: Toe) HAMMER TOE CORRECTION SECOND (Left: Toe)     Patient location during evaluation: PACU Anesthesia Type: General Level of consciousness: awake and alert Pain management: pain level controlled Vital Signs Assessment: post-procedure vital signs reviewed and stable Respiratory status: spontaneous breathing, nonlabored ventilation, respiratory function stable and patient connected to nasal cannula oxygen Cardiovascular status: blood pressure returned to baseline and stable Postop Assessment: no apparent nausea or vomiting Anesthetic complications: no  No notable events documented.  Last Vitals:  Vitals:   12/12/22 1545 12/12/22 1600  BP: (!) 174/91 (!) 187/101  Pulse: 64 (!) 56  Resp: 10 11  Temp:  (!) 36.4 C  SpO2: 96% 95%    Last Pain:  Vitals:   12/12/22 1600  TempSrc:   PainSc: 0-No pain                 Trevor Iha

## 2022-12-12 NOTE — Op Note (Addendum)
OPERATIVE REPORT Patient name: Kayla Gregory MRN: [id number] DOB: 08-19-1961  DOS: 12/12/22  Preop Dx: Hallux limitus left foot.  Hammertoe second digit left foot Postop Dx: same  Procedure:  1.  Left great toe arthroplasty with implant 2.  Hammertoe arthroplasty with MTP capsulotomy second left  Surgeon: Felecia Shelling DPM  Anesthesia: General anesthesia with 50-50 mixture of 2% lidocaine plain with 0.5% Marcaine plain totaling 15 mL infiltrated in the patient's left foot  Hemostasis: Calf tourniquet inflated to a pressure of after esmarch exsanguination   EBL: Minimal mL Materials: Integra great toe Silastic implant Injectables: None Pathology: None  Condition: The patient tolerated the procedure and anesthesia well. No complications noted or reported   Justification for procedure: The patient is a 62 y.o. female who presents today for surgical correction of symptomatic hallux limitus to the left great toe as well as hammertoe contracture of the left second digit. All conservative modalities of been unsuccessful in providing any sort of satisfactory alleviation of symptoms with the patient. The patient was told benefits as well as possible side effects of the surgery. The patient consented for surgical correction. The patient consent form was reviewed. All patient questions were answered. No guarantees were expressed or implied. The patient and the surgeon both signed the patient consent form with the witness present and placed in the patient's chart.   Procedure in Detail: The patient was brought to the operating room, placed in the operating table in the supine position at which time an aseptic scrub and drape were performed about the patient's respective lower extremity after anesthesia was induced as described above. Attention was then directed to the surgical area where procedure number one commenced.  Procedure #1: Left great toe arthroplasty with implant A 4 cm  linear longitudinal skin incision was planned and made overlying the first MTP of the left foot.  Incision carried down to the level of joint capsule with care taken to cut clamp ligate and retract away all small neurovascular structures traversing the incision site as well as identification of the EHL tendon which was retracted and preserved Linear capsulotomy was performed to expose the underlying MTP joint.  The capsular tissue was carefully reflected away using a #15 scalpel to allow exposure of both the metatarsal head and base of the proximal phalanx for the ensuing osteotomies in preparation for the arthroplasty and implant.  After appropriate visualization of the joint, osteotomies were performed using the cut guide which was placed within the joint.  The head of the first metatarsal as well as the base of the proximal phalanx were then removed in toto.  Rotary bur was utilized to create a hole within the medullary canal of the metatarsal and phalanx.  A power rasp and broach was then utilized to create the appropriate deficit for proper seating of the implant into both the metatarsal and phalanx.  Irrigation was then utilized.  An Integra Silastic implant was then placed within the joint and seated appropriately as well as verified by intraoperative x-ray fluoroscopy.  Routine layered primary closure was then achieved with primary closure of the joint capsule overlying the implant using 3-0 Vicryl suture followed by 4-0 Vicryl suture for subcutaneous tissue and 4-0 Prolene suture for superficial skin edges.  Procedure #2: Hammertoe arthroplasty with MTP capsulotomy second digit left A 4 cm linear longitudinal skin incision was planned and made overlying the MTP and PIP of the second digit left foot.  Incision was carried down  to the level of the EDL tendon at which time a Z-plasty tenotomy was performed to expose the underlying joints.  Attention was initially directed to the MTP joint where a dorsal  capsulotomy was performed using #15 scalpel.  A McGlamery elevator was then inserted and utilized to release the plantar apparatus of the joint.  This seemed to alleviate and correct for the MTP contracture noted preoperatively. Attention was then directed more distally where a #64 Beaver blade was utilized to reflect away all ligamentous and capsular tissue around the hypertrophic head of the proximal phalanx.  An osteotomy was created at the surgical phalangeal neck of the proximal phalanx and the head of the phalanx was removed in toto.  Irrigation was utilized and a 0.045 inch percutaneous K wire was then driven through the respective ray of the toe to allow for rectus alignment during postoperative healing.  This was verified by intraoperative x-ray fluoroscopy which was satisfactory.  The percutaneous portion of the K wire was bent dorsal at a 90 degree angle and cut with a synthetic ball cap placed of the percutaneous portion Additional irrigation was utilized in preparation for routine layered primary closure.  3-0 Vicryl was utilized to reapproximate the opposing ends of the Z-plasty tenotomy which was reapproximated under normal physiologic tension.  4-0 Vicryl suture was utilized to reapproximate subcutaneous tissue followed by 4-0 Prolene suture to reapproximate superficial skin edges.  Dry sterile compressive dressings were then applied to all previously mentioned incision sites about the patient's lower extremity. The tourniquet which was used for hemostasis was deflated. All normal neurovascular responses including pink color and warmth returned all the digits of patient's lower extremity.  The patient was then transferred from the operating room to the recovery room having tolerated the procedure and anesthesia well. All vital signs are stable. After a brief stay in the recovery room the patient was discharged with postoperative orders placed.    IMPRESSION: Good potential for  healing  Felecia Shelling, DPM [facility]  Dr. Felecia Shelling, DPM    [address] [place], [place] [address]                Office [phone]  Fax [phone]

## 2022-12-12 NOTE — Transfer of Care (Signed)
Immediate Anesthesia Transfer of Care Note  Patient: Kayla Gregory  Procedure(s) Performed: Kayla Gregory IMPLANT (Left: Toe) CAPSULOTOMY METATARSOPHALANGEAL SECOND (Left: Toe) HAMMER TOE CORRECTION SECOND (Left: Toe)  Patient Location: PACU  Anesthesia Type:General  Level of Consciousness: awake, alert , oriented, and patient cooperative  Airway & Oxygen Therapy: Patient Spontanous Breathing  Post-op Assessment: Report given to RN and Post -op Vital signs reviewed and stable  Post vital signs: Reviewed and stable  Last Vitals:  Vitals Value Taken Time  BP 177/86 12/12/22 1533  Temp    Pulse 66 12/12/22 1539  Resp 8 12/12/22 1539  SpO2 94 % 12/12/22 1539  Vitals shown include unvalidated device data.  Last Pain:  Vitals:   12/12/22 1206  TempSrc: Oral  PainSc: 0-No pain      Patients Stated Pain Goal: 6 (12/12/22 1206)  Complications: No notable events documented.

## 2022-12-12 NOTE — Brief Op Note (Signed)
12/12/2022  3:26 PM  PATIENT:  Kayla Gregory  62 y.o. 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  PROCEDURE:  Procedure(s): KELLER BUNION IMPLANT (Left) CAPSULOTOMY METATARSOPHALANGEAL SECOND (Left) HAMMER TOE CORRECTION SECOND (Left)  SURGEON:  Surgeon(s) and Role:    Felecia Shelling, DPM - Primary  PHYSICIAN ASSISTANT:   ASSISTANTS: none   ANESTHESIA:   local and general  EBL:  2 mL   BLOOD ADMINISTERED:none  DRAINS: none   LOCAL MEDICATIONS USED:  MARCAINE   , LIDOCAINE , and Amount: 15 ml  SPECIMEN:  No Specimen  DISPOSITION OF SPECIMEN:  N/A  COUNTS:  YES  TOURNIQUET:   Total Tourniquet Time Documented: Calf (Left) - 55 minutes Total: Calf (Left) - 55 minutes   DICTATION: .Reubin Milan Dictation  PLAN OF CARE: Discharge to home after PACU  PATIENT DISPOSITION:  PACU - hemodynamically stable.   Delay start of Pharmacological VTE agent (>24hrs) due to surgical blood loss or risk of bleeding: not applicable  Felecia Shelling, DPM [facility]  Dr. Felecia Shelling, DPM    [address] [place], [place] [address]                Office [phone]  Fax [phone]

## 2022-12-12 NOTE — Interval H&P Note (Signed)
History and Physical Interval Note:  12/12/2022 1:49 PM  Kayla Gregory  has presented today for surgery, with the diagnosis of Capsulitis of metatarsophalangeal joint of left foot Hallux limitus left Hammertoe of second toe of left foot.  The various methods of treatment have been discussed with the patient and family. After consideration of risks, benefits and other options for treatment, the patient has consented to  Procedure(s): KELLER BUNION IMPLANT (Left) CAPSULOTOMY METATARSOPHALANGEAL SECOND (Left) HAMMER TOE CORRECTION SECOND (Left) as a surgical intervention.  The patient's history has been reviewed, patient examined, no change in status, stable for surgery.  I have reviewed the patient's chart and labs.  Questions were answered to the patient's satisfaction.     Felecia Shelling

## 2022-12-16 ENCOUNTER — Encounter (HOSPITAL_BASED_OUTPATIENT_CLINIC_OR_DEPARTMENT_OTHER): Payer: Self-pay | Admitting: Podiatry

## 2022-12-18 ENCOUNTER — Ambulatory Visit (INDEPENDENT_AMBULATORY_CARE_PROVIDER_SITE_OTHER): Payer: Medicaid Other

## 2022-12-18 ENCOUNTER — Ambulatory Visit (INDEPENDENT_AMBULATORY_CARE_PROVIDER_SITE_OTHER): Payer: Medicaid Other | Admitting: Podiatry

## 2022-12-18 DIAGNOSIS — M205X1 Other deformities of toe(s) (acquired), right foot: Secondary | ICD-10-CM

## 2022-12-18 NOTE — Progress Notes (Signed)
Chief Complaint  Patient presents with   Routine Post Op    POV #1 DOS 12/12/2022 LT GREAT TOE ARTHROPLASTY W/IMPLANT LT, HAMMERTOE ARTHROPLASTY W/CAPSULOTOMY LT, Patient denies any N/V/F/C/SOB, patient rates the pain 10 out of 10, patient is taking Tylenol for the pain, surgical shoe, X-Rays done today     Subjective:  Patient presents today status post left great toe arthroplasty with implant and hammertoe correction of the second digit left foot.  Patient states originally the pain was 10/10 but over the past few days it has subsided a bit.  WBAT in the surgical shoe as instructed.  Dressings are clean dry and intact  Past Medical History:  Diagnosis Date   Anemia associated with chronic renal failure    Arthritis    CKD (chronic kidney disease), stage V [facility])    nephrologist--- dr Charline Bills. Janit Pagan ([facility] [facility]  not on diablysis;   followd by [facility] clinic to get on transplant list   Diabetic nephropathy (HCC)    Environmental and seasonal allergies    GERD (gastroesophageal reflux disease)    History of hepatitis C    per pt treated 2016 in [place] w/ harvoni (hx IV drug use) ,  in epic 05/ 2017 confirmed lab work, cured   Hyperlipidemia associated with type 2 diabetes mellitus (HCC)    Hypertension associated with diabetes (HCC)    cardiac work-up in care everywhere for transplant ;    echo and stress echo 11-14-2022   Right wrist deformity    per pt due to previous wrist fracture in 2012 and she took cast off before healed   Schizophrenia (HCC)    Type 2 diabetes mellitus (HCC) 2005   followed by pcp;   (12-04-2022  per pt checks blood surgar daily am fasting,  average 98--118)   Wears glasses     Past Surgical History:  Procedure Laterality Date   BUNIONECTOMY Left 12/12/2022   Procedure: KELLER BUNION IMPLANT;  Surgeon: Felecia Shelling, DPM;  Location: [facility];  Service: Podiatry;  Laterality: Left;   CAPSULOTOMY METATARSOPHALANGEAL Left  12/12/2022   Procedure: CAPSULOTOMY METATARSOPHALANGEAL SECOND;  Surgeon: Felecia Shelling, DPM;  Location: [facility];  Service: Podiatry;  Laterality: Left;   CATARACT EXTRACTION W/ INTRAOCULAR LENS  IMPLANT, BILATERAL Bilateral    approx 2020   COLONOSCOPY  06/2020   ECTOPIC PREGNANCY SURGERY     1980s   FOOT SURGERY Bilateral 03/05/2017   @ [facility] by dr b. Logan Bores;   right 1st bunionectomy/  right 2nd hammertoe correction;  left and right 5th hammer toe correction   HAMMER TOE SURGERY Left 12/12/2022   Procedure: HAMMER TOE CORRECTION SECOND;  Surgeon: Felecia Shelling, DPM;  Location: [facility];  Service: Podiatry;  Laterality: Left;   PTOSIS REPAIR Left 12/25/2017   Procedure: INTERNAL PTOSIS REPAIR LEFT EYE;  Surgeon: Floydene Flock, MD;  Location: [facility];  Service: Ophthalmology;  Laterality: Left;   TONSILLECTOMY     age 62   TOTAL HIP ARTHROPLASTY Left 2011    Allergies  Allergen Reactions   Metformin And Related Diarrhea and Other (See Comments)    GI symptoms including abdominal pain and diarrhea - Pt not interested in taking again   Norvasc [Amlodipine] Itching   Omeprazole Rash    Objective/Physical Exam Neurovascular status intact.  Incision well coapted with sutures intact. No sign of infectious process noted. No dehiscence. No active bleeding noted.  Moderate edema noted to the surgical extremity.  Overall well-healing surgical foot  Radiographic Exam LT foot 12/18/2022:  Silastic implant is in good alignment and well-seated within the first MTP.  Percutaneous fixation pin intact.  Good alignment of the second ray.  Arthroplasty of the PIPJ noted.  Assessment: 1. s/p left great toe arthroplasty with implant.  Hammertoe repair second left. DOS: 12/12/2022  -Patient evaluated.  X-rays reviewed -Dressings changed.  Clean dry and intact x 1 week -Continue WBAT surgical shoe. -Return to clinic 1 week   Felecia Shelling, DPM [facility]  Dr. Felecia Shelling, DPM    [address] [place], [place] [address]                Office [phone]  Fax [phone]

## 2022-12-23 NOTE — Progress Notes (Unsigned)
L 

## 2022-12-25 ENCOUNTER — Ambulatory Visit (INDEPENDENT_AMBULATORY_CARE_PROVIDER_SITE_OTHER): Payer: Medicaid Other | Admitting: Podiatry

## 2022-12-25 DIAGNOSIS — M205X1 Other deformities of toe(s) (acquired), right foot: Secondary | ICD-10-CM

## 2022-12-25 NOTE — Progress Notes (Signed)
Chief Complaint  Patient presents with   Post-op Follow-up    POV #2 DOS 12/12/2022 LT GREAT TOE ARTHROPLASTY W/IMPLANT LT, HAMMERTOE ARTHROPLASTY W/CAPSULOTOMY LT, PATIENT STATED SHE'S DOING WELL, SAYS SHE'S ABLE TO MOVE HER TOES    Subjective:  Patient presents today status post left great toe arthroplasty with implant and hammertoe correction of the second digit left foot.  Patient continues to do well.  Minimal pain or tenderness.  She continues to be WBAT surgical shoe.  No new complaints  Past Medical History:  Diagnosis Date   Anemia associated with chronic renal failure    Arthritis    CKD (chronic kidney disease), stage V [facility])    nephrologist--- dr Charline Bills. Janit Pagan ([facility] [facility]  not on diablysis;   followd by [facility] clinic to get on transplant list   Diabetic nephropathy (HCC)    Environmental and seasonal allergies    GERD (gastroesophageal reflux disease)    History of hepatitis C    per pt treated 2016 in [place] w/ harvoni (hx IV drug use) ,  in epic 05/ 2017 confirmed lab work, cured   Hyperlipidemia associated with type 2 diabetes mellitus (HCC)    Hypertension associated with diabetes (HCC)    cardiac work-up in care everywhere for transplant ;    echo and stress echo 11-14-2022   Right wrist deformity    per pt due to previous wrist fracture in 2012 and she took cast off before healed   Schizophrenia (HCC)    Type 2 diabetes mellitus (HCC) 2005   followed by pcp;   (12-04-2022  per pt checks blood surgar daily am fasting,  average 98--118)   Wears glasses     Past Surgical History:  Procedure Laterality Date   BUNIONECTOMY Left 12/12/2022   Procedure: KELLER BUNION IMPLANT;  Surgeon: Felecia Shelling, DPM;  Location: [facility];  Service: Podiatry;  Laterality: Left;   CAPSULOTOMY METATARSOPHALANGEAL Left 12/12/2022   Procedure: CAPSULOTOMY METATARSOPHALANGEAL SECOND;  Surgeon: Felecia Shelling, DPM;  [facility];  Service: Podiatry;  Laterality: Left;   CATARACT EXTRACTION W/ INTRAOCULAR LENS  IMPLANT, BILATERAL Bilateral    approx 2020   COLONOSCOPY  06/2020   ECTOPIC PREGNANCY SURGERY     1980s   FOOT SURGERY Bilateral 03/05/2017   @ [facility] by dr b. Logan Bores;   right 1st bunionectomy/  right 2nd hammertoe correction;  left and right 5th hammer toe correction   HAMMER TOE SURGERY Left 12/12/2022   Procedure: HAMMER TOE CORRECTION SECOND;  Surgeon: Felecia Shelling, DPM;  Location: [facility];  Service: Podiatry;  Laterality: Left;   PTOSIS REPAIR Left 12/25/2017   Procedure: INTERNAL PTOSIS REPAIR LEFT EYE;  Surgeon: Floydene Flock, MD;  Location: [facility];  Service: Ophthalmology;  Laterality: Left;   TONSILLECTOMY     age 62   TOTAL HIP ARTHROPLASTY Left 2011    Allergies  Allergen Reactions   Metformin And Related Diarrhea and Other (See Comments)    GI symptoms including abdominal pain and diarrhea - Pt not interested in taking again   Norvasc [Amlodipine] Itching   Omeprazole Rash    Objective/Physical Exam Neurovascular status intact.  Incision well coapted with sutures intact. No sign of infectious process noted. No dehiscence. No active bleeding noted.  Significantly improved minimal edema noted to the surgical extremity.  Overall well-healing surgical foot  Radiographic Exam LT foot 12/18/2022:  Silastic implant is  in good alignment and well-seated within the first MTP.  Percutaneous fixation pin intact.  Good alignment of the second ray.  Arthroplasty of the PIPJ noted.  Assessment: 1. s/p left great toe arthroplasty with implant.  Hammertoe repair second left. DOS: 12/12/2022  -Patient evaluated.  Sutures removed -Patient may begin washing and showering and getting the foot wet -Continue WBAT surgical shoe -Return to clinic 2 weeks percutaneous pin removal and follow-up x-ray   Felecia Shelling, DPM [facility]  Dr. Felecia Shelling, DPM    [address] [place], [place] [address]                Office [phone]  Fax [phone]

## 2022-12-27 ENCOUNTER — Encounter: Payer: Self-pay | Admitting: Student

## 2022-12-27 ENCOUNTER — Ambulatory Visit (INDEPENDENT_AMBULATORY_CARE_PROVIDER_SITE_OTHER): Payer: Medicaid Other | Admitting: Student

## 2022-12-27 ENCOUNTER — Other Ambulatory Visit: Payer: Self-pay

## 2022-12-27 VITALS — BP 112/70 | HR 63 | Wt 143.0 lb

## 2022-12-27 DIAGNOSIS — E1169 Type 2 diabetes mellitus with other specified complication: Secondary | ICD-10-CM | POA: Insufficient documentation

## 2022-12-27 DIAGNOSIS — E7800 Pure hypercholesterolemia, unspecified: Secondary | ICD-10-CM | POA: Insufficient documentation

## 2022-12-27 DIAGNOSIS — J329 Chronic sinusitis, unspecified: Secondary | ICD-10-CM | POA: Diagnosis not present

## 2022-12-27 DIAGNOSIS — N184 Chronic kidney disease, stage 4 (severe): Secondary | ICD-10-CM

## 2022-12-27 DIAGNOSIS — E1100 Type 2 diabetes mellitus with hyperosmolarity without nonketotic hyperglycemic-hyperosmolar coma (NKHHC): Secondary | ICD-10-CM | POA: Diagnosis not present

## 2022-12-27 DIAGNOSIS — Z794 Long term (current) use of insulin: Secondary | ICD-10-CM | POA: Diagnosis not present

## 2022-12-27 MED ORDER — ATORVASTATIN CALCIUM 40 MG PO TABS: 40.0000 mg | ORAL_TABLET | Freq: Every day | ORAL | 3 refills | Status: DC

## 2022-12-27 MED ORDER — FLUTICASONE PROPIONATE 50 MCG/ACT NA SUSP
2.0000 | Freq: Every day | NASAL | 6 refills | Status: DC
Start: 2022-12-27 — End: 2024-01-24

## 2022-12-27 NOTE — Assessment & Plan Note (Signed)
Patient open to trying Flonase nasal spray.  Prescribed 1 to 2 puffs per nostril daily to help with chronic sinus drainage

## 2022-12-27 NOTE — Progress Notes (Deleted)
    SUBJECTIVE:   CHIEF COMPLAINT / HPI:   Kayla Gregory is a 62 y.o. female  presenting for   PERTINENT  PMH / PSH: Reviewed and updated   OBJECTIVE:   BP 112/70   Pulse 63   Wt 143 lb (64.9 kg)   SpO2 98%   BMI 27.02 kg/m   ***-appearing, no acute distress Cardio: Regular rate, *** rhythm, no murmurs on exam. Pulm: Clear, no wheezing, no crackles. No increased work of breathing Abdominal: bowel sounds present, soft, non-tender, non-distended Extremities: no peripheral edema    ASSESSMENT/PLAN:   No problem-specific Assessment & Plan notes found for this encounter.     Glendale Chard, DO [facility]

## 2022-12-27 NOTE — Patient Instructions (Signed)
I am prescribing Flonase nasal spray for congestion:   Adults:  - 2 sprays in each nostril once daily, scheduled or as needed - if symptoms are controlled after 1 week, consider reducing to 1 spray in each nostril once daily, scheduled or as needed  - maximum dose: 2 sprays per nostril per day

## 2022-12-27 NOTE — Assessment & Plan Note (Signed)
Continue 10 units Lantus daily.  As kidney disease progresses will most likely need to titrate insulin further

## 2022-12-27 NOTE — Assessment & Plan Note (Signed)
Appears clinically stable without uremic symptoms.  Will obtain a BMP today at patient request.  She has an appointment with her nephrologist scheduled for next week.

## 2022-12-27 NOTE — Progress Notes (Signed)
    SUBJECTIVE:   CHIEF COMPLAINT / HPI:   Kayla Gregory is a 62 y.o. female  presenting for follow up of her chronic conditions.   T2DM: -Stable on 10 units insulin daily -Scheduled to see ophthalmologist on 5/15  CKD 5: -Still has good urine output, denies uremic type symptoms  Chronic sinusitis: -Was evaluated by ENT and refused nasal spray at the time -Still having chronic drainage and congestion and is more open to trying nasal spray  PERTINENT  PMH / PSH: Reviewed and updated   OBJECTIVE:   BP 112/70   Pulse 63   Wt 143 lb (64.9 kg)   SpO2 98%   BMI 27.02 kg/m   Well-appearing, no acute distress Cardio: Regular rate, regular rhythm, no murmurs on exam. Pulm: Clear, no wheezing, no crackles. No increased work of breathing Abdominal: bowel sounds present, soft, non-tender, non-distended Extremities: no peripheral edema    ASSESSMENT/PLAN:   DM (diabetes mellitus) (HCC) Continue 10 units Lantus daily.  As kidney disease progresses will most likely need to titrate insulin further  CKD (chronic kidney disease), stage IV (HCC) Appears clinically stable without uremic symptoms.  Will obtain a BMP today at patient request.  She has an appointment with her nephrologist scheduled for next week.  Chronic sinusitis Patient open to trying Flonase nasal spray.  Prescribed 1 to 2 puffs per nostril daily to help with chronic sinus drainage     Glendale Chard, DO [facility]

## 2022-12-28 ENCOUNTER — Emergency Department (HOSPITAL_COMMUNITY): Payer: Medicaid Other

## 2022-12-28 ENCOUNTER — Emergency Department (HOSPITAL_COMMUNITY)
Admission: EM | Admit: 2022-12-28 | Discharge: 2022-12-28 | Disposition: A | Discharge status: Home / Self Care | Payer: Medicaid Other | Attending: Emergency Medicine | Admitting: Emergency Medicine

## 2022-12-28 ENCOUNTER — Telehealth: Payer: Self-pay | Admitting: Family Medicine

## 2022-12-28 DIAGNOSIS — R0789 Other chest pain: Secondary | ICD-10-CM | POA: Insufficient documentation

## 2022-12-28 DIAGNOSIS — Z7982 Long term (current) use of aspirin: Secondary | ICD-10-CM | POA: Diagnosis not present

## 2022-12-28 DIAGNOSIS — Z79899 Other long term (current) drug therapy: Secondary | ICD-10-CM | POA: Diagnosis not present

## 2022-12-28 DIAGNOSIS — N185 Chronic kidney disease, stage 5: Secondary | ICD-10-CM | POA: Insufficient documentation

## 2022-12-28 DIAGNOSIS — Z794 Long term (current) use of insulin: Secondary | ICD-10-CM | POA: Diagnosis not present

## 2022-12-28 DIAGNOSIS — I120 Hypertensive chronic kidney disease with stage 5 chronic kidney disease or end stage renal disease: Secondary | ICD-10-CM | POA: Diagnosis not present

## 2022-12-28 LAB — BASIC METABOLIC PANEL
Anion gap: 14 (ref 5–15)
BUN/Creatinine Ratio: 11 — ABNORMAL LOW (ref 12–28)
BUN: 63 mg/dL — ABNORMAL HIGH (ref 8–27)
BUN: 65 mg/dL — ABNORMAL HIGH (ref 8–23)
CO2: 23 mmol/L (ref 20–29)
CO2: 28 mmol/L (ref 22–32)
Calcium: 11.6 mg/dL — ABNORMAL HIGH (ref 8.7–10.3)
Calcium: 11.9 mg/dL — ABNORMAL HIGH (ref 8.9–10.3)
Chloride: 90 mmol/L — ABNORMAL LOW (ref 96–106)
Chloride: 95 mmol/L — ABNORMAL LOW (ref 98–111)
Creatinine, Ser: 5.75 mg/dL — ABNORMAL HIGH (ref 0.57–1.00)
Creatinine, Ser: 5.93 mg/dL — ABNORMAL HIGH (ref 0.44–1.00)
GFR, Estimated: 7 mL/min — ABNORMAL LOW (ref >60)
Glucose, Bld: 240 mg/dL — ABNORMAL HIGH (ref 70–99)
Glucose: 590 mg/dL (ref 70–99)
Potassium: 4.2 mmol/L (ref 3.5–5.2)
Potassium: 4.3 mmol/L (ref 3.5–5.1)
Sodium: 132 mmol/L — ABNORMAL LOW (ref 134–144)
Sodium: 137 mmol/L (ref 135–145)
eGFR: 8 mL/min/{1.73_m2} — ABNORMAL LOW (ref >59)

## 2022-12-28 LAB — I-STAT VENOUS BLOOD GAS, ED
Acid-base deficit: 5 mmol/L — ABNORMAL HIGH (ref 0.0–2.0)
Bicarbonate: 19.8 mmol/L — ABNORMAL LOW (ref 20.0–28.0)
Calcium, Ion: 1.34 mmol/L (ref 1.15–1.40)
HCT: 19 % — ABNORMAL LOW (ref 36.0–46.0)
Hemoglobin: 6.5 g/dL — CL (ref 12.0–15.0)
O2 Saturation: 50 %
Potassium: 3.9 mmol/L (ref 3.5–5.1)
Sodium: 134 mmol/L — ABNORMAL LOW (ref 135–145)
TCO2: 21 mmol/L — ABNORMAL LOW (ref 22–32)
pCO2, Ven: 32.7 mmHg — ABNORMAL LOW (ref 44–60)
pH, Ven: 7.392 (ref 7.25–7.43)
pO2, Ven: 27 mmHg — CL (ref 32–45)

## 2022-12-28 LAB — CBC
HCT: 31.6 % — ABNORMAL LOW (ref 36.0–46.0)
Hemoglobin: 10.9 g/dL — ABNORMAL LOW (ref 12.0–15.0)
MCH: 29.8 pg (ref 26.0–34.0)
MCHC: 34.5 g/dL (ref 30.0–36.0)
MCV: 86.3 fL (ref 80.0–100.0)
Platelets: 227 10*3/uL (ref 150–400)
RBC: 3.66 MIL/uL — ABNORMAL LOW (ref 3.87–5.11)
RDW: 11.9 % (ref 11.5–15.5)
WBC: 6 10*3/uL (ref 4.0–10.5)
nRBC: 0 % (ref 0.0–0.2)

## 2022-12-28 LAB — TROPONIN I (HIGH SENSITIVITY)
Troponin I (High Sensitivity): 6 ng/L (ref <18)
Troponin I (High Sensitivity): 6 ng/L (ref <18)

## 2022-12-28 LAB — HEPATIC FUNCTION PANEL
ALT: 20 U/L (ref 0–44)
AST: 22 U/L (ref 15–41)
Albumin: 3.2 g/dL — ABNORMAL LOW (ref 3.5–5.0)
Alkaline Phosphatase: 66 U/L (ref 38–126)
Bilirubin, Direct: <0.1 mg/dL (ref 0.0–0.2)
Total Bilirubin: 0.4 mg/dL (ref 0.3–1.2)
Total Protein: 6.8 g/dL (ref 6.5–8.1)

## 2022-12-28 LAB — MAGNESIUM: Magnesium: 2.7 mg/dL — ABNORMAL HIGH (ref 1.7–2.4)

## 2022-12-28 MED ORDER — INSULIN GLARGINE-YFGN 100 UNIT/ML ~~LOC~~ SOLN
10.0000 [IU] | Freq: Once | SUBCUTANEOUS | Status: AC
  Administered 2022-12-28: 10 [IU] via SUBCUTANEOUS
  Filled 2022-12-28: qty 0.1

## 2022-12-28 MED ORDER — LACTATED RINGERS IV BOLUS
1000.0000 mL | Freq: Once | INTRAVENOUS | Status: AC
  Administered 2022-12-28: 1000 mL via INTRAVENOUS

## 2022-12-28 MED ORDER — ACETAMINOPHEN 500 MG PO TABS
1000.0000 mg | ORAL_TABLET | Freq: Once | ORAL | Status: AC
  Administered 2022-12-28: 1000 mg via ORAL
  Filled 2022-12-28: qty 2

## 2022-12-28 MED ORDER — LIDOCAINE 5 % EX PTCH
1.0000 | MEDICATED_PATCH | CUTANEOUS | Status: DC
  Administered 2022-12-28: 1 via TRANSDERMAL
  Filled 2022-12-28: qty 1

## 2022-12-28 NOTE — Telephone Encounter (Signed)
Received page regarding critical lab values, glucose 590  BUN 63 Cr 5.75 eGFR 8 Na 132 Cl 90 Ca 11.6 Otherwise labs within normal limits  Called patient. Reports feeling well other than sinus issues. Denies abdominal pain, nausea, vomiting. Reports CBG prior to sleeping was 98.  I had her check her CBG right now, reported as 107.  Labs without evidence of DKA but could indicate HHS. However, patient asymptomatic and with normal CBG readings currently. Also with increase in her Cr but eGFR is relatively stable. I don't think there is any indication for emergent evaluation at this time. Will forward to PCP.

## 2022-12-28 NOTE — ED Triage Notes (Addendum)
Pt c/o L sided CP and lower back pain for one month. Pt also endorses new dizziness and DOE. Pt states she was seen by PCP yesterday and told she had some abnormal labwork.

## 2022-12-28 NOTE — ED Provider Notes (Signed)
[facility] AT [facility] Provider Note   CSN: [id number] Arrival date & time: 12/28/22  1434     History {Add pertinent medical, surgical, social history, OB history to HPI:1} Chief Complaint  Patient presents with   Chest Pain    Kayla Gregory is a 62 y.o. female. 62 year old female history of CKD 5, hypertension, hyperlipidemia, GERD, recent surgery on her left foot for hallux limitus presenting for chest pain, fatigue.  Patient states for the last month or so she has had intermittent left-sided chest pain.  Occasionally associated with exertion but not consistently.  She has occasional shortness of breath as well.  No pleuritic pain.  No history of DVT or PE.  Her foot has been healing well from recent surgery.  She has had chronic cough which is unchanged, no fevers or chills.  She has had increased urination and has been drinking plenty of fluids.  She ran out of her Lantus 1 month ago and has had high blood sugar recently.  She is not on other insulin.  She saw her PCP yesterday who did blood work notable for baseline creatinine and blood glucose of 590.  She was sent here for evaluation.  She has not had any abdominal pain.  She has chronic lower back pain without fall or neurologic symptoms.     Chest Pain      Home Medications Prior to Admission medications   Medication Sig Start Date End Date Taking? Authorizing Provider  Accu-Chek Softclix Lancets lancets Use as instructed 10/11/22   Glendale Chard, DO  aspirin EC 81 MG tablet Take 81 mg by mouth daily. Swallow whole.    [provider]  atorvastatin (LIPITOR) 40 MG tablet Take 1 tablet (40 mg total) by mouth daily. 12/27/22   Glendale Chard, DO  benztropine (COGENTIN) 1 MG tablet Take by mouth 2 (two) times daily. Per pt take 4 tabs in am and 1 tab at bedtime    [provider]  Blood Glucose Monitoring Suppl (ACCU-CHEK AVIVA PLUS) w/Device KIT 1 application  by Does not  apply route 4 (four) times daily -  with meals and at bedtime. 07/29/22   Glendale Chard, DO  Blood Pressure Monitoring (BLOOD PRESSURE CUFF) MISC 1 each by Does not apply route daily. 07/29/22   Glendale Chard, DO  calcitRIOL (ROCALTROL) 0.25 MCG capsule Take 0.25 mcg by mouth daily.    [provider]  carboxymethylcellulose (REFRESH PLUS) 0.5 % SOLN Place 1 drop into both eyes 3 (three) times daily as needed.    [provider]  cetirizine (ZYRTEC) 10 MG tablet Take 1 tablet (10 mg total) by mouth daily as needed for allergies. 08/15/22   Bess Kinds, MD  Cholecalciferol (VITAMIN D3) 125 MCG (5000 UT) CAPS Take 1 capsule by mouth daily.    [provider]  cloNIDine (CATAPRES) 0.1 MG tablet Take 0.1 mg by mouth 2 (two) times daily.    [provider]  fluticasone (FLONASE) 50 MCG/ACT nasal spray Place 2 sprays into both nostrils daily. 12/27/22   Glendale Chard, DO  gabapentin (NEURONTIN) 100 MG capsule Take 1 capsule (100 mg total) by mouth at bedtime as needed. 12/11/22   Alicia Amel, MD  glucose blood (ACCU-CHEK AVIVA PLUS) test strip Use to check blood glucose up to four times daily 10/11/22   Glendale Chard, DO  insulin glargine (LANTUS SOLOSTAR) 100 UNIT/ML Solostar Pen Inject 10 Units into the skin daily. Patient taking differently:  Inject 10 Units into the skin daily. 10/11/22   Glendale Chard, DO  Insulin Pen Needle (B-D ULTRAFINE III SHORT PEN) 31G X 8 MM MISC USE DAILY AS DIRECTED 10/11/22   Glendale Chard, DO  Lancet Device MISC Check blood glucose once a day in the AM. 04/13/20   Sandre Kitty, MD  Lancet Devices [facility]) lancets Use as instructed 11/22/16   Diallo, Lilia Argue, MD  pantoprazole (PROTONIX) 40 MG tablet TAKE 1 TABLET(40 MG) BY MOUTH DAILY Patient taking differently: Take 40 mg by mouth daily. 08/06/22   Glendale Chard, DO  ranitidine (ZANTAC) 150 MG capsule Take 150 mg by mouth 2 (two) times daily. 05/23/15   [provider]  valsartan-hydrochlorothiazide (DIOVAN-HCT) 160-25 MG tablet TAKE 1 TABLET BY MOUTH DAILY Patient taking differently: Take 1 tablet by mouth daily. 06/19/22   Glendale Chard, DO  ziprasidone (GEODON) 20 MG capsule Take 20 mg by mouth daily with breakfast.    [provider]      Allergies    Metformin and related, Norvasc [amlodipine], and Omeprazole    Review of Systems   Review of Systems  Cardiovascular:  Positive for chest pain.    Physical Exam Updated Vital Signs There were no vitals taken for this visit. Physical Exam Vitals and nursing note reviewed. Exam conducted with a chaperone present.  Constitutional:      General: She is not in acute distress.    Appearance: She is well-developed.  HENT:     Head: Normocephalic and atraumatic.     Nose: Nose normal.     Mouth/Throat:     Mouth: Mucous membranes are dry.     Pharynx: Oropharynx is clear.  Eyes:     Conjunctiva/sclera: Conjunctivae normal.  Cardiovascular:     Rate and Rhythm: Normal rate and regular rhythm.     Pulses:          Radial pulses are 2+ on the right side and 2+ on the left side.       Dorsalis pedis pulses are 2+ on the right side and 2+ on the left side.     Heart sounds: No murmur heard. Pulmonary:     Effort: Pulmonary effort is normal. No respiratory distress.     Breath sounds: Normal breath sounds.  Abdominal:     Palpations: Abdomen is soft.     Tenderness: There is no abdominal tenderness.  Musculoskeletal:        General: No swelling.     Cervical back: Normal range of motion and neck supple.     Comments: Left-sided chest wall over the ribs tender without skin changes.  Chest pain is reproduced with palpation.  Skin:    General: Skin is warm and dry.     Capillary Refill: Capillary refill takes less than 2 seconds.  Neurological:     General: No focal deficit present.     Mental Status: She is alert and oriented to person, place, and time. Mental status is at  baseline.     Cranial Nerves: No cranial nerve deficit.     Sensory: No sensory deficit.     Motor: No weakness.  Psychiatric:        Mood and Affect: Mood normal.     ED Results / Procedures / Treatments   Labs (all labs ordered are listed, but only abnormal results are displayed) Labs Reviewed  BASIC METABOLIC PANEL  CBC  TROPONIN I (HIGH SENSITIVITY)    EKG None  Radiology No results found.  Procedures Procedures  {Document cardiac monitor, telemetry assessment procedure when appropriate:1}  Medications Ordered in ED Medications - No data to display  ED Course/ Medical Decision Making/ A&P Clinical Course as of 12/28/22 1742  Sat Dec 28, 2022  1551 EKG normal sinus rhythm, normals are unremarkable, no significant ST or T wave abnormalities.  No signs of ischemia. [JD]    Clinical Course User Index [JD] [place] Reek, MD   {   Click here for ABCD2, HEART and other calculatorsREFRESH Note before signing :1}                          Medical Decision Making Amount and/or Complexity of Data Reviewed Labs: ordered. Radiology: ordered.  Risk OTC drugs. Prescription drug management.   62 year old female presenting for chest pain, fatigue, hyperglycemia on recent labs.  Vital signs reviewed notable for mild bradycardia.  On exam she is well-appearing but does appear somewhat dehydrated.  She was quite hyperglycemic yesterday without signs of DKA.  Will obtain lab work here to evaluate.  She ran out of her Lantus about a month ago, and I suspect her hyperglycemia is contributing to dehydration and her fatigue.  Will rehydrate with IV fluids.  She does report left-sided chest pain.  It is reproducible on exam and seems to be more likely musculoskeletal.  EKG is unremarkable.  Will obtain delta troponin for evaluation.  She has no shortness of breath, hypoxia, tachycardia, or DVT symptoms, low concern for PE.  No signs or symptoms of aortic dissection.  I reviewed her  chest x-ray, she does not have pneumonia, pneumothorax, pulmonary edema, or mediastinal widening.  Her BMP here shows improvement in blood sugar from yesterday to 240.  Creatinine is similar to prior and she does have uremia to 65.  Calcium is slightly elevated as well.  CBC shows stable anemia and no leukocytosis.  Blood gas was drawn but appears to be diluted given discordant hemoglobin.  pH is normal.  {Document critical care time when appropriate:1} {Document review of labs and clinical decision tools ie heart score, Chads2Vasc2 etc:1}  {Document your independent review of radiology images, and any outside records:1} {Document your discussion with family members, caretakers, and with consultants:1} {Document social determinants of health affecting pt's care:1} {Document your decision making why or why not admission, treatments were needed:1} Final Clinical Impression(s) / ED Diagnoses Final diagnoses:  None    Rx / DC Orders ED Discharge Orders     None

## 2022-12-28 NOTE — Discharge Instructions (Signed)
You were seen today for chest pain and high blood sugar.  Your lab work showed improvement in your blood sugar, you should continue taking your Lantus 10 units daily as prescribed by your doctor.  Your chest pain is likely musculoskeletal, you can use Tylenol and lidocaine patches over-the-counter for this.  If you develop worsening chest pain, difficulty breathing, or any other new concerning symptoms you should return to the ED.  You should follow-up with your doctor within the next week.

## 2022-12-30 ENCOUNTER — Telehealth: Payer: Self-pay | Admitting: Student

## 2022-12-30 MED ORDER — CALCITRIOL 0.25 MCG PO CAPS: 0.2500 ug | ORAL_CAPSULE | Freq: Every day | ORAL | 0 refills | Status: DC

## 2022-12-30 NOTE — Telephone Encounter (Signed)
Called and schedule patient

## 2022-12-30 NOTE — Telephone Encounter (Signed)
Called Kayla Gregory to follow up on lab results and recent ED visit. Left HIPAA compliant voicemail. Patient will need follow up in clinic  within 1 week to recheck BMP and follow up on chest wall pain.   Will forward to front office to help with scheduling.   Glendale Chard, DO [facility], PGY-1 12/30/22 8:38 AM

## 2022-12-31 ENCOUNTER — Other Ambulatory Visit: Payer: Self-pay | Admitting: Podiatry

## 2022-12-31 DIAGNOSIS — M205X1 Other deformities of toe(s) (acquired), right foot: Secondary | ICD-10-CM

## 2023-01-01 ENCOUNTER — Ambulatory Visit (INDEPENDENT_AMBULATORY_CARE_PROVIDER_SITE_OTHER): Payer: Medicaid Other | Admitting: Podiatry

## 2023-01-01 ENCOUNTER — Ambulatory Visit (INDEPENDENT_AMBULATORY_CARE_PROVIDER_SITE_OTHER): Payer: Medicaid Other

## 2023-01-01 DIAGNOSIS — M7752 Other enthesopathy of left foot: Secondary | ICD-10-CM

## 2023-01-01 DIAGNOSIS — Z9889 Other specified postprocedural states: Secondary | ICD-10-CM

## 2023-01-01 NOTE — Progress Notes (Signed)
Chief Complaint  Patient presents with   Routine Post Op    POV #3 DOS 12/12/2022 LT GREAT TOE ARTHROPLASTY W/IMPLANT LT, HAMMERTOE ARTHROPLASTY W/CAPSULOTOMY LT, patient denies any pain, NO N/V/F/C/SOB, X-Rays done today     Subjective:  Patient presents today status post left great toe arthroplasty with implant and hammertoe correction of the second digit left foot.  Patient continues to do well.  Minimal pain or tenderness.  She continues to be WBAT surgical shoe.  No new complaints  Past Medical History:  Diagnosis Date   Anemia associated with chronic renal failure    Arthritis    Environmental and seasonal allergies    GERD (gastroesophageal reflux disease)    Hyperlipidemia associated with type 2 diabetes mellitus (HCC)    Hypertension associated with diabetes (HCC)    cardiac work-up in care everywhere for transplant ;    echo and stress echo 11-14-2022   Right wrist deformity    per pt due to previous wrist fracture in 2012 and she took cast off before healed   Schizophrenia [facility])     Past Surgical History:  Procedure Laterality Date   BUNIONECTOMY Left 12/12/2022   Procedure: KELLER BUNION IMPLANT;  Surgeon: Felecia Shelling, DPM;  Location: [facility];  Service: Podiatry;  Laterality: Left;   CAPSULOTOMY METATARSOPHALANGEAL Left 12/12/2022   Procedure: CAPSULOTOMY METATARSOPHALANGEAL SECOND;  Surgeon: Felecia Shelling, DPM;  Location: [facility];  Service: Podiatry;  Laterality: Left;   CATARACT EXTRACTION W/ INTRAOCULAR LENS  IMPLANT, BILATERAL Bilateral    approx 2020   COLONOSCOPY  06/2020   ECTOPIC PREGNANCY SURGERY     1980s   FOOT SURGERY Bilateral 03/05/2017   @ [facility] by dr b. Logan Bores;   right 1st bunionectomy/  right 2nd hammertoe correction;  left and right 5th hammer toe correction   HAMMER TOE SURGERY Left 12/12/2022   Procedure: HAMMER TOE CORRECTION SECOND;  Surgeon: Felecia Shelling, DPM;  Location: [facility];   Service: Podiatry;  Laterality: Left;   PTOSIS REPAIR Left 12/25/2017   Procedure: INTERNAL PTOSIS REPAIR LEFT EYE;  Surgeon: Floydene Flock, MD;  Location: [facility] OR;  Service: Ophthalmology;  Laterality: Left;   TONSILLECTOMY     age 62   TOTAL HIP ARTHROPLASTY Left 2011    Allergies  Allergen Reactions   Metformin And Related Diarrhea and Other (See Comments)    GI symptoms including abdominal pain and diarrhea - Pt not interested in taking again   Norvasc [Amlodipine] Itching   Omeprazole Rash    Objective/Physical Exam Neurovascular status intact.  Incisions nicely healed.  No sign of infectious process noted. No dehiscence. No active bleeding noted.  Significantly improved minimal edema noted to the surgical extremity.  Overall well-healing surgical foot  Radiographic Exam LT foot 01/01/2023:  Unchanged.  Silastic implant is in good alignment and well-seated within the first MTP.  Percutaneous fixation pin intact.  Good alignment of the second ray.  Arthroplasty of the PIPJ noted.  Assessment: 1. s/p left great toe arthroplasty with implant.  Hammertoe repair second left. DOS: 12/12/2022  -Patient evaluated.  Percutaneous fixation pin removed -Patient may now begin to transition out of the surgical shoe into good supportive tennis shoes and sneakers -Return to clinic 1 month follow-up x-rays   Felecia Shelling, DPM [facility]  Dr. Felecia Shelling, DPM    [address]  [place], [place] [address]                Office [phone]  Fax [phone]

## 2023-01-03 ENCOUNTER — Ambulatory Visit (INDEPENDENT_AMBULATORY_CARE_PROVIDER_SITE_OTHER): Payer: Medicaid Other | Admitting: Student

## 2023-01-03 VITALS — BP 118/60 | HR 60 | Ht 61.0 in | Wt 146.0 lb

## 2023-01-03 DIAGNOSIS — Z794 Long term (current) use of insulin: Secondary | ICD-10-CM | POA: Diagnosis not present

## 2023-01-03 DIAGNOSIS — E1100 Type 2 diabetes mellitus with hyperosmolarity without nonketotic hyperglycemic-hyperosmolar coma (NKHHC): Secondary | ICD-10-CM | POA: Diagnosis not present

## 2023-01-03 DIAGNOSIS — N184 Chronic kidney disease, stage 4 (severe): Secondary | ICD-10-CM

## 2023-01-03 NOTE — Progress Notes (Signed)
    SUBJECTIVE:   CHIEF COMPLAINT / HPI:   Kayla Gregory is a 62 y.o. female  presenting for follow up of worsening kidney function and ED presentation of chest wall pain.   CKD 5 not on dialysis: Was seen by nephrologist on 5/13 who recommended stopping calcitriol and vitamin D3 due to hypercalcemia.  He also recommended a low-salt diet, exercise, and weight loss.  He instructed her to follow-up in 3 months.  He discussed peritoneal dialysis, but she is not ready for placement of her catheter yet.    T2DM: Currently on 10 units in the morning.  She is checking her sugars daily, her sugar this morning was 76.  In the ED she has had sugars above 600.   PERTINENT  PMH / PSH: Reviewed and updated   OBJECTIVE:   BP 118/60   Pulse 60   Ht 5\' 1"  (1.549 m)   Wt 146 lb (66.2 kg)   SpO2 98%   BMI 27.59 kg/m   Well-appearing, no acute distress Cardio: Regular rate, regular rhythm, no murmurs on exam. Pulm: Clear, no wheezing, no crackles. No increased work of breathing Abdominal: bowel sounds present, soft, non-tender, non-distended Extremities: no peripheral edema  Neuro: alert and oriented x3, speech normal in content, no facial asymmetry, strength intact and equal bilaterally in UE and LE, pupils equal and reactive to light.   ASSESSMENT/PLAN:   DM (diabetes mellitus) (HCC) Low blood sugar for morning fasting this morning but others appear to be within the normal range.  Will check BMP and will have her bring in a blood sugar log to determine insulin adjustment.  CKD (chronic kidney disease), stage IV (HCC) No uremic symptoms on exam.  Will recheck BMP for kidney function. Medication list updated to include nephrology recommendations of discontinuing calcitriol and vitamin D3.     Glendale Chard, DO [facility]

## 2023-01-03 NOTE — Assessment & Plan Note (Signed)
Low blood sugar for morning fasting this morning but others appear to be within the normal range.  Will check BMP and will have her bring in a blood sugar log to determine insulin adjustment.

## 2023-01-03 NOTE — Assessment & Plan Note (Addendum)
No uremic symptoms on exam.  Will recheck BMP for kidney function. Medication list updated to include nephrology recommendations of discontinuing calcitriol and vitamin D3.

## 2023-01-03 NOTE — Patient Instructions (Addendum)
It was great to see you today!   Today we addressed: Kidney function: I am checking your kidney function today and will call you with results.   Future Appointments  Date Time Provider Department Center  02/03/2023  8:45 AM Felecia Shelling, DPM [facility]    Please arrive 15 minutes before your appointment to ensure smooth check in process.    Please call the clinic at [phone] if your symptoms worsen or you have any concerns.  Thank you for allowing me to participate in your care, Dr. Glendale Chard [facility]

## 2023-01-04 LAB — BASIC METABOLIC PANEL
BUN/Creatinine Ratio: 10 — ABNORMAL LOW (ref 12–28)
BUN: 54 mg/dL — ABNORMAL HIGH (ref 8–27)
CO2: 23 mmol/L (ref 20–29)
Calcium: 10.9 mg/dL — ABNORMAL HIGH (ref 8.7–10.3)
Chloride: 92 mmol/L — ABNORMAL LOW (ref 96–106)
Creatinine, Ser: 5.48 mg/dL — ABNORMAL HIGH (ref 0.57–1.00)
Glucose: 84 mg/dL (ref 70–99)
Potassium: 4.2 mmol/L (ref 3.5–5.2)
Sodium: 132 mmol/L — ABNORMAL LOW (ref 134–144)
eGFR: 8 mL/min/{1.73_m2} — ABNORMAL LOW (ref >59)

## 2023-01-08 ENCOUNTER — Encounter: Payer: Medicaid Other | Admitting: Podiatry

## 2023-01-09 ENCOUNTER — Telehealth: Payer: Self-pay

## 2023-01-09 NOTE — Telephone Encounter (Signed)
Patient calls and LVM on nurse line regarding creatinine results.   Called patient back. Discussed mychart message from Dr. Hyacinth Meeker.   Patient has no further questions at this time.   Veronda Prude, RN

## 2023-01-21 ENCOUNTER — Telehealth: Payer: Self-pay | Admitting: Podiatry

## 2023-01-21 NOTE — Telephone Encounter (Signed)
Pt called and was needing to r/s appt for 3rd post op to sooner than 6.17 and I explained that as of now we did not having anything earlier. She is going out of town and will not be back until 7.28.2024..  I offered to add to waitlist incase we got a cancellation and she said to change appt to August. I have her scheduled 8.5.2024.

## 2023-02-03 ENCOUNTER — Encounter: Payer: Medicaid Other | Admitting: Podiatry

## 2023-03-01 ENCOUNTER — Other Ambulatory Visit: Payer: Self-pay | Admitting: Student

## 2023-03-01 DIAGNOSIS — G6289 Other specified polyneuropathies: Secondary | ICD-10-CM

## 2023-03-20 ENCOUNTER — Encounter (HOSPITAL_COMMUNITY): Payer: Self-pay

## 2023-03-20 ENCOUNTER — Ambulatory Visit (HOSPITAL_COMMUNITY)
Admission: EM | Admit: 2023-03-20 | Discharge: 2023-03-20 | Disposition: A | Discharge status: Home / Self Care | Payer: MEDICAID | Attending: Emergency Medicine | Admitting: Emergency Medicine

## 2023-03-20 DIAGNOSIS — Z20822 Contact with and (suspected) exposure to covid-19: Secondary | ICD-10-CM | POA: Insufficient documentation

## 2023-03-20 NOTE — ED Provider Notes (Signed)
[facility]    CSN: [id number] Arrival date & time: 03/20/23  1124      History   Chief Complaint Chief Complaint  Patient presents with   COVID test    HPI Kayla Gregory is a 62 y.o. female.  Here for covid test Was in [place] for the last month, travel by train and around lots of people. Also +exposure at her church.  She is not having any symptoms, just wanted to be checked No fever, congestion, cough, sore throat, shortness of breath, abd pain, NVD.  Past Medical History:  Diagnosis Date   Anemia associated with chronic renal failure    Arthritis    Environmental and seasonal allergies    GERD (gastroesophageal reflux disease)    Hyperlipidemia associated with type 2 diabetes mellitus (HCC)    Hypertension associated with diabetes (HCC)    cardiac work-up in care everywhere for transplant ;    echo and stress echo 11-14-2022   Right wrist deformity    per pt due to previous wrist fracture in 2012 and she took cast off before healed   Schizophrenia [facility])     Patient Active Problem List   Diagnosis Date Noted   Hypercholesterolemia 12/27/2022   Chronic sinusitis 10/11/2022   Family history of malignant neoplasm of gastrointestinal tract 09/26/2020   Personal history of colonic polyps 09/26/2020   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   Peripheral neuropathy 10/04/2019   CKD (chronic kidney disease), stage IV (HCC) 04/06/2019   Allergic rhinitis 12/14/2018   Chronic left shoulder pain 12/27/2016   Stable angina 12/22/2015   GERD (gastroesophageal reflux disease) 12/22/2015   Chronic hepatitis C (HCC) 12/22/2015   DM (diabetes mellitus) (HCC) 03/31/2014   Essential hypertension 03/31/2014    Past Surgical History:  Procedure Laterality Date   BUNIONECTOMY Left 12/12/2022   Procedure: KELLER BUNION IMPLANT;  Surgeon: Felecia Shelling, DPM;  Location: [facility];  Service: Podiatry;  Laterality: Left;   CAPSULOTOMY METATARSOPHALANGEAL Left 12/12/2022    Procedure: CAPSULOTOMY METATARSOPHALANGEAL SECOND;  Surgeon: Felecia Shelling, DPM;  Location: [facility];  Service: Podiatry;  Laterality: Left;   CATARACT EXTRACTION W/ INTRAOCULAR LENS  IMPLANT, BILATERAL Bilateral    approx 2020   COLONOSCOPY  06/2020   ECTOPIC PREGNANCY SURGERY     1980s   FOOT SURGERY Bilateral 03/05/2017   @ [facility] by dr b. Logan Bores;   right 1st bunionectomy/  right 2nd hammertoe correction;  left and right 5th hammer toe correction   HAMMER TOE SURGERY Left 12/12/2022   Procedure: HAMMER TOE CORRECTION SECOND;  Surgeon: Felecia Shelling, DPM;  Location: [facility] [place];  Service: Podiatry;  Laterality: Left;   PTOSIS REPAIR Left 12/25/2017   Procedure: INTERNAL PTOSIS REPAIR LEFT EYE;  Surgeon: Floydene Flock, MD;  [facility];  Service: Ophthalmology;  Laterality: Left;   TONSILLECTOMY     age 46   TOTAL HIP ARTHROPLASTY Left 2011    OB History   No obstetric history on file.      Home Medications    Prior to Admission medications   Medication Sig Start Date End Date Taking? Authorizing Provider  Accu-Chek Softclix Lancets lancets Use as instructed 10/11/22  Yes Glendale Chard, DO  aspirin EC 81 MG tablet Take 81 mg by mouth daily. Swallow whole.   Yes [provider]  atorvastatin (LIPITOR) 40 MG tablet Take 1 tablet (40 mg total) by mouth daily. 12/27/22  Yes Hyacinth Meeker,  Irving Burton, DO  benztropine (COGENTIN) 1 MG tablet Take by mouth 2 (two) times daily. Per pt take 4 tabs in am and 1 tab at bedtime   Yes [provider]  Blood Glucose Monitoring Suppl (ACCU-CHEK AVIVA PLUS) w/Device KIT 1 application  by Does not apply route 4 (four) times daily -  with meals and at bedtime. 07/29/22  Yes Glendale Chard, DO  Blood Pressure Monitoring (BLOOD PRESSURE CUFF) MISC 1 each by Does not apply route daily. 07/29/22  Yes Glendale Chard, DO  carboxymethylcellulose (REFRESH PLUS) 0.5 % SOLN Place 1 drop into both eyes 3 (three) times  daily as needed.   Yes [provider]  cetirizine (ZYRTEC) 10 MG tablet Take 1 tablet (10 mg total) by mouth daily as needed for allergies. 08/15/22  Yes Sowell, Apolinar Junes, MD  cloNIDine (CATAPRES) 0.1 MG tablet Take 0.1 mg by mouth 2 (two) times daily.   Yes [provider]  fluticasone (FLONASE) 50 MCG/ACT nasal spray Place 2 sprays into both nostrils daily. 12/27/22  Yes Glendale Chard, DO  gabapentin (NEURONTIN) 100 MG capsule TAKE 1 CAPSULE(100 MG) BY MOUTH AT BEDTIME AS NEEDED 03/03/23  Yes Glendale Chard, DO  glucose blood (ACCU-CHEK AVIVA PLUS) test strip Use to check blood glucose up to four times daily 10/11/22  Yes Glendale Chard, DO  insulin glargine (LANTUS SOLOSTAR) 100 UNIT/ML Solostar Pen Inject 10 Units into the skin daily. Patient taking differently: Inject 10 Units into the skin daily. 10/11/22  Yes Glendale Chard, DO  Insulin Pen Needle (B-D ULTRAFINE III SHORT PEN) 31G X 8 MM MISC USE DAILY AS DIRECTED 10/11/22  Yes Glendale Chard, DO  Lancet Device MISC Check blood glucose once a day in the AM. 04/13/20  Yes Sandre Kitty, MD  Lancet Devices [facility]) lancets Use as instructed 11/22/16  Yes Diallo, Abdoulaye, MD  pantoprazole (PROTONIX) 40 MG tablet TAKE 1 TABLET(40 MG) BY MOUTH DAILY Patient taking differently: Take 40 mg by mouth daily. 08/06/22  Yes Glendale Chard, DO  ranitidine (ZANTAC) 150 MG capsule Take 150 mg by mouth 2 (two) times daily. 05/23/15  Yes [provider]  valsartan-hydrochlorothiazide (DIOVAN-HCT) 160-25 MG tablet TAKE 1 TABLET BY MOUTH DAILY Patient taking differently: Take 1 tablet by mouth daily. 06/19/22  Yes Glendale Chard, DO  ziprasidone (GEODON) 20 MG capsule Take 20 mg by mouth daily with breakfast.   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     Allergies   Metformin and related, Norvasc [amlodipine], and Omeprazole   Review of Systems Review of Systems As per HPI  Physical Exam Triage Vital Signs ED Triage Vitals  Encounter Vitals Group     BP 03/20/23 1201 (!) 143/76  Systolic BP Percentile --      Diastolic BP Percentile --      Pulse Rate 03/20/23 1201 (!) 58     Resp 03/20/23 1201 17     Temp 03/20/23 1201 98.5 F (36.9 C)     Temp Source 03/20/23 1201 Oral     SpO2 03/20/23 1201 97 %     Weight 03/20/23 1200 154 lb (69.9 kg)     Height --      Head Circumference --      Peak Flow --      Pain Score 03/20/23 1200 0     Pain Loc --      Pain Education --      Exclude from Growth Chart --    No data found.  Updated Vital Signs BP (!) 143/76 (BP Location: Left Arm)   Pulse (!) 58   Temp 98.5 F (36.9 C) (Oral)   Resp 17   Wt 154 lb (69.9 kg)   SpO2 97%   BMI 29.10 kg/m   Physical Exam Vitals and nursing note reviewed.  Constitutional:      General: She is not in acute distress.    Appearance: Normal appearance.  HENT:     Nose: No rhinorrhea.  Cardiovascular:     Rate and Rhythm: Normal rate and regular rhythm.     Heart sounds: Normal heart sounds.  Pulmonary:     Effort: Pulmonary effort is normal.     Breath sounds: Normal breath sounds.  Neurological:     Mental Status: She is alert and oriented to person, place, and time.     UC Treatments / Results  Labs (all labs ordered are listed, but only abnormal results are displayed) Labs Reviewed  SARS CORONAVIRUS  2 (TAT 6-24 HRS)    EKG   Radiology No results found.  Procedures Procedures (including critical care time)  Medications Ordered in UC Medications - No data to display  Initial Impression / Assessment and Plan / UC Course  I have reviewed the triage vital signs and the nursing notes.  Pertinent labs & imaging results that were available during my care of the patient were reviewed by me and considered in my medical decision making (see chart for details).  Covid test pending, call if positive. Patient has active mychart account as well. No symptoms, no questions at this time Can return with any concerns  Final Clinical Impressions(s) / UC Diagnoses   Final diagnoses:  Encounter for laboratory testing for COVID-19 virus     Discharge Instructions      We will call you if your covid test returns positive.  It can take up to 24 hours to result     ED Prescriptions   None    PDMP not reviewed this encounter.   Osby Sweetin, Lurena Joiner, [place] 03/20/23 1239

## 2023-03-20 NOTE — Discharge Instructions (Signed)
We will call you if your covid test returns positive.  It can take up to 24 hours to result

## 2023-03-20 NOTE — ED Triage Notes (Signed)
Pt states that she went out of town last week without a mask. She's not having any sx.

## 2023-03-24 ENCOUNTER — Encounter: Payer: MEDICAID | Admitting: Podiatry

## 2023-03-31 LAB — HM COLONOSCOPY

## 2023-04-07 ENCOUNTER — Ambulatory Visit: Payer: MEDICAID

## 2023-04-07 ENCOUNTER — Encounter: Payer: Self-pay | Admitting: Podiatry

## 2023-04-07 ENCOUNTER — Ambulatory Visit (INDEPENDENT_AMBULATORY_CARE_PROVIDER_SITE_OTHER): Payer: MEDICAID | Admitting: Podiatry

## 2023-04-07 DIAGNOSIS — M205X1 Other deformities of toe(s) (acquired), right foot: Secondary | ICD-10-CM

## 2023-04-07 DIAGNOSIS — M2042 Other hammer toe(s) (acquired), left foot: Secondary | ICD-10-CM

## 2023-04-07 NOTE — Progress Notes (Signed)
Chief Complaint  Patient presents with   Routine Post Op    Rm 10: POV #3 DOS 12/12/2022 LT GREAT TOE ARTHROPLASTY W/IMPLANT LT, HAMMERTOE ARTHROPLASTY W/CAPSULOTOMY LT. Patient states that 2nd right toe still hurts and she cannot bend it.    Subjective:  Patient presents today status post left great toe arthroplasty with implant and hammertoe correction of the second digit left foot.  DOS: 12/12/2022.  Patient continues to have some slight tenderness occasionally to the left second toe.  No pain to the great toe and good range of motion.  She also notices some stiffness to the left second toe as well  Past Medical History:  Diagnosis Date   Anemia associated with chronic renal failure    Arthritis    Environmental and seasonal allergies    GERD (gastroesophageal reflux disease)    Hyperlipidemia associated with type 2 diabetes mellitus (HCC)    Hypertension associated with diabetes (HCC)    cardiac work-up in care everywhere for transplant ;    echo and stress echo 11-14-2022   Right wrist deformity    per pt due to previous wrist fracture in 2012 and she took cast off before healed   Schizophrenia [facility])     Past Surgical History:  Procedure Laterality Date   BUNIONECTOMY Left 12/12/2022   Procedure: KELLER BUNION IMPLANT;  Surgeon: Felecia Shelling, DPM;  Location: [facility];  Service: Podiatry;  Laterality: Left;   CAPSULOTOMY METATARSOPHALANGEAL Left 12/12/2022   Procedure: CAPSULOTOMY METATARSOPHALANGEAL SECOND;  Surgeon: Felecia Shelling, DPM;  Location: [facility] [place];  Service: Podiatry;  Laterality: Left;   CATARACT EXTRACTION W/ INTRAOCULAR LENS  IMPLANT, BILATERAL Bilateral    approx 2020   COLONOSCOPY  06/2020   ECTOPIC PREGNANCY SURGERY     1980s   FOOT SURGERY Bilateral 03/05/2017   @ [facility] by dr b. Logan Bores;   right 1st bunionectomy/  right 2nd hammertoe correction;  left and right 5th hammer toe correction   HAMMER TOE SURGERY Left 12/12/2022    Procedure: HAMMER TOE CORRECTION SECOND;  Surgeon: Felecia Shelling, DPM;  Location: [facility];  Service: Podiatry;  Laterality: Left;   PTOSIS REPAIR Left 12/25/2017   Procedure: INTERNAL PTOSIS REPAIR LEFT EYE;  Surgeon: Floydene Flock, MD;  [facility];  Service: Ophthalmology;  Laterality: Left;   TONSILLECTOMY     age 62   TOTAL HIP ARTHROPLASTY Left 2011    Allergies  Allergen Reactions   Metformin And Related Diarrhea and Other (See Comments)    GI symptoms including abdominal pain and diarrhea - Pt not interested in taking again   Norvasc [Amlodipine] Itching   Omeprazole Rash    Objective/Physical Exam Neurovascular status intact.  Incisions nicely healed.  No sign of infectious process noted.  Good range of motion to the second MTP with no appreciable pain with palpation.  The IPJ's are somewhat stiff and there is some very mild chronic edema noted specifically to the second toe.  Radiographic Exam LT foot 04/07/2023:  Silastic implant good alignment well-seated into the first MTP.  Good alignment of the second ray.  Arthroplasty of the PIPJ of the second digit.  Assessment: 1. s/p left great toe arthroplasty with implant.  Hammertoe repair second left. DOS: 12/12/2022  -Patient evaluated.  X-rays reviewed -Patient may now resume full activity no restrictions -Recommend good supportive tennis shoes and sneakers.  Advised against going barefoot -Explained to the patient that the  second toe will likely always be stiff secondary to the surgery but the mild edema and slight tenderness that she experiences intermittently should slowly resolve over the following several months -Return to clinic as needed  Felecia Shelling, DPM [facility]  Dr. Felecia Shelling, DPM    [address] [place], [place] [address]                Office [phone]  Fax [phone]

## 2023-04-07 NOTE — Addendum Note (Signed)
Addended by: Jonni Sanger on: 04/07/2023 01:32 PM   Modules accepted: Orders

## 2023-04-15 ENCOUNTER — Ambulatory Visit: Payer: MEDICAID | Admitting: Student

## 2023-04-15 ENCOUNTER — Encounter: Payer: Self-pay | Admitting: Student

## 2023-04-15 VITALS — BP 134/70 | HR 56 | Ht 61.0 in | Wt 152.8 lb

## 2023-04-15 DIAGNOSIS — Z794 Long term (current) use of insulin: Secondary | ICD-10-CM

## 2023-04-15 DIAGNOSIS — N184 Chronic kidney disease, stage 4 (severe): Secondary | ICD-10-CM

## 2023-04-15 DIAGNOSIS — E1100 Type 2 diabetes mellitus with hyperosmolarity without nonketotic hyperglycemic-hyperosmolar coma (NKHHC): Secondary | ICD-10-CM | POA: Diagnosis not present

## 2023-04-15 DIAGNOSIS — I10 Essential (primary) hypertension: Secondary | ICD-10-CM | POA: Diagnosis not present

## 2023-04-15 DIAGNOSIS — L300 Nummular dermatitis: Secondary | ICD-10-CM

## 2023-04-15 LAB — POCT GLYCOSYLATED HEMOGLOBIN (HGB A1C): HbA1c, POC (controlled diabetic range): 6.5 % (ref 0.0–7.0)

## 2023-04-15 MED ORDER — TRIAMCINOLONE ACETONIDE 0.1 % EX CREA
1.0000 | TOPICAL_CREAM | Freq: Two times a day (BID) | CUTANEOUS | 0 refills | Status: AC
Start: 2023-04-15 — End: ?

## 2023-04-15 MED ORDER — LANTUS SOLOSTAR 100 UNIT/ML ~~LOC~~ SOPN: 5.0000 [IU] | PEN_INJECTOR | Freq: Every day | SUBCUTANEOUS | 3 refills | Status: DC

## 2023-04-15 NOTE — Assessment & Plan Note (Signed)
Prescribed triamcinolone cream for eczema flare, instructed to use 1-2 times per day with Vaseline or Aquaphor

## 2023-04-15 NOTE — Patient Instructions (Signed)
It was great to see you today!   Your A1c is 6.5 today. I am reducing your insulin to 5 units daily.   I also prescribed a steroid cream for your eczema. Apply this once a day after your shower and place Aquaphor or Vaseline on top.   No future appointments.  Please arrive 15 minutes before your appointment to ensure smooth check in process.    Please call the clinic at [phone] if your symptoms worsen or you have any concerns.  Thank you for allowing me to participate in your care, Dr. Glendale Chard [facility]

## 2023-04-15 NOTE — Assessment & Plan Note (Addendum)
A1c today 6.5, with low morning fasting sugars will decrease LAI to 5 units daily. Encouraged her to continue checking sugars daily at home. Patient reports she is up to date with her diabetic eye exam, encouraged her to fax over paperwork to our office.

## 2023-04-15 NOTE — Assessment & Plan Note (Signed)
Bmp checked last week, will hold additional lab work today.

## 2023-04-15 NOTE — Assessment & Plan Note (Signed)
Stable. Continue Diovan 160-25 mg, Clonidine 0.1 mg BID

## 2023-04-15 NOTE — Progress Notes (Signed)
    SUBJECTIVE:   CHIEF COMPLAINT / HPI:   Kayla Gregory is a 62 y.o. female  presenting for follow up of CKD5 not on dialysis, T2DM, HTN.   CKD5: recently saw nephrology last week. She is still not ready to start dialysis. No uremic symptoms on exam.   T2DM:  Last A1c 4 months ago 6.2 Fasting morning glucose <105  Currently compliant with 10 units LAI daily   HTN:  Checks BP daily with pressures very well controlled SBP <130. Denies SE to the medications.   Eczema:  She reports intermittent itching and dark spots from her eczema. She would like additional cream to help control the symptoms before the winter months.   PERTINENT  PMH / PSH: Reviewed and updated   OBJECTIVE:   BP 134/70   Pulse (!) 56   Ht 5\' 1"  (1.549 m)   Wt 152 lb 12.8 oz (69.3 kg)   SpO2 97%   BMI 28.87 kg/m   Well-appearing, no acute distress Cardio: Regular rate, regular rhythm, no murmurs on exam. Pulm: Clear, no wheezing, no crackles. No increased work of breathing Abdominal: bowel sounds present, soft, non-tender, non-distended Extremities: no peripheral edema  Neuro: alert and oriented x3, speech normal in content, no facial asymmetry, strength intact and equal bilaterally in UE and LE, pupils equal and reactive to light.  Psych:  Cognition and judgment appear intact. Alert, communicative  and cooperative with normal attention span and concentration. No apparent delusions, illusions, hallucinations      04/15/2023    8:31 AM 01/03/2023    9:01 AM 12/27/2022    9:10 AM  PHQ9 SCORE ONLY  PHQ-9 Total Score 0 0 0      ASSESSMENT/PLAN:   Essential hypertension Stable. Continue Diovan 160-25 mg, Clonidine 0.1 mg BID  DM (diabetes mellitus) (HCC) A1c today 6.5, with low morning fasting sugars will decrease LAI to 5 units daily. Encouraged her to continue checking sugars daily at home. Patient reports she is up to date with her diabetic eye exam, encouraged her to fax over paperwork to our  office.   Discoid eczema Prescribed triamcinolone cream for eczema flare, instructed to use 1-2 times per day with Vaseline or Aquaphor   CKD (chronic kidney disease), stage IV (HCC) Bmp checked last week, will hold additional lab work today.      Glendale Chard, DO [facility]

## 2023-05-05 ENCOUNTER — Other Ambulatory Visit: Payer: Self-pay | Admitting: Student

## 2023-05-05 DIAGNOSIS — Z1231 Encounter for screening mammogram for malignant neoplasm of breast: Secondary | ICD-10-CM

## 2023-05-06 ENCOUNTER — Telehealth: Payer: Self-pay | Admitting: Student

## 2023-05-06 DIAGNOSIS — R2689 Other abnormalities of gait and mobility: Secondary | ICD-10-CM

## 2023-05-06 NOTE — Telephone Encounter (Signed)
Called patient to discuss concern for multiple falls. She reports she has fallen 3 times in the last 2-3 days. She reports now walking with a cane at home. She reports this happening to her before in the past and she was admitted to the hospital for 2 days. She reports they are her regular dizzy spells. She reports that she did not hit her head and she is up and walking normally now. She does not want to be evaluated at the hospital.   She is requesting a home health aide through her [employer]. I offered her physical therapy and she agreed to be enrolled.   Ordered HH and outpatient PT. Patient is adamant that her nephew be the one allowed in her home and to get paid for offering [facility] services.   Glendale Chard, DO [facility], PGY-2 05/06/23 10:16 AM

## 2023-05-06 NOTE — Telephone Encounter (Signed)
Patient would like for Dr. Hyacinth Meeker to call her to discuss issues with her recent falls. Has questions about care givers.   The best call back is [phone]

## 2023-05-12 ENCOUNTER — Telehealth: Payer: Self-pay | Admitting: Student

## 2023-05-12 ENCOUNTER — Other Ambulatory Visit (HOSPITAL_BASED_OUTPATIENT_CLINIC_OR_DEPARTMENT_OTHER): Payer: Self-pay

## 2023-05-12 DIAGNOSIS — R296 Repeated falls: Secondary | ICD-10-CM

## 2023-05-12 MED ORDER — INFLUENZA VIRUS VACC SPLIT PF (FLUZONE) 0.5 ML IM SUSY
0.5000 mL | PREFILLED_SYRINGE | Freq: Once | INTRAMUSCULAR | 0 refills | Status: AC
  Filled 2023-05-12: qty 0.5, 1d supply, fill #0

## 2023-05-12 MED ORDER — COMIRNATY 30 MCG/0.3ML IM SUSY
0.3000 mL | PREFILLED_SYRINGE | Freq: Once | INTRAMUSCULAR | 0 refills | Status: AC
  Filled 2023-05-12: qty 0.3, 1d supply, fill #0

## 2023-05-12 NOTE — Telephone Encounter (Signed)
Called Ms. Manson Passey and left HIPAA compliant voicemail.   Unfortunately we do not have a home health agency that will support/pay a family member to take care of her at home. Per our referral coordinator, patient should reach out to their insurance provider or case worker to discuss options.   I have sent a referral to social work to see if they can provide any additional resources for her.   Glendale Chard, DO [facility], PGY-2 05/12/23 3:10 PM

## 2023-05-14 ENCOUNTER — Other Ambulatory Visit: Payer: Self-pay | Admitting: Student

## 2023-05-14 DIAGNOSIS — G6289 Other specified polyneuropathies: Secondary | ICD-10-CM

## 2023-05-20 ENCOUNTER — Other Ambulatory Visit: Payer: Self-pay | Admitting: Student

## 2023-05-20 ENCOUNTER — Other Ambulatory Visit: Payer: Self-pay

## 2023-05-20 DIAGNOSIS — I10 Essential (primary) hypertension: Secondary | ICD-10-CM

## 2023-05-21 MED ORDER — CETIRIZINE HCL 10 MG PO TABS: 10.0000 mg | ORAL_TABLET | Freq: Every day | ORAL | 11 refills | Status: DC | PRN

## 2023-05-22 ENCOUNTER — Ambulatory Visit: Payer: MEDICAID | Attending: Family Medicine | Admitting: Physical Therapy

## 2023-05-22 DIAGNOSIS — R2681 Unsteadiness on feet: Secondary | ICD-10-CM | POA: Insufficient documentation

## 2023-05-22 DIAGNOSIS — R2689 Other abnormalities of gait and mobility: Secondary | ICD-10-CM | POA: Insufficient documentation

## 2023-05-22 DIAGNOSIS — M6281 Muscle weakness (generalized): Secondary | ICD-10-CM | POA: Insufficient documentation

## 2023-06-10 ENCOUNTER — Ambulatory Visit: Payer: MEDICAID

## 2023-06-10 ENCOUNTER — Other Ambulatory Visit: Payer: Self-pay

## 2023-06-10 DIAGNOSIS — R2689 Other abnormalities of gait and mobility: Secondary | ICD-10-CM

## 2023-06-10 DIAGNOSIS — R2681 Unsteadiness on feet: Secondary | ICD-10-CM | POA: Diagnosis present

## 2023-06-10 DIAGNOSIS — M6281 Muscle weakness (generalized): Secondary | ICD-10-CM | POA: Diagnosis present

## 2023-06-10 NOTE — Therapy (Signed)
OUTPATIENT PHYSICAL THERAPY LOWER EXTREMITY EVALUATION   Patient Name: Kayla Gregory MRN: [id number] DOB:[birth year], 62 y.o., female Today's Date: 06/10/2023  END OF SESSION:  PT End of Session - 06/10/23 0851     Visit Number 1    Number of Visits 8    Date for PT Re-Evaluation 08/05/23    Authorization Type [employer]    PT Start Time 0803    PT Stop Time 0840    PT Time Calculation (min) 37 min    Activity Tolerance Patient tolerated treatment well    Behavior During Therapy [facility] for tasks assessed/performed             Past Medical History:  Diagnosis Date   GERD (gastroesophageal reflux disease)    Hyperlipidemia associated with type 2 diabetes mellitus (HCC)    Schizophrenia (HCC)    Past Surgical History:  Procedure Laterality Date   BUNIONECTOMY Left 12/12/2022   Procedure: KELLER BUNION IMPLANT;  Surgeon: Felecia Shelling, DPM;  Location: [facility];  Service: Podiatry;  Laterality: Left;   CAPSULOTOMY METATARSOPHALANGEAL Left 12/12/2022   Procedure: CAPSULOTOMY METATARSOPHALANGEAL SECOND;  Surgeon: Felecia Shelling, DPM;  Location: [facility];  Service: Podiatry;  Laterality: Left;   CATARACT EXTRACTION W/ INTRAOCULAR LENS  IMPLANT, BILATERAL Bilateral    approx 2020   COLONOSCOPY  06/2020   ECTOPIC PREGNANCY SURGERY     1980s   FOOT SURGERY Bilateral 03/05/2017   @ [facility] by dr b. Logan Bores;   right 1st bunionectomy/  right 2nd hammertoe correction;  left and right 5th hammer toe correction   HAMMER TOE SURGERY Left 12/12/2022   Procedure: HAMMER TOE CORRECTION SECOND;  Surgeon: Felecia Shelling, DPM;  [facility] [address]  [facility];  Service: Podiatry;  Laterality: Left;   PTOSIS REPAIR Left 12/25/2017   Procedure: INTERNAL PTOSIS REPAIR LEFT EYE;  Surgeon: Floydene Flock, MD;  Location: [facility];  Service: Ophthalmology;  Laterality: Left;   TONSILLECTOMY     age 62   TOTAL HIP ARTHROPLASTY Left 2011   Patient Active Problem  List   Diagnosis Date Noted   Hyperlipidemia associated with type 2 diabetes mellitus (HCC) 12/27/2022   Family history of malignant neoplasm of gastrointestinal tract 09/26/2020   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   Peripheral neuropathy 10/04/2019   CKD (chronic kidney disease), stage IV (HCC) 04/06/2019   Chronic sinusitis 12/14/2018   Chronic left shoulder pain 12/27/2016   Stable angina (HCC) 12/22/2015   GERD (gastroesophageal reflux disease) 12/22/2015   Chronic hepatitis C (HCC) 12/22/2015   DM (diabetes mellitus) (HCC) 03/31/2014   Essential hypertension 03/31/2014    PCP: Glendale Chard, DO   REFERRING PROVIDER: Billey Co, MD   REFERRING DIAG: R26.89 (ICD-10-CM) - Balance problem   THERAPY DIAG:  Unsteadiness on feet  Other abnormalities of gait and mobility  Muscle weakness (generalized)  Rationale for Evaluation and Treatment: Rehabilitation  ONSET DATE: Chronic  SUBJECTIVE:   SUBJECTIVE STATEMENT: Pt presents to PT with reports of L foot/hip pain and balance deficits. Has fallen 3 times in the last 6 months mainly due to losses of balance towards the L. Had L foot surgery in April of this year and has had significant pain in her L foot since. Feels her walking is mainly affected by her L LE pain.    PERTINENT HISTORY: L foot bunionectomy and hammertoe correction, L THA, DM II, HTN, Schizophrenia  PAIN:  Are you having pain?  Yes:  NPRS scale: 10/10 Worst: 10/10 Pain location: L foot Pain description: sharp Aggravating factors: prolonged standing, walking Relieving factors: medication  PRECAUTIONS: None  RED FLAGS: None   WEIGHT BEARING RESTRICTIONS: No  FALLS:  Has patient fallen in last 6 months? Yes. Number of falls - 3 due to losses of balance  LIVING ENVIRONMENT: Lives with: lives alone Lives in: House/apartment Stairs: No Has following equipment at home: Single point cane  OCCUPATION: Retired  PLOF: Independent  PATIENT GOALS:  improve balance and decrease L LE pain  OBJECTIVE:  Note: Objective measures were completed at Evaluation unless otherwise noted.  DIAGNOSTIC FINDINGS: N/A  PATIENT SURVEYS:  FOTO: 42% function; 47% predicted  COGNITION: Overall cognitive status: Within functional limits for tasks assessed     SENSATION: Light touch: Impaired - L foot  EDEMA:  DNT  MUSCLE LENGTH: Hamstrings: Right DNT; Left DNT Maisie Fus test: Right DNT; Left DNT  POSTURE: rounded shoulders and forward head  PALPATION: TTP to L lateral hip  LOWER EXTREMITY MMT:  MMT Right eval Left eval  Hip flexion 5/5 4/5  Hip extension    Hip abduction 5/5 4/5  Hip adduction    Hip internal rotation    Hip external rotation    Knee flexion 5/5 5/5  Knee extension 5/5 5/5  Ankle dorsiflexion    Ankle plantarflexion    Ankle inversion    Ankle eversion     (Blank rows = not tested)  LOWER EXTREMITY SPECIAL TESTS:  DNT  FUNCTIONAL TESTS:  Five Time Sit to Stand: 24 seconds Tandem Stance: 5 seconds each  GAIT: Distance walked: 53ft Assistive device utilized: None Level of assistance: SBA Comments: slight trendelenburg on L   TREATMENT: [facility] Adult PT Treatment:                                                DATE: 06/10/2023 Therapeutic Exercise: Towel scrunch x 60" Toe yoga x 5 Toe splaying x 5 Supine SLR x 5 each S/L hip abd x 5 each Seated clamshell x 10 blue band Tandem stance x 10" each   PATIENT EDUCATION:  Education details: eval findings, FOTO, HEP, POC Person educated: Patient Education method: Explanation, Demonstration, and Handouts Education comprehension: verbalized understanding and returned demonstration  HOME EXERCISE PROGRAM: Access Code: [email or web address] URL: [email or web address] Date: 06/10/2023 Prepared by: Edwinna Areola  Exercises - Towel Scrunches  - 1-2 x daily - 7 x weekly - 2 reps - 60 seconds hold - Toe Yoga - Alternating Great Toe and Lesser Toe  Extension  - 1-2 x daily - 7 x weekly - 2 sets - 10 reps - Toe Spreading  - 1-2 x daily - 7 x weekly - 2 sets - 10 reps - 5 sec hold - Active Straight Leg Raise with Quad Set  - 1 x daily - 7 x weekly - 2 sets - 10 reps - Sidelying Hip Abduction  - 1 x daily - 7 x weekly - 2 sets - 10 reps - Seated Hip Abduction with Resistance  - 1 x daily - 7 x weekly - 3 sets - 10 reps - blue band hold - Standing Tandem Balance with Counter Support  - 1 x daily - 7 x weekly - 2 reps - 30 sec hold  ASSESSMENT:  CLINICAL IMPRESSION: Patient is a 62 y.o. F who was seen  today for physical therapy evaluation and treatment for balance deficits and LE pain. Physical findings are consistent with physician impression as pt demonstrates decrease in LE strength, gait and sensory balance deficits. FOTO score shows she is operating well below PLOF for subjective functional ability. Pt would benefit from skilled PT services working on improving strength and balance for decreasing fall risk.   OBJECTIVE IMPAIRMENTS: Abnormal gait, decreased activity tolerance, decreased balance, decreased endurance, decreased mobility, difficulty walking, decreased ROM, decreased strength, and pain  ACTIVITY LIMITATIONS: carrying, lifting, sitting, standing, squatting, stairs, transfers, and locomotion level  PARTICIPATION LIMITATIONS: cleaning, driving, shopping, community activity, and church  PERSONAL FACTORS: Time since onset of injury/illness/exacerbation and 3+ comorbidities: L foot bunionectomy and hammertoe correction, L THA, DM II, HTN, Schizoprenia  are also affecting patient's functional outcome.   REHAB POTENTIAL: Good  CLINICAL DECISION MAKING: Evolving/moderate complexity  EVALUATION COMPLEXITY: Moderate   GOALS: Goals reviewed with patient? No  SHORT TERM GOALS: Target date: 07/01/2023   Pt will be compliant and knowledgeable with initial HEP for improved comfort and carryover Baseline: initial HEP given  Goal  status: INITIAL  2.  Pt will self report L foot/hip pain no greater than 6/10 for improved comfort and functional ability Baseline: 10/10 at worst Goal status: INITIAL   LONG TERM GOALS: Target date: 08/05/2023   Pt will improve FOTO function score to no less than 47% as proxy for functional improvement with home ADLs and community activities Baseline: 44% function Goal status: INITIAL   2.  Pt will self report L foot/hip pain no greater than 3/10 for improved comfort and functional ability Baseline: 10/10 at worst Goal status: INITIAL   3.  Pt will decrease Five Time Sit to Stand time to no less than 18 seconds for improved balance, strength, and functional mobility Baseline: 24 seconds Goal status: INITIAL    4.  Pt will improve tandem stance hold position to no less than 30 seconds bilaterally for improved static balance and decrease sensory balance deficits Baseline: 5 seconds Goal status: INITIAL  5.  Pt will improve L proximal hip MMT to no less than 5/5 for improved balance and functional mobility Baseline: see MMT chart Goal status: INITIAL  PLAN:  PT FREQUENCY: 1x/week  PT DURATION: 8 weeks  PLANNED INTERVENTIONS: 97164- PT Re-evaluation, 97110-Therapeutic exercises, 97530- Therapeutic activity, 97112- Neuromuscular re-education, 97535- Self Care, 16109- Manual therapy, [id number]- Gait training, 97014- Electrical stimulation (unattended), [id number]- Electrical stimulation (manual), 97016- Vasopneumatic device, Patient/Family education, Balance training, Joint mobilization, Cryotherapy, and Moist heat  PLAN FOR NEXT SESSION: assess HEP response, gait and APA balance training, LE strengthening  For all possible CPT codes, reference the Planned Interventions line above.     Check all conditions that are expected to impact treatment: {Conditions expected to impact treatment:Diabetes mellitus and Musculoskeletal disorders   If treatment provided at initial evaluation, no  treatment charged due to lack of authorization.       Eloy End PT  06/10/23 9:42 AM

## 2023-06-11 ENCOUNTER — Ambulatory Visit
Admission: RE | Admit: 2023-06-11 | Discharge: 2023-06-11 | Disposition: A | Discharge status: Home / Self Care | Payer: MEDICAID | Source: Ambulatory Visit | Attending: Podiatry | Admitting: Podiatry

## 2023-06-11 DIAGNOSIS — Z1231 Encounter for screening mammogram for malignant neoplasm of breast: Secondary | ICD-10-CM

## 2023-06-13 ENCOUNTER — Ambulatory Visit: Payer: MEDICAID | Admitting: Family Medicine

## 2023-06-13 ENCOUNTER — Encounter: Payer: Self-pay | Admitting: Family Medicine

## 2023-06-13 ENCOUNTER — Telehealth: Payer: Self-pay | Admitting: Family Medicine

## 2023-06-13 ENCOUNTER — Other Ambulatory Visit (HOSPITAL_COMMUNITY)
Admission: RE | Admit: 2023-06-13 | Discharge: 2023-06-13 | Disposition: A | Discharge status: Home / Self Care | Payer: MEDICAID | Source: Ambulatory Visit | Attending: Family Medicine | Admitting: Family Medicine

## 2023-06-13 VITALS — BP 139/69 | HR 78 | Ht 61.0 in | Wt 150.1 lb

## 2023-06-13 DIAGNOSIS — Z139 Encounter for screening, unspecified: Secondary | ICD-10-CM

## 2023-06-13 DIAGNOSIS — Z01411 Encounter for gynecological examination (general) (routine) with abnormal findings: Secondary | ICD-10-CM | POA: Diagnosis not present

## 2023-06-13 DIAGNOSIS — H00014 Hordeolum externum left upper eyelid: Secondary | ICD-10-CM | POA: Diagnosis not present

## 2023-06-13 DIAGNOSIS — R300 Dysuria: Secondary | ICD-10-CM | POA: Diagnosis present

## 2023-06-13 LAB — POCT WET PREP (WET MOUNT)
Clue Cells Wet Prep Whiff POC: NEGATIVE
Trichomonas Wet Prep HPF POC: ABSENT

## 2023-06-13 LAB — POCT URINALYSIS DIP (MANUAL ENTRY)
Bilirubin, UA: NEGATIVE
Glucose, UA: 100 mg/dL — AB
Ketones, POC UA: NEGATIVE mg/dL
Nitrite, UA: POSITIVE — AB
Protein Ur, POC: >=300 mg/dL — AB
Spec Grav, UA: >=1.03 — AB (ref 1.010–1.025)
Urobilinogen, UA: 0.2 U/dL
pH, UA: 5.5 (ref 5.0–8.0)

## 2023-06-13 LAB — POCT UA - MICROSCOPIC ONLY

## 2023-06-13 MED ORDER — CEPHALEXIN 250 MG PO CAPS: 250.0000 mg | ORAL_CAPSULE | Freq: Every day | ORAL | 0 refills | Status: DC

## 2023-06-13 MED ORDER — ERYTHROMYCIN 5 MG/GM OP OINT
TOPICAL_OINTMENT | Freq: Every day | OPHTHALMIC | Status: DC
Start: 2023-06-13 — End: 2024-04-29

## 2023-06-13 NOTE — Patient Instructions (Addendum)
Thank you for visiting clinic today - it is always our pleasure to care for you.  Today we discussed your urinary symptoms and completed STI testing. Your symptoms and initial lab findings suggest you have a urinary tract infection. For this, we have sent an antibiotic to your pharmacy. Please take this once a day for the 7 days. We will let you know of any changes as needed based on your urine culture - this may take several days to return.   For the cyst noted on your pelvic exam, it appears consistent with a cyst that can sometimes occur when the glands in the area become clogged. Because your previous providers have referred you to gynecology, I am referring you once again so that you may be evaluated by them.   For your left eye stye, I have sent a prescription for antibiotic gel for your eye if you'd like to use it. Otherwise, please continue keeping the area clean and apply warm compresses as needed for relief.   I have also placed a referral for you to social work - someone should be contacting over the next week regarding support and resources.  Please schedule an appointment in 1 month for follow up on your diabetes and kidney function. Please let us know if your urinary symptoms do not improve with treatment or worsen.   Reach out any time with any questions or concerns you may have - we are here for you!  Kayla Quale, MD [facility] [phone]

## 2023-06-13 NOTE — Progress Notes (Unsigned)
    SUBJECTIVE:   CHIEF COMPLAINT / HPI:   Dysuria  Started 2 weeks ago - painful urination after having sex  Urinary frequency  No blood in urine No discharge changes No back/flank pain    Eye swelling  Seeing ophtho soon No new vision changes Has felt hot and cold chills , 98 temperature   Social work referral  PERTINENT  PMH / PSH: CKD, diabetes  OBJECTIVE:   BP 139/69   Pulse 78   Ht 5\' 1"  (1.549 m)   Wt 150 lb 2 oz (68.1 kg)   SpO2 96%   BMI 28.37 kg/m   ***  ASSESSMENT/PLAN:   No problem-specific Assessment & Plan notes found for this encounter.     Ivery Quale, MD [facility]

## 2023-06-13 NOTE — Telephone Encounter (Signed)
Called and spoke with patient about referral to gynecology for the cyst finding. She notes prior providers have placed a referral before but that she was never contacted for an appointment. Patient advised to contact our clinic if she does not hear from the gynecologist again in the next 1-2 weeks. Patient also informed of negative wet prep results, including negative BV and negative trichomonas. Patient expressed understanding throughout conversation.

## 2023-06-14 LAB — RPR: RPR Ser Ql: NONREACTIVE

## 2023-06-14 LAB — HIV ANTIBODY (ROUTINE TESTING W REFLEX): HIV Screen 4th Generation wRfx: NONREACTIVE

## 2023-06-15 LAB — URINE CULTURE

## 2023-06-17 ENCOUNTER — Ambulatory Visit: Payer: MEDICAID | Admitting: Physical Therapy

## 2023-06-17 LAB — CERVICOVAGINAL ANCILLARY ONLY
Bacterial Vaginitis (gardnerella): NEGATIVE
Candida Glabrata: NEGATIVE
Candida Vaginitis: NEGATIVE
Chlamydia: NEGATIVE
Comment: NEGATIVE
Comment: NEGATIVE
Comment: NEGATIVE
Comment: NEGATIVE
Comment: NEGATIVE
Comment: NORMAL
Neisseria Gonorrhea: NEGATIVE
Trichomonas: NEGATIVE

## 2023-06-25 LAB — HM DIABETES EYE EXAM

## 2023-06-25 NOTE — Therapy (Signed)
OUTPATIENT PHYSICAL THERAPY TREATMENT   Patient Name: Kayla Gregory MRN: [id number] DOB:1961-03-15, 62 y.o., female Today's Date: 06/26/2023   END OF SESSION:  PT End of Session - 06/26/23 1151     Visit Number 2    Number of Visits 8    Date for PT Re-Evaluation 08/05/23    Authorization Type [employer]    Authorization Time Period 06/16/2023 - 08/16/2023    Authorization - Visit Number 1    Authorization - Number of Visits 8    PT Start Time 1145    PT Stop Time 1225    PT Time Calculation (min) 40 min    Activity Tolerance Patient tolerated treatment well    Behavior During Therapy WFL for tasks assessed/performed              Past Medical History:  Diagnosis Date   GERD (gastroesophageal reflux disease)    Hyperlipidemia associated with type 2 diabetes mellitus (HCC)    Schizophrenia (HCC)    Past Surgical History:  Procedure Laterality Date   BUNIONECTOMY Left 12/12/2022   Procedure: KELLER BUNION IMPLANT;  Surgeon: Felecia Shelling, DPM;  Location: [facility] [place];  Service: Podiatry;  Laterality: Left;   CAPSULOTOMY METATARSOPHALANGEAL Left 12/12/2022   Procedure: CAPSULOTOMY METATARSOPHALANGEAL SECOND;  Surgeon: Felecia Shelling, DPM;  Location: [facility] [place];  Service: Podiatry;  Laterality: Left;   CATARACT EXTRACTION W/ INTRAOCULAR LENS  IMPLANT, BILATERAL Bilateral    approx 2020   COLONOSCOPY  06/2020   ECTOPIC PREGNANCY SURGERY     1980s   FOOT SURGERY Bilateral 03/05/2017   @ [facility] by dr b. Logan Bores;   right 1st bunionectomy/  right 2nd hammertoe correction;  left and right 5th hammer toe correction   HAMMER TOE SURGERY Left 12/12/2022   Procedure: HAMMER TOE CORRECTION SECOND;  Surgeon: Felecia Shelling, DPM;  Location: [facility];  Service: Podiatry;  Laterality: Left;   PTOSIS REPAIR Left 12/25/2017   Procedure: INTERNAL PTOSIS REPAIR LEFT EYE;  Surgeon: Floydene Flock, MD;  Location: [facility];  Service:  Ophthalmology;  Laterality: Left;   TONSILLECTOMY     age 81   TOTAL HIP ARTHROPLASTY Left 2011   Patient Active Problem List   Diagnosis Date Noted   Hyperlipidemia associated with type 2 diabetes mellitus (HCC) 12/27/2022   Family history of malignant neoplasm of gastrointestinal tract 09/26/2020   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   Peripheral neuropathy 10/04/2019   CKD (chronic kidney disease), stage IV (HCC) 04/06/2019   Chronic sinusitis 12/14/2018   Chronic left shoulder pain 12/27/2016   Stable angina (HCC) 12/22/2015   GERD (gastroesophageal reflux disease) 12/22/2015   Chronic hepatitis C (HCC) 12/22/2015   DM (diabetes mellitus) (HCC) 03/31/2014   Essential hypertension 03/31/2014    PCP: Glendale Chard, DO   REFERRING PROVIDER: Billey Co, MD   REFERRING DIAG: R26.89 (ICD-10-CM) - Balance problem   THERAPY DIAG:  Unsteadiness on feet  Other abnormalities of gait and mobility  Muscle weakness (generalized)  Rationale for Evaluation and Treatment: Rehabilitation  ONSET DATE: Chronic   SUBJECTIVE:  SUBJECTIVE STATEMENT: Patient report left foot pain and soreness of the left hip. Its hard for her to walk on her foot like she used too.   PERTINENT HISTORY: L foot bunionectomy and hammertoe correction, L THA, DM II, HTN, Schizophrenia  PAIN:  Are you having pain?  Yes: NPRS scale: 10/10 Worst: 10/10 Pain location: Left foot Pain description: sharp  Aggravating factors: prolonged standing, walking Relieving factors: medication  PRECAUTIONS: None  FALLS:  Has patient fallen in last 6 months? Yes. Number of falls - 3 due to losses of balance  PATIENT GOALS: improve balance and decrease L LE pain   OBJECTIVE:  Note: Objective measures were completed at Evaluation unless otherwise noted. PATIENT SURVEYS:  FOTO: 42% function; 47% predicted  SENSATION: Light touch: Impaired - L foot  EDEMA:  DNT  MUSCLE LENGTH: Hamstrings: Right DNT; Left  DNT Maisie Fus test: Right DNT; Left DNT  POSTURE: rounded shoulders and forward head  PALPATION: TTP to L lateral hip  LOWER EXTREMITY MMT:  MMT Right eval Left eval  Hip flexion 5/5 4/5  Hip extension    Hip abduction 5/5 4/5  Hip adduction    Hip internal rotation    Hip external rotation    Knee flexion 5/5 5/5  Knee extension 5/5 5/5  Ankle dorsiflexion    Ankle plantarflexion    Ankle inversion    Ankle eversion     (Blank rows = not tested)  LOWER EXTREMITY SPECIAL TESTS:  DNT  FUNCTIONAL TESTS:  Five Time Sit to Stand: 24 seconds Tandem Stance: 5 seconds each  06/26/2023: ~ 8 sec each  GAIT: Distance walked: 55ft Assistive device utilized: None Level of assistance: SBA Comments: slight trendelenburg on L   TREATMENT: [facility] Adult PT Treatment:                                                DATE: 06/26/2023 Therapeutic Exercise: NuStep L5 x 5 min with UE/LE while taking subjective Towel scrunch 2 x 20 Seated heel raises with focus on toe extension 2 x 20 Sit to stand 2 x 10 Standing hip abduction and extension with yellow at knees 2 x 10 each Tandem stance 2 x 30 sec each Manual: Mobs for the 1st and 2nd MTP joint Intertarsal mobs Passive to flexion and extension   [facility] Adult PT Treatment:                                                DATE: 06/10/2023 Therapeutic Exercise: Towel scrunch x 60" Toe yoga x 5 Toe splaying x 5 Supine SLR x 5 each S/L hip abd x 5 each Seated clamshell x 10 blue band Tandem stance x 10" each  PATIENT EDUCATION:  Education details: HEP update Person educated: Patient Education method: Explanation, Demonstration, and Handouts Education comprehension: verbalized understanding and returned demonstration  HOME EXERCISE PROGRAM: Access Code: [email or web address]   ASSESSMENT: CLINICAL IMPRESSION: Patient tolerated therapy well with no adverse effects. She continues to report pain primarily related to left 2nd hammer toe repair  and reporting stiffness and difficulty walking following the surgery and hitting her toe at church. She does follow-up with her foot surgeon next week. Therapy focused on improving left toe and foot mobility and active motion, and progressing hip and LE strengthening. She did require frequent UE support for tandem stance with slight improvement compared to eval, and she reported her legs felt tired post therapy. Updated her HEP to progress LE strengthening. Patient would benefit from continued skilled PT to progress her mobility, strength, and balance to reduce pain and maximize functional ability.  EVAL: Patient is a 62 y.o. F who was seen today for physical therapy evaluation and treatment for balance deficits and LE pain. Physical findings are consistent with physician impression as pt demonstrates decrease in LE strength, gait and sensory balance deficits. FOTO score shows she is operating well below PLOF for subjective functional ability. Pt would benefit from skilled PT services working on improving strength and balance for decreasing fall risk.   OBJECTIVE IMPAIRMENTS: Abnormal gait, decreased activity tolerance, decreased balance, decreased endurance, decreased mobility, difficulty walking, decreased ROM, decreased strength, and pain  ACTIVITY LIMITATIONS: carrying, lifting, sitting, standing, squatting, stairs, transfers, and locomotion level  PARTICIPATION LIMITATIONS: cleaning, driving, shopping, community activity, and church  PERSONAL FACTORS: Time since onset of injury/illness/exacerbation and 3+ comorbidities: L foot bunionectomy and hammertoe correction, L THA, DM II, HTN, Schizoprenia  are also affecting patient's functional outcome.    GOALS: Goals reviewed with patient? No  SHORT TERM GOALS: Target date: 07/01/2023   Pt will be compliant and knowledgeable with initial HEP for improved comfort and carryover Baseline: initial HEP given  Goal status: INITIAL  2.  Pt will self  report L foot/hip pain no greater than 6/10 for improved comfort and functional ability Baseline: 10/10 at worst Goal status: INITIAL   LONG TERM GOALS: Target date: 08/05/2023   Pt will improve FOTO function score to no less than 47% as proxy for functional improvement with home ADLs and community activities Baseline: 44% function Goal status: INITIAL   2.  Pt will self report L foot/hip pain no greater than 3/10 for improved comfort and functional ability Baseline: 10/10 at worst Goal status: INITIAL   3.  Pt will decrease Five Time Sit to Stand time to no less than 18 seconds for improved balance, strength, and functional mobility Baseline: 24 seconds Goal status: INITIAL    4.  Pt will improve tandem stance hold position to no less than 30 seconds bilaterally for improved static balance and decrease sensory balance deficits Baseline: 5 seconds Goal status: INITIAL  5.  Pt will improve L proximal hip MMT to no less than 5/5 for improved balance and functional mobility Baseline: see MMT chart Goal status: INITIAL   PLAN: PT FREQUENCY: 1x/week  PT DURATION: 8 weeks  PLANNED INTERVENTIONS: 97164- PT Re-evaluation, 97110-Therapeutic exercises, 97530- Therapeutic activity, 97112- Neuromuscular re-education, 97535- Self Care, 60454- Manual therapy, [id number]- Gait training, 97014- Electrical stimulation (unattended), [id number]- Electrical stimulation (manual), 97016- Vasopneumatic device, Patient/Family education, Balance training, Joint mobilization, Cryotherapy, and Moist heat  PLAN FOR NEXT SESSION: assess HEP response, gait and APA balance training, LE strengthening        Rosana Hoes, PT, DPT, LAT, ATC 06/26/23  12:31 PM Phone: [phone] Fax: [phone]

## 2023-06-26 ENCOUNTER — Encounter: Payer: Self-pay | Admitting: Physical Therapy

## 2023-06-26 ENCOUNTER — Other Ambulatory Visit: Payer: Self-pay

## 2023-06-26 ENCOUNTER — Ambulatory Visit: Payer: MEDICAID | Attending: Student | Admitting: Physical Therapy

## 2023-06-26 DIAGNOSIS — M6281 Muscle weakness (generalized): Secondary | ICD-10-CM | POA: Diagnosis present

## 2023-06-26 DIAGNOSIS — R2681 Unsteadiness on feet: Secondary | ICD-10-CM | POA: Insufficient documentation

## 2023-06-26 DIAGNOSIS — R2689 Other abnormalities of gait and mobility: Secondary | ICD-10-CM | POA: Diagnosis present

## 2023-06-26 NOTE — Patient Instructions (Signed)
Access Code: [email or web address] URL: [email or web address] Date: 06/26/2023 Prepared by: Rosana Hoes  Exercises - Towel Scrunches  - 1-2 x daily - 7 x weekly - 2 reps - 60 seconds hold - Toe Yoga - Alternating Great Toe and Lesser Toe Extension  - 1-2 x daily - 7 x weekly - 2 sets - 10 reps - Toe Spreading  - 1-2 x daily - 7 x weekly - 2 sets - 10 reps - 5 sec hold - Active Straight Leg Raise with Quad Set  - 1 x daily - 7 x weekly - 2 sets - 10 reps - Sidelying Hip Abduction  - 1 x daily - 7 x weekly - 2 sets - 10 reps - Seated Hip Abduction with Resistance  - 1 x daily - 7 x weekly - 3 sets - 10 reps - blue band hold - Standing Tandem Balance with Counter Support  - 1 x daily - 7 x weekly - 2 reps - 30 sec hold - Sit to Stand  - 1 x daily - 7 x weekly - 2 sets - 10 reps

## 2023-06-30 ENCOUNTER — Ambulatory Visit: Payer: MEDICAID

## 2023-06-30 DIAGNOSIS — R2681 Unsteadiness on feet: Secondary | ICD-10-CM

## 2023-06-30 DIAGNOSIS — M6281 Muscle weakness (generalized): Secondary | ICD-10-CM

## 2023-06-30 DIAGNOSIS — R2689 Other abnormalities of gait and mobility: Secondary | ICD-10-CM

## 2023-06-30 NOTE — Therapy (Addendum)
 OUTPATIENT PHYSICAL THERAPY TREATMENT NOTE/DISCHARGE  PHYSICAL THERAPY DISCHARGE SUMMARY  Visits from Start of Care: 3  Current functional level related to goals / functional outcomes: See goals/objective   Remaining deficits: Unable to assess   Education / Equipment: HEP   Patient agrees to discharge. Patient goals were unable to assess. Patient is being discharged due to not returning since the last visit.     Patient Name: Kayla Gregory MRN: [id number] DOB:10-04-1960, 62 y.o., female Today's Date: 06/30/2023   END OF SESSION:  PT End of Session - 06/30/23 0927     Visit Number 3    Number of Visits 8    Date for PT Re-Evaluation 08/05/23    Authorization Type [employer]    Authorization Time Period 06/16/2023 - 08/16/2023    Authorization - Number of Visits 8    PT Start Time 0845    PT Stop Time 0923    PT Time Calculation (min) 38 min    Activity Tolerance Patient tolerated treatment well    Behavior During Therapy [facility] for tasks assessed/performed               Past Medical History:  Diagnosis Date   GERD (gastroesophageal reflux disease)    Hyperlipidemia associated with type 2 diabetes mellitus (HCC)    Schizophrenia (HCC)    Past Surgical History:  Procedure Laterality Date   BUNIONECTOMY Left 12/12/2022   Procedure: KELLER BUNION IMPLANT;  Surgeon: Janit Thresa HERO, DPM;  Location: [facility];  Service: Podiatry;  Laterality: Left;   CAPSULOTOMY METATARSOPHALANGEAL Left 12/12/2022   Procedure: CAPSULOTOMY METATARSOPHALANGEAL SECOND;  Surgeon: Janit Thresa HERO, DPM;  Location: [facility] [place];  Service: Podiatry;  Laterality: Left;   CATARACT EXTRACTION W/ INTRAOCULAR LENS  IMPLANT, BILATERAL Bilateral    approx 2020   COLONOSCOPY  06/2020   ECTOPIC PREGNANCY SURGERY     1980s   FOOT SURGERY Bilateral 03/05/2017   @ [facility] by dr b. janit;   right 1st bunionectomy/  right 2nd hammertoe correction;  left and right 5th hammer  toe correction   HAMMER TOE SURGERY Left 12/12/2022   Procedure: HAMMER TOE CORRECTION SECOND;  Surgeon: Janit Thresa HERO, DPM;  Location: [facility] [place];  Service: Podiatry;  Laterality: Left;   PTOSIS REPAIR Left 12/25/2017   Procedure: INTERNAL PTOSIS REPAIR LEFT EYE;  Surgeon: Laurie Loyd Redhead, MD;  Location: [facility];  Service: Ophthalmology;  Laterality: Left;   TONSILLECTOMY     age 21   TOTAL HIP ARTHROPLASTY Left 2011   Patient Active Problem List   Diagnosis Date Noted   Hyperlipidemia associated with type 2 diabetes mellitus (HCC) 12/27/2022   Family history of malignant neoplasm of gastrointestinal tract 09/26/2020   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   Peripheral neuropathy 10/04/2019   CKD (chronic kidney disease), stage IV (HCC) 04/06/2019   Chronic sinusitis 12/14/2018   Chronic left shoulder pain 12/27/2016   Stable angina (HCC) 12/22/2015   GERD (gastroesophageal reflux disease) 12/22/2015   Chronic hepatitis C (HCC) 12/22/2015   DM (diabetes mellitus) (HCC) 03/31/2014   Essential hypertension 03/31/2014    PCP: Cleotilde Perkins, DO   REFERRING PROVIDER: Donzetta Rollene BRAVO, MD   REFERRING DIAG: R26.89 (ICD-10-CM) - Balance problem   THERAPY DIAG:  Unsteadiness on feet  Other abnormalities of gait and mobility  Muscle weakness (generalized)  Rationale for Evaluation and Treatment: Rehabilitation  ONSET DATE: Chronic   SUBJECTIVE:  SUBJECTIVE STATEMENT: Pt presents to PT  with reports of continued severe pain. Feels like therapy is not helping at all and would like to stop but wants to see doctor first.  PERTINENT HISTORY: L foot bunionectomy and hammertoe correction, L THA, DM II, HTN, Schizophrenia  PAIN:  Are you having pain?  Yes: NPRS scale: 10/10 Worst: 10/10 Pain location: Left foot Pain description: sharp Aggravating factors: prolonged standing, walking Relieving factors: medication  PRECAUTIONS: None  FALLS:  Has patient fallen in last 6  months? Yes. Number of falls - 3 due to losses of balance  PATIENT GOALS: improve balance and decrease L LE pain   OBJECTIVE:  Note: Objective measures were completed at Evaluation unless otherwise noted. PATIENT SURVEYS:  FOTO: 42% function; 47% predicted  SENSATION: Light touch: Impaired - L foot  EDEMA:  DNT  MUSCLE LENGTH: Hamstrings: Right DNT; Left DNT Debby test: Right DNT; Left DNT  POSTURE: rounded shoulders and forward head  PALPATION: TTP to L lateral hip  LOWER EXTREMITY MMT:  MMT Right eval Left eval  Hip flexion 5/5 4/5  Hip extension    Hip abduction 5/5 4/5  Hip adduction    Hip internal rotation    Hip external rotation    Knee flexion 5/5 5/5  Knee extension 5/5 5/5  Ankle dorsiflexion    Ankle plantarflexion    Ankle inversion    Ankle eversion     (Blank rows = not tested)  LOWER EXTREMITY SPECIAL TESTS:  DNT  FUNCTIONAL TESTS:  Five Time Sit to Stand: 24 seconds Tandem Stance: 5 seconds each  06/26/2023: ~ 8 sec each  GAIT: Distance walked: 78ft Assistive device utilized: None Level of assistance: SBA Comments: slight trendelenburg on L   TREATMENT: [facility] Adult PT Treatment:                                                DATE: 06/30/2023 Therapeutic Exercise: NuStep L4 x 5 min with UE/LE while taking subjective Towel scrunch 2x60 Toe yoga 2x10 L Seated heel raises with focus on toe extension 2 x 20 Sit to stand 2x10 Standing hip abduction and extension with yellow at knees 2 x 10 each Manual: Mobs for the 1st and 2nd MTP joint Intertarsal mobs Passive to flexion and extension  [facility] Adult PT Treatment:                                                DATE: 06/26/2023 Therapeutic Exercise: NuStep L5 x 5 min with UE/LE while taking subjective Towel scrunch 2 x 20 Seated heel raises with focus on toe extension 2 x 20 Sit to stand 2 x 10 Standing hip abduction and extension with yellow at knees 2 x 10 each Tandem stance 2 x  30 sec each Manual: Mobs for the 1st and 2nd MTP joint Intertarsal mobs Passive to flexion and extension   [facility] Adult PT Treatment:                                                DATE: 06/10/2023 Therapeutic Exercise: Towel scrunch x 60 Toe yoga x  5 Toe splaying x 5 Supine SLR x 5 each S/L hip abd x 5 each Seated clamshell x 10 blue band Tandem stance x 10 each  PATIENT EDUCATION:  Education details: HEP update Person educated: Patient Education method: Explanation, Demonstration, and Handouts Education comprehension: verbalized understanding and returned demonstration  HOME EXERCISE PROGRAM: Access Code: [email or web address]   ASSESSMENT: CLINICAL IMPRESSION: Pt able to tolerate treatment fair with no adverse effect. Responded well to manual noting slight decrease in pain. Continues to have pain with weight bearing exercise. Pt will see referring provider tomorrow, will assess continued response next session.   EVAL: Patient is a 62 y.o. F who was seen today for physical therapy evaluation and treatment for balance deficits and LE pain. Physical findings are consistent with physician impression as pt demonstrates decrease in LE strength, gait and sensory balance deficits. FOTO score shows she is operating well below PLOF for subjective functional ability. Pt would benefit from skilled PT services working on improving strength and balance for decreasing fall risk.   OBJECTIVE IMPAIRMENTS: Abnormal gait, decreased activity tolerance, decreased balance, decreased endurance, decreased mobility, difficulty walking, decreased ROM, decreased strength, and pain  ACTIVITY LIMITATIONS: carrying, lifting, sitting, standing, squatting, stairs, transfers, and locomotion level  PARTICIPATION LIMITATIONS: cleaning, driving, shopping, community activity, and church  PERSONAL FACTORS: Time since onset of injury/illness/exacerbation and 3+ comorbidities: L foot bunionectomy and hammertoe correction, L  THA, DM II, HTN, Schizoprenia are also affecting patient's functional outcome.    GOALS: Goals reviewed with patient? No  SHORT TERM GOALS: Target date: 07/01/2023   Pt will be compliant and knowledgeable with initial HEP for improved comfort and carryover Baseline: initial HEP given  Goal status: INITIAL  2.  Pt will self report L foot/hip pain no greater than 6/10 for improved comfort and functional ability Baseline: 10/10 at worst Goal status: INITIAL   LONG TERM GOALS: Target date: 08/05/2023   Pt will improve FOTO function score to no less than 47% as proxy for functional improvement with home ADLs and community activities Baseline: 44% function Goal status: INITIAL   2.  Pt will self report L foot/hip pain no greater than 3/10 for improved comfort and functional ability Baseline: 10/10 at worst Goal status: INITIAL   3.  Pt will decrease Five Time Sit to Stand time to no less than 18 seconds for improved balance, strength, and functional mobility Baseline: 24 seconds Goal status: INITIAL    4.  Pt will improve tandem stance hold position to no less than 30 seconds bilaterally for improved static balance and decrease sensory balance deficits Baseline: 5 seconds Goal status: INITIAL  5.  Pt will improve L proximal hip MMT to no less than 5/5 for improved balance and functional mobility Baseline: see MMT chart Goal status: INITIAL   PLAN: PT FREQUENCY: 1x/week  PT DURATION: 8 weeks  PLANNED INTERVENTIONS: 97164- PT Re-evaluation, 97110-Therapeutic exercises, 97530- Therapeutic activity, 97112- Neuromuscular re-education, 97535- Self Care, [id number]- Manual therapy, [id number]- Gait training, 97014- Electrical stimulation (unattended), [id number]- Electrical stimulation (manual), 97016- Vasopneumatic device, Patient/Family education, Balance training, Joint mobilization, Cryotherapy, and Moist heat  PLAN FOR NEXT SESSION: assess HEP response, gait and APA balance training, LE  strengthening     Alm JAYSON Kingdom PT  06/30/23 9:27 AM

## 2023-07-02 ENCOUNTER — Ambulatory Visit (INDEPENDENT_AMBULATORY_CARE_PROVIDER_SITE_OTHER): Payer: MEDICAID

## 2023-07-02 ENCOUNTER — Encounter: Payer: Self-pay | Admitting: Podiatry

## 2023-07-02 ENCOUNTER — Ambulatory Visit (INDEPENDENT_AMBULATORY_CARE_PROVIDER_SITE_OTHER): Payer: MEDICAID | Admitting: Podiatry

## 2023-07-02 DIAGNOSIS — S92325A Nondisplaced fracture of second metatarsal bone, left foot, initial encounter for closed fracture: Secondary | ICD-10-CM

## 2023-07-02 DIAGNOSIS — M7752 Other enthesopathy of left foot: Secondary | ICD-10-CM

## 2023-07-02 DIAGNOSIS — M779 Enthesopathy, unspecified: Secondary | ICD-10-CM

## 2023-07-02 MED ORDER — MELOXICAM 15 MG PO TABS: 15.0000 mg | ORAL_TABLET | Freq: Every day | ORAL | 1 refills | Status: DC

## 2023-07-02 MED ORDER — TRAMADOL HCL 50 MG PO TABS: 50.0000 mg | ORAL_TABLET | ORAL | 0 refills | Status: AC | PRN

## 2023-07-02 NOTE — Progress Notes (Signed)
Chief Complaint  Patient presents with   Foot Pain    PATIENT STATES SHE TRIPPED ABOUT A WEEK AGO , AND STATES SHE HAS BEEN IN PAIN EVER SINCE IN HER LF . SHE STATES SHE WANTS THE PIN OUT OF 2ND TOE . NO MEDICATION FOR PAIN .     Subjective:  Patient presents today for evaluation of left foot pain secondary to a fall injury that the patient sustained on 06/22/2023.  Patient states that she fell down some stairs at church.  Immediately she had some pain and tenderness associated to the left foot.  She does have a surgical history of left great toe arthroplasty with implant and hammertoe correction of the second digit left foot.  DOS: 12/12/2022.  Patient states that prior to this incident she was doing well with no pain  Past Medical History:  Diagnosis Date   GERD (gastroesophageal reflux disease)    Hyperlipidemia associated with type 2 diabetes mellitus (HCC)    Schizophrenia [facility])     Past Surgical History:  Procedure Laterality Date   BUNIONECTOMY Left 12/12/2022   Procedure: KELLER BUNION IMPLANT;  Surgeon: Felecia Shelling, DPM;  Location: [facility];  Service: Podiatry;  Laterality: Left;   CAPSULOTOMY METATARSOPHALANGEAL Left 12/12/2022   Procedure: CAPSULOTOMY METATARSOPHALANGEAL SECOND;  Surgeon: Felecia Shelling, DPM;  Location: [facility] [place];  Service: Podiatry;  Laterality: Left;   CATARACT EXTRACTION W/ INTRAOCULAR LENS  IMPLANT, BILATERAL Bilateral    approx 2020   COLONOSCOPY  06/2020   ECTOPIC PREGNANCY SURGERY     1980s   FOOT SURGERY Bilateral 03/05/2017   @ [facility] by dr b. Logan Bores;   right 1st bunionectomy/  right 2nd hammertoe correction;  left and right 5th hammer toe correction   HAMMER TOE SURGERY Left 12/12/2022   Procedure: HAMMER TOE CORRECTION SECOND;  Surgeon: Felecia Shelling, DPM;  Location: [facility];  Service: Podiatry;  Laterality: Left;   PTOSIS REPAIR Left 12/25/2017   Procedure: INTERNAL PTOSIS REPAIR LEFT EYE;   Surgeon: Floydene Flock, MD;  Location: [facility];  Service: Ophthalmology;  Laterality: Left;   TONSILLECTOMY     age 62   TOTAL HIP ARTHROPLASTY Left 2011    Allergies  Allergen Reactions   Metformin And Related Diarrhea and Other (See Comments)    GI symptoms including abdominal pain and diarrhea - Pt not interested in taking again   Norvasc [Amlodipine] Itching   Omeprazole Rash    Objective: Physical Exam General: The patient is alert and oriented x3 in no acute distress.  Dermatology: Skin is cool, dry and supple bilateral lower extremities. Negative for open lesions or macerations.  Vascular: Palpable pedal pulses bilaterally.  There is some mild warmth and edema associated to the left midfoot. Capillary refill within normal limits.  Neurological: Grossly intact via light touch  Musculoskeletal Exam: No pedal deformity.  There is tenderness with palpation along the base of the second metatarsal of the left foot   Radiographic Exam LT foot 07/02/2023:  Silastic implant good alignment well-seated into the first MTP.  Good alignment of the second ray.  Arthroplasty of the PIPJ of the second digit. New finding of a transverse fracture at the base of the second metatarsal metaphyseal diaphyseal junction.  Nondisplaced.  Assessment: 1.  Closed nondisplaced fracture base of the second metatarsal left foot.  DOI: 06/22/2023  2.  H/o left great toe arthroplasty with implant.  Hammertoe repair second left. DOS:  12/12/2022  -Patient evaluated.  X-rays reviewed - Surgical shoe dispensed.  WBAT -RICE -Prescription for tramadol 50 mg every 4 hours as needed pain -Prescription for meloxicam 15 mg daily -Return to clinic 8 weeks follow-up x-ray  Felecia Shelling, DPM [facility]  Dr. Felecia Shelling, DPM    [address] [place], [place] [address]                Office [phone]  Fax [phone]

## 2023-07-07 ENCOUNTER — Ambulatory Visit: Payer: MEDICAID

## 2023-07-10 NOTE — Patient Instructions (Addendum)
It was great to see you today! Thank you for choosing [facility] for your primary care.  Today we addressed: Increase Lantus to 10 U daily, check sugars and call if morning sugar is <80 Keep a log of sugars Follow up with Dr. Raymondo Band  Call if you experience repeat low sugars, fatigue, shaking, dizziness   If you haven't already, sign up for My Chart to have easy access to your labs results, and communication with your primary care physician. I recommend that you always bring your medications to each appointment as this makes it easy to ensure you are on the correct medications and helps [place] not miss refills when you need them. Call the clinic at [phone] if your symptoms worsen or you have any concerns.  Please arrive 15 minutes before your appointment to ensure smooth check in process.  We appreciate your efforts in making this happen.  Thank you for allowing me to participate in your care, Alfredo Martinez, MD 07/11/2023, 8:46 AM PGY-3, [facility]

## 2023-07-10 NOTE — Progress Notes (Signed)
    SUBJECTIVE:   CHIEF COMPLAINT / HPI:   CKD stage V HTN  -Follows with [facility]  -On Valsartan-hydrochlorothiazide  -SGLT2 has been declined in the past -Patient reports that she has a visit with nephrology today and will get lab work at that visit -She checks her blood pressure at home and it is consistently in the 120s over 70s to 80s -Brought log with her over the last few days   DM2  HLD  -Due for A1C -UTD on foot exam - Has had her eye exam - goes back on January 6th  -On LAI 5U daily  -On statin  -Last cholesterol check 2 yr ago  -Going to kidney doctors today and does not want labs  -Patient has some concern about continuous changing of her insulin dosage.  She does report that she has been taking her insulin consistently and has not missed any doses. -did not tolerate metformin in past   PERTINENT  PMH / PSH:  GERD Chronic Hep C Stable angina   OBJECTIVE:   BP (!) 160/80   Pulse (!) 51   Ht 5\' 1"  (1.549 m)   Wt 154 lb 8 oz (70.1 kg)   SpO2 100%   BMI 29.19 kg/m   General: Alert and oriented in no apparent distress Heart: Regular rate and rhythm with no murmurs appreciated Lungs: CTA bilaterally, no wheezing Abdomen: no abdominal pain Skin: Warm and dry   ASSESSMENT/PLAN:   Assessment & Plan Type 2 diabetes mellitus with hyperosmolarity without coma, with long-term current use of insulin (HCC) poorly controlled - Last A1c:  Lab Results  Component Value Date   HGBA1C 8.7 (A) 07/11/2023   - Medications: Increase Lantus to 10 units, sent refill today and instructed to return or call if she has hypoglycemic symptoms - Compliance: Good to increase Lantus based on A1c as it increased from 6.5-8.7 -On statin, declined labs politely today -Up-to-date on urine, foot, eye exam.  Obtained ROI for eye exam  Essential hypertension Patient has consistently normotensive pressures at home which would be more reliable than the pressures here.  Will  keep her on the same medication, is going to see her nephrologist today.  Scheduled with [facility] in 3 months for DM management   Alfredo Martinez, MD [facility]

## 2023-07-11 ENCOUNTER — Encounter: Payer: Self-pay | Admitting: Student

## 2023-07-11 ENCOUNTER — Ambulatory Visit (INDEPENDENT_AMBULATORY_CARE_PROVIDER_SITE_OTHER): Payer: MEDICAID | Admitting: Student

## 2023-07-11 ENCOUNTER — Ambulatory Visit: Payer: MEDICAID | Admitting: Family Medicine

## 2023-07-11 VITALS — BP 160/80 | HR 51 | Ht 61.0 in | Wt 154.5 lb

## 2023-07-11 DIAGNOSIS — I10 Essential (primary) hypertension: Secondary | ICD-10-CM

## 2023-07-11 DIAGNOSIS — E1100 Type 2 diabetes mellitus with hyperosmolarity without nonketotic hyperglycemic-hyperosmolar coma (NKHHC): Secondary | ICD-10-CM

## 2023-07-11 DIAGNOSIS — Z794 Long term (current) use of insulin: Secondary | ICD-10-CM | POA: Diagnosis not present

## 2023-07-11 LAB — POCT GLYCOSYLATED HEMOGLOBIN (HGB A1C): HbA1c, POC (controlled diabetic range): 8.7 % — AB (ref 0.0–7.0)

## 2023-07-11 MED ORDER — LANTUS SOLOSTAR 100 UNIT/ML ~~LOC~~ SOPN: 10.0000 [IU] | PEN_INJECTOR | Freq: Every day | SUBCUTANEOUS | 3 refills | Status: DC

## 2023-07-11 NOTE — Assessment & Plan Note (Signed)
Patient has consistently normotensive pressures at home which would be more reliable than the pressures here.  Will keep her on the same medication, is going to see her nephrologist today.

## 2023-07-11 NOTE — Assessment & Plan Note (Signed)
poorly controlled - Last A1c:  Lab Results  Component Value Date   HGBA1C 8.7 (A) 07/11/2023   - Medications: Increase Lantus to 10 units, sent refill today and instructed to return or call if she has hypoglycemic symptoms - Compliance: Good to increase Lantus based on A1c as it increased from 6.5-8.7 -On statin, declined labs politely today -Up-to-date on urine, foot, eye exam.  Obtained ROI for eye exam

## 2023-07-17 ENCOUNTER — Emergency Department (HOSPITAL_COMMUNITY): Payer: MEDICAID

## 2023-07-17 ENCOUNTER — Other Ambulatory Visit: Payer: Self-pay

## 2023-07-17 ENCOUNTER — Encounter (HOSPITAL_COMMUNITY): Payer: Self-pay

## 2023-07-17 ENCOUNTER — Emergency Department (HOSPITAL_COMMUNITY)
Admission: EM | Admit: 2023-07-17 | Discharge: 2023-07-18 | Disposition: A | Discharge status: Home / Self Care | Payer: MEDICAID | Attending: Emergency Medicine | Admitting: Emergency Medicine

## 2023-07-17 DIAGNOSIS — J40 Bronchitis, not specified as acute or chronic: Secondary | ICD-10-CM | POA: Diagnosis not present

## 2023-07-17 DIAGNOSIS — Z20822 Contact with and (suspected) exposure to covid-19: Secondary | ICD-10-CM | POA: Diagnosis not present

## 2023-07-17 DIAGNOSIS — Z7982 Long term (current) use of aspirin: Secondary | ICD-10-CM | POA: Insufficient documentation

## 2023-07-17 DIAGNOSIS — R059 Cough, unspecified: Secondary | ICD-10-CM | POA: Diagnosis present

## 2023-07-17 NOTE — ED Triage Notes (Signed)
Pt is coming I with reports of nasal congestion, fullness in her head, as well as a cough. She also reports having some bloody sputum when she coughs, as well as some mid-sternal chest pain. She does not mention any fevers at home but generally feeling unwell and lethargic.

## 2023-07-18 ENCOUNTER — Emergency Department (HOSPITAL_COMMUNITY): Payer: MEDICAID

## 2023-07-18 LAB — BASIC METABOLIC PANEL
Anion gap: 10 (ref 5–15)
BUN: 53 mg/dL — ABNORMAL HIGH (ref 8–23)
CO2: 24 mmol/L (ref 22–32)
Calcium: 9.6 mg/dL (ref 8.9–10.3)
Chloride: 101 mmol/L (ref 98–111)
Creatinine, Ser: 5.5 mg/dL — ABNORMAL HIGH (ref 0.44–1.00)
GFR, Estimated: 8 mL/min — ABNORMAL LOW (ref >60)
Glucose, Bld: 227 mg/dL — ABNORMAL HIGH (ref 70–99)
Potassium: 3.7 mmol/L (ref 3.5–5.1)
Sodium: 135 mmol/L (ref 135–145)

## 2023-07-18 LAB — CBC
HCT: 29.4 % — ABNORMAL LOW (ref 36.0–46.0)
Hemoglobin: 10 g/dL — ABNORMAL LOW (ref 12.0–15.0)
MCH: 30.8 pg (ref 26.0–34.0)
MCHC: 34 g/dL (ref 30.0–36.0)
MCV: 90.5 fL (ref 80.0–100.0)
Platelets: 200 10*3/uL (ref 150–400)
RBC: 3.25 MIL/uL — ABNORMAL LOW (ref 3.87–5.11)
RDW: 12.3 % (ref 11.5–15.5)
WBC: 8 10*3/uL (ref 4.0–10.5)
nRBC: 0 % (ref 0.0–0.2)

## 2023-07-18 LAB — TROPONIN I (HIGH SENSITIVITY)
Troponin I (High Sensitivity): 10 ng/L (ref <18)
Troponin I (High Sensitivity): 9 ng/L (ref <18)

## 2023-07-18 LAB — RESP PANEL BY RT-PCR (RSV, FLU A&B, COVID)  RVPGX2
Influenza A by PCR: NEGATIVE
Influenza B by PCR: NEGATIVE
Resp Syncytial Virus by PCR: NEGATIVE
SARS Coronavirus 2 by RT PCR: NEGATIVE

## 2023-07-18 MED ORDER — BENZONATATE 100 MG PO CAPS: 100.0000 mg | ORAL_CAPSULE | Freq: Three times a day (TID) | ORAL | 0 refills | Status: DC | PRN

## 2023-07-18 MED ORDER — DOXYCYCLINE HYCLATE 100 MG PO CAPS: 100.0000 mg | ORAL_CAPSULE | Freq: Two times a day (BID) | ORAL | 0 refills | Status: DC

## 2023-07-18 MED ORDER — ALBUTEROL SULFATE HFA 108 (90 BASE) MCG/ACT IN AERS: 2.0000 | INHALATION_SPRAY | RESPIRATORY_TRACT | 2 refills | Status: DC | PRN

## 2023-07-18 NOTE — ED Provider Notes (Signed)
[place] EMERGENCY DEPARTMENT AT [facility] Provider Note   CSN: [id number] Arrival date & time: 07/17/23  2319     History  Chief Complaint  Patient presents with   Cough    Kayla Gregory is a 62 y.o. female.  Patient presents to the emergency department with complaints of URI symptoms.  Patient has been sick since yesterday.  Patient with nasal congestion, cough, sputum production.  No shortness of breath.  She has some discomfort in her chest.  Has not had a fever but feels generalized malaise.       Home Medications Prior to Admission medications   Medication Sig Start Date End Date Taking? Authorizing Provider  albuterol (VENTOLIN HFA) 108 (90 Base) MCG/ACT inhaler Inhale 2 puffs into the lungs every 4 (four) hours as needed for wheezing or shortness of breath. 07/18/23  Yes Anniyah Mood, Canary Brim, MD  benzonatate (TESSALON) 100 MG capsule Take 1 capsule (100 mg total) by mouth 3 (three) times daily as needed for cough. 07/18/23  Yes Raesean Bartoletti, Canary Brim, MD  doxycycline (VIBRAMYCIN) 100 MG capsule Take 1 capsule (100 mg total) by mouth 2 (two) times daily. 07/18/23  Yes Gilda Crease, MD  Accu-Chek Softclix Lancets lancets Use as instructed 10/11/22   Glendale Chard, DO  aspirin EC 81 MG tablet Take 81 mg by mouth daily. Swallow whole.    [provider]  atorvastatin (LIPITOR) 40 MG tablet Take 1 tablet (40 mg total) by mouth daily. 12/27/22   Glendale Chard, DO  benztropine (COGENTIN) 1 MG tablet Take by mouth 2 (two) times daily. Per pt take 4 tabs in am and 1 tab at bedtime    [provider]  Blood Glucose Monitoring Suppl (ACCU-CHEK AVIVA PLUS) w/Device KIT 1 application  by Does not apply route 4 (four) times daily -  with meals and at bedtime. 07/29/22   Glendale Chard, DO  Blood Pressure Monitoring (BLOOD PRESSURE CUFF) MISC 1 each by Does not apply route daily. 07/29/22   Glendale Chard, DO  carboxymethylcellulose (REFRESH  PLUS) 0.5 % SOLN Place 1 drop into both eyes 3 (three) times daily as needed.    [provider]  cetirizine (ZYRTEC) 10 MG tablet Take 1 tablet (10 mg total) by mouth daily as needed for allergies. 05/21/23   Glendale Chard, DO  cloNIDine (CATAPRES) 0.1 MG tablet Take 0.1 mg by mouth 2 (two) times daily.    [provider]  fluticasone (FLONASE) 50 MCG/ACT nasal spray Place 2 sprays into both nostrils daily. 12/27/22   Glendale Chard, DO  gabapentin (NEURONTIN) 100 MG capsule TAKE 1 CAPSULE(100 MG) BY MOUTH AT BEDTIME AS NEEDED 05/14/23   Glendale Chard, DO  glucose blood (ACCU-CHEK AVIVA PLUS) test strip Use to check blood glucose up to four times daily 10/11/22   Glendale Chard, DO  insulin glargine (LANTUS SOLOSTAR) 100 UNIT/ML Solostar Pen Inject 10 Units into the skin daily. 07/11/23   Alfredo Martinez, MD  Insulin Pen Needle (B-D ULTRAFINE III SHORT PEN) 31G X 8 MM MISC USE DAILY AS DIRECTED 10/11/22   Glendale Chard, DO  Lancet Device MISC Check blood glucose once a day in the AM. 04/13/20   Sandre Kitty, MD  Lancet Devices [facility]) lancets Use as instructed 11/22/16   Lovena Neighbours, MD  meloxicam (MOBIC) 15 MG tablet Take 1 tablet (15 mg total) by mouth daily. 07/02/23 10/30/23  Felecia Shelling, DPM  pantoprazole (PROTONIX) 40 MG tablet TAKE 1  TABLET(40 MG) BY MOUTH DAILY Patient taking differently: Take 40 mg by mouth daily. 08/06/22   Glendale Chard, DO  ranitidine (ZANTAC) 150 MG capsule Take 150 mg by mouth 2 (two) times daily. 05/23/15   [provider]  triamcinolone cream (KENALOG) 0.1 % Apply 1 Application topically 2 (two) times daily. 04/15/23   Glendale Chard, DO  valsartan-hydrochlorothiazide (DIOVAN-HCT) 160-25 MG tablet TAKE 1 TABLET BY MOUTH DAILY 05/20/23   Glendale Chard, DO  ziprasidone (GEODON) 20 MG capsule Take 20 mg by mouth daily with breakfast.    [provider]      Allergies    Metformin and related, Norvasc [amlodipine], and  Omeprazole    Review of Systems   Review of Systems  Physical Exam Updated Vital Signs BP (!) 171/79   Pulse (!) 58   Temp 98.2 F (36.8 C) (Oral)   Resp 16   Ht 5\' 1"  (1.549 m)   Wt 69.9 kg   SpO2 98%   BMI 29.10 kg/m  Physical Exam Vitals and nursing note reviewed.  Constitutional:      General: She is not in acute distress.    Appearance: She is well-developed.  HENT:     Head: Normocephalic and atraumatic.     Mouth/Throat:     Mouth: Mucous membranes are moist.  Eyes:     General: Vision grossly intact. Gaze aligned appropriately.     Extraocular Movements: Extraocular movements intact.     Conjunctiva/sclera: Conjunctivae normal.  Cardiovascular:     Rate and Rhythm: Normal rate and regular rhythm.     Pulses: Normal pulses.     Heart sounds: Normal heart sounds, S1 normal and S2 normal. No murmur heard.    No friction rub. No gallop.  Pulmonary:     Effort: Pulmonary effort is normal. No respiratory distress.     Breath sounds: Normal breath sounds.  Abdominal:     General: Bowel sounds are normal.     Palpations: Abdomen is soft.     Tenderness: There is no abdominal tenderness. There is no guarding or rebound.     Hernia: No hernia is present.  Musculoskeletal:        General: No swelling.     Cervical back: Full passive range of motion without pain, normal range of motion and neck supple. No spinous process tenderness or muscular tenderness. Normal range of motion.     Right lower leg: No edema.     Left lower leg: No edema.  Skin:    General: Skin is warm and dry.     Capillary Refill: Capillary refill takes less than 2 seconds.     Findings: No ecchymosis, erythema, rash or wound.  Neurological:     General: No focal deficit present.     Mental Status: She is alert and oriented to person, place, and time.     GCS: GCS eye subscore is 4. GCS verbal subscore is 5. GCS motor subscore is 6.     Cranial Nerves: Cranial nerves 2-12 are intact.      Sensory: Sensation is intact.     Motor: Motor function is intact.     Coordination: Coordination is intact.  Psychiatric:        Attention and Perception: Attention normal.        Mood and Affect: Mood normal.        Speech: Speech normal.        Behavior: Behavior normal.     ED  Results / Procedures / Treatments   Labs (all labs ordered are listed, but only abnormal results are displayed) Labs Reviewed  BASIC METABOLIC PANEL - Abnormal; Notable for the following components:      Result Value   Glucose, Bld 227 (*)    BUN 53 (*)    Creatinine, Ser 5.50 (*)    GFR, Estimated 8 (*)    All other components within normal limits  CBC - Abnormal; Notable for the following components:   RBC 3.25 (*)    Hemoglobin 10.0 (*)    HCT 29.4 (*)    All other components within normal limits  RESP PANEL BY RT-PCR (RSV, FLU A&B, COVID)  RVPGX2  TROPONIN I (HIGH SENSITIVITY)  TROPONIN I (HIGH SENSITIVITY)    EKG None  Radiology DG Chest 2 View  Result Date: 07/18/2023 CLINICAL DATA:  Midsternal chest pain, hemoptysis EXAM: CHEST - 2 VIEW COMPARISON:  None Available. FINDINGS: The heart size and mediastinal contours are within normal limits. Both lungs are clear. The visualized skeletal structures are unremarkable. IMPRESSION: No active cardiopulmonary disease. Electronically Signed   By: Helyn Numbers M.D.   On: 07/18/2023 00:21    Procedures Procedures    Medications Ordered in ED Medications - No data to display  ED Course/ Medical Decision Making/ A&P                                 Medical Decision Making Amount and/or Complexity of Data Reviewed Labs: ordered. Radiology: ordered.   Differential Diagnosis considered includes, but not limited to: COVID-19; influenza; RSV; simple viral URI; pneumonia  Patient in no distress.  Oxygen saturation is 98 to 100% on room air.  She is breathing comfortably.  She does have persistent cough during exam.  Sputum is mostly thick  yellow sputum but there is slight blood-tinged.  Chest x-ray without pneumonia.  No independent risk factors for PE, symptoms more consistent with mucopurulent bronchitis.  Cardiac evaluation negative.        Final Clinical Impression(s) / ED Diagnoses Final diagnoses:  Bronchitis    Rx / DC Orders ED Discharge Orders          Ordered    albuterol (VENTOLIN HFA) 108 (90 Base) MCG/ACT inhaler  Every 4 hours PRN        07/18/23 0312    doxycycline (VIBRAMYCIN) 100 MG capsule  2 times daily        07/18/23 0312    benzonatate (TESSALON) 100 MG capsule  3 times daily PRN        07/18/23 0312              Gilda Crease, MD 07/18/23 [phone]

## 2023-07-31 ENCOUNTER — Other Ambulatory Visit: Payer: Self-pay | Admitting: Student

## 2023-07-31 DIAGNOSIS — K219 Gastro-esophageal reflux disease without esophagitis: Secondary | ICD-10-CM

## 2023-08-05 ENCOUNTER — Encounter: Payer: Self-pay | Admitting: Obstetrics and Gynecology

## 2023-08-05 ENCOUNTER — Ambulatory Visit (INDEPENDENT_AMBULATORY_CARE_PROVIDER_SITE_OTHER): Payer: MEDICAID | Admitting: Obstetrics and Gynecology

## 2023-08-05 VITALS — BP 154/82 | HR 72 | Ht 61.0 in | Wt 150.1 lb

## 2023-08-05 DIAGNOSIS — I10 Essential (primary) hypertension: Secondary | ICD-10-CM

## 2023-08-05 DIAGNOSIS — N750 Cyst of Bartholin's gland: Secondary | ICD-10-CM | POA: Diagnosis not present

## 2023-08-05 DIAGNOSIS — N898 Other specified noninflammatory disorders of vagina: Secondary | ICD-10-CM | POA: Diagnosis not present

## 2023-08-05 DIAGNOSIS — Z1339 Encounter for screening examination for other mental health and behavioral disorders: Secondary | ICD-10-CM

## 2023-08-05 NOTE — Progress Notes (Signed)
ANNUAL EXAM Patient name: Kayla Gregory MRN [id number]  Date of birth: 1960-12-09 Chief Complaint:   Gynecologic Exam  History of Present Illness:   Kayla Gregory is a 62 y.o. G0P0 African-American female being seen today for a routine annual exam.  Current complaints: PCP found vaginal lesion on last routine pap. Not bothersome, no bleeding, swelling. Sent for evaluation. Patient UTD with screenings through PCP.  No LMP recorded. Patient is postmenopausal.      08/05/2023    9:24 AM 07/11/2023    8:28 AM 04/15/2023    8:31 AM 01/03/2023    9:01 AM 12/27/2022    9:10 AM  Depression screen PHQ 2/9  Decreased Interest 0 0 0 0 0  Down, Depressed, Hopeless 0 0 0 0 0  PHQ - 2 Score 0 0 0 0 0  Altered sleeping 0 0 0 0 0  Tired, decreased energy 0 0 0 0 0  Change in appetite 0 0 0 0 0  Feeling bad or failure about yourself  0 0 0 0 0  Trouble concentrating 0 0 0 0 0  Moving slowly or fidgety/restless 0 0 0 0 0  Suicidal thoughts 0 0 0 0 0  PHQ-9 Score 0 0 0 0 0  Difficult doing work/chores Not difficult at all  Not difficult at all          08/05/2023    9:25 AM  GAD 7 : Generalized Anxiety Score  Nervous, Anxious, on Edge 0  Control/stop worrying 0  Worry too much - different things 0  Trouble relaxing 0  Restless 0  Easily annoyed or irritable 0  Afraid - awful might happen 0  Total GAD 7 Score 0  Anxiety Difficulty Not difficult at all     Review of Systems:   Pertinent items are noted in HPI Denies any headaches, blurred vision, fatigue, shortness of breath, chest pain, abdominal pain, abnormal vaginal discharge/itching/odor/irritation, problems with periods, bowel movements, urination, or intercourse unless otherwise stated above. Pertinent History Reviewed:  Reviewed past medical,surgical, social and family history.  Reviewed problem list, medications and allergies. Physical Assessment:   Vitals:   08/05/23 0859  BP: (!) 154/82  Pulse: 72  Weight: 150  lb 1.6 oz (68.1 kg)  Height: 5\' 1"  (1.549 m)  Body mass index is 28.36 kg/m.        Physical Examination:   General appearance - well appearing, and in no distress  Pelvic - VULVA: normal appearing vulva with no masses, tenderness or lesions  VAGINA: Suspected bartholin gland cyst R introitus. ~0.5cm diameter. No tender, no erythema. Overlying macule bluish in color with regular borders  Extremities:  No swelling or varicosities noted  Chaperone present for exam  No results found for this or any previous visit (from the past 24 hours).  Assessment & Plan:  1. Vaginal lesion (Primary) New problem, uncertain diagnosis. Do suspect small bartholin gland cyst with overlying nevi. Appears to be unchanged from exam by PCP 2 months ago and from ~1.5 years ago per notes. - Discussed lesion is benign appearing, however would be reasonable to biopsy/excise - Patient prefers removal vs follow-up monitoring. Discussed in office excision with local anesthesia. She prefers this vs sedation - Return to office for procedure  2. Essential hypertension Notes 120/80s BP at home  3. Bartholin gland cyst New problem, uncomplicated. Has not had any abscesses/infections. - Discussed removal if desired. She does desire removal  No orders of the defined  types were placed in this encounter.   Meds: No orders of the defined types were placed in this encounter.   Follow-up: Return for Bartholin gland/vaginal lesion excision.  Joanne Gavel, MD 08/05/2023 10:37 AM

## 2023-08-05 NOTE — Progress Notes (Signed)
B/P at home runs 120s/80s   States was told had a black area in vagina with last pap smear. Is worried about that. Mammo up to date 05/2023

## 2023-08-25 ENCOUNTER — Ambulatory Visit: Payer: 59 | Admitting: Podiatry

## 2023-08-26 NOTE — Progress Notes (Signed)
 GYNECOLOGY  VISIT   HPI: Kayla Gregory is a 63 y.o.    No obstetric history on file. here for vaginal lesion.   Referred from PCP, who found lesion 12/19/21.   She reports no fever, chills, n/v, pelvic pain, dyspareunia, difficulty walking or sitting. Not currently sexually active. Lesion may have increased in size but otherwise not bothering her. Would like to have it removed at this time. Previously told on exam that it was likely benign.   GYNECOLOGIC HISTORY: No LMP recorded. Patient is postmenopausal. Last mammogram: 06/11/23- no abnormalities Last pap smear: 04/26/22- ASC-US  HRHPV neg, 07/07/2017- NILM HRPHV not done.  Diagnosis  Date Value Ref Range Status  04/26/2022 (A)  Final   - Atypical squamous cells of undetermined significance (ASC-US )           OB History   No obstetric history on file.        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   Peripheral neuropathy 10/04/2019   CKD (chronic kidney disease), stage IV (HCC) 04/06/2019   Chronic sinusitis 12/14/2018   Chronic left shoulder pain 12/27/2016   Stable angina (HCC) 12/22/2015   GERD (gastroesophageal reflux disease) 12/22/2015   Chronic hepatitis C (HCC) 12/22/2015   DM (diabetes mellitus) (HCC) 03/31/2014   Essential hypertension 03/31/2014    Past Medical History:  Diagnosis Date   Chronic kidney disease    GERD (gastroesophageal reflux disease)    Hyperlipidemia associated with type 2 diabetes mellitus (HCC)    Hypertension    Liver disease    Schizophrenia (HCC)     Past Surgical History:  Procedure Laterality Date   BUNIONECTOMY Left 12/12/2022   Procedure: KELLER BUNION IMPLANT;  Surgeon: Janit Thresa HERO, DPM;  [facility];  Service: Podiatry;  Laterality: Left;   CAPSULOTOMY METATARSOPHALANGEAL Left 12/12/2022   Procedure: CAPSULOTOMY METATARSOPHALANGEAL SECOND;  Surgeon: Janit Thresa HERO, DPM;  Location: [facility];  Service: Podiatry;  Laterality: Left;   CATARACT EXTRACTION W/ INTRAOCULAR LENS  IMPLANT, BILATERAL Bilateral    approx 2020   COLONOSCOPY  06/2020   ECTOPIC PREGNANCY SURGERY     1980s   FOOT SURGERY Bilateral 03/05/2017   @ [facility] by dr b. janit;   right 1st bunionectomy/  right 2nd hammertoe correction;  left and right 5th hammer toe correction   HAMMER TOE SURGERY Left 12/12/2022   Procedure: HAMMER TOE CORRECTION SECOND;  Surgeon: Janit Thresa HERO, DPM;  Location: [facility];  Service: Podiatry;  Laterality: Left;   PTOSIS REPAIR Left 12/25/2017   Procedure: INTERNAL PTOSIS REPAIR LEFT EYE;  Surgeon: Laurie Loyd Redhead, MD;  Location: [facility];  Service: Ophthalmology;  Laterality: Left;   TONSILLECTOMY     age 34   TOTAL HIP ARTHROPLASTY Left 2011    Current Outpatient Medications  Medication Sig Dispense Refill   Accu-Chek Softclix Lancets lancets Use as instructed 100 each 12   albuterol  (VENTOLIN  HFA) 108 (90 Base) MCG/ACT inhaler Inhale 2 puffs into the lungs every 4 (four) hours as needed for wheezing or shortness of breath. (Patient not taking: Reported on 08/05/2023) 1 each 2   aspirin  EC 81 MG tablet Take 81 mg by mouth daily. Swallow whole.  atorvastatin  (LIPITOR) 40 MG tablet Take 1 tablet (40 mg total) by mouth daily. 90 tablet 3   benzonatate  (TESSALON ) 100 MG capsule Take 1 capsule (100 mg total) by mouth 3 (three) times daily as needed for cough. (Patient not taking: Reported on 08/05/2023) 21 capsule 0   benztropine  (COGENTIN ) 1 MG tablet Take by mouth 2 (two) times daily. Per pt take 4 tabs in am and 1 tab at bedtime     Blood Glucose Monitoring Suppl (ACCU-CHEK AVIVA PLUS) w/Device KIT 1 application  by Does not apply route 4 (four) times  daily -  with meals and at bedtime. 1 kit 0   Blood Pressure Monitoring (BLOOD PRESSURE CUFF) MISC 1 each by Does not apply route daily. 1 each 0   carboxymethylcellulose (REFRESH PLUS) 0.5 % SOLN Place 1 drop into both eyes 3 (three) times daily as needed.     cetirizine  (ZYRTEC ) 10 MG tablet Take 1 tablet (10 mg total) by mouth daily as needed for allergies. 30 tablet 11   cloNIDine  (CATAPRES ) 0.1 MG tablet Take 0.1 mg by mouth 2 (two) times daily.     doxycycline  (VIBRAMYCIN ) 100 MG capsule Take 1 capsule (100 mg total) by mouth 2 (two) times daily. (Patient not taking: Reported on 08/05/2023) 20 capsule 0   fluticasone  (FLONASE ) 50 MCG/ACT nasal spray Place 2 sprays into both nostrils daily. 16 g 6   gabapentin  (NEURONTIN ) 100 MG capsule TAKE 1 CAPSULE(100 MG) BY MOUTH AT BEDTIME AS NEEDED 30 capsule 2   glucose blood (ACCU-CHEK AVIVA PLUS) test strip Use to check blood glucose up to four times daily 300 strip 11   insulin  glargine (LANTUS  SOLOSTAR) 100 UNIT/ML Solostar Pen Inject 10 Units into the skin daily. 15 mL 3   Insulin  Pen Needle (B-D ULTRAFINE III SHORT PEN) 31G X 8 MM MISC USE DAILY AS DIRECTED 100 each 1   Lancet Device MISC Check blood glucose once a day in the AM. 1 each 0   Lancet Devices (ACCU-CHEK SOFTCLIX) lancets Use as instructed 1 each 2   meloxicam  (MOBIC ) 15 MG tablet Take 1 tablet (15 mg total) by mouth daily. 60 tablet 1   pantoprazole  (PROTONIX ) 40 MG tablet TAKE 1 TABLET(40 MG) BY MOUTH DAILY 90 tablet 3   ranitidine  (ZANTAC ) 150 MG capsule Take 150 mg by mouth 2 (two) times daily.     triamcinolone  cream (KENALOG ) 0.1 % Apply 1 Application topically 2 (two) times daily. 30 g 0   valsartan -hydrochlorothiazide  (DIOVAN -HCT) 160-25 MG tablet TAKE 1 TABLET BY MOUTH DAILY 30 tablet 11   ziprasidone  (GEODON ) 20 MG capsule Take 20 mg by mouth daily with breakfast.     Current Facility-Administered Medications  Medication Dose Route Frequency Provider Last Rate Last Admin    erythromycin  ophthalmic ointment   Left Eye QHS        insulin  starter kit- pen needles (English) 1 kit  1 kit Other Once Glennon Lot, MD         ALLERGIES: Metformin  and related, Norvasc  [amlodipine ], and Omeprazole   Family History  Problem Relation Age of Onset   Heart attack Sister 75   Heart attack Brother 55   Heart attack Mother 32    Social History   Socioeconomic History   Marital status: Single    Spouse name: Not on file   Number of children: Not on file   Years of education: Not on file   Highest education level: Not on file  Occupational History  Not on file  Tobacco Use   Smoking status: Former    Current packs/day: 0.00    Types: Cigarettes    Start date: 47    Quit date: 2001    Years since quitting: 24.0    Passive exposure: Past   Smokeless tobacco: Never  Vaping Use   Vaping status: Never Used  Substance and Sexual Activity   Alcohol use: No    Comment: per pt quit 2001   Drug use: Not Currently    Comment: 12-04-2022  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   Sexual activity: Not Currently  Other Topics Concern   Not on file  Social History Narrative   She recently moved from [place] to [place] in 2017. 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   Social Drivers of Corporate Investment Banker Strain: Not on File (04/12/2018)   Received from [employer], [employer]    Financial Resource Strain: 0  Food Insecurity: Not on File (04/12/2018)   Received from [place], [place]   Food Insecurity    Food: 0  Transportation Needs: No Transportation Needs (05/10/2022)   PRAPARE - Administrator, Civil Service (Medical): No    Lack of Transportation (Non-Medical): No  Physical Activity: Not on File (04/12/2018)   Received from [place], [place]   Physical Activity    Physical Activity: 0  Stress: Not on File (04/12/2018)   Received from [facility] [place], [place]   Stress    Stress: 0  Social  Connections: Not on File (04/12/2018)   Received from [place], [place]   Social Connections    Social Connections and Isolation: 0  Intimate Partner Violence: Not on file    Review of Systems  PHYSICAL EXAMINATION:    There were no vitals taken for this visit.    General appearance: alert, cooperative and appears stated age Head: Normocephalic, without obvious abnormality, atraumatic Neck: no adenopathy, supple, symmetrical, trachea midline and thyroid normal to inspection and palpation Lungs: normal respiratory effort Heart: regular rate and rhythm Abdomen: soft, non-tender, no masses,  no organomegaly Extremities: extremities normal, atraumatic, no cyanosis or edema Skin: Skin color, texture, turgor normal. No rashes or lesions Lymph nodes: Cervical, supraclavicular, and axillary nodes normal. No abnormal inguinal nodes palpated Neurologic: Grossly normal  Pelvic: External genitalia: normal labia majora bilaterally Normal bilateral labia minotra Atrophic vaginal introitus and urethral meatus Blue lesion, ~0.5cm on inner right vaginal introitus, nontender and no collection palpated                 VAGINAL BIOPSY NOTE The indications for vulvar biopsy (rule out neoplasia, establish lichen sclerosus diagnosis) were reviewed.   Risks of the biopsy including pain, bleeding, infection, inadequate specimen, scarring and need for additional procedures  were discussed. The patient stated understanding and agreed to undergo procedure today. Consent was signed,  time out performed.  Chaperone was present during entire procedure. The patient's vagina was prepped with Betadine. 1% lidocaine  was injected into the right vaginal lesion. A 4-mm punch biopsy was done bu the skin was incompletely incised. The lesion was then elevated with pickups and removed sharply with scalpel. The biopsy tissue was picked up with sterile forceps and passed of for specimen.  Small bleeding was noted and hemostasis was  achieved using 3-0 vicryl in a continuous fashion.  The patient tolerated the procedure well. Post-procedure instructions  (pelvic rest for one week) were given  to the patient. The patient is to call with heavy bleeding, fever greater than 100.4, foul smelling vaginal discharge or other concerns. The patient will be return to clinic in two weeks for discussion of results.   Chaperone was present for exam  ASSESSMENT & PLAN  1. Vaginal lesion (Primary) Now s/p uncomplicated vaginal biopsy. Scheduled for 1 week follow up to assess biopsy site. Will follow up surgical pathology. Reviewed after care instructiosn  - Surgical pathology( [place]/ [facility])  2. Breast cancer screening by mammogram - MM 3D SCREENING MAMMOGRAM BILATERAL BREAST; Future   An After Visit Summary was printed and given to the patient.   Carter Quarry, MD, FACOG Minimally Invasive Gynecologic Surgery  Obstetrics and Gynecology, [facility], [facility] 08/27/2023

## 2023-08-27 ENCOUNTER — Other Ambulatory Visit (HOSPITAL_COMMUNITY)
Admission: RE | Admit: 2023-08-27 | Discharge: 2023-08-27 | Disposition: A | Discharge status: Home / Self Care | Payer: 59 | Source: Ambulatory Visit | Attending: Obstetrics and Gynecology | Admitting: Obstetrics and Gynecology

## 2023-08-27 ENCOUNTER — Ambulatory Visit (INDEPENDENT_AMBULATORY_CARE_PROVIDER_SITE_OTHER): Payer: 59 | Admitting: Obstetrics and Gynecology

## 2023-08-27 ENCOUNTER — Encounter: Payer: Self-pay | Admitting: Obstetrics and Gynecology

## 2023-08-27 VITALS — BP 132/81 | HR 73 | Wt 150.7 lb

## 2023-08-27 DIAGNOSIS — N898 Other specified noninflammatory disorders of vagina: Secondary | ICD-10-CM

## 2023-08-27 DIAGNOSIS — Z1231 Encounter for screening mammogram for malignant neoplasm of breast: Secondary | ICD-10-CM

## 2023-08-27 NOTE — Progress Notes (Signed)
 Pt presents for bartholin gland/vaginal lesion excision. No other questions or concerns today.

## 2023-08-29 LAB — SURGICAL PATHOLOGY

## 2023-09-01 ENCOUNTER — Ambulatory Visit: Payer: 59 | Admitting: Podiatry

## 2023-09-06 ENCOUNTER — Other Ambulatory Visit: Payer: Self-pay | Admitting: Student

## 2023-09-06 DIAGNOSIS — G6289 Other specified polyneuropathies: Secondary | ICD-10-CM

## 2023-09-08 ENCOUNTER — Ambulatory Visit (INDEPENDENT_AMBULATORY_CARE_PROVIDER_SITE_OTHER): Payer: 59 | Admitting: Podiatry

## 2023-09-08 ENCOUNTER — Ambulatory Visit (INDEPENDENT_AMBULATORY_CARE_PROVIDER_SITE_OTHER): Payer: 59

## 2023-09-08 DIAGNOSIS — S92325D Nondisplaced fracture of second metatarsal bone, left foot, subsequent encounter for fracture with routine healing: Secondary | ICD-10-CM

## 2023-09-08 NOTE — Progress Notes (Unsigned)
No chief complaint on file.   Subjective:  Patient presents today for evaluation of left foot pain secondary to a fall injury that the patient sustained on 06/22/2023.  She does have a surgical history of left great toe arthroplasty with implant and hammertoe correction of the second digit left foot.  DOS: 12/12/2022.  Patient states that she no longer has any pain or tenderness associated to the foot.  She has been wearing tennis shoes without pain.  No new complaints  Past Medical History:  Diagnosis Date   Chronic kidney disease    GERD (gastroesophageal reflux disease)    Hyperlipidemia associated with type 2 diabetes mellitus (HCC)    Hypertension    Liver disease    Schizophrenia [facility])     Past Surgical History:  Procedure Laterality Date   BUNIONECTOMY Left 12/12/2022   Procedure: KELLER BUNION IMPLANT;  Surgeon: Felecia Shelling, DPM;  Location: [facility];  Service: Podiatry;  Laterality: Left;   CAPSULOTOMY METATARSOPHALANGEAL Left 12/12/2022   Procedure: CAPSULOTOMY METATARSOPHALANGEAL SECOND;  Surgeon: Felecia Shelling, DPM;  Location: [facility];  Service: Podiatry;  Laterality: Left;   CATARACT EXTRACTION W/ INTRAOCULAR LENS  IMPLANT, BILATERAL Bilateral    approx 2020   COLONOSCOPY  06/2020   ECTOPIC PREGNANCY SURGERY     1980s   FOOT SURGERY Bilateral 03/05/2017   @ [facility] by dr b. Logan Bores;   right 1st bunionectomy/  right 2nd hammertoe correction;  left and right 5th hammer toe correction   HAMMER TOE SURGERY Left 12/12/2022   Procedure: HAMMER TOE CORRECTION SECOND;  Surgeon: Felecia Shelling, DPM;  Location: [facility];  Service: Podiatry;  Laterality: Left;   PTOSIS REPAIR Left 12/25/2017   Procedure: INTERNAL PTOSIS REPAIR LEFT EYE;  Surgeon: Floydene Flock, MD;  Location: [facility];  Service: Ophthalmology;  Laterality: Left;   TONSILLECTOMY     age 63   TOTAL HIP ARTHROPLASTY Left 2011    Allergies  Allergen Reactions    Metformin And Related Diarrhea and Other (See Comments)    GI symptoms including abdominal pain and diarrhea - Pt not interested in taking again   Norvasc [Amlodipine] Itching   Omeprazole Rash    Objective: Physical Exam General: The patient is alert and oriented x3 in no acute distress.  Dermatology: Skin is cool, dry and supple bilateral lower extremities. Negative for open lesions or macerations.  Vascular: Palpable pedal pulses bilaterally.  No erythema.  No edema noted to the foot.  Capillary refill WNL  Neurological: Grossly intact via light touch  Musculoskeletal Exam: No pedal deformity.  Today there is no tenderness with palpation along the base of the second metatarsal of the left foot   Radiographic Exam LT foot 07/02/2023:  Silastic implant good alignment well-seated into the first MTP.  Good alignment of the second ray.  Arthroplasty of the PIPJ of the second digit. New finding of a transverse fracture at the base of the second metatarsal metaphyseal diaphyseal junction.  Nondisplaced.  Radiographic exam LT foot 09/08/2023: Hypertrophic bone formation around the transverse stress fracture at the base of the second metatarsal.  There continues to be radiolucent fracture line with hypertrophic bone formation  Assessment: 1.  Closed nondisplaced fracture base of the second metatarsal left foot.  DOI: 06/22/2023  2.  H/o left great toe arthroplasty with implant.  Hammertoe repair second left. DOS: 12/12/2022  -Patient evaluated.  X-rays reviewed - Patient has been wearing  regular shoes without any pain or tenderness.  She may continue as long as there is no longer any pain -Return to clinic as needed  Felecia Shelling, DPM [facility]  Dr. Felecia Shelling, DPM    [address] [place], [place] [address]                Office [phone]  Fax [phone]

## 2023-09-24 ENCOUNTER — Encounter (HOSPITAL_COMMUNITY): Payer: Self-pay | Admitting: *Deleted

## 2023-09-24 ENCOUNTER — Emergency Department (HOSPITAL_COMMUNITY)
Admission: EM | Admit: 2023-09-24 | Discharge: 2023-09-25 | Disposition: A | Discharge status: Home / Self Care | Payer: 59 | Attending: Emergency Medicine | Admitting: Emergency Medicine

## 2023-09-24 ENCOUNTER — Other Ambulatory Visit: Payer: Self-pay

## 2023-09-24 DIAGNOSIS — Z20822 Contact with and (suspected) exposure to covid-19: Secondary | ICD-10-CM | POA: Insufficient documentation

## 2023-09-24 DIAGNOSIS — J069 Acute upper respiratory infection, unspecified: Secondary | ICD-10-CM | POA: Diagnosis not present

## 2023-09-24 DIAGNOSIS — Z7982 Long term (current) use of aspirin: Secondary | ICD-10-CM | POA: Diagnosis not present

## 2023-09-24 DIAGNOSIS — E1122 Type 2 diabetes mellitus with diabetic chronic kidney disease: Secondary | ICD-10-CM | POA: Insufficient documentation

## 2023-09-24 DIAGNOSIS — N189 Chronic kidney disease, unspecified: Secondary | ICD-10-CM | POA: Insufficient documentation

## 2023-09-24 DIAGNOSIS — R059 Cough, unspecified: Secondary | ICD-10-CM | POA: Diagnosis present

## 2023-09-24 DIAGNOSIS — Z794 Long term (current) use of insulin: Secondary | ICD-10-CM | POA: Insufficient documentation

## 2023-09-24 DIAGNOSIS — B9789 Other viral agents as the cause of diseases classified elsewhere: Secondary | ICD-10-CM | POA: Insufficient documentation

## 2023-09-24 LAB — RESP PANEL BY RT-PCR (RSV, FLU A&B, COVID)  RVPGX2
Influenza A by PCR: NEGATIVE
Influenza B by PCR: NEGATIVE
Resp Syncytial Virus by PCR: NEGATIVE
SARS Coronavirus 2 by RT PCR: NEGATIVE

## 2023-09-24 MED ORDER — ACETAMINOPHEN 325 MG PO TABS
650.0000 mg | ORAL_TABLET | Freq: Once | ORAL | Status: AC
  Administered 2023-09-24: 650 mg via ORAL
  Filled 2023-09-24: qty 2

## 2023-09-24 NOTE — ED Triage Notes (Signed)
The pt is c/o a cough just today  no other symptoms except a low temp here

## 2023-09-24 NOTE — ED Provider Triage Note (Signed)
 Emergency Medicine Provider Triage Evaluation Note  BIRDIA JAYCOX , a 63 y.o. female  was evaluated in triage.  Pt complains of cough, fever, chills, body aches.  Review of Systems  Positive: cough Negative: Chest pain, shob  Physical Exam  BP (!) 172/94 (BP Location: Left Arm)   Pulse 82   Temp 100 F (37.8 C) (Oral)   Resp 18   Ht 5' 1 (1.549 m)   Wt 68.4 kg   SpO2 98%   BMI 28.49 kg/m  Gen:   Awake, no distress   Resp:  Normal effort  MSK:   Moves extremities without difficulty  Other:  No acute respiratory distress, no wheezing, rhonchi  Medical Decision Making  Medically screening exam initiated at 6:12 PM.  Appropriate orders placed.  Lonisha FREEDA SPIVEY was informed that the remainder of the evaluation will be completed by another provider, this initial triage assessment does not replace that evaluation, and the importance of remaining in the ED until their evaluation is complete.  Workup initiated in triage    Rosan Sherlean DEL, [place] 09/24/23 1814

## 2023-09-24 NOTE — ED Notes (Signed)
 Patient reported to NT that she was having chest pain. Triage Rn aware, assessed, and performed EKG on patient.

## 2023-09-25 ENCOUNTER — Emergency Department (HOSPITAL_COMMUNITY): Payer: 59

## 2023-09-25 DIAGNOSIS — J069 Acute upper respiratory infection, unspecified: Secondary | ICD-10-CM | POA: Diagnosis not present

## 2023-09-25 NOTE — Discharge Instructions (Addendum)
 Follow up with your Physician for recheck

## 2023-09-25 NOTE — ED Notes (Signed)
 Assisted patient to call [employer] through [employer] for a ride to pharmacy then home covered by her insurance plan.

## 2023-09-25 NOTE — ED Provider Notes (Signed)
 [place] EMERGENCY DEPARTMENT AT [facility] Provider Note   CSN: [id number] Arrival date & time: 09/24/23  1717     History  Chief Complaint  Patient presents with   Cough    Kayla Gregory is a 63 y.o. female.  Patient complains of a cough and congestion.  Patient reports that she has a sore area on the right side of her neck.  Patient reports symptoms began today.  Patient denies nausea or vomiting.  Patient has not had a fever.  She denies any chills.  Patient has a past medical history of reflux disease diabetes peripheral neuropathy, chronic kidney disease and hyper lip patient on exposure to flu or COVID.  The history is provided by the patient. No language interpreter was used.  Cough      Home Medications Prior to Admission medications   Medication Sig Start Date End Date Taking? Authorizing Provider  Accu-Chek Softclix Lancets lancets Use as instructed 10/11/22   Cleotilde Perkins, DO  albuterol  (VENTOLIN  HFA) 108 (90 Base) MCG/ACT inhaler Inhale 2 puffs into the lungs every 4 (four) hours as needed for wheezing or shortness of breath. 07/18/23   Haze Lonni PARAS, MD  aspirin  EC 81 MG tablet Take 81 mg by mouth daily. Swallow whole.    [provider]  atorvastatin  (LIPITOR) 40 MG tablet Take 1 tablet (40 mg total) by mouth daily. 12/27/22   Cleotilde Perkins, DO  benzonatate  (TESSALON ) 100 MG capsule Take 1 capsule (100 mg total) by mouth 3 (three) times daily as needed for cough. 07/18/23   Haze Lonni PARAS, MD  benztropine  (COGENTIN ) 1 MG tablet Take by mouth 2 (two) times daily. Per pt take 4 tabs in am and 1 tab at bedtime    [provider]  Blood Glucose Monitoring Suppl (ACCU-CHEK AVIVA PLUS) w/Device KIT 1 application  by Does not apply route 4 (four) times daily -  with meals and at bedtime. 07/29/22   Cleotilde Perkins, DO  Blood Pressure Monitoring (BLOOD PRESSURE CUFF) MISC 1 each by Does not apply route daily. 07/29/22   Cleotilde Perkins, DO  carboxymethylcellulose (REFRESH PLUS) 0.5 % SOLN Place 1 drop into both eyes 3 (three) times daily as needed.    [provider]  cetirizine  (ZYRTEC ) 10 MG tablet Take 1 tablet (10 mg total) by mouth daily as needed for allergies. 05/21/23   Cleotilde Perkins, DO  cloNIDine  (CATAPRES ) 0.1 MG tablet Take 0.1 mg by mouth 2 (two) times daily.    [provider]  doxycycline  (VIBRAMYCIN ) 100 MG capsule Take 1 capsule (100 mg total) by mouth 2 (two) times daily. Patient not taking: Reported on 08/27/2023 07/18/23   Haze Lonni PARAS, MD  fluticasone  (FLONASE ) 50 MCG/ACT nasal spray Place 2 sprays into both nostrils daily. 12/27/22   Cleotilde Perkins, DO  gabapentin  (NEURONTIN ) 100 MG capsule TAKE 1 CAPSULE(100 MG) BY MOUTH AT BEDTIME AS NEEDED 09/08/23   Cleotilde Perkins, DO  glucose blood (ACCU-CHEK AVIVA PLUS) test strip Use to check blood glucose up to four times daily 10/11/22   Cleotilde Perkins, DO  insulin  glargine (LANTUS  SOLOSTAR) 100 UNIT/ML Solostar Pen Inject 10 Units into the skin daily. 07/11/23   Bryan Bianchi, MD  Insulin  Pen Needle (B-D ULTRAFINE III SHORT PEN) 31G X 8 MM MISC USE DAILY AS DIRECTED 10/11/22   Cleotilde Perkins, DO  Lancet Device MISC Check blood glucose once a day in the AM. 04/13/20   Chandra Toribio POUR, MD  Lancet Devices (ACCU-CHEK SOFTCLIX) lancets Use as instructed 11/22/16   Diallo, Irving, MD  meloxicam  (MOBIC ) 15 MG tablet Take 1 tablet (15 mg total) by mouth daily. 07/02/23 10/30/23  Janit Thresa HERO, DPM  pantoprazole  (PROTONIX ) 40 MG tablet TAKE 1 TABLET(40 MG) BY MOUTH DAILY 08/01/23   Rosendo Rush, MD  ranitidine  (ZANTAC ) 150 MG capsule Take 150 mg by mouth 2 (two) times daily. 05/23/15   [provider]  triamcinolone  cream (KENALOG ) 0.1 % Apply 1 Application topically 2 (two) times daily. 04/15/23   Cleotilde Perkins, DO  valsartan -hydrochlorothiazide  (DIOVAN -HCT) 160-25 MG tablet TAKE 1 TABLET BY MOUTH DAILY 05/20/23   Cleotilde Perkins, DO   ziprasidone  (GEODON ) 20 MG capsule Take 20 mg by mouth daily with breakfast.    [provider]      Allergies    Metformin  and related, Norvasc  [amlodipine ], and Omeprazole     Review of Systems   Review of Systems  Respiratory:  Positive for cough.   All other systems reviewed and are negative.   Physical Exam Updated Vital Signs BP (!) 188/80 (BP Location: Left Arm)   Pulse 84   Temp 99.7 F (37.6 C) (Oral)   Resp 16   Ht 5' 1 (1.549 m)   Wt 68.4 kg   SpO2 92%   BMI 28.49 kg/m  Physical Exam Vitals and nursing note reviewed.  Constitutional:      Appearance: She is well-developed.  HENT:     Head: Normocephalic.     Mouth/Throat:     Mouth: Mucous membranes are moist.  Cardiovascular:     Rate and Rhythm: Normal rate.  Pulmonary:     Effort: Pulmonary effort is normal.  Abdominal:     General: There is no distension.  Musculoskeletal:        General: No swelling. Normal range of motion.     Cervical back: Normal range of motion.  Skin:    General: Skin is warm.  Neurological:     General: No focal deficit present.     Mental Status: She is alert and oriented to person, place, and time.  Psychiatric:        Mood and Affect: Mood normal.     ED Results / Procedures / Treatments   Labs (all labs ordered are listed, but only abnormal results are displayed) Labs Reviewed  RESP PANEL BY RT-PCR (RSV, FLU A&B, COVID)  [id number]    EKG EKG Interpretation Date/Time:  Wednesday September 24 2023 22:59:46 EST Ventricular Rate:  75 PR Interval:  118 QRS Duration:  72 QT Interval:  382 QTC Calculation: 426 R Axis:   69  Text Interpretation: Normal sinus rhythm Nonspecific T wave abnormality Abnormal ECG When compared with ECG of 28-Dec-2022 14:49, Axis has shifted rightward Confirmed by Raford Lenis ([id number]) on 09/25/2023 2:48:35 AM  Radiology DG Chest 2 View Result Date: 09/25/2023 CLINICAL DATA:  63 year old female with cough and chest pain, chills.  EXAM: CHEST - 2 VIEW COMPARISON:  Chest radiographs 07/18/2023 and earlier. FINDINGS: Upright AP and lateral views 0722 hours. Lower lung volumes compared to priors. Mediastinal contours remain within normal limits. Visualized tracheal air column is within normal limits. Stable lung markings when allowing for some crowding. No pneumothorax, pulmonary edema, pleural effusion or confluent opacity. No acute osseous abnormality identified. Abdominal Calcified aortic atherosclerosis. Negative visible bowel gas. IMPRESSION: Lower lung volumes, otherwise no acute cardiopulmonary abnormality. Electronically Signed   By: VEAR Hurst M.D.   On: 09/25/2023 07:38  Procedures Procedures    Medications Ordered in ED Medications  acetaminophen  (TYLENOL ) tablet 650 mg (650 mg Oral Given 09/24/23 1858)    ED Course/ Medical Decision Making/ A&P                                 Medical Decision Making patient complains of cough and congestion.  Patient reports symptoms began yesterday.  Amount and/or Complexity of Data Reviewed Labs: ordered. Decision-making details documented in ED Course.    Details: COVID influenza and RSV are negative Radiology: ordered and independent interpretation performed. Decision-making details documented in ED Course.    Details: Chest x-ray no pneumonia.  Risk OTC drugs. Risk Details: Patient advised Tylenol  for body aches over-the-counter cough medicines follow-up with primary care for recheck.           Final Clinical Impression(s) / ED Diagnoses Final diagnoses:  Viral upper respiratory tract infection    Rx / DC Orders ED Discharge Orders     None     An After Visit Summary was printed and given to the patient.     Flint Sonny POUR, PA-C 09/25/23 0846    Kingsley, Victoria K, DO 09/25/23 [phone]

## 2023-09-30 ENCOUNTER — Other Ambulatory Visit: Payer: Self-pay

## 2023-09-30 DIAGNOSIS — I10 Essential (primary) hypertension: Secondary | ICD-10-CM

## 2023-09-30 DIAGNOSIS — K219 Gastro-esophageal reflux disease without esophagitis: Secondary | ICD-10-CM

## 2023-09-30 DIAGNOSIS — G6289 Other specified polyneuropathies: Secondary | ICD-10-CM

## 2023-10-01 MED ORDER — VALSARTAN-HYDROCHLOROTHIAZIDE 160-25 MG PO TABS: 1.0000 | ORAL_TABLET | Freq: Every day | ORAL | 11 refills | Status: DC

## 2023-10-01 MED ORDER — PANTOPRAZOLE SODIUM 40 MG PO TBEC: 40.0000 mg | DELAYED_RELEASE_TABLET | Freq: Every day | ORAL | 3 refills | Status: DC

## 2023-10-01 MED ORDER — GABAPENTIN 100 MG PO CAPS: 100.0000 mg | ORAL_CAPSULE | Freq: Three times a day (TID) | ORAL | 2 refills | Status: DC

## 2023-10-01 MED ORDER — ATORVASTATIN CALCIUM 40 MG PO TABS: 40.0000 mg | ORAL_TABLET | Freq: Every day | ORAL | 3 refills | Status: DC

## 2023-10-13 ENCOUNTER — Ambulatory Visit: Payer: 59 | Admitting: Pharmacist

## 2023-10-28 ENCOUNTER — Ambulatory Visit: Payer: 59 | Admitting: Student

## 2023-11-03 ENCOUNTER — Ambulatory Visit (INDEPENDENT_AMBULATORY_CARE_PROVIDER_SITE_OTHER): Payer: 59 | Admitting: Student

## 2023-11-03 ENCOUNTER — Encounter: Payer: Self-pay | Admitting: Student

## 2023-11-03 VITALS — BP 144/70 | HR 60 | Ht 61.0 in | Wt 152.4 lb

## 2023-11-03 DIAGNOSIS — Z794 Long term (current) use of insulin: Secondary | ICD-10-CM | POA: Diagnosis not present

## 2023-11-03 DIAGNOSIS — E1100 Type 2 diabetes mellitus with hyperosmolarity without nonketotic hyperglycemic-hyperosmolar coma (NKHHC): Secondary | ICD-10-CM

## 2023-11-03 DIAGNOSIS — I10 Essential (primary) hypertension: Secondary | ICD-10-CM

## 2023-11-03 DIAGNOSIS — M542 Cervicalgia: Secondary | ICD-10-CM | POA: Insufficient documentation

## 2023-11-03 LAB — POCT GLYCOSYLATED HEMOGLOBIN (HGB A1C): HbA1c, POC (controlled diabetic range): 7.5 % — AB (ref 0.0–7.0)

## 2023-11-03 MED ORDER — BD PEN NEEDLE SHORT U/F 31G X 8 MM MISC: 1 refills | Status: AC

## 2023-11-03 MED ORDER — LANTUS SOLOSTAR 100 UNIT/ML ~~LOC~~ SOPN: 10.0000 [IU] | PEN_INJECTOR | Freq: Every day | SUBCUTANEOUS | 3 refills | Status: AC

## 2023-11-03 MED ORDER — ACCU-CHEK AVIVA PLUS W/DEVICE KIT: 1.0000 | PACK | Freq: Three times a day (TID) | 0 refills | Status: AC

## 2023-11-03 MED ORDER — ACCU-CHEK AVIVA PLUS VI STRP: ORAL_STRIP | 11 refills | Status: AC

## 2023-11-03 MED ORDER — ACCU-CHEK SOFTCLIX LANCETS MISC: 12 refills | Status: AC

## 2023-11-03 NOTE — Assessment & Plan Note (Signed)
 A1c checked at [facility] earlier this week.  Elevated from prior however patient brings in home blood sugar readings.  Morning fasting sugars have been within goal of high 90s to low 100s.  Will keep insulin at current dose and work on lifestyle and diet changes.

## 2023-11-03 NOTE — Patient Instructions (Addendum)
 It was great to see you today!   Future Appointments  Date Time Provider Department Center  11/03/2023  8:50 AM Glendale Chard, DO [facility]    Please arrive 15 minutes before your appointment to ensure smooth check in process.    Please call the clinic at [phone] if your symptoms worsen or you have any concerns.  Thank you for allowing me to participate in your care, Dr. Glendale Chard [facility]    For your pain you can try several over the counter topical medications. Topical medications are a great option to treat pain as you can place the medication right where you are hurting and they have less side effects.   Voltaren Gel/Diclofenac Gel: this is an anti-inflammatory cream (same ingredient as Motrin/Ibuprofen/Aleve) can be applied up to 4 times per day.

## 2023-11-03 NOTE — Progress Notes (Signed)
    SUBJECTIVE:   CHIEF COMPLAINT / HPI:   Kayla Gregory is a 63 y.o. female  presenting for   Type 2 Diabetes: Home medications include: 10 units at night. Does endorse compliance. Home glucose monitoring is performed sporadically. Checks at home and has been in 90 to low 100s. Unable to check since last Friday.   Most recent A1Cs:  Lab Results  Component Value Date   HGBA1C 7.5 (A) 10/28/2023   HGBA1C 8.7 (A) 07/11/2023   Last Microalbumin, LDL, Creatinine: Lab Results  Component Value Date   MICROALBUR 150 07/14/2019   LDLCALC 91 04/03/2021   CREATININE 5.50 (H) 07/17/2023    Patient is up to date on diabetic eye. Patient is up to date on diabetic foot exam.  Neck pain: over 3 months duration. On the right side. Limits ROM when turning to the right. Worse when coughing.   PERTINENT  PMH / PSH: Reviewed and updated   OBJECTIVE:   BP (!) 144/70   Pulse 60   Ht 5\' 1"  (1.549 m)   Wt 152 lb 6 oz (69.1 kg)   SpO2 99%   BMI 28.79 kg/m   Chronically ill-appearing, no acute distress Cardio: Regular rate, regular rhythm, no murmurs on exam. Pulm: Clear, no wheezing, no crackles. No increased work of breathing Abdominal: bowel sounds present, soft, non-tender, non-distended Extremities: no peripheral edema  Neuro: alert and oriented x3, speech normal in content, no facial asymmetry, strength intact and equal bilaterally in UE and LE, pupils equal and reactive to light.  Psych:  Cognition and judgment appear intact. Alert, communicative  and cooperative with normal attention span and concentration. No apparent delusions, illusions, hallucinations      11/03/2023    8:28 AM 08/05/2023    9:24 AM 07/11/2023    8:28 AM  PHQ9 SCORE ONLY  PHQ-9 Total Score 0 0 0      ASSESSMENT/PLAN:   Essential hypertension Blood pressure not within goal today of less than 140/90.  Patient reported that she does not take her blood pressure medicines before coming to the physician.   Patient instructed to start taking her blood pressure medicines before she comes to the doctor's office otherwise we will have to make changes to her medications.  DM (diabetes mellitus) (HCC) A1c checked at [facility] earlier this week.  Elevated from prior however patient brings in home blood sugar readings.  Morning fasting sugars have been within goal of high 90s to low 100s.  Will keep insulin at current dose and work on lifestyle and diet changes.  Neck pain Most likely neck pain is related to muscle strain as it is related to range of motion restriction.  Per patient request will check thyroid.     Glendale Chard, DO [facility]

## 2023-11-03 NOTE — Assessment & Plan Note (Signed)
 Blood pressure not within goal today of less than 140/90.  Patient reported that she does not take her blood pressure medicines before coming to the physician.  Patient instructed to start taking her blood pressure medicines before she comes to the doctor's office otherwise we will have to make changes to her medications.

## 2023-11-03 NOTE — Assessment & Plan Note (Signed)
 Most likely neck pain is related to muscle strain as it is related to range of motion restriction.  Per patient request will check thyroid.

## 2023-11-04 ENCOUNTER — Encounter: Payer: Self-pay | Admitting: Student

## 2023-11-04 LAB — TSH RFX ON ABNORMAL TO FREE T4: TSH: 3.77 u[IU]/mL (ref 0.450–4.500)

## 2023-11-09 ENCOUNTER — Other Ambulatory Visit: Payer: Self-pay | Admitting: Podiatry

## 2023-12-11 ENCOUNTER — Other Ambulatory Visit: Payer: Self-pay | Admitting: Student

## 2023-12-11 DIAGNOSIS — G6289 Other specified polyneuropathies: Secondary | ICD-10-CM

## 2024-01-12 ENCOUNTER — Encounter (HOSPITAL_COMMUNITY): Payer: Self-pay

## 2024-01-12 ENCOUNTER — Emergency Department (HOSPITAL_COMMUNITY)
Admission: EM | Admit: 2024-01-12 | Discharge: 2024-01-12 | Disposition: A | Discharge status: Home / Self Care | Attending: Emergency Medicine | Admitting: Emergency Medicine

## 2024-01-12 ENCOUNTER — Emergency Department (HOSPITAL_COMMUNITY)

## 2024-01-12 ENCOUNTER — Other Ambulatory Visit: Payer: Self-pay

## 2024-01-12 DIAGNOSIS — E1122 Type 2 diabetes mellitus with diabetic chronic kidney disease: Secondary | ICD-10-CM | POA: Insufficient documentation

## 2024-01-12 DIAGNOSIS — I158 Other secondary hypertension: Secondary | ICD-10-CM

## 2024-01-12 DIAGNOSIS — N189 Chronic kidney disease, unspecified: Secondary | ICD-10-CM | POA: Insufficient documentation

## 2024-01-12 DIAGNOSIS — I129 Hypertensive chronic kidney disease with stage 1 through stage 4 chronic kidney disease, or unspecified chronic kidney disease: Secondary | ICD-10-CM | POA: Diagnosis not present

## 2024-01-12 DIAGNOSIS — R1031 Right lower quadrant pain: Secondary | ICD-10-CM | POA: Insufficient documentation

## 2024-01-12 LAB — CBC WITH DIFFERENTIAL/PLATELET
Abs Immature Granulocytes: 0.01 10*3/uL (ref 0.00–0.07)
Basophils Absolute: 0 10*3/uL (ref 0.0–0.1)
Basophils Relative: 0 %
Eosinophils Absolute: 0.1 10*3/uL (ref 0.0–0.5)
Eosinophils Relative: 2 %
HCT: 31 % — ABNORMAL LOW (ref 36.0–46.0)
Hemoglobin: 10.4 g/dL — ABNORMAL LOW (ref 12.0–15.0)
Immature Granulocytes: 0 %
Lymphocytes Relative: 39 %
Lymphs Abs: 1.4 10*3/uL (ref 0.7–4.0)
MCH: 31.8 pg (ref 26.0–34.0)
MCHC: 33.5 g/dL (ref 30.0–36.0)
MCV: 94.8 fL (ref 80.0–100.0)
Monocytes Absolute: 0.3 10*3/uL (ref 0.1–1.0)
Monocytes Relative: 8 %
Neutro Abs: 1.8 10*3/uL (ref 1.7–7.7)
Neutrophils Relative %: 51 %
Platelets: 201 10*3/uL (ref 150–400)
RBC: 3.27 MIL/uL — ABNORMAL LOW (ref 3.87–5.11)
RDW: 12.7 % (ref 11.5–15.5)
WBC: 3.6 10*3/uL — ABNORMAL LOW (ref 4.0–10.5)
nRBC: 0 % (ref 0.0–0.2)

## 2024-01-12 LAB — URINALYSIS, W/ REFLEX TO CULTURE (INFECTION SUSPECTED)
Bacteria, UA: NONE SEEN
Bilirubin Urine: NEGATIVE
Glucose, UA: 150 mg/dL — AB
Ketones, ur: NEGATIVE mg/dL
Leukocytes,Ua: NEGATIVE
Nitrite: NEGATIVE
Protein, ur: >=300 mg/dL — AB
Specific Gravity, Urine: 1.01 (ref 1.005–1.030)
pH: 7 (ref 5.0–8.0)

## 2024-01-12 LAB — COMPREHENSIVE METABOLIC PANEL WITH GFR
ALT: 25 U/L (ref 0–44)
AST: 34 U/L (ref 15–41)
Albumin: 3.7 g/dL (ref 3.5–5.0)
Alkaline Phosphatase: 114 U/L (ref 38–126)
Anion gap: 7 (ref 5–15)
BUN: 54 mg/dL — ABNORMAL HIGH (ref 8–23)
CO2: 24 mmol/L (ref 22–32)
Calcium: 9.4 mg/dL (ref 8.9–10.3)
Chloride: 107 mmol/L (ref 98–111)
Creatinine, Ser: 5.15 mg/dL — ABNORMAL HIGH (ref 0.44–1.00)
GFR, Estimated: 9 mL/min — ABNORMAL LOW (ref >60)
Glucose, Bld: 158 mg/dL — ABNORMAL HIGH (ref 70–99)
Potassium: 4.8 mmol/L (ref 3.5–5.1)
Sodium: 138 mmol/L (ref 135–145)
Total Bilirubin: 0.9 mg/dL (ref 0.0–1.2)
Total Protein: 6.9 g/dL (ref 6.5–8.1)

## 2024-01-12 LAB — LIPASE, BLOOD: Lipase: 64 U/L — ABNORMAL HIGH (ref 11–51)

## 2024-01-12 MED ORDER — HYDRALAZINE HCL 20 MG/ML IJ SOLN
10.0000 mg | Freq: Once | INTRAMUSCULAR | Status: AC
  Administered 2024-01-12: 10 mg via INTRAVENOUS
  Filled 2024-01-12: qty 1

## 2024-01-12 MED ORDER — SENNOSIDES-DOCUSATE SODIUM 8.6-50 MG PO TABS: 1.0000 | ORAL_TABLET | Freq: Every evening | ORAL | 0 refills | Status: AC | PRN

## 2024-01-12 MED ORDER — DICYCLOMINE HCL 20 MG PO TABS: 20.0000 mg | ORAL_TABLET | Freq: Three times a day (TID) | ORAL | 0 refills | Status: DC | PRN

## 2024-01-12 MED ORDER — POLYETHYLENE GLYCOL 3350 17 G PO PACK: 17.0000 g | PACK | Freq: Every day | ORAL | 0 refills | Status: AC

## 2024-01-12 NOTE — Discharge Instructions (Signed)
 You were seen in the emergency department today with abdominal pain.  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  This may be causing your symptoms.  I have called in some medicines to the pharmacy to help with this.  Please continue to follow with your kidney doctors at [facility].  Your kidney function (creatinine) is 5.15 today.   Return to the ED with any new/worsening pain, numbness, weakness, severe headache, or chest pain.

## 2024-01-12 NOTE — ED Notes (Signed)
 Patient discharged by RN. Patient in wheelchair to lobby at time of discharge. To be picked up by niece.

## 2024-01-12 NOTE — ED Triage Notes (Signed)
 Patient arrives via [facility] for hypertension and abdominal/flank pain both right sided. Hx of flank and abdominal pain- not on dialysis, in kidney failure. R sided flank and abdominal pain x1 month, worsening today. Now complaining of headache and spots in vision. Neg stroke screen.   EMS vitals 240/100 EKG normal No meds given en route Unsuccessful IV start

## 2024-01-12 NOTE — ED Notes (Signed)
 Patient transported to CT

## 2024-01-12 NOTE — ED Provider Notes (Signed)
 Emergency Department Provider Note   I have reviewed the triage vital signs and the nursing notes.   HISTORY  Chief Complaint Hypertension   HPI Kayla Gregory is a 63 y.o. female with PMH of HTN, DM, CKD awaiting transplant, HLD, and schizophrenia presents to the ED with worsening right flank pain.  Patient has had around 1 month of symptoms.  She tells me that she is followed at [facility] who are planning for MRI of her abdomen.  She describes a cyst on the right kidney which she believes is causing her pain.  Denies any injury.  No hematuria.  She continues to urinate normally without fever or dysuria.  No chest pain or chills.  She does have frequent floaters which is not unusual for her.  She sees ophthalmology and is followed regularly for this.  No acute change in her vision.  No severe headaches, chest pain, shortness of breath.  She notes that her blood pressure was significantly elevated but did not take her BP meds this morning.   Past Medical History:  Diagnosis Date   Chronic kidney disease    GERD (gastroesophageal reflux disease)    Hyperlipidemia associated with type 2 diabetes mellitus (HCC)    Hypertension    Liver disease    Schizophrenia (HCC)     Review of Systems  Constitutional: No fever/chills Cardiovascular: Denies chest pain. Respiratory: Denies shortness of breath. Gastrointestinal: Positive right flank/abdominal pain.  No nausea, no vomiting.  No diarrhea. Musculoskeletal: Negative for back pain. Skin: Negative for rash. Neurological: Negative for headaches.  ____________________________________________   PHYSICAL EXAM:  VITAL SIGNS: ED Triage Vitals  Encounter Vitals Group     BP 01/12/24 1121 (!) 215/84     Pulse Rate 01/12/24 1115 (!) 57     Resp 01/12/24 1115 19     Temp 01/12/24 1121 98.1 F (36.7 C)     Temp Source 01/12/24 1121 Oral     SpO2 01/12/24 1115 100 %     Weight 01/12/24 1101 158 lb (71.7 kg)     Height 01/12/24 1101 5'  1" (1.549 m)   Constitutional: Alert and oriented. Well appearing and in no acute distress. Eyes: Conjunctivae are normal.  Head: Atraumatic. Nose: No congestion/rhinnorhea. Mouth/Throat: Mucous membranes are moist. Neck: No stridor.  Cardiovascular: Normal rate, regular rhythm. Good peripheral circulation. Grossly normal heart sounds.   Respiratory: Normal respiratory effort.  No retractions. Lungs CTAB. Gastrointestinal: Soft and nontender. No distention.  Musculoskeletal: No lower extremity tenderness nor edema. No gross deformities of extremities. Neurologic:  Normal speech and language. No gross focal neurologic deficits are appreciated.  Skin:  Skin is warm, dry and intact. No rash noted.   ____________________________________________   LABS (all labs ordered are listed, but only abnormal results are displayed)  Labs Reviewed  COMPREHENSIVE METABOLIC PANEL WITH GFR - Abnormal; Notable for the following components:      Result Value   Glucose, Bld 158 (*)    BUN 54 (*)    Creatinine, Ser 5.15 (*)    GFR, Estimated 9 (*)    All other components within normal limits  LIPASE, BLOOD - Abnormal; Notable for the following components:   Lipase 64 (*)    All other components within normal limits  CBC WITH DIFFERENTIAL/PLATELET - Abnormal; Notable for the following components:   WBC 3.6 (*)    RBC 3.27 (*)    Hemoglobin 10.4 (*)    HCT 31.0 (*)  All other components within normal limits  URINALYSIS, W/ REFLEX TO CULTURE (INFECTION SUSPECTED) - Abnormal; Notable for the following components:   Color, Urine STRAW (*)    Glucose, UA 150 (*)    Hgb urine dipstick SMALL (*)    Protein, ur >=300 (*)    All other components within normal limits   ____________________________________________  EKG   EKG Interpretation Date/Time:  Monday Jan 12 2024 11:10:35 EDT Ventricular Rate:  62 PR Interval:  153 QRS Duration:  85 QT Interval:  388 QTC Calculation: 394 R  Axis:   17  Text Interpretation: Sinus rhythm Low voltage, precordial leads Borderline T wave abnormalities Confirmed by Abby Hocking [phone]) on 01/12/2024 11:44:06 AM        ____________________________________________  RADIOLOGY  CT ABDOMEN PELVIS WO CONTRAST Result Date: 01/12/2024 CLINICAL DATA:  Abdominal pain. EXAM: CT ABDOMEN AND PELVIS WITHOUT CONTRAST TECHNIQUE: Multidetector CT imaging of the abdomen and pelvis was performed following the standard protocol without IV contrast. RADIATION DOSE REDUCTION: This exam was performed according to the departmental dose-optimization program which includes automated exposure control, adjustment of the mA and/or kV according to patient size and/or use of iterative reconstruction technique. COMPARISON:  None Available. FINDINGS: Lower chest: Minimal dependent subsegmental atelectasis or scarring. No pericardial or pleural effusions. Hepatobiliary: No focal liver abnormality is seen. No gallstones, gallbladder wall thickening, or biliary dilatation. Pancreas: Unremarkable. No pancreatic ductal dilatation or surrounding inflammatory changes. Spleen: Normal in size without focal abnormality. Adrenals/Urinary Tract: 2.5 cm cyst right kidney. No nephrolithiasis or hydronephrosis. No adrenal lesions. Stomach/Bowel: Stomach is within normal limits. Appendix appears normal. No evidence of bowel wall thickening, distention, or inflammatory changes. Increased stool consistent with constipation. Vascular/Lymphatic: Aortic atherosclerosis. No enlarged abdominal or pelvic lymph nodes. Reproductive: No masses or fluid collections Other: No abdominal wall hernia or abnormality. No abdominopelvic ascites. Musculoskeletal: Grade 1 anterolisthesis L5. L5-S1 vacuum disc changes. T11-T12 lucencies possibly large Schmorl's nodes. Consider correlation with MRI. IMPRESSION: 1. Constipation. 2. Right renal cyst. 3. Lumbar spine findings as described. Consider correlation with MRI.  4. Aortic atherosclerosis (ICD10-I70.0). Electronically Signed   By: Sydell Eva M.D.   On: 01/12/2024 12:06    ____________________________________________   PROCEDURES  Procedure(s) performed:   Procedures   ____________________________________________   INITIAL IMPRESSION / ASSESSMENT AND PLAN / ED COURSE  Pertinent labs & imaging results that were available during my care of the patient were reviewed by me and considered in my medical decision making (see chart for details).   This patient is Presenting for Evaluation of abdominal pain, which does require a range of treatment options, and is a complaint that involves a high risk of morbidity and mortality.  The Differential Diagnoses includes but is not exclusive to acute cholecystitis, intrathoracic causes for epigastric abdominal pain, gastritis, duodenitis, pancreatitis, small bowel or large bowel obstruction, abdominal aortic aneurysm, hernia, gastritis, etc.   Critical Interventions-    Medications  hydrALAZINE (APRESOLINE) injection 10 mg (10 mg Intravenous Given 01/12/24 1133)    Reassessment after intervention: BP improved.    Clinical Laboratory Tests Ordered, included CBC without leukocytosis.  Mild anemia at 10.4.  Creatinine at 5.15 slightly improved from prior values.  Normal electrolytes.  No acidosis.  UA without infection.  Radiologic Tests Ordered, included CT abdomen/pelvis. I independently interpreted the images and agree with radiology interpretation.   Cardiac Monitor Tracing which shows NSR.    Social Determinants of Health Risk patient is not an active smoker.  Medical Decision Making: Summary:  Patient presents to the emergency department for evaluation of abdominal pain.  Known CKD currently on the transplant list at [facility] but not on dialysis.  Plan for screening blood work to ensure no need for emergent dialysis.  She is hypertensive here but has not taken her home medications.  I will give  hydralazine here to lower BP.  No focal deficit.   Reevaluation with update and discussion with patient.  CT is unremarkable.  BP improved after hydralazine.  She will continue her home blood pressure medications and continue her follow-up with [facility] regarding her abdominal MRI which is currently planned as an outpatient.  I do not see any emergent indication to complete that today.  Considered admission but workup is reassuring. No evidence of acute HTN emergency at this time. Plan to treat constipation at home.   Patient's presentation is most consistent with acute presentation with potential threat to life or bodily function.   Disposition: discharge  ____________________________________________  FINAL CLINICAL IMPRESSION(S) / ED DIAGNOSES  Final diagnoses:  Right lower quadrant abdominal pain  Other secondary hypertension     NEW OUTPATIENT MEDICATIONS STARTED DURING THIS VISIT:  Discharge Medication List as of 01/12/2024 12:59 PM     START taking these medications   Details  dicyclomine (BENTYL) 20 MG tablet Take 1 tablet (20 mg total) by mouth 3 (three) times daily as needed for spasms., Starting Mon 01/12/2024, Normal    polyethylene glycol (MIRALAX) 17 g packet Take 17 g by mouth daily., Starting Mon 01/12/2024, Normal    senna-docusate (SENOKOT-S) 8.6-50 MG tablet Take 1 tablet by mouth at bedtime as needed for mild constipation or moderate constipation., Starting Mon 01/12/2024, Normal        Note:  This document was prepared using Dragon voice recognition software and may include unintentional dictation errors.  Abby Hocking, MD, [facility] Emergency Medicine    Tika Hannis, Shereen Dike, MD 01/13/24 [phone]

## 2024-01-24 ENCOUNTER — Encounter (HOSPITAL_COMMUNITY): Payer: Self-pay

## 2024-01-24 ENCOUNTER — Ambulatory Visit (HOSPITAL_COMMUNITY): Admission: EM | Admit: 2024-01-24 | Discharge: 2024-01-24 | Disposition: A | Discharge status: Home / Self Care

## 2024-01-24 ENCOUNTER — Emergency Department (HOSPITAL_COMMUNITY)
Admission: EM | Admit: 2024-01-24 | Discharge: 2024-01-24 | Discharge status: Against Medical Advice | Attending: Emergency Medicine | Admitting: Emergency Medicine

## 2024-01-24 DIAGNOSIS — R109 Unspecified abdominal pain: Secondary | ICD-10-CM | POA: Diagnosis not present

## 2024-01-24 DIAGNOSIS — M25551 Pain in right hip: Secondary | ICD-10-CM | POA: Diagnosis present

## 2024-01-24 DIAGNOSIS — W19XXXA Unspecified fall, initial encounter: Secondary | ICD-10-CM | POA: Diagnosis not present

## 2024-01-24 DIAGNOSIS — Z5321 Procedure and treatment not carried out due to patient leaving prior to being seen by health care provider: Secondary | ICD-10-CM | POA: Insufficient documentation

## 2024-01-24 DIAGNOSIS — M79671 Pain in right foot: Secondary | ICD-10-CM | POA: Insufficient documentation

## 2024-01-24 MED ORDER — LIDOCAINE 5 % EX PTCH: 1.0000 | MEDICATED_PATCH | CUTANEOUS | 0 refills | Status: DC

## 2024-01-24 MED ORDER — DICLOFENAC SODIUM 1 % EX GEL: 2.0000 g | Freq: Four times a day (QID) | CUTANEOUS | 0 refills | Status: AC

## 2024-01-24 NOTE — ED Provider Notes (Signed)
 [facility]    CSN: [id number] Arrival date & time: 01/24/24  1006      History   Chief Complaint Chief Complaint  Patient presents with   Fall    HPI Kayla Gregory is a 63 y.o. female.   Patient presents with right sided low back and flank pain after a fall that occurred on 6/4.  Patient states that she was walking and tripped over a wooden chair and fell onto the chair.  Patient denies hitting her head or loss of consciousness.  Patient denies any other injuries from the fall.  Patient states that she has been using her friend's Voltaren  gel with relief of the pain.  Patient states that she tried to get the over-the-counter version and this did not help as much and she is requesting a prescription of this.  Denies any urinary symptoms, abdominal pain, or fever.  The history is provided by the patient and medical records.  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   Peripheral neuropathy 10/04/2019   CKD (chronic kidney disease), stage IV (HCC) 04/06/2019   Chronic sinusitis 12/14/2018   Chronic left shoulder pain 12/27/2016   Stable angina (HCC) 12/22/2015   GERD (gastroesophageal reflux disease) 12/22/2015   Chronic hepatitis C (HCC) 12/22/2015   DM (diabetes mellitus) (HCC) 03/31/2014   Essential hypertension 03/31/2014    Past Surgical History:  Procedure Laterality Date   BUNIONECTOMY Left 12/12/2022   Procedure: KELLER BUNION IMPLANT;   Surgeon: Dot Gazella, DPM;  Location: [facility];  Service: Podiatry;  Laterality: Left;   CAPSULOTOMY METATARSOPHALANGEAL Left 12/12/2022   Procedure: CAPSULOTOMY METATARSOPHALANGEAL SECOND;  Surgeon: Dot Gazella, DPM;  Location: [facility];  Service: Podiatry;  Laterality: Left;   CATARACT EXTRACTION W/ INTRAOCULAR LENS  IMPLANT, BILATERAL Bilateral    approx 2020   COLONOSCOPY  06/2020   ECTOPIC PREGNANCY SURGERY     1980s   FOOT SURGERY Bilateral 03/05/2017   @ [facility] by dr b. Luster Salters;   right 1st bunionectomy/  right 2nd hammertoe correction;  left and right 5th hammer toe correction   HAMMER TOE SURGERY Left 12/12/2022   Procedure: HAMMER TOE CORRECTION SECOND;  Surgeon: Dot Gazella, DPM;  Location: [facility];  Service: Podiatry;  Laterality: Left;   PTOSIS REPAIR Left 12/25/2017   Procedure: INTERNAL PTOSIS REPAIR LEFT EYE;  Surgeon: Karan Osgood, MD;  Location: [facility];  Service: Ophthalmology;  Laterality: Left;   TONSILLECTOMY     age 64   TOTAL HIP ARTHROPLASTY Left 2011    OB History   No obstetric history on file.      Home Medications    Prior to Admission medications   Medication Sig Start Date End Date Taking? Authorizing Provider  diclofenac  Sodium (VOLTAREN  ARTHRITIS PAIN) 1 % GEL Apply 2 g topically 4 (four) times daily. 01/24/24  Yes Karon Packer, NP  lidocaine  (LIDODERM ) 5 % Place 1 patch onto the skin daily. Remove & Discard patch within 12 hours or as directed by MD 01/24/24  Yes Levora Reas A, NP  Accu-Chek Softclix Lancets lancets Use as instructed 11/03/23   Clem Currier, DO  aspirin  EC 81 MG tablet Take 81 mg by mouth daily. Swallow whole.    [provider]  atorvastatin  (LIPITOR) 40 MG tablet Take 1 tablet (40 mg total) by mouth daily. 10/01/23   Clem Currier, DO  benztropine (COGENTIN) 1 MG tablet Take by mouth 2 (two) times daily. Per pt take 4 tabs in am and 1 tab at bedtime     [provider]  Blood Glucose Monitoring Suppl (ACCU-CHEK AVIVA PLUS) w/Device KIT 1 application  by Does not apply route 4 (four) times daily -  with meals and at bedtime. 11/03/23   Clem Currier, DO  Blood Pressure Monitoring (BLOOD PRESSURE CUFF) MISC 1 each by Does not apply route daily. 07/29/22   Clem Currier, DO  carboxymethylcellulose (REFRESH PLUS) 0.5 % SOLN Place 1 drop into both eyes 3 (three) times daily as needed.    [provider]  cetirizine  (ZYRTEC ) 10 MG tablet Take 1 tablet (10 mg total) by mouth daily as needed for allergies. 05/21/23   Clem Currier, DO  cloNIDine (CATAPRES) 0.1 MG tablet Take 0.1 mg by mouth 2 (two) times daily.    [provider]  FLUoxetine  (PROZAC ) 10 MG capsule Take 10 mg by mouth daily.    [provider]  gabapentin  (NEURONTIN ) 100 MG capsule TAKE 1 CAPSULE(100 MG) BY MOUTH AT BEDTIME AS NEEDED 12/11/23   Clem Currier, DO  glucose blood (ACCU-CHEK AVIVA PLUS) test strip Use to check blood glucose up to four times daily 11/03/23   Clem Currier, DO  insulin  glargine (LANTUS  SOLOSTAR) 100 UNIT/ML Solostar Pen Inject 10 Units into the skin daily. 11/03/23   Clem Currier, DO  Insulin  Pen Needle (B-D ULTRAFINE III SHORT PEN) 31G X 8 MM MISC USE DAILY AS DIRECTED 11/03/23   Clem Currier, DO  Lancet Device MISC Check blood glucose once a day in the AM. 04/13/20   Laneta Pintos, MD  Lancet Devices [facility]) lancets Use as instructed 11/22/16   Diallo, Abdoulaye, MD  meloxicam  (MOBIC ) 15 MG tablet TAKE 1 TABLET(15 MG) BY MOUTH DAILY 11/10/23   Dot Gazella, DPM  pantoprazole  (PROTONIX ) 40 MG tablet Take 1 tablet (40 mg total) by mouth daily. 10/01/23   Clem Currier, DO  polyethylene glycol (MIRALAX ) 17 g packet Take 17 g by mouth daily. 01/12/24   Long, Shereen Dike, MD  senna-docusate (SENOKOT-S) 8.6-50 MG tablet Take 1 tablet by mouth at bedtime as needed for mild constipation or moderate constipation. 01/12/24   Long,  Joshua G, MD  traZODone (DESYREL) 50 MG tablet Take 50 mg by mouth at bedtime.    [provider]  triamcinolone  cream (KENALOG ) 0.1 % Apply 1 Application topically 2 (two) times daily. 04/15/23   Clem Currier, DO  valsartan -hydrochlorothiazide  (DIOVAN -HCT) 160-25 MG tablet Take 1 tablet by mouth daily. 10/01/23   Clem Currier, DO  ziprasidone  (GEODON ) 20 MG capsule Take 20 mg by mouth daily with breakfast.    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     Allergies   Metformin  and related, Norvasc  [amlodipine ], and Omeprazole    Review of Systems Review of Systems  Per HPI  Physical Exam Triage Vital Signs ED Triage Vitals  Encounter Vitals Group     BP 01/24/24 1023 (!) 159/67     Systolic BP Percentile --      Diastolic BP Percentile --      Pulse Rate 01/24/24 1023 65     Resp 01/24/24 1023 16     Temp 01/24/24 1023 97.8 F (36.6 C)     Temp Source 01/24/24 1023 Oral     SpO2 01/24/24 1023 94 %     Weight --      Height --      Head Circumference --      Peak Flow --      Pain Score 01/24/24 1024 10     Pain Loc --      Pain Education --      Exclude from Growth Chart --    No data found.  Updated Vital Signs BP (!) 159/67 (BP Location: Right Arm)   Pulse 65   Temp 97.8 F (36.6 C) (Oral)   Resp 16   SpO2 94%   Visual Acuity Right Eye Distance:   Left Eye  Distance:   Bilateral Distance:    Right Eye Near:   Left Eye Near:    Bilateral Near:     Physical Exam Vitals and nursing note reviewed.  Constitutional:      General: She is awake. She is not in acute distress.    Appearance: Normal appearance. She is well-developed and well-groomed. She is not ill-appearing.  Abdominal:     Tenderness: There is no right CVA tenderness or left CVA tenderness.  Musculoskeletal:     Cervical back: Normal.     Thoracic back: Tenderness present. No swelling, edema, deformity, signs of trauma, lacerations or bony tenderness. Normal range of motion.     Lumbar back: Tenderness present. No swelling, edema, deformity, signs of trauma, lacerations or bony tenderness. Normal range of motion. Negative right straight leg raise test and negative left straight leg raise test.       Back:     Comments: Tenderness noted to right mid and low back.  Without swelling, bruising, ecchymosis, obvious trauma, or decreased range of motion.  Skin:    General: Skin is warm and dry.  Neurological:     Mental Status: She is alert.  Psychiatric:        Behavior: Behavior is cooperative.      UC Treatments / Results  Labs (all labs ordered are listed, but only abnormal results are displayed) Labs Reviewed - No data to display  EKG   Radiology No results found.  Procedures Procedures (including critical care time)  Medications Ordered in UC Medications - No data to display  Initial Impression / Assessment and Plan / UC Course  I have  reviewed the triage vital signs and the nursing notes.  Pertinent labs & imaging results that were available during my care of the patient were reviewed by me and considered in my medical decision making (see chart for details).     Patient is well-appearing.  Vitals are stable.  There is tenderness noted to the right mid to low back.  Without swelling, bruising, ecchymosis, obvious trauma, or decreased range of  motion.  Prescribed Voltaren  gel and lidocaine  patches as needed pain.  Recommended taking Tylenol  as needed for pain as well.  Discussed follow-up and return precautions. Final Clinical Impressions(s) / UC Diagnoses   Final diagnoses:  Fall, initial encounter  Acute right flank pain     Discharge Instructions      You can apply Voltaren  gel up to 4 times daily as needed for pain. You can also apply lidocaine  patch for 12 hours at a time once daily to help with additional pain relief. Otherwise you can take extra strength 500 mg Tylenol  every 6-8 hours.  Do not exceed 4000 mg in 1 day. Alternate between heat and ice as needed for pain.  And do some gentle stretching to avoid becoming stiff. Follow-up with [facility] if your pain continues. Follow-up with primary care provider or return here as needed.  ED Prescriptions     Medication Sig Dispense Auth. Provider   diclofenac  Sodium (VOLTAREN  ARTHRITIS PAIN) 1 % GEL Apply 2 g topically 4 (four) times daily. 50 g Levora Reas A, NP   lidocaine  (LIDODERM ) 5 % Place 1 patch onto the skin daily. Remove & Discard patch within 12 hours or as directed by MD 30 patch Karon Packer, NP      PDMP not reviewed this encounter.   Levora Reas A, NP 01/24/24 [phone]

## 2024-01-24 NOTE — ED Notes (Signed)
 Pt became angry in triage stating she has great insurance and she is not going to wait in the lobby, this triage nurse explained that unfortunately there was a wait and I was not going to be able to get her in a room right away. Pt states this is ridiculous this is why I hate [facility]. Pt c/o that she had pain and difficulty walking but was able to walk out of triage swiftly fussing out the staff.

## 2024-01-24 NOTE — ED Triage Notes (Signed)
 Patient here today with c/o right side LB pain and right foot pain after falling on Wednesday.

## 2024-01-24 NOTE — ED Triage Notes (Signed)
 Pt arrived POV from home c/o a fall that happened on Labor day. Pt is still having right hip pain and right foot pain.

## 2024-01-24 NOTE — Discharge Instructions (Signed)
 You can apply Voltaren  gel up to 4 times daily as needed for pain. You can also apply lidocaine  patch for 12 hours at a time once daily to help with additional pain relief. Otherwise you can take extra strength 500 mg Tylenol  every 6-8 hours.  Do not exceed 4000 mg in 1 day. Alternate between heat and ice as needed for pain.  And do some gentle stretching to avoid becoming stiff. Follow-up with [facility] if your pain continues. Follow-up with primary care provider or return here as needed.

## 2024-02-02 ENCOUNTER — Ambulatory Visit (INDEPENDENT_AMBULATORY_CARE_PROVIDER_SITE_OTHER): Admitting: Student

## 2024-02-02 ENCOUNTER — Encounter: Payer: Self-pay | Admitting: Student

## 2024-02-02 VITALS — BP 152/72 | HR 62 | Ht 61.0 in | Wt 142.8 lb

## 2024-02-02 DIAGNOSIS — Z794 Long term (current) use of insulin: Secondary | ICD-10-CM | POA: Diagnosis not present

## 2024-02-02 DIAGNOSIS — E1100 Type 2 diabetes mellitus with hyperosmolarity without nonketotic hyperglycemic-hyperosmolar coma (NKHHC): Secondary | ICD-10-CM | POA: Diagnosis not present

## 2024-02-02 DIAGNOSIS — I10 Essential (primary) hypertension: Secondary | ICD-10-CM | POA: Diagnosis not present

## 2024-02-02 LAB — POCT GLYCOSYLATED HEMOGLOBIN (HGB A1C): HbA1c, POC (controlled diabetic range): 6.4 % (ref 0.0–7.0)

## 2024-02-02 NOTE — Patient Instructions (Addendum)
 Pleasure to meet you today.  Your A1c today improved to 6.4% and within goal.  Please continue to take your medications as prescribed.  Your foot exam today was normal  Today we collected lab to check your A1c and your kidney function.

## 2024-02-02 NOTE — Progress Notes (Addendum)
    SUBJECTIVE:   CHIEF COMPLAINT / HPI:   63 year old female with history of type 2 diabetes Presenting today for diabetes follow-up Current medication includes Lantus  10 units daily  Other medication including gabapentin  and Lipitor. Denies any hyperglycemic episodes or hypoglycemic episodes.  Patient expressed frustrations about not being started on medication that would protect her kidney.  Stated she follows with nephrologist who will not start her on this medication which I suspect is SGLT [place]).  I attempted to explain to patient these medications are mostly initiated to prevent advanced kidney disease and given that her kidney disease is somewhat advanced it would be futile to start her on those medication now. However patient remains frustrated and didn't want to engage in any further conversation and wants to get on with her annual diabetes foot exam.  PERTINENT  PMH / PSH: Reviewed   OBJECTIVE:   BP (!) 152/72   Pulse 62   Ht 5' 1 (1.549 m)   Wt 142 lb 12.8 oz (64.8 kg)   SpO2 97%   BMI 26.98 kg/m    Physical Exam General: Alert, well appearing, NAD Cardiovascular: RRR, No Murmurs, Normal S2/S2 Respiratory: CTAB, No wheezing or Rales Abdomen: No distension or tenderness Extremities: +2 pedal plantar pulses, no lesions, NVI intact.  ASSESSMENT/PLAN:   Essential hypertension BP elevated.  Patient reports home Bps within normal range.  Refused to recheck BP and to discuss about blood pressure issues due to patient being unhappy about conversion surrounding farxiga.  Patient encouraged to continue daily blood pressure monitoring and to follow-up with PCP for BP control if needed.  DM (diabetes mellitus) (HCC) Well-controlled.  A1c today was 6.4%.  Endorses good tolerance to current treatment regimen. - Continue medications as prescribed - Foot exam completed, normal. - A1c and microalbuminuria obtained.   General health maintenance Pneumococcal  vaccine offered today, patient declined.   Norleen April, MD [facility]

## 2024-02-02 NOTE — Assessment & Plan Note (Signed)
 BP elevated.  Patient reports home Bps within normal range.  Refused to recheck BP and to discuss about blood pressure issues due to patient being unhappy about conversion surrounding farxiga.  Patient encouraged to continue daily blood pressure monitoring and to follow-up with PCP for BP control if needed.

## 2024-02-02 NOTE — Assessment & Plan Note (Signed)
 Well-controlled.  A1c today was 6.4%.  Endorses good tolerance to current treatment regimen. - Continue medications as prescribed - Foot exam completed, normal. - A1c and microalbuminuria obtained.

## 2024-02-03 LAB — MICROALBUMIN / CREATININE URINE RATIO
Creatinine, Urine: 67.9 mg/dL
Microalb/Creat Ratio: 2175 mg/g{creat} — ABNORMAL HIGH (ref 0–29)
Microalbumin, Urine: 1476.8 ug/mL

## 2024-02-16 NOTE — Progress Notes (Signed)
 Department of Pharmacy  Pre-listing Transplant Pharmacy Review    The pre-transplant chart for Altheia Kuba has been reviewed for a current medication list to evaluate for potential concerns/contraindications with kidney transplant.    Medication List as of 02/16/2024: Current Outpatient Medications on File Prior to Visit  Medication Sig Dispense Refill  . aspirin  81 MG EC tablet Take 81 mg by mouth once daily    . atorvastatin  (LIPITOR) 40 MG tablet Take 40 mg by mouth once daily    . calcitRIOL  (ROCALTROL ) 0.25 MCG capsule Take 0.25 mcg by mouth once daily    . cholecalciferol 1000 unit tablet Take 1,000 Units by mouth 2 (two) times daily (Patient not taking: Reported on 10/28/2023)    . cloNIDine HCL (CATAPRES) 0.1 MG tablet Take 0.1 mg by mouth 2 (two) times daily    . FLUoxetine  (PROZAC ) 20 MG capsule Take 20 mg by mouth every morning    . gabapentin  (NEURONTIN ) 100 MG capsule Take 100 mg by mouth at bedtime    . insulin  GLARGINE (LANTUS ) injection (concentration 100 units/mL) Inject 10 Units subcutaneously once daily    . loratadine  (CLARITIN ) 10 mg capsule Take 10 mg by mouth once daily (Patient not taking: Reported on 10/28/2023)    . traZODone (DESYREL) 50 MG tablet Take 50 mg by mouth at bedtime Pt unsure of dosage    . valsartan -hydroCHLOROthiazide  (DIOVAN -HCT) 160-25 mg tablet Take 1 tablet by mouth once daily     No current facility-administered medications on file prior to visit.    Allergies/Reactions/Severity: Omeprazole   Pertinent Labs: Recipient CMV Status CMV IgG Lab Results  Component Value Date   CMVIGG Positive (!) 10/28/2023   CMVIGG Positive (!) 08/01/2022   CMV IgM Lab Results  Component Value Date   CMVM Negative 10/28/2023   CMVM Negative 08/01/2022     Recipient EBV Status EBV Ab/VCA IgG Lab Results  Component Value Date   VCAIGG Positive (!) 10/28/2023   VCAIGG Positive (!) 08/01/2022   EBV Ab/VCA IgM Lab Results  Component Value Date    VCAIGM Negative 10/28/2023   VCAIGM Equivocal (!) 08/01/2022     EBV Ab/EBNA Lab Results  Component Value Date   EBNA Positive (!) 10/28/2023   EBNA Positive (!) 08/01/2022   EBV Ab EA/IgG Lab Results  Component Value Date   EBVEAIGG Negative 10/28/2023   EBVEAIGG Negative 08/01/2022     Antibody screen Lab Results  Component Value Date   PRAAB 0 01/21/2024   PRAAB 0 10/28/2023   PRAAB 0 11/14/2022   PRAAB 0 08/01/2022   Lab Results  Component Value Date   ABII 0 01/21/2024   ABII 0 10/28/2023   ABII 0 11/14/2022   ABII 0 08/01/2022   No results found for: PRABC1 No results found for: PRABC2  Body Mass Index Estimated body mass index is 28.58 kg/m as calculated from the following:   Height as of 10/28/23: 153.1 cm (5' 0.28).   Weight as of 10/28/23: 67 kg (147 lb 11.3 oz).  Evaluation:  Based on above list patient currently has no pharmaceutical contraindications to kidney transplant. Recommendations for post-transplant pharmacologic optimization include:  Immunosuppression plan: - Preferred regimen: If available at the time of transplant, the patient medically qualifies for the belatacept protocol due to the following: age < 70, BMI < 35, recipient EBV seropositivity, and no history of FSGS. This protocol includes alemtuzumab induction with belatacept and sirolimus maintenance therapy. However, insurance coverage will need to be vetted for  candidacy. Patients with Medicare and Medicaid are not eligible for manufacturer copay assistance, and therefore may be responsible for high monthly coinsurance payments with belatacept.   - Alternative regimen: Patient is low immunologic risk and will require methylprednisolone induction and standard maintenance immunosuppression with tacrolimus, mycophenolate and prednisone.    CMV Prophylaxis: Patient will not require CMV prophylaxis but instead will receive HSV prophylaxis with acyclovir for 3 months. If  alemtuzumab/belatacept immunosuppression is chosen this will change to valganciclovir for 6 months.   Antithrombotic:  - Not currently on anticoagulation  Drug Interactions:  - No significant drug interactions  Allergies:  - Allergies reviewed. No concerns.   Other: - Recommend holding valsartan  post-operatively given risk of additive nephrotoxicity with ACEi/ARBs and tacrolimus  - Recommend avoiding NSAIDs after kidney transplant. Patient is currently taking meloxicam  15 mg about once daily per fill history  ALEC MARTSCHENKO, PharmD

## 2024-02-19 ENCOUNTER — Telehealth: Payer: Self-pay | Admitting: Podiatry

## 2024-02-19 NOTE — Telephone Encounter (Signed)
 Case Manger requesting prescription for diabetic shoes to be faxed to [facility].

## 2024-03-05 ENCOUNTER — Other Ambulatory Visit: Payer: Self-pay | Admitting: Student

## 2024-03-05 DIAGNOSIS — I10 Essential (primary) hypertension: Secondary | ICD-10-CM

## 2024-03-05 DIAGNOSIS — G6289 Other specified polyneuropathies: Secondary | ICD-10-CM

## 2024-03-08 ENCOUNTER — Other Ambulatory Visit: Payer: Self-pay | Admitting: Family Medicine

## 2024-03-08 NOTE — Telephone Encounter (Signed)
 Patient needs follow up for chronic conditions, specifically her HTN that was uncontrolled at last visit. I have refilled chronic medications for a one-month supply.  Admin team: can we get her scheduled soon with PCP for follow up?

## 2024-03-10 ENCOUNTER — Other Ambulatory Visit: Payer: Self-pay | Admitting: Podiatry

## 2024-03-22 ENCOUNTER — Ambulatory Visit (INDEPENDENT_AMBULATORY_CARE_PROVIDER_SITE_OTHER): Admitting: Podiatry

## 2024-03-22 DIAGNOSIS — M2140 Flat foot [pes planus] (acquired), unspecified foot: Secondary | ICD-10-CM | POA: Diagnosis not present

## 2024-03-22 DIAGNOSIS — E0843 Diabetes mellitus due to underlying condition with diabetic autonomic (poly)neuropathy: Secondary | ICD-10-CM | POA: Diagnosis not present

## 2024-03-22 MED ORDER — CYCLOBENZAPRINE HCL 10 MG PO TABS: 10.0000 mg | ORAL_TABLET | Freq: Every day | ORAL | 0 refills | Status: AC

## 2024-03-22 NOTE — Progress Notes (Signed)
 Chief Complaint  Patient presents with   Shoes    Patient is here today for diabetic shoe evaluation. She would like to get a better pair of shoes due to her diabetes. Last A1c: 6.2. No anticoag.     Subjective:  Patient presents today for evaluation of bilateral lower extremity pain.  History of multiple surgeries to the left foot.  Today she is requesting diabetic shoes with insoles.  Patient also experiences leg cramping at night when she goes to bed.  Past Medical History:  Diagnosis Date   Chronic kidney disease    GERD (gastroesophageal reflux disease)    Hyperlipidemia associated with type 2 diabetes mellitus (HCC)    Hypertension    Liver disease    Schizophrenia [facility])     Past Surgical History:  Procedure Laterality Date   BUNIONECTOMY Left 12/12/2022   Procedure: KELLER BUNION IMPLANT;  Surgeon: Janit Thresa HERO, DPM;  Location: [facility] [place];  Service: Podiatry;  Laterality: Left;   CAPSULOTOMY METATARSOPHALANGEAL Left 12/12/2022   Procedure: CAPSULOTOMY METATARSOPHALANGEAL SECOND;  Surgeon: Janit Thresa HERO, DPM;  Location: [facility] [place];  Service: Podiatry;  Laterality: Left;   CATARACT EXTRACTION W/ INTRAOCULAR LENS  IMPLANT, BILATERAL Bilateral    approx 2020   COLONOSCOPY  06/2020   ECTOPIC PREGNANCY SURGERY     1980s   FOOT SURGERY Bilateral 03/05/2017   @ [facility] by dr b. janit;   right 1st bunionectomy/  right 2nd hammertoe correction;  left and right 5th hammer toe correction   HAMMER TOE SURGERY Left 12/12/2022   Procedure: HAMMER TOE CORRECTION SECOND;  Surgeon: Janit Thresa HERO, DPM;  Location: [facility] [place];  Service: Podiatry;  Laterality: Left;   PTOSIS REPAIR Left 12/25/2017   Procedure: INTERNAL PTOSIS REPAIR LEFT EYE;  Surgeon: Laurie Loyd Redhead, MD;  Location: [facility] OR;  Service: Ophthalmology;  Laterality: Left;   TONSILLECTOMY     age 41   TOTAL HIP ARTHROPLASTY Left 2011    Allergies  Allergen Reactions    Metformin  And Related Diarrhea and Other (See Comments)    GI symptoms including abdominal pain and diarrhea - Pt not interested in taking again   Norvasc  [Amlodipine ] Itching   Omeprazole  Rash, Dermatitis and Other (See Comments)    Denies airway involvement    Objective: Physical Exam General: The patient is alert and oriented x3 in no acute distress.  Dermatology: Skin is cool, dry and supple bilateral lower extremities. Negative for open lesions or macerations.  Vascular: Palpable pedal pulses bilaterally.  No erythema.  No edema noted to the foot.  Capillary refill WNL  Neurological: Light touch and protective threshold diminished  Musculoskeletal Exam: Pes planovalgus deformity noted bilateral lower extremities.  Generalized diffuse foot pain with weightbearing   Radiographic Exam LT foot 07/02/2023:  Silastic implant good alignment well-seated into the first MTP.  Good alignment of the second ray.  Arthroplasty of the PIPJ of the second digit. New finding of a transverse fracture at the base of the second metatarsal metaphyseal diaphyseal junction.  Nondisplaced.  Radiographic exam LT foot 09/08/2023: Hypertrophic bone formation around the transverse stress fracture at the base of the second metatarsal.  There continues to be radiolucent fracture line with hypertrophic bone formation  Assessment: 1.  H/o Closed nondisplaced fracture base of the second metatarsal left foot.  DOI: 06/22/2023  2.  H/o left great toe arthroplasty with implant.  Hammertoe repair second left. DOS: 12/12/2022 3.  Diabetes mellitus  with peripheral polyneuropathy 4.  Pes planovalgus deformity bilateral 5.  Nocturnal leg cramps bilateral lower extremities secondary to sciatica  -Patient evaluated.   -Comprehensive diabetic foot exam performed today -Order placed for diabetic shoes with custom molded Plastizote insoles x 3 to [facility] -prescription for Flexeril  10 mg  nightly -Return to clinic annually  Thresa EMERSON Sar, DPM [facility]  Dr. Thresa EMERSON Sar, DPM    [address] [place], [place] [address]                Office [phone]  Fax [phone]

## 2024-04-08 ENCOUNTER — Other Ambulatory Visit: Payer: Self-pay | Admitting: Family Medicine

## 2024-04-08 DIAGNOSIS — G6289 Other specified polyneuropathies: Secondary | ICD-10-CM

## 2024-04-11 ENCOUNTER — Other Ambulatory Visit: Payer: Self-pay | Admitting: Family Medicine

## 2024-04-11 DIAGNOSIS — I10 Essential (primary) hypertension: Secondary | ICD-10-CM

## 2024-04-12 ENCOUNTER — Other Ambulatory Visit: Payer: Self-pay | Admitting: Student

## 2024-04-12 DIAGNOSIS — I10 Essential (primary) hypertension: Secondary | ICD-10-CM

## 2024-04-21 ENCOUNTER — Encounter: Payer: Self-pay | Admitting: Student

## 2024-04-21 ENCOUNTER — Ambulatory Visit (INDEPENDENT_AMBULATORY_CARE_PROVIDER_SITE_OTHER): Admitting: Student

## 2024-04-21 VITALS — BP 140/78 | HR 64 | Ht 61.0 in | Wt 140.6 lb

## 2024-04-21 DIAGNOSIS — N184 Chronic kidney disease, stage 4 (severe): Secondary | ICD-10-CM | POA: Diagnosis not present

## 2024-04-21 DIAGNOSIS — Z23 Encounter for immunization: Secondary | ICD-10-CM | POA: Diagnosis not present

## 2024-04-21 DIAGNOSIS — E785 Hyperlipidemia, unspecified: Secondary | ICD-10-CM

## 2024-04-21 DIAGNOSIS — I10 Essential (primary) hypertension: Secondary | ICD-10-CM

## 2024-04-21 DIAGNOSIS — E1169 Type 2 diabetes mellitus with other specified complication: Secondary | ICD-10-CM | POA: Diagnosis not present

## 2024-04-21 NOTE — Assessment & Plan Note (Signed)
 Recheck lipid panel today.

## 2024-04-21 NOTE — Assessment & Plan Note (Addendum)
 Elevated in the office however with consistent normal home readings and reliable patient, will continue with current therapy at this time. If her pressures become more difficult to control can consider amlodipine  2.5-5 mg daily.

## 2024-04-21 NOTE — Patient Instructions (Addendum)
 I have ordered blood work. Unfortunately our office is not available for you to get blood work today. I recommend going to the following [facility] corp location. You do not need an appointment.   [facility] [address] [place],  [place]  [address] Labs: [address]  Main: [phone]  Sunday: Closed Monday: 6:00 AM - 6:00 PM Tuesday: 6:00 AM - 6:00 PM Wednesday: 6:00 AM - 6:00 PM Thursday: 6:00 AM - 6:00 PM Friday: 6:00 AM - 6:00 PM Saturday: Closed    No future appointments.  Please arrive 15 minutes before your appointment to ensure smooth check in process.    Please call the clinic at [phone] if your symptoms worsen or you have any concerns.  Thank you for allowing me to participate in your care, Dr. Damien Pinal [facility]

## 2024-04-21 NOTE — Assessment & Plan Note (Signed)
 Recheck BMP today  Not uremic on exam  Continues to follow renal diet Has follow up with nephrology in January Goal is to prolong initiation of dialysis as long as possible

## 2024-04-21 NOTE — Progress Notes (Signed)
    SUBJECTIVE:   CHIEF COMPLAINT / HPI:   Kayla Gregory is a 63 y.o. female presenting for follow up of hypertension and kidney disease.   HTN:  Currently on Valsartan -hydrochlorothiazide  for BP control. Checks her blood pressure daily at home with upper arm cuff. Usually runs 120s/60s. No reported lows. Follows renal diet.   PERTINENT  PMH / PSH: reviewed and updated.  OBJECTIVE:   BP (!) 140/78   Pulse 64   Ht 5' 1 (1.549 m)   Wt 140 lb 9.6 oz (63.8 kg)   SpO2 100%   BMI 26.57 kg/m   Well-appearing, no acute distress Cardio: Regular rate, regular rhythm, no murmurs on exam. Pulm: Clear, no wheezing, no crackles. No increased work of breathing Abdominal: bowel sounds present, soft, non-tender, non-distended Extremities: no peripheral edema   ASSESSMENT/PLAN:   Assessment & Plan CKD (chronic kidney disease), stage IV (HCC) Recheck BMP today  Not uremic on exam  Continues to follow renal diet Has follow up with nephrology in January Goal is to prolong initiation of dialysis as long as possible   Hyperlipidemia associated with type 2 diabetes mellitus (HCC) Recheck lipid panel today  Encounter for immunization Flu shot administered  Essential hypertension Elevated in the office however with consistent normal home readings and reliable patient, will continue with current therapy at this time. If her pressures become more difficult to control can consider amlodipine  2.5-5 mg daily.    Damien Pinal, DO [facility]

## 2024-04-22 LAB — BASIC METABOLIC PANEL WITH GFR
BUN/Creatinine Ratio: 10 — ABNORMAL LOW (ref 12–28)
BUN: 67 mg/dL — ABNORMAL HIGH (ref 8–27)
CO2: 22 mmol/L (ref 20–29)
Calcium: 9.3 mg/dL (ref 8.7–10.3)
Chloride: 102 mmol/L (ref 96–106)
Creatinine, Ser: 7.05 mg/dL — ABNORMAL HIGH (ref 0.57–1.00)
Glucose: 77 mg/dL (ref 70–99)
Potassium: 4.7 mmol/L (ref 3.5–5.2)
Sodium: 141 mmol/L (ref 134–144)
eGFR: 6 mL/min/1.73 — ABNORMAL LOW (ref >59)

## 2024-04-22 LAB — LIPID PANEL
Chol/HDL Ratio: 2.3 ratio (ref 0.0–4.4)
Cholesterol, Total: 125 mg/dL (ref 100–199)
HDL: 54 mg/dL (ref >39)
LDL Chol Calc (NIH): 60 mg/dL (ref 0–99)
Triglycerides: 47 mg/dL (ref 0–149)
VLDL Cholesterol Cal: 11 mg/dL (ref 5–40)

## 2024-04-23 ENCOUNTER — Telehealth: Payer: Self-pay

## 2024-04-23 ENCOUNTER — Emergency Department (HOSPITAL_COMMUNITY)

## 2024-04-23 ENCOUNTER — Other Ambulatory Visit: Payer: Self-pay

## 2024-04-23 ENCOUNTER — Inpatient Hospital Stay (HOSPITAL_COMMUNITY)
Admission: EM | Admit: 2024-04-23 | Discharge: 2024-04-29 | DRG: 539 | Disposition: A | Discharge status: Home / Self Care | Attending: Family Medicine | Admitting: Family Medicine

## 2024-04-23 ENCOUNTER — Encounter (HOSPITAL_COMMUNITY): Payer: Self-pay | Admitting: Emergency Medicine

## 2024-04-23 DIAGNOSIS — M549 Dorsalgia, unspecified: Secondary | ICD-10-CM

## 2024-04-23 DIAGNOSIS — M4644 Discitis, unspecified, thoracic region: Principal | ICD-10-CM | POA: Diagnosis present

## 2024-04-23 DIAGNOSIS — Z66 Do not resuscitate: Secondary | ICD-10-CM | POA: Diagnosis present

## 2024-04-23 DIAGNOSIS — M4634 Infection of intervertebral disc (pyogenic), thoracic region: Principal | ICD-10-CM | POA: Diagnosis present

## 2024-04-23 DIAGNOSIS — Z5941 Food insecurity: Secondary | ICD-10-CM

## 2024-04-23 DIAGNOSIS — N185 Chronic kidney disease, stage 5: Secondary | ICD-10-CM | POA: Diagnosis present

## 2024-04-23 DIAGNOSIS — E785 Hyperlipidemia, unspecified: Secondary | ICD-10-CM | POA: Diagnosis present

## 2024-04-23 DIAGNOSIS — N179 Acute kidney failure, unspecified: Secondary | ICD-10-CM | POA: Diagnosis present

## 2024-04-23 DIAGNOSIS — F209 Schizophrenia, unspecified: Secondary | ICD-10-CM | POA: Diagnosis present

## 2024-04-23 DIAGNOSIS — M40204 Unspecified kyphosis, thoracic region: Secondary | ICD-10-CM | POA: Diagnosis present

## 2024-04-23 DIAGNOSIS — B9689 Other specified bacterial agents as the cause of diseases classified elsewhere: Secondary | ICD-10-CM | POA: Diagnosis present

## 2024-04-23 DIAGNOSIS — N184 Chronic kidney disease, stage 4 (severe): Secondary | ICD-10-CM

## 2024-04-23 DIAGNOSIS — Z79899 Other long term (current) drug therapy: Secondary | ICD-10-CM

## 2024-04-23 DIAGNOSIS — Z9842 Cataract extraction status, left eye: Secondary | ICD-10-CM

## 2024-04-23 DIAGNOSIS — D631 Anemia in chronic kidney disease: Secondary | ICD-10-CM | POA: Diagnosis present

## 2024-04-23 DIAGNOSIS — E1122 Type 2 diabetes mellitus with diabetic chronic kidney disease: Secondary | ICD-10-CM | POA: Diagnosis present

## 2024-04-23 DIAGNOSIS — Z789 Other specified health status: Secondary | ICD-10-CM

## 2024-04-23 DIAGNOSIS — Z791 Long term (current) use of non-steroidal anti-inflammatories (NSAID): Secondary | ICD-10-CM

## 2024-04-23 DIAGNOSIS — Z888 Allergy status to other drugs, medicaments and biological substances status: Secondary | ICD-10-CM

## 2024-04-23 DIAGNOSIS — M4854XA Collapsed vertebra, not elsewhere classified, thoracic region, initial encounter for fracture: Secondary | ICD-10-CM | POA: Diagnosis present

## 2024-04-23 DIAGNOSIS — N186 End stage renal disease: Secondary | ICD-10-CM | POA: Diagnosis not present

## 2024-04-23 DIAGNOSIS — Z96642 Presence of left artificial hip joint: Secondary | ICD-10-CM | POA: Diagnosis present

## 2024-04-23 DIAGNOSIS — M4804 Spinal stenosis, thoracic region: Secondary | ICD-10-CM | POA: Diagnosis present

## 2024-04-23 DIAGNOSIS — N200 Calculus of kidney: Secondary | ICD-10-CM | POA: Diagnosis present

## 2024-04-23 DIAGNOSIS — M4624 Osteomyelitis of vertebra, thoracic region: Secondary | ICD-10-CM | POA: Diagnosis present

## 2024-04-23 DIAGNOSIS — E1169 Type 2 diabetes mellitus with other specified complication: Secondary | ICD-10-CM | POA: Diagnosis present

## 2024-04-23 DIAGNOSIS — Z9841 Cataract extraction status, right eye: Secondary | ICD-10-CM

## 2024-04-23 DIAGNOSIS — F32A Depression, unspecified: Secondary | ICD-10-CM | POA: Diagnosis present

## 2024-04-23 DIAGNOSIS — Z87891 Personal history of nicotine dependence: Secondary | ICD-10-CM

## 2024-04-23 DIAGNOSIS — Z961 Presence of intraocular lens: Secondary | ICD-10-CM | POA: Diagnosis present

## 2024-04-23 DIAGNOSIS — N189 Chronic kidney disease, unspecified: Secondary | ICD-10-CM

## 2024-04-23 DIAGNOSIS — K219 Gastro-esophageal reflux disease without esophagitis: Secondary | ICD-10-CM | POA: Diagnosis present

## 2024-04-23 DIAGNOSIS — Z8249 Family history of ischemic heart disease and other diseases of the circulatory system: Secondary | ICD-10-CM

## 2024-04-23 DIAGNOSIS — Z7982 Long term (current) use of aspirin: Secondary | ICD-10-CM

## 2024-04-23 DIAGNOSIS — I120 Hypertensive chronic kidney disease with stage 5 chronic kidney disease or end stage renal disease: Secondary | ICD-10-CM | POA: Diagnosis present

## 2024-04-23 LAB — CBC
HCT: 27.9 % — ABNORMAL LOW (ref 36.0–46.0)
Hemoglobin: 9.2 g/dL — ABNORMAL LOW (ref 12.0–15.0)
MCH: 31.6 pg (ref 26.0–34.0)
MCHC: 33 g/dL (ref 30.0–36.0)
MCV: 95.9 fL (ref 80.0–100.0)
Platelets: 182 K/uL (ref 150–400)
RBC: 2.91 MIL/uL — ABNORMAL LOW (ref 3.87–5.11)
RDW: 12.3 % (ref 11.5–15.5)
WBC: 4.8 K/uL (ref 4.0–10.5)
nRBC: 0 % (ref 0.0–0.2)

## 2024-04-23 LAB — URINALYSIS, ROUTINE W REFLEX MICROSCOPIC
Bacteria, UA: NONE SEEN
Bilirubin Urine: NEGATIVE
Glucose, UA: >=500 mg/dL — AB
Ketones, ur: NEGATIVE mg/dL
Nitrite: NEGATIVE
Protein, ur: >=300 mg/dL — AB
Specific Gravity, Urine: 1.007 (ref 1.005–1.030)
pH: 7 (ref 5.0–8.0)

## 2024-04-23 LAB — I-STAT CHEM 8, ED
BUN: 55 mg/dL — ABNORMAL HIGH (ref 8–23)
Calcium, Ion: 1.07 mmol/L — ABNORMAL LOW (ref 1.15–1.40)
Chloride: 105 mmol/L (ref 98–111)
Creatinine, Ser: 6.9 mg/dL — ABNORMAL HIGH (ref 0.44–1.00)
Glucose, Bld: 221 mg/dL — ABNORMAL HIGH (ref 70–99)
HCT: 28 % — ABNORMAL LOW (ref 36.0–46.0)
Hemoglobin: 9.5 g/dL — ABNORMAL LOW (ref 12.0–15.0)
Potassium: 4.6 mmol/L (ref 3.5–5.1)
Sodium: 139 mmol/L (ref 135–145)
TCO2: 22 mmol/L (ref 22–32)

## 2024-04-23 LAB — BASIC METABOLIC PANEL WITH GFR
Anion gap: 16 — ABNORMAL HIGH (ref 5–15)
BUN: 58 mg/dL — ABNORMAL HIGH (ref 8–23)
CO2: 20 mmol/L — ABNORMAL LOW (ref 22–32)
Calcium: 9.1 mg/dL (ref 8.9–10.3)
Chloride: 104 mmol/L (ref 98–111)
Creatinine, Ser: 6.84 mg/dL — ABNORMAL HIGH (ref 0.44–1.00)
GFR, Estimated: 6 mL/min — ABNORMAL LOW (ref >60)
Glucose, Bld: 221 mg/dL — ABNORMAL HIGH (ref 70–99)
Potassium: 4.7 mmol/L (ref 3.5–5.1)
Sodium: 140 mmol/L (ref 135–145)

## 2024-04-23 NOTE — ED Triage Notes (Signed)
 Pt in from home via [facility] with R flank pain, hx of CKD. Per EMS, pt's doctor called her this afternoon and said her Crt was elevated at 7, recommended IVF's at the hospital. Pt states flank pain worsened x 1 wk. A&ox4, no recent falls/injuries. Normal I&o's at home.  VS w/EMS: 199/84 66HR 16RR 99%RA CBG 260

## 2024-04-23 NOTE — Telephone Encounter (Signed)
 Called patient to discuss worsening Cr. She continues to be A&O x4. No uremic symptoms. She reports normal urine output.  She is wanting to avoid hospital admission. We discussed repeating her Cr next week, if it has not improved she will agree to present to the hospital for IVF and management of renal failure. Future BMP order placed. Patient will cal to schedule a lab appointment once she has transportation.   She will call her nephrologist office today to get an earlier appointment. She follows with an office in [facility].   Will forward conversation to inpatient upper level resident as she is a potential admission next week.   Damien Pinal, DO [facility], PGY-3 04/23/24 12:55 PM

## 2024-04-23 NOTE — ED Provider Triage Note (Signed)
 Emergency Medicine Provider Triage Evaluation Note  Kayla Gregory , a 63 y.o. female  was evaluated in triage.  Pt complains of abnormal outpatient lab, found to have elevated creatinine beyond baseline at outpatient visit.  This was in the setting of 2 weeks of right flank pain.  Review of Systems  Positive: Right flank pain Negative: No dysuria, urgency, frequency, hematuria  Physical Exam  BP (!) 184/72 (BP Location: Left Arm)   Pulse 66   Temp 98.2 F (36.8 C) (Oral)   Resp 16   Wt 63.8 kg   SpO2 99%   BMI 26.57 kg/m  Gen:   Awake, no distress   Resp:  Normal effort  MSK:   Moves extremities without difficulty  Other:  Mild right CVA tenderness, no left CVA tenderness, no suprapubic tenderness  Medical Decision Making  Medically screening exam initiated at 10:17 PM.  Appropriate orders placed.  Kayla Gregory was informed that the remainder of the evaluation will be completed by another provider, this initial triage assessment does not replace that evaluation, and the importance of remaining in the ED until their evaluation is complete.     Kayla Jerilynn RAMAN, MD 04/23/24 2218

## 2024-04-23 NOTE — Telephone Encounter (Signed)
 Patient Kayla Gregory on nurse line yesterday afternoon regarding lab results from visit on 04/21/24. Patient has specific concerns regarding her creatinine results.   Please return call to patient at [phone].  Chiquita JAYSON English, RN

## 2024-04-23 NOTE — Telephone Encounter (Signed)
 Patient calls nurse line in regards IV fluids.   She reports she is calling the ambulance to proceed with IV fluids.   She reports she feels well however her side is hurting her. She reports she does not want anything to progress over the weekend.   Advised will forward to PCP to make aware.

## 2024-04-24 ENCOUNTER — Emergency Department (HOSPITAL_COMMUNITY)

## 2024-04-24 DIAGNOSIS — Z87891 Personal history of nicotine dependence: Secondary | ICD-10-CM | POA: Diagnosis not present

## 2024-04-24 DIAGNOSIS — Z888 Allergy status to other drugs, medicaments and biological substances status: Secondary | ICD-10-CM | POA: Diagnosis not present

## 2024-04-24 DIAGNOSIS — M4804 Spinal stenosis, thoracic region: Secondary | ICD-10-CM | POA: Diagnosis present

## 2024-04-24 DIAGNOSIS — E1169 Type 2 diabetes mellitus with other specified complication: Secondary | ICD-10-CM | POA: Diagnosis present

## 2024-04-24 DIAGNOSIS — Z96642 Presence of left artificial hip joint: Secondary | ICD-10-CM | POA: Diagnosis present

## 2024-04-24 DIAGNOSIS — M4644 Discitis, unspecified, thoracic region: Principal | ICD-10-CM

## 2024-04-24 DIAGNOSIS — N186 End stage renal disease: Secondary | ICD-10-CM | POA: Diagnosis present

## 2024-04-24 DIAGNOSIS — Z789 Other specified health status: Secondary | ICD-10-CM

## 2024-04-24 DIAGNOSIS — M4634 Infection of intervertebral disc (pyogenic), thoracic region: Secondary | ICD-10-CM | POA: Diagnosis present

## 2024-04-24 DIAGNOSIS — N185 Chronic kidney disease, stage 5: Secondary | ICD-10-CM | POA: Diagnosis not present

## 2024-04-24 DIAGNOSIS — F32A Depression, unspecified: Secondary | ICD-10-CM | POA: Diagnosis present

## 2024-04-24 DIAGNOSIS — E1122 Type 2 diabetes mellitus with diabetic chronic kidney disease: Secondary | ICD-10-CM | POA: Diagnosis present

## 2024-04-24 DIAGNOSIS — Z79899 Other long term (current) drug therapy: Secondary | ICD-10-CM | POA: Diagnosis not present

## 2024-04-24 DIAGNOSIS — N179 Acute kidney failure, unspecified: Secondary | ICD-10-CM | POA: Diagnosis present

## 2024-04-24 DIAGNOSIS — I120 Hypertensive chronic kidney disease with stage 5 chronic kidney disease or end stage renal disease: Secondary | ICD-10-CM | POA: Diagnosis present

## 2024-04-24 DIAGNOSIS — Z5941 Food insecurity: Secondary | ICD-10-CM | POA: Diagnosis not present

## 2024-04-24 DIAGNOSIS — E785 Hyperlipidemia, unspecified: Secondary | ICD-10-CM | POA: Diagnosis present

## 2024-04-24 DIAGNOSIS — Z66 Do not resuscitate: Secondary | ICD-10-CM | POA: Diagnosis present

## 2024-04-24 DIAGNOSIS — Z8249 Family history of ischemic heart disease and other diseases of the circulatory system: Secondary | ICD-10-CM | POA: Diagnosis not present

## 2024-04-24 DIAGNOSIS — D631 Anemia in chronic kidney disease: Secondary | ICD-10-CM | POA: Diagnosis present

## 2024-04-24 DIAGNOSIS — M4624 Osteomyelitis of vertebra, thoracic region: Secondary | ICD-10-CM | POA: Diagnosis present

## 2024-04-24 DIAGNOSIS — K219 Gastro-esophageal reflux disease without esophagitis: Secondary | ICD-10-CM | POA: Diagnosis present

## 2024-04-24 DIAGNOSIS — M4854XA Collapsed vertebra, not elsewhere classified, thoracic region, initial encounter for fracture: Secondary | ICD-10-CM | POA: Diagnosis present

## 2024-04-24 DIAGNOSIS — F209 Schizophrenia, unspecified: Secondary | ICD-10-CM | POA: Diagnosis present

## 2024-04-24 DIAGNOSIS — B9689 Other specified bacterial agents as the cause of diseases classified elsewhere: Secondary | ICD-10-CM | POA: Diagnosis present

## 2024-04-24 DIAGNOSIS — Z7982 Long term (current) use of aspirin: Secondary | ICD-10-CM | POA: Diagnosis not present

## 2024-04-24 DIAGNOSIS — Z791 Long term (current) use of non-steroidal anti-inflammatories (NSAID): Secondary | ICD-10-CM | POA: Diagnosis not present

## 2024-04-24 LAB — SEDIMENTATION RATE: Sed Rate: 56 mm/h — ABNORMAL HIGH (ref 0–22)

## 2024-04-24 LAB — GLUCOSE, CAPILLARY
Glucose-Capillary: 206 mg/dL — ABNORMAL HIGH (ref 70–99)
Glucose-Capillary: 155 mg/dL — ABNORMAL HIGH (ref 70–99)
Glucose-Capillary: 241 mg/dL — ABNORMAL HIGH (ref 70–99)

## 2024-04-24 LAB — SURGICAL PCR SCREEN
MRSA, PCR: NEGATIVE
Staphylococcus aureus: NEGATIVE

## 2024-04-24 LAB — C-REACTIVE PROTEIN: CRP: <0.5 mg/dL (ref <1.0)

## 2024-04-24 MED ORDER — ACETAMINOPHEN 325 MG PO TABS: 650.0000 mg | ORAL_TABLET | Freq: Four times a day (QID) | ORAL | Status: DC | PRN

## 2024-04-24 MED ORDER — SODIUM CHLORIDE 0.9 % IV BOLUS
500.0000 mL | Freq: Once | INTRAVENOUS | Status: AC
  Administered 2024-04-24: 500 mL via INTRAVENOUS

## 2024-04-24 MED ORDER — SODIUM CHLORIDE 0.9 % IV SOLN: INTRAVENOUS | Status: DC

## 2024-04-24 MED ORDER — ATORVASTATIN CALCIUM 40 MG PO TABS
40.0000 mg | ORAL_TABLET | Freq: Every day | ORAL | Status: DC
  Administered 2024-04-24 – 2024-04-29 (×6): 40 mg via ORAL
  Filled 2024-04-24 (×7): qty 1

## 2024-04-24 MED ORDER — SODIUM CHLORIDE 0.9 % IV BOLUS
1500.0000 mL | Freq: Once | INTRAVENOUS | Status: AC
  Administered 2024-04-24: 1500 mL via INTRAVENOUS

## 2024-04-24 MED ORDER — MUPIROCIN 2 % EX OINT: 1.0000 | TOPICAL_OINTMENT | Freq: Two times a day (BID) | CUTANEOUS | Status: DC

## 2024-04-24 MED ORDER — FLUOXETINE HCL 10 MG PO CAPS
10.0000 mg | ORAL_CAPSULE | Freq: Every day | ORAL | Status: DC
  Administered 2024-04-26: 10 mg via ORAL
  Filled 2024-04-24 (×6): qty 1

## 2024-04-24 MED ORDER — HEPARIN SODIUM (PORCINE) 5000 UNIT/ML IJ SOLN
5000.0000 [IU] | Freq: Three times a day (TID) | INTRAMUSCULAR | Status: DC
  Administered 2024-04-24 – 2024-04-29 (×15): 5000 [IU] via SUBCUTANEOUS
  Filled 2024-04-24 (×15): qty 1

## 2024-04-24 MED ORDER — NALOXONE HCL 0.4 MG/ML IJ SOLN
0.4000 mg | INTRAMUSCULAR | Status: DC | PRN
  Administered 2024-04-24: 0.4 mg via INTRAVENOUS
  Filled 2024-04-24: qty 1

## 2024-04-24 MED ORDER — MORPHINE SULFATE (PF) 4 MG/ML IV SOLN
4.0000 mg | Freq: Once | INTRAVENOUS | Status: AC
  Administered 2024-04-24: 4 mg via INTRAVENOUS
  Filled 2024-04-24: qty 1

## 2024-04-24 MED ORDER — HYDROMORPHONE HCL 1 MG/ML IJ SOLN
1.0000 mg | Freq: Once | INTRAMUSCULAR | Status: AC
  Administered 2024-04-24: 1 mg via INTRAVENOUS
  Filled 2024-04-24: qty 1

## 2024-04-24 MED ORDER — NALOXONE HCL 0.4 MG/ML IJ SOLN
INTRAMUSCULAR | Status: AC
  Administered 2024-04-24: 0.4 mg via INTRAVENOUS
  Filled 2024-04-24: qty 1

## 2024-04-24 MED ORDER — BENZTROPINE MESYLATE 1 MG PO TABS
1.0000 mg | ORAL_TABLET | Freq: Every day | ORAL | Status: DC
  Administered 2024-04-24 – 2024-04-26 (×2): 1 mg via ORAL
  Filled 2024-04-24 (×6): qty 1

## 2024-04-24 MED ORDER — INSULIN ASPART 100 UNIT/ML IJ SOLN
0.0000 [IU] | Freq: Three times a day (TID) | INTRAMUSCULAR | Status: DC
  Administered 2024-04-24: 1 [IU] via SUBCUTANEOUS
  Administered 2024-04-24 – 2024-04-26 (×3): 2 [IU] via SUBCUTANEOUS
  Administered 2024-04-26: 1 [IU] via SUBCUTANEOUS
  Administered 2024-04-27: 2 [IU] via SUBCUTANEOUS
  Administered 2024-04-27 – 2024-04-28 (×3): 1 [IU] via SUBCUTANEOUS
  Administered 2024-04-28: 2 [IU] via SUBCUTANEOUS
  Administered 2024-04-29: 1 [IU] via SUBCUTANEOUS

## 2024-04-24 MED ORDER — ZIPRASIDONE HCL 20 MG PO CAPS: 20.0000 mg | ORAL_CAPSULE | Freq: Every day | ORAL | Status: DC

## 2024-04-24 MED ORDER — IRBESARTAN 300 MG PO TABS: 150.0000 mg | ORAL_TABLET | Freq: Every day | ORAL | Status: DC

## 2024-04-24 MED ORDER — ONDANSETRON HCL 4 MG/2ML IJ SOLN
4.0000 mg | Freq: Once | INTRAMUSCULAR | Status: AC
  Administered 2024-04-24: 4 mg via INTRAVENOUS
  Filled 2024-04-24: qty 2

## 2024-04-24 MED ORDER — ACETAMINOPHEN 650 MG RE SUPP
650.0000 mg | Freq: Four times a day (QID) | RECTAL | Status: DC | PRN
Start: 2024-04-24 — End: 2024-04-25

## 2024-04-24 NOTE — ED Notes (Signed)
 Patients respiratory rate 7-8 and patient was very drowsy. PRN given. See MAR.

## 2024-04-24 NOTE — ED Notes (Addendum)
 Per 3W no hospital bed in room

## 2024-04-24 NOTE — ED Notes (Signed)
 Pt's oxygen noted to drop into 80s while sleeping.  Pt placed on 3L [place].

## 2024-04-24 NOTE — Consult Note (Signed)
 Reason for Consult: Discitis osteomyelitis T11-T12 Referring Physician: Dr. Raford Chrissie Kayla Gregory is an 63 y.o. female.  HPI: The patient is a 63 year old individual who has several medical problems but has developed significant back and flank pain.  An MRI was performed which demonstrates presence of an erosive lesion at the T11-T12 disc space with some kyphosis.  This is highly suspicious for discitis and osteomyelitis.  The patient notes that she has had some back pain and some radiation down to her flank and into her lower extremity but she has not had any weakness in her legs.  Past Medical History:  Diagnosis Date   Chronic kidney disease    GERD (gastroesophageal reflux disease)    Hyperlipidemia associated with type 2 diabetes mellitus (HCC)    Hypertension    Liver disease    Schizophrenia [facility])     Past Surgical History:  Procedure Laterality Date   BUNIONECTOMY Left 12/12/2022   Procedure: KELLER BUNION IMPLANT;  Surgeon: Janit Thresa HERO, DPM;  Location: [facility];  Service: Podiatry;  Laterality: Left;   CAPSULOTOMY METATARSOPHALANGEAL Left 12/12/2022   Procedure: CAPSULOTOMY METATARSOPHALANGEAL SECOND;  Surgeon: Janit Thresa HERO, DPM;  Location: [facility];  Service: Podiatry;  Laterality: Left;   CATARACT EXTRACTION W/ INTRAOCULAR LENS  IMPLANT, BILATERAL Bilateral    approx 2020   COLONOSCOPY  06/2020   ECTOPIC PREGNANCY SURGERY     1980s   FOOT SURGERY Bilateral 03/05/2017   @ [facility] by dr b. janit;   right 1st bunionectomy/  right 2nd hammertoe correction;  left and right 5th hammer toe correction   HAMMER TOE SURGERY Left 12/12/2022   Procedure: HAMMER TOE CORRECTION SECOND;  Surgeon: Janit Thresa HERO, DPM;  Location: [facility];  Service: Podiatry;  Laterality: Left;   PTOSIS REPAIR Left 12/25/2017   Procedure: INTERNAL PTOSIS REPAIR LEFT EYE;  Surgeon: Laurie Loyd Redhead, MD;  Location: [facility];  Service: Ophthalmology;   Laterality: Left;   TONSILLECTOMY     age 44   TOTAL HIP ARTHROPLASTY Left 2011    Family History  Problem Relation Age of Onset   Heart attack Sister 68   Heart attack Brother 76   Heart attack Mother 8    Social History:  reports that she quit smoking about 24 years ago. Her smoking use included cigarettes. She started smoking about 51 years ago. She has been exposed to tobacco smoke. She has never used smokeless tobacco. She reports that she does not currently use drugs. She reports that she does not drink alcohol.  Allergies:  Allergies  Allergen Reactions   Metformin  And Related Diarrhea and Other (See Comments)    GI symptoms including abdominal pain and diarrhea - Pt not interested in taking again   Norvasc  [Amlodipine ] Itching   Omeprazole  Rash, Dermatitis and Other (See Comments)    Denies airway involvement    Medications: I have not reviewed the patient's medication  Results for orders placed or performed during the hospital encounter of 04/23/24 (from the past 48 hours)  Urinalysis, Routine w reflex microscopic -Urine, Clean Catch     Status: Abnormal   Collection Time: 04/23/24  9:33 PM  Result Value Ref Range   Color, Urine STRAW (A) YELLOW   APPearance CLEAR CLEAR   Specific Gravity, Urine 1.007 1.005 - 1.030   pH 7.0 5.0 - 8.0   Glucose, UA >=500 (A) NEGATIVE mg/dL   Hgb urine dipstick SMALL (A) NEGATIVE  Bilirubin Urine NEGATIVE NEGATIVE   Ketones, ur NEGATIVE NEGATIVE mg/dL   Protein, ur >=699 (A) NEGATIVE mg/dL   Nitrite NEGATIVE NEGATIVE   Leukocytes,Ua MODERATE (A) NEGATIVE   RBC / HPF 0-5 0 - 5 RBC/hpf   WBC, UA 6-10 0 - 5 WBC/hpf   Bacteria, UA NONE SEEN NONE SEEN   Squamous Epithelial / HPF 0-5 0 - 5 /HPF    Comment: Performed at [facility], [address] [place], [place] [address]  Basic metabolic panel     Status: Abnormal   Collection Time: 04/23/24  9:41 PM  Result Value Ref Range   Sodium 140 135 - 145 mmol/L   Potassium 4.7  3.5 - 5.1 mmol/L   Chloride 104 98 - 111 mmol/L   CO2 20 (L) 22 - 32 mmol/L   Glucose, Bld 221 (H) 70 - 99 mg/dL    Comment: Glucose reference range applies only to samples taken after fasting for at least 8 hours.   BUN 58 (H) 8 - 23 mg/dL   Creatinine, Ser 3.15 (H) 0.44 - 1.00 mg/dL   Calcium  9.1 8.9 - 10.3 mg/dL   GFR, Estimated 6 (L) >60 mL/min    Comment: (NOTE) Calculated using the CKD-EPI Creatinine Equation (2021)    Anion gap 16 (H) 5 - 15    Comment: Performed at [facility], [address] [place], [place] [address]  CBC     Status: Abnormal   Collection Time: 04/23/24  9:41 PM  Result Value Ref Range   WBC 4.8 4.0 - 10.5 K/uL   RBC 2.91 (L) 3.87 - 5.11 MIL/uL   Hemoglobin 9.2 (L) 12.0 - 15.0 g/dL   HCT 72.0 (L) 63.9 - 53.9 %   MCV 95.9 80.0 - 100.0 fL   MCH 31.6 26.0 - 34.0 pg   MCHC 33.0 30.0 - 36.0 g/dL   RDW 87.6 88.4 - 84.4 %   Platelets 182 150 - 400 K/uL   nRBC 0.0 0.0 - 0.2 %    Comment: Performed at [facility], [address] [place], [place] [address]  I-stat chem 8, ED (not at [facility], [facility] or [facility])     Status: Abnormal   Collection Time: 04/23/24  9:52 PM  Result Value Ref Range   Sodium 139 135 - 145 mmol/L   Potassium 4.6 3.5 - 5.1 mmol/L   Chloride 105 98 - 111 mmol/L   BUN 55 (H) 8 - 23 mg/dL   Creatinine, Ser 3.09 (H) 0.44 - 1.00 mg/dL   Glucose, Bld 778 (H) 70 - 99 mg/dL    Comment: Glucose reference range applies only to samples taken after fasting for at least 8 hours.   Calcium , Ion 1.07 (L) 1.15 - 1.40 mmol/L   TCO2 22 22 - 32 mmol/L   Hemoglobin 9.5 (L) 12.0 - 15.0 g/dL   HCT 71.9 (L) 63.9 - 53.9 %  Sedimentation rate     Status: Abnormal   Collection Time: 04/24/24  4:07 AM  Result Value Ref Range   Sed Rate 56 (H) 0 - 22 mm/hr    Comment: Performed at [facility], [address], [place], [place] [address]  C-reactive protein     Status: None   Collection Time: 04/24/24  4:07 AM  Result Value Ref Range   CRP <0.5  <1.0 mg/dL    Comment: Performed at [facility], [address], [place], [place] [address]  Glucose, capillary     Status: Abnormal   Collection Time: 04/24/24 12:20 PM  Result Value Ref Range   Glucose-Capillary 206 (H) 70 - 99 mg/dL    Comment: Glucose reference range applies only to samples taken after fasting for at least 8 hours.    MR THORACIC SPINE WO CONTRAST Result Date: 04/24/2024 CLINICAL DATA:  Mid-back pain, infection suspected, positive xray/CT. EXAM: MRI THORACIC SPINE WITHOUT CONTRAST TECHNIQUE: Multiplanar, multisequence MR imaging of the thoracic spine was performed. No intravenous contrast was administered. COMPARISON:  CT renal stone protocol from 04/23/2024. FINDINGS: Alignment: At T11-T12, focal kyphosis and approximately 2 mm of anterolisthesis of T11 on T12. Otherwise, no substantial sagittal subluxation. Vertebrae: T11-T12 endplate erosive changes with surrounding adjacent marrow edema. Edema extends into the right T11 and T12 posterior elements. No appreciable disc edema; however, the disc is essentially collapsed. See bony retropulsion results in moderate to severe canal stenosis at this level. Cord:  Normal cord signal. Paraspinal and other soft tissues: Mild paraspinal edema at T11-T12. No drainable fluid collection identified. Disc levels: At T11-T12, moderate to severe canal stenosis as described above. No high-grade stenosis at the other levels. No visible epidural fluid collection. Postcontrast imaging could provide more sensitive evaluation. IMPRESSION: 1. At T11-T12 there is collapse of the disc space with endplate erosive changes, focal kyphosis and T11-T12 marrow edema. Findings could be secondary to chronic discitis/osteomyelitis or severe inflammatory degenerative changes. 2. Associated bony retropulsion results in moderate to severe canal stenosis at T11-T12. 3. Mild surrounding T11-T12 paraspinal edema. No visible drainable paraspinal or epidural fluid  collection. Postcontrast imaging could provide more sensitive evaluation. Electronically Signed   By: Gilmore GORMAN Molt M.D.   On: 04/24/2024 04:04   CT Renal Stone Study Result Date: 04/23/2024 CLINICAL DATA:  Abdominal/flank parenchymal evaluate for kidney stone with urinary obstruction or pyelonephritis. EXAM: CT ABDOMEN AND PELVIS WITHOUT CONTRAST TECHNIQUE: Multidetector CT imaging of the abdomen and pelvis was performed following the standard protocol without IV contrast. RADIATION DOSE REDUCTION: This exam was performed according to the departmental dose-optimization program which includes automated exposure control, adjustment of the mA and/or kV according to patient size and/or use of iterative reconstruction technique. COMPARISON:  CT without contrast 01/12/2024, MRI abdomen without contrast 10/28/2017. FINDINGS: Lower chest: Scattered subpleural scarring change both lung bases. No active process. Calcification in the right coronary artery. The cardiac size is normal.  No pleural or pericardial effusions. Hepatobiliary: No focal liver abnormality is seen without contrast. No gallstones, gallbladder wall thickening, or biliary dilatation. Pancreas: Not well seen due to lack of IV contrast and overlapping abutting stroke. No obvious contour deforming abnormality or ductal dilatation. Spleen: Normal in noncontrast attenuation and size. Adrenals/Urinary Tract: There is no adrenal mass. Again noted 2.5 cm Bosniak 1 cyst in the outer right kidney, Hounsfield density of 7. No follow-up imaging recommended. 1.9 cm Bosniak 1 cyst lower pole right kidney, Hounsfield density is 7. No other contour deforming renal abnormality is seen. There is a short linear calcification medially in the upper pole of the left kidney, which is believed to be vascular. No true caliceal stones are noted either side. There are no ureteral stones or hydronephrosis. The bladder wall obscured posteriorly due to artifact from a left hip  replacement but normal where visible. Stomach/Bowel: Chronic moderate fold thickening proximal to mid stomach. Probable chronic gastritis. No outlet obstructing mass is seen. The unopacified small bowel is normal caliber without inflammatory changes. Appendix is not seen in this  patient. There is moderate fecal stasis. No evidence of colitis or diverticulitis. Vascular/Lymphatic: Moderate aortic and visceral branch vessel atherosclerosis. No AAA. No adenopathy. Reproductive: Uterus and bilateral adnexa are unremarkable. Other: No free fluid, hemorrhage, free air or hernia is seen. Subcutaneous stranding consistent with prior injections again noted to the left and right in the mid abdominal wall. Musculoskeletal: There is progressive T10-11 disc collapse with interval increased lytic/cystic excavation of the opposing inferior T10 and superior T11 vertebral bodies. There is increased reactive change in the adjacent paraspinal fat concerning for paraspinal phlegmon. There is no overt epidural space-occupying abscess, but the findings are worrisome for progressive spondylodiscitis and erosive changes in the peridiscal bone. There is progressive loss of the anterior vertebral height at T11 up to 25%. There is a minimal grade 1 anterolisthesis of T10 on T11. A prominent Schmorl's node in the T12 upper plate is unchanged in appearance, but the lytic process atrial lower may now communicate with a T11-12 peridiscal Schmorl's node in the inferior aspect of the T11 body, therefore involvement of the T11-12 disc space is also possibility. MRI without and with contrast is recommended at this time. Chronic L5 pars defects are again noted with grade 1 L5-S1 spondylolisthesis and moderate disc space loss. No other significant regional osseous findings. IMPRESSION: 1. Progressive T10-11 disc collapse with interval increased lytic/cystic excavation of the opposing inferior T10 and superior T11 vertebral bodies, with increased  reactive change in the adjacent paraspinal fat concerning for paraspinal phlegmon. Findings are worrisome for progressive spondylodiscitis and erosive changes in the peridiscal bone. MRI without and with contrast is recommended at this time. 2. The T11 lytic process now may also communicate with a peridiscal Schmorl's node in the inferior aspect of T11, therefore involvement of the T11-12 disc is not excluded. 3. No urolithiasis or hydronephrosis currently or previously. 4. Constipation and diverticulosis. 5. Aortic and coronary artery atherosclerosis. 6. Chronic moderate fold thickening in the proximal to mid stomach, probably chronic gastritis. 7. 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 Aortic Atherosclerosis (ICD10-I70.0). Electronically Signed   By: Francis Quam M.D.   On: 04/23/2024 23:08    Review of Systems  Constitutional:  Positive for activity change.  Musculoskeletal:  Positive for back pain and gait problem.   Blood pressure (!) 160/78, pulse 64, temperature 97.8 F (36.6 C), temperature source Oral, resp. rate 20, height 5' 1 (1.549 m), weight 63.5 kg, SpO2 95%. Physical Exam Constitutional:      Appearance: Normal appearance.  HENT:     Head: Normocephalic and atraumatic.     Right Ear: Tympanic membrane, ear canal and external ear normal.     Left Ear: Tympanic membrane, ear canal and external ear normal.     Nose: Nose normal.     Mouth/Throat:     Mouth: Mucous membranes are moist.     Pharynx: Oropharynx is clear.  Eyes:     Extraocular Movements: Extraocular movements intact.     Conjunctiva/sclera: Conjunctivae normal.     Pupils: Pupils are equal, round, and reactive to light.  Cardiovascular:     Rate and Rhythm: Normal rate and regular rhythm.     Pulses: Normal pulses.     Heart sounds: Normal heart sounds.  Pulmonary:     Effort: Pulmonary effort is normal.     Breath sounds: Normal breath sounds.  Abdominal:     General: Abdomen is  flat.     Palpations: Abdomen is soft.  Musculoskeletal:  Cervical back: Normal range of motion and neck supple.     Comments: Tenderness to palpation and percussion of the back at the thoracolumbar junction.  Skin:    General: Skin is warm and dry.     Capillary Refill: Capillary refill takes less than 2 seconds.  Neurological:     General: No focal deficit present.     Mental Status: She is alert.  Psychiatric:        Mood and Affect: Mood normal.        Behavior: Behavior normal.        Thought Content: Thought content normal.        Judgment: Judgment normal.     Assessment/Plan: Discitis osteomyelitis T11-T12.  I would advise needle aspiration of the disc space at T11-T12 by interventional radiology to obtain a diagnosis then appropriate antibiotic treatment.  No surgical intervention warranted at this time.  Kayla Gregory 04/24/2024, 2:08 PM

## 2024-04-24 NOTE — Assessment & Plan Note (Addendum)
 HTN - hold valsartan -hydrochlorothiazide  due to elevated Cr T2DM - very sensitive SSI, will restart home Semglee  10 units prn HLD - continue lipitor Schizophrenia - Holding ziprasidone  while patient is NPO, will restart tmrw AM when pt is no longer NPO. Continue benztropine . GERD - continue protonix  Depression - continue fluoxetine 

## 2024-04-24 NOTE — Plan of Care (Signed)

## 2024-04-24 NOTE — H&P (Addendum)
 Hospital Admission History and Physical Service Pager: [phone]  Patient name: Kayla Gregory Medical record number: [id number] Date of Birth: [birth year] Age: 63 y.o. Gender: female  Primary Care Provider: Damien Pinal, DO Consultants: Neurosurgery, Nephrology Code Status: DNR/DNI Preferred Emergency Contact:  Contact Information     Name Relation Home Work [place] Sister [phone]  [phone]   KATHYRN RAEANN [place]   [phone]   Chief Complaint: R flank pain  Differential and Medical Decision Making:  Kayla Gregory is a 63 y.o. female with past medical history of HTN, T2DM, HLD, CKD presenting with right flank pain and worsening kidney function. Differential for this patient's presentation of this includes chronic discitis/osteomyelitis (more likely based on imaging findings, TTP lateral to the affected area, general weakness, and chills), inflammatory disc degeneration (possible with surrounding edema at affected site), and pyelonephritis (unlikely as pt has remained afebrile, denies UTI sx, unremarkable UA, and and normal WBC).   Assessment & Plan Discitis of thoracic region CT renal stone study and MRI thoracic spine performed in the ED, demonstrated paraspinal phlegmon, progressive disc collapse and moderate to severe canal stenosis at T11-T12. Given patient's concurrent symptoms of chills, fatigue, right flank pain, and imaging findings we are concerned for possible discitis osteomyelitis. Neurosurgery consulted for evaluation. If underlying infectious process is present, we will treat with antibiotics. If primarily inflammatory, we will optimize pain regimen. - Admit to FMTS, attending Dr. Theadore - Med-Surg, Vital signs per floor - NPO diet until neurosurgery evaluation completed - PT/OT to treat - VTE prophylaxis SQ heparin  - Holding antibiotics until neurosurgery evaluation - AM RFP - Fall precautions - Delirium precautions - Neurosurgery  consulted, appreciate recs CKD (chronic kidney disease) stage 5, GFR less than 15 ml/min [facility]) Patient originally diagnosed with renal dysfunction in 2018; currently follows with nephrology at [facility]. Her last GFR was 6 mL/min. Her baseline Cr is 5.0-5.5, and is currently increased to 6.9 today. She is not yet on dialysis, but is being considered for renal transplant. Patient is anemic, which is likely 2/2 decreased RBC production in setting of chronic CKD. We have reached out to nephrology for evaluation given ESRD. - Nephrology consulted, appreciate recs - 500 mL fluid bolus given this AM - Avoid nephrotoxic agents - AM RFP Chronic health problem HTN - hold valsartan -hydrochlorothiazide  due to elevated Cr T2DM - very sensitive SSI, will restart home Semglee  10 units prn HLD - continue lipitor Schizophrenia - Holding ziprasidone  while patient is NPO, will restart tmrw AM when pt is no longer NPO. Continue benztropine . GERD - continue protonix  Depression - continue fluoxetine   FEN/GI: NPO VTE Prophylaxis: subcutaneous heparin   Disposition: Med-Surg  History of Present Illness:  Kayla Gregory is a 63 y.o. female with a history of HTN, T2DM, HLD, CKD presenting with right flank pain and worsening kidney function. Patient reports a two-week history of worsening right flank pain. She was encouraged to come into the ED by her PCP due to increasingly elevated Cr to receive IV fluids. She denies radiating pain. She endorses chills, dizziness, abdominal pain, N/V/D, weakness. Denies fever, chest pain, SOB, dysuria, urgency, frequency, or inability to urinate.  In the ED, she was found to have an elevated Cr of 6.9, and received a 500 mL bolus of IV fluids. She underwent a CT renal stone study, which showed progressive T10-11 disc collapse, followed by an MRI spine which showed a T11-T12 collapse of the disc space, and moderate to  severe canal stenosis at T11-T12 concerning for discitis  or osteomyelitis. Neurosurgery was consulted for evaluation. Patient received morphine  x1 and dilaudid  x2 for pain. She did become apneic with oxygen saturations in the 80s, and received narcan  x 2.  Review Of Systems: Per HPI with the following additions: None  Pertinent Past Medical History: HTN T2DM HLD CKD stage 5  Remainder reviewed in history tab.   Pertinent Past Surgical History: Ectopic pregnancy surgery Total hip arthroplasty  Remainder reviewed in history tab.   Pertinent Social History: Tobacco use: Former smoker, smoked about 41 years; quit smoking about 24 years ago Alcohol use: No Other Substance use: No Lives alone  Pertinent Family History: Mother - MI Brother - MI Sister - MI  Important Outpatient Medications: ASA 81 mg daily Lipitor 40 mg daily Benztropine  1 mg daily Cetirizine  10 mg daily PRN Clonidine  0.1 mg BID Flexeril  10 mg at bedtime Diclofen gel PRN Benadryl  25 mg q4h prn Fluoxetine  10 mg daily Gabapentin  100 mg at bedtime prn Lantus  10 units daily Meloxicam  15 mg daily Protonix  40 mg daily Miralax  17 daily Senna 8.6-50 mg at bedtime prn Trazodone 50 mg at bedtime Valsartan -hydrochlorothiazide  160-25 mg daily Ziprasidone  20 mg daily  Objective: BP (!) 141/63   Pulse 66   Temp 98.2 F (36.8 C) (Oral)   Resp (!) 22   Ht 5' 1 (1.549 m)   Wt 63.5 kg   SpO2 98%   BMI 26.45 kg/m   Exam: General: drowsy, arousable to voice and touch, NAD Eyes: PERRLA, pupils constricted 2/2 opioid pain medication ENTM: MMM Neck: supple Cardiovascular: normal rate; systolic murmur; no rubs/gallops Respiratory: CTAB; slowed respiratory rate Gastrointestinal: normoactive bowel sounds; non-tender, soft, non-distended Genitourinary: right CVA tenderness MSK: no spinal tenderness on palpation, mild R lateral tenderness in lower thoracic spine Derm: warm, dry, no lesions Neuro: alert  Psych: pleasant, affect appropriate, not observed RTIS  Labs:   CBC BMET  Recent Labs  Lab 04/23/24 2141 04/23/24 2152  WBC 4.8  --   HGB 9.2* 9.5*  HCT 27.9* 28.0*  PLT 182  --    Recent Labs  Lab 04/23/24 2141 04/23/24 2152  NA 140 139  K 4.7 4.6  CL 104 105  CO2 20*  --   BUN 58* 55*  CREATININE 6.84* 6.90*  GLUCOSE 221* 221*  CALCIUM  9.1  --      Pertinent additional labs: ESR 56, CRP <0.5  EKG: None  Imaging Studies Performed:  CT Renal Stone Study Radiologist Impression:  1. Progressive T10-11 disc collapse with interval increased lytic/cystic excavation of the opposing inferior T10 and superior T11 vertebral bodies, with increased reactive change in the adjacent paraspinal fat concerning for paraspinal phlegmon. Findings are worrisome for progressive spondylodiscitis and erosive changes in the peridiscal bone. MRI without and with contrast is recommended at this time. 2. The T11 lytic process now Kayla also communicate with a peridiscal Schmorl's node in the inferior aspect of T11, therefore involvement of the T11-12 disc is not excluded. 3. No urolithiasis or hydronephrosis currently or previously. 4. Constipation and diverticulosis. 5. Aortic and coronary artery atherosclerosis. 6. Chronic moderate fold thickening in the proximal to mid stomach, probably chronic gastritis. 7. 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   MRI Thoracic Spine w/o Contrast Radiologist Impression: 1. At T11-T12 there is collapse of the disc space with endplate erosive changes, focal kyphosis and T11-T12 marrow edema. Findings could be secondary to chronic discitis/osteomyelitis or severe inflammatory  degenerative changes. 2. Associated bony retropulsion results in moderate to severe canal stenosis at T11-T12. 3. Mild surrounding T11-T12 paraspinal edema. No visible drainable paraspinal or epidural fluid collection. Postcontrast imaging could provide more sensitive evaluation.   Mannie Ashley SAILOR, MD 04/24/2024,  8:16 AM PGY-1, [facility]  FPTS Intern pager: [phone], text pages welcome Secure chat group [facility] Teaching Service   I have discussed the above with Dr. Mannie and agree with the documented plan. My edits for correction/addition/clarification are included above. Please see any attending notes.   Kathrine Melena, DO PGY-2, [facility] 04/24/2024 2:34 PM

## 2024-04-24 NOTE — Consult Note (Signed)
 Renal Service Consult Note [facility] Lamar JONETTA Fret, MD  Patient: Kayla Gregory Date: 04/24/2024 Requesting Physician: Dr. Donah  Reason for Consult: Renal failure HPI: The patient is a 63 y.o. year-old w/ PMH as below who presented to ED from home yesterday evening complaining of right flank pain and history of CKD 5.  Her doctor had called her to tell her creatinine was up to 7 and recommended going to the hospital.  In the ED blood pressure 199/84, HR 66, RR 16, 98% on room air.  Creatinine was 6.8, BUN 58, K+ 4.7, serum CO2 20.  Calcium  9.1.  Hemoglobin 9.2, WBC 4K.  CT scan shows progressive T10-11 disc collapse with interval increased lytic excavation of the opposing vertebral bodies and also reactive change concerning for paraspinal phlegmon.  Patient was started on IV fluids and given morphine  for her flank and back pain.  Neurosurgery was consulted and recommended aspiration of the disc space by IR.  Patient was admitted to the family medicine teaching service.  We are asked to see for renal failure.   Pt seen in room. Pt is f/b Dr Davida in [place], [place]. She is actively working with [facility] to get on a transplant list.  The patient states her flank pain is much better after she got some medicine. She denies any confusion, jerking of extremities, n/v or loss of appetite or energy.    ROS - denies CP, no joint pain, no HA, no blurry vision, no rash, no diarrhea, no nausea/ vomiting   Past Medical History  Past Medical History:  Diagnosis Date   Chronic kidney disease    GERD (gastroesophageal reflux disease)    Hyperlipidemia associated with type 2 diabetes mellitus (HCC)    Hypertension    Liver disease    Schizophrenia [facility])    Past Surgical History  Past Surgical History:  Procedure Laterality Date   BUNIONECTOMY Left 12/12/2022   Procedure: KELLER BUNION IMPLANT;  Surgeon: Janit Thresa HERO, DPM;  Location: [facility] [place];   Service: Podiatry;  Laterality: Left;   CAPSULOTOMY METATARSOPHALANGEAL Left 12/12/2022   Procedure: CAPSULOTOMY METATARSOPHALANGEAL SECOND;  Surgeon: Janit Thresa HERO, DPM;  Location: [facility];  Service: Podiatry;  Laterality: Left;   CATARACT EXTRACTION W/ INTRAOCULAR LENS  IMPLANT, BILATERAL Bilateral    approx 2020   COLONOSCOPY  06/2020   ECTOPIC PREGNANCY SURGERY     1980s   FOOT SURGERY Bilateral 03/05/2017   @ [facility] by dr b. janit;   right 1st bunionectomy/  right 2nd hammertoe correction;  left and right 5th hammer toe correction   HAMMER TOE SURGERY Left 12/12/2022   Procedure: HAMMER TOE CORRECTION SECOND;  Surgeon: Janit Thresa HERO, DPM;  Location: [facility] [place];  Service: Podiatry;  Laterality: Left;   PTOSIS REPAIR Left 12/25/2017   Procedure: INTERNAL PTOSIS REPAIR LEFT EYE;  Surgeon: Laurie Loyd Redhead, MD;  Location: [facility] OR;  Service: Ophthalmology;  Laterality: Left;   TONSILLECTOMY     age 11   TOTAL HIP ARTHROPLASTY Left 2011   Family History  Family History  Problem Relation Age of Onset   Heart attack Sister 29   Heart attack Brother 65   Heart attack Mother 22   Social History  reports that she quit smoking about 24 years ago. Her smoking use included cigarettes. She started smoking about 51 years ago. She has been exposed to tobacco smoke. She has never used smokeless tobacco.  She reports that she does not currently use drugs. She reports that she does not drink alcohol. Allergies  Allergies  Allergen Reactions   Metformin  And Related Diarrhea and Other (See Comments)    GI symptoms including abdominal pain and diarrhea - Pt not interested in taking again   Norvasc  [Amlodipine ] Itching   Omeprazole  Rash, Dermatitis and Other (See Comments)    Denies airway involvement   Home medications Prior to Admission medications   Medication Sig Start Date End Date Taking? Authorizing Provider  Accu-Chek Softclix Lancets lancets Use as  instructed 11/03/23  Yes Cleotilde Perkins, DO  aspirin  EC 81 MG tablet Take 81 mg by mouth daily. Swallow whole.   Yes [provider]  atorvastatin  (LIPITOR) 40 MG tablet TAKE 1 TABLET(40 MG) BY MOUTH DAILY 03/08/24  Yes Mabe, Elna, MD  benztropine  (COGENTIN ) 1 MG tablet Take by mouth 2 (two) times daily. Per pt take 4 tabs in am and 1 tab at bedtime   Yes [provider]  Blood Glucose Monitoring Suppl (ACCU-CHEK AVIVA PLUS) w/Device KIT 1 application  by Does not apply route 4 (four) times daily -  with meals and at bedtime. 11/03/23  Yes Cleotilde Perkins, DO  Blood Pressure Monitoring (BLOOD PRESSURE CUFF) MISC 1 each by Does not apply route daily. 07/29/22  Yes Cleotilde Perkins, DO  carboxymethylcellulose (REFRESH PLUS) 0.5 % SOLN Place 1 drop into both eyes 3 (three) times daily as needed.   Yes [provider]  cetirizine  (ZYRTEC ) 10 MG tablet Take 1 tablet (10 mg total) by mouth daily as needed for allergies. 05/21/23  Yes Cleotilde Perkins, DO  cloNIDine  (CATAPRES ) 0.1 MG tablet Take 0.1 mg by mouth 2 (two) times daily.   Yes [provider]  cyclobenzaprine  (FLEXERIL ) 10 MG tablet Take 1 tablet (10 mg total) by mouth at bedtime. 03/22/24  Yes Janit Thresa HERO, DPM  diclofenac  Sodium (VOLTAREN  ARTHRITIS PAIN) 1 % GEL Apply 2 g topically 4 (four) times daily. 01/24/24  Yes Johnie, Carley A, NP  diphenhydrAMINE  (BENADRYL ) 25 MG tablet Take 25 mg by mouth every 4 (four) hours as needed.   Yes [provider]  FLUoxetine  (PROZAC ) 10 MG capsule Take 10 mg by mouth daily.   Yes [provider]  gabapentin  (NEURONTIN ) 100 MG capsule TAKE 1 CAPSULE(100 MG) BY MOUTH AT BEDTIME AS NEEDED 04/09/24  Yes Cleotilde Perkins, DO  glucose blood (ACCU-CHEK AVIVA PLUS) test strip Use to check blood glucose up to four times daily 11/03/23  Yes Cleotilde Perkins, DO  insulin  glargine (LANTUS  SOLOSTAR) 100 UNIT/ML Solostar Pen Inject 10 Units into the skin daily. 11/03/23  Yes Cleotilde Perkins, DO  Insulin  Pen Needle (B-D ULTRAFINE III SHORT PEN) 31G X 8 MM MISC USE DAILY AS DIRECTED 11/03/23  Yes Cleotilde Perkins, DO  Lancet Device MISC Check blood glucose once a day in the AM. 04/13/20  Yes Chandra Toribio POUR, MD  Lancet Devices [facility]) lancets Use as instructed 11/22/16  Yes Diallo, Abdoulaye, MD  meloxicam  (MOBIC ) 15 MG tablet TAKE 1 TABLET(15 MG) BY MOUTH DAILY 03/10/24  Yes Janit Thresa HERO, DPM  pantoprazole  (PROTONIX ) 40 MG tablet Take 1 tablet (40 mg total) by mouth daily. 10/01/23  Yes Cleotilde Perkins, DO  polyethylene glycol (MIRALAX ) 17 g packet Take 17 g by mouth daily. 01/12/24  Yes Long, Fonda MATSU, MD  senna-docusate (SENOKOT-S) 8.6-50 MG tablet Take 1 tablet by mouth at bedtime as needed for mild constipation or moderate constipation.  01/12/24  Yes Long, Joshua G, MD  traZODone (DESYREL) 50 MG tablet Take 50 mg by mouth at bedtime.   Yes [provider]  triamcinolone  cream (KENALOG ) 0.1 % Apply 1 Application topically 2 (two) times daily. 04/15/23  Yes Cleotilde Perkins, DO  valsartan -hydrochlorothiazide  (DIOVAN -HCT) 160-25 MG tablet TAKE 1 TABLET BY MOUTH DAILY 04/12/24  Yes Cleotilde Perkins, DO  ziprasidone  (GEODON ) 20 MG capsule Take 20 mg by mouth daily with breakfast.   Yes [provider]  lidocaine  (LIDODERM ) 5 % Place 1 patch onto the skin daily. Remove & Discard patch within 12 hours or as directed by MD Patient not taking: Reported on 04/24/2024 01/24/24   Johnie Flaming A, NP     Vitals:   04/24/24 1015 04/24/24 1100 04/24/24 1159 04/24/24 1536  BP: (!) 187/80 (!) 172/77 (!) 160/78 (!) 177/63  Pulse: (!) 59 60 64 (!) 58  Resp: 10 16 20 18   Temp:   97.8 F (36.6 C) 98 F (36.7 C)  TempSrc:   Oral Oral  SpO2: 100% 100% 95% 96%  Weight:      Height:       Exam Gen alert, no distress Sclera anicteric, throat clear  No jvd or bruits Chest clear bilat to bases RRR no MRG Abd soft ntnd no mass or ascites +bs Ext no LE or UE edema, no other  edema Neuro is alert, Ox 3 , nf   Home bp meds: Clonidine  0.1 bid Valsartan -hydrochlorothiazide  160-25 daily Mobic    Date   Creat  eGFR (ml/min) 2013- 2019  1.02- 1.58 2020   1.58- 1.67 2021   1.96- 2.16 2022   2.99- 3.92 Jan- jun 2023  3.59- 4.31 Jul - dec 2023  4.10- 5.04  Feb- may 2024 4.94- 5.93 7- 9 ml/min Nov 2024  5.50  8 ml/min Jan 12, 2024  5.15  9 ml/min January 21, 2024  5.70  8 ml/min   04/21/24  7.05  6 ml/min   9/05   6.84  6 ml/min, admit date   UA 9/5 - clear, glu > 500, mod LE, prot > 300, 0-5 rbc, 6-10 wbc CT renal stone study , non contrast 9/05: no hydronephrosis bilat kidneys 9/05: Na 140, K+ 4.7, CO2 20, BUN 58, creat 6.8 WBC 4K  Hb 9.5     Assessment/ Plan: AKI on CKD 5: b/l creatinine 5.7 from June 2025, eGFR 8 ml/min. Creatinine here was 7.0 on admit, 6.8 today. Pt is not uremic. She is f/b Dr Davida in [place]. No vol overload or K+ issues. AKI may be vol related. Will hold ARB and hydrochlorothiazide  for now, and give 1.5 L bolus and IVF's at 75 cc/hr overnight. Will follow.  T11-T12 MRI changes: possible discitis/ osteomyelitis. IR planning for aspiration.  HTN: holding ARB, hydrochlorothiazide  as above    Myer Fret  MD [facility] 04/24/2024, 6:11 PM  Recent Labs  Lab 04/23/24 2141 04/23/24 2152  CREATININE 6.84* 6.90*  K 4.7 4.6   Inpatient medications:  atorvastatin   40 mg Oral Daily   benztropine   1 mg Oral Daily   FLUoxetine   10 mg Oral Daily   heparin   5,000 Units Subcutaneous Q8H   insulin  aspart  0-6 Units Subcutaneous TID WC    acetaminophen  **OR** acetaminophen , naLOXone  (NARCAN )  injection

## 2024-04-24 NOTE — Plan of Care (Signed)

## 2024-04-24 NOTE — Assessment & Plan Note (Addendum)
 CT renal stone study and MRI thoracic spine performed in the ED, demonstrated paraspinal phlegmon, progressive disc collapse and moderate to severe canal stenosis at T11-T12. Given patient's concurrent symptoms of chills, fatigue, right flank pain, and imaging findings we are concerned for possible discitis osteomyelitis. Neurosurgery consulted for evaluation. If underlying infectious process is present, we will treat with antibiotics. If primarily inflammatory, we will optimize pain regimen. - Admit to FMTS, attending Dr. Theadore - Med-Surg, Vital signs per floor - NPO diet until neurosurgery evaluation completed - PT/OT to treat - VTE prophylaxis SQ heparin  - Holding antibiotics until neurosurgery evaluation - AM RFP - Fall precautions - Delirium precautions - Neurosurgery consulted, appreciate recs

## 2024-04-24 NOTE — Plan of Care (Addendum)
 Per neurosurgery's recommendations consulted IR regarding needle aspiration.  Spoke with Dr. Jennefer with IR who initially had recommended foregoing needle aspiration and pursuing broad-spectrum antibiotics since imaging revealed minimal aspirate and this could be futile.  Spoke with pharmacy regarding antibiotic regimen and course length and with her worsening kidney function daptomycin and cefepime  empirically would be best for 6 weeks minimum.  Spoke with neurosurgery, Dr. Colon, regarding empiric antibiotic coverage and he believes the best plan would be for IR to perform a needle aspiration at this time since the patient is non-toxic appearing, doesn't appear infectious (afebrile w/ leukocytosis), but has some paraspinal tenderness and possibly something brewing, so having a culture of that area may be be beneficial even if its limited aspirate and confirms an inflammatory process.   Will hold off on antibiotics at this time.  Blood cultures were ordered.  IR agrees with this plan and will perform a needle aspiration tomorrow.  Recommended patient being NPO at 6a on 9/7.  Kathrine Melena, DO 04/24/24 8:33 PM

## 2024-04-24 NOTE — Plan of Care (Addendum)
 FMTS Interim Progress Note  S: Asleep, easily awoken by door opening and voice. Patient reports feeling better.   O: BP (!) 167/63 (BP Location: Right Arm)   Pulse 60   Temp 98 F (36.7 C) (Oral)   Resp 19   Ht 5' 1 (1.549 m)   Wt 63.5 kg   SpO2 91%   BMI 26.45 kg/m    General: Resting comfortably.  CV: S1/S2. No extra heart sounds. Warm and well-perfused. Pulm: Breathing comfortably on room air. CTAB anteriorly. No increased WOB. Skin:  Warm, dry. No extremity swelling.   A/P:  Discitis/Osteomyelitis  -Per NSGY, plan for IR aspiration for culture to guide antibiotic course. Holding abx in the meantime. Blood cultures pending.  -Pain well-controlled at this time. Tyelnol PRN.   -NPO at 6AM for IR aspiration tomorrow, cont IVF in the meantime.   Continue care plan as otherwise indicated in FMTS H&P.  Diona Perkins, MD 04/24/2024, 10:12 PM PGY-2, [facility] Family Medicine Service pager [phone]

## 2024-04-24 NOTE — ED Notes (Signed)
 Patient transported to MRI

## 2024-04-24 NOTE — ED Provider Notes (Signed)
 [place] EMERGENCY DEPARTMENT AT [facility] Provider Note   CSN: [id number] Arrival date & time: 04/23/24  2124     Patient presents with: Flank Pain   Kayla Gregory is a 63 y.o. female.   The history is provided by the patient.  Flank Pain   She has history of hypertension, diabetes, hyperlipidemia, chronic kidney disease and complains of right flank pain which has been present for about 3 months but has been getting worse over the last 2 weeks.  She states there has been no relief of pain with acetaminophen .  There is no radiation of pain.  She has some intermittent numbness of both feet but no pain going down her right leg and no unilateral numbness or weakness.  She also states that her creatinine had gone up and her primary care provider had recommended IV fluids.  She states that she is on a transplant list, has not had surgery to create dialysis access.  Of note, she denies dysuria or urinary urgency or frequency and denies fever or chills.    Prior to Admission medications   Medication Sig Start Date End Date Taking? Authorizing Provider  Accu-Chek Softclix Lancets lancets Use as instructed 11/03/23   Cleotilde Perkins, DO  aspirin  EC 81 MG tablet Take 81 mg by mouth daily. Swallow whole.    [provider]  atorvastatin  (LIPITOR) 40 MG tablet TAKE 1 TABLET(40 MG) BY MOUTH DAILY 03/08/24   Tharon Lung, MD  benztropine  (COGENTIN ) 1 MG tablet Take by mouth 2 (two) times daily. Per pt take 4 tabs in am and 1 tab at bedtime    [provider]  Blood Glucose Monitoring Suppl (ACCU-CHEK AVIVA PLUS) w/Device KIT 1 application  by Does not apply route 4 (four) times daily -  with meals and at bedtime. 11/03/23   Cleotilde Perkins, DO  Blood Pressure Monitoring (BLOOD PRESSURE CUFF) MISC 1 each by Does not apply route daily. 07/29/22   Cleotilde Perkins, DO  carboxymethylcellulose (REFRESH PLUS) 0.5 % SOLN Place 1 drop into both eyes 3 (three) times daily as needed.     [provider]  cetirizine  (ZYRTEC ) 10 MG tablet Take 1 tablet (10 mg total) by mouth daily as needed for allergies. 05/21/23   Cleotilde Perkins, DO  cloNIDine  (CATAPRES ) 0.1 MG tablet Take 0.1 mg by mouth 2 (two) times daily.    [provider]  cyclobenzaprine  (FLEXERIL ) 10 MG tablet Take 1 tablet (10 mg total) by mouth at bedtime. 03/22/24   Janit Thresa HERO, DPM  diclofenac  Sodium (VOLTAREN  ARTHRITIS PAIN) 1 % GEL Apply 2 g topically 4 (four) times daily. 01/24/24   Johnie Flaming A, NP  FLUoxetine  (PROZAC ) 10 MG capsule Take 10 mg by mouth daily.    [provider]  gabapentin  (NEURONTIN ) 100 MG capsule TAKE 1 CAPSULE(100 MG) BY MOUTH AT BEDTIME AS NEEDED 04/09/24   Cleotilde Perkins, DO  glucose blood (ACCU-CHEK AVIVA PLUS) test strip Use to check blood glucose up to four times daily 11/03/23   Cleotilde Perkins, DO  insulin  glargine (LANTUS  SOLOSTAR) 100 UNIT/ML Solostar Pen Inject 10 Units into the skin daily. 11/03/23   Cleotilde Perkins, DO  Insulin  Pen Needle (B-D ULTRAFINE III SHORT PEN) 31G X 8 MM MISC USE DAILY AS DIRECTED 11/03/23   Cleotilde Perkins, DO  Lancet Device MISC Check blood glucose once a day in the AM. 04/13/20   Chandra Toribio POUR, MD  Lancet Devices [facility]) lancets Use as instructed  11/22/16   Diallo, Irving, MD  lidocaine  (LIDODERM ) 5 % Place 1 patch onto the skin daily. Remove & Discard patch within 12 hours or as directed by MD 01/24/24   Johnie Rumaldo LABOR, NP  meloxicam  (MOBIC ) 15 MG tablet TAKE 1 TABLET(15 MG) BY MOUTH DAILY 03/10/24   Evans, Brent M, DPM  pantoprazole  (PROTONIX ) 40 MG tablet Take 1 tablet (40 mg total) by mouth daily. 10/01/23   Cleotilde Perkins, DO  polyethylene glycol (MIRALAX ) 17 g packet Take 17 g by mouth daily. 01/12/24   Long, Fonda MATSU, MD  senna-docusate (SENOKOT-S) 8.6-50 MG tablet Take 1 tablet by mouth at bedtime as needed for mild constipation or moderate constipation. 01/12/24   Long, Joshua G, MD  traZODone (DESYREL) 50 MG tablet  Take 50 mg by mouth at bedtime.    [provider]  triamcinolone  cream (KENALOG ) 0.1 % Apply 1 Application topically 2 (two) times daily. 04/15/23   Cleotilde Perkins, DO  valsartan -hydrochlorothiazide  (DIOVAN -HCT) 160-25 MG tablet TAKE 1 TABLET BY MOUTH DAILY 04/12/24   Cleotilde Perkins, DO  ziprasidone  (GEODON ) 20 MG capsule Take 20 mg by mouth daily with breakfast.    [provider]    Allergies: Metformin  and related, Norvasc  [amlodipine ], and Omeprazole     Review of Systems  Genitourinary:  Positive for flank pain.  All other systems reviewed and are negative.   Updated Vital Signs BP (!) 195/70   Pulse (!) 56   Temp 98.2 F (36.8 C) (Oral)   Resp 13   Ht 5' 1 (1.549 m)   Wt 63.5 kg   SpO2 100%   BMI 26.45 kg/m   Physical Exam Vitals and nursing note reviewed.   63 year old female, resting comfortably and in no acute distress. Vital signs are significant for elevated blood pressure and borderline low heart rate. Oxygen saturation is 100%, which is normal. Head is normocephalic and atraumatic. PERRLA, EOMI. Oropharynx is clear. Neck is nontender and supple without adenopathy or JVD. Back is mildly tender in the lower thoracic spine and into the right paraspinal area and right costovertebral angle area.  Straight leg raise is positive on the right at 60 degrees, negative on the left. Lungs are clear without rales, wheezes, or rhonchi. Chest is nontender. Heart has regular rate and rhythm without murmur. Abdomen is soft, flat, nontender. Extremities have no cyanosis or edema, full range of motion is present. Skin is warm and dry without rash. Neurologic: Mental status is normal, cranial nerves are intact, strength is 5/5 in all 4 extremities, and sensation is normal.  (all labs ordered are listed, but only abnormal results are displayed) Labs Reviewed  URINALYSIS, ROUTINE W REFLEX MICROSCOPIC - Abnormal; Notable for the following components:      Result Value    Color, Urine STRAW (*)    Glucose, UA >=500 (*)    Hgb urine dipstick SMALL (*)    Protein, ur >=300 (*)    Leukocytes,Ua MODERATE (*)    All other components within normal limits  BASIC METABOLIC PANEL WITH GFR - Abnormal; Notable for the following components:   CO2 20 (*)    Glucose, Bld 221 (*)    BUN 58 (*)    Creatinine, Ser 6.84 (*)    GFR, Estimated 6 (*)    Anion gap 16 (*)    All other components within normal limits  CBC - Abnormal; Notable for the following components:   RBC 2.91 (*)    Hemoglobin 9.2 (*)  HCT 27.9 (*)    All other components within normal limits  SEDIMENTATION RATE - Abnormal; Notable for the following components:   Sed Rate 56 (*)    All other components within normal limits  I-STAT CHEM 8, ED - Abnormal; Notable for the following components:   BUN 55 (*)    Creatinine, Ser 6.90 (*)    Glucose, Bld 221 (*)    Calcium , Ion 1.07 (*)    Hemoglobin 9.5 (*)    HCT 28.0 (*)    All other components within normal limits  C-REACTIVE PROTEIN     Radiology: MR THORACIC SPINE WO CONTRAST Result Date: 04/24/2024 CLINICAL DATA:  Mid-back pain, infection suspected, positive xray/CT. EXAM: MRI THORACIC SPINE WITHOUT CONTRAST TECHNIQUE: Multiplanar, multisequence MR imaging of the thoracic spine was performed. No intravenous contrast was administered. COMPARISON:  CT renal stone protocol from 04/23/2024. FINDINGS: Alignment: At T11-T12, focal kyphosis and approximately 2 mm of anterolisthesis of T11 on T12. Otherwise, no substantial sagittal subluxation. Vertebrae: T11-T12 endplate erosive changes with surrounding adjacent marrow edema. Edema extends into the right T11 and T12 posterior elements. No appreciable disc edema; however, the disc is essentially collapsed. See bony retropulsion results in moderate to severe canal stenosis at this level. Cord:  Normal cord signal. Paraspinal and other soft tissues: Mild paraspinal edema at T11-T12. No drainable fluid  collection identified. Disc levels: At T11-T12, moderate to severe canal stenosis as described above. No high-grade stenosis at the other levels. No visible epidural fluid collection. Postcontrast imaging could provide more sensitive evaluation. IMPRESSION: 1. At T11-T12 there is collapse of the disc space with endplate erosive changes, focal kyphosis and T11-T12 marrow edema. Findings could be secondary to chronic discitis/osteomyelitis or severe inflammatory degenerative changes. 2. Associated bony retropulsion results in moderate to severe canal stenosis at T11-T12. 3. Mild surrounding T11-T12 paraspinal edema. No visible drainable paraspinal or epidural fluid collection. Postcontrast imaging could provide more sensitive evaluation. Electronically Signed   By: Gilmore GORMAN Molt M.D.   On: 04/24/2024 04:04   CT Renal Stone Study Result Date: 04/23/2024 CLINICAL DATA:  Abdominal/flank parenchymal evaluate for kidney stone with urinary obstruction or pyelonephritis. EXAM: CT ABDOMEN AND PELVIS WITHOUT CONTRAST TECHNIQUE: Multidetector CT imaging of the abdomen and pelvis was performed following the standard protocol without IV contrast. RADIATION DOSE REDUCTION: This exam was performed according to the departmental dose-optimization program which includes automated exposure control, adjustment of the mA and/or kV according to patient size and/or use of iterative reconstruction technique. COMPARISON:  CT without contrast 01/12/2024, MRI abdomen without contrast 10/28/2017. FINDINGS: Lower chest: Scattered subpleural scarring change both lung bases. No active process. Calcification in the right coronary artery. The cardiac size is normal.  No pleural or pericardial effusions. Hepatobiliary: No focal liver abnormality is seen without contrast. No gallstones, gallbladder wall thickening, or biliary dilatation. Pancreas: Not well seen due to lack of IV contrast and overlapping abutting stroke. No obvious contour  deforming abnormality or ductal dilatation. Spleen: Normal in noncontrast attenuation and size. Adrenals/Urinary Tract: There is no adrenal mass. Again noted 2.5 cm Bosniak 1 cyst in the outer right kidney, Hounsfield density of 7. No follow-up imaging recommended. 1.9 cm Bosniak 1 cyst lower pole right kidney, Hounsfield density is 7. No other contour deforming renal abnormality is seen. There is a short linear calcification medially in the upper pole of the left kidney, which is believed to be vascular. No true caliceal stones are noted either side. There are no ureteral stones  or hydronephrosis. The bladder wall obscured posteriorly due to artifact from a left hip replacement but normal where visible. Stomach/Bowel: Chronic moderate fold thickening proximal to mid stomach. Probable chronic gastritis. No outlet obstructing mass is seen. The unopacified small bowel is normal caliber without inflammatory changes. Appendix is not seen in this patient. There is moderate fecal stasis. No evidence of colitis or diverticulitis. Vascular/Lymphatic: Moderate aortic and visceral branch vessel atherosclerosis. No AAA. No adenopathy. Reproductive: Uterus and bilateral adnexa are unremarkable. Other: No free fluid, hemorrhage, free air or hernia is seen. Subcutaneous stranding consistent with prior injections again noted to the left and right in the mid abdominal wall. Musculoskeletal: There is progressive T10-11 disc collapse with interval increased lytic/cystic excavation of the opposing inferior T10 and superior T11 vertebral bodies. There is increased reactive change in the adjacent paraspinal fat concerning for paraspinal phlegmon. There is no overt epidural space-occupying abscess, but the findings are worrisome for progressive spondylodiscitis and erosive changes in the peridiscal bone. There is progressive loss of the anterior vertebral height at T11 up to 25%. There is a minimal grade 1 anterolisthesis of T10 on T11.  A prominent Schmorl's node in the T12 upper plate is unchanged in appearance, but the lytic process atrial lower may now communicate with a T11-12 peridiscal Schmorl's node in the inferior aspect of the T11 body, therefore involvement of the T11-12 disc space is also possibility. MRI without and with contrast is recommended at this time. Chronic L5 pars defects are again noted with grade 1 L5-S1 spondylolisthesis and moderate disc space loss. No other significant regional osseous findings. IMPRESSION: 1. Progressive T10-11 disc collapse with interval increased lytic/cystic excavation of the opposing inferior T10 and superior T11 vertebral bodies, with increased reactive change in the adjacent paraspinal fat concerning for paraspinal phlegmon. Findings are worrisome for progressive spondylodiscitis and erosive changes in the peridiscal bone. MRI without and with contrast is recommended at this time. 2. The T11 lytic process now may also communicate with a peridiscal Schmorl's node in the inferior aspect of T11, therefore involvement of the T11-12 disc is not excluded. 3. No urolithiasis or hydronephrosis currently or previously. 4. Constipation and diverticulosis. 5. Aortic and coronary artery atherosclerosis. 6. Chronic moderate fold thickening in the proximal to mid stomach, probably chronic gastritis. 7. 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 Aortic Atherosclerosis (ICD10-I70.0). Electronically Signed   By: Francis Quam M.D.   On: 04/23/2024 23:08     Procedures   Medications Ordered in the ED  morphine  (PF) 4 MG/ML injection 4 mg (4 mg Intravenous Given 04/24/24 0213)                                    Medical Decision Making Amount and/or Complexity of Data Reviewed Labs: ordered. Radiology: ordered.  Risk Prescription drug management. Decision regarding hospitalization.   Mid back and right flank pain and patient with chronic kidney disease.  This is a presentation  with a wide range of treatment options and carries with it a high risk of morbidity and complications.  Differential diagnosis includes, but is not limited to, discogenic pain, pyelonephritis, kidney stone, pancreatitis, cholecystitis, appendicitis, diverticulitis.  I have reviewed her laboratory tests, and my interpretation is renal failure with creatinine improved slightly compared with 04/21/2024, anemia with hemoglobin having decreased slightly compared with 01/12/2024 but well within her normal range and anemia likely secondary to end-stage  renal disease, urinalysis showing no signs of infection.  Renal stone protocol CT scan shows progressive T10-11 disc collapse with interval increased lytic/cystic excavation of the opposing inferior T10 and superior T11 vertebral bodies with increased reactive change in the adjacent paraspinal fat concerning for paraspinal phlegmon and worrisome for progressive spondylodiscitis.  I have independently viewed the images, and agree with the radiologist's interpretation.  Radiologist is recommending MRI with and without contrast, however MRI contrast is contraindicated in patient with this degree of renal failure so I have ordered a noncontrast MRI scan.  I have ordered morphine  for pain.  I have also ordered IV fluids.  Sedimentation rate is moderately elevated at 56, but CRP is normal.  MRI shows collapse of the disc space at T11-T12 with endplate erosive changes, focal kyphosis, and T11-T12 marrow edema with associated bony retropulsion resulting in moderate to severe canal stenosis at T11-T12, mild surrounding T11-T12 paraspinal edema.  Findings could be secondary to chronic discitis/osteomyelitis or severe inflammatory degenerative changes.  I have independently viewed all the images, and agree with the radiologist's interpretation.  Pain was poorly controlled with morphine , I have ordered a dose of hydromorphone  which also gave him adequate pain relief.  I have ordered a  second dose of hydromorphone .  At this point, I believe we need to clarify whether the changes seen are infectious or inflammatory and this will likely need aspiration and culture.  I am holding off on antibiotics until this can be done.  Also, she will need to be admitted for pain control as she is requiring large doses of narcotics.  I have discussed the case with Dr. Janna of family practice teaching service, who agrees to admit the patient.  She did require second dose of hydromorphone .  Consult has been placed to neurosurgery to evaluate the patient and MRI scan.     Final diagnoses:  Discitis of thoracic region  End stage renal disease (HCC)  Anemia associated with chronic renal failure  Mid-back pain, acute    ED Discharge Orders     None          Raford Lenis, MD 04/24/24 [phone]

## 2024-04-24 NOTE — Hospital Course (Addendum)
 Kayla Gregory is a 63 y.o.female with a history of HTN, T2DM, HLD, ESRD who was admitted to the [facility] Medicine Teaching Service at [facility] for worsening right flank pain likely secondary to chronic discitis and with worsening kidney function. Her hospital course is detailed below:  Right Flank Pain Ct Renal stone and MRI thoracic demonstrated paraspinal phlegmon, progressive disc collapse, and severe stenosis T11-12. Patient received IV pain medications, initially required Narcan  for apnea, recovered well. NSGY was consulted who recommended IR aspiration. IV cefepime  was started. ID was consulted and cefepime  was switched to ciprofloxacin . Patient was sent home with ciprofloxacin  for a total of 8 weeks. Aspiration culture showed no growth at 4 days. Gram stain showed rare gram negative rods. Blood cultures showed no growth at 5 days.   Other chronic conditions were medically managed with home medications and formulary alternatives as necessary.   PCP Follow-up Recommendations: Ensure completion of 8 weeks of Ciprofloxacin  (9/10 - 11/5) and follow up with MRI thoracic spine w/o contrast afterwards.

## 2024-04-24 NOTE — ED Notes (Signed)
 Patient having apneic episodes and very drowsy. Would arouse only if being engaged with and immediately fall back asleep and respiration rate would return to 4-8 breaths a minute. Admitting MD notified. See MAR.

## 2024-04-24 NOTE — Assessment & Plan Note (Addendum)
 Patient originally diagnosed with renal dysfunction in 2018; currently follows with nephrology at [facility]. Her last GFR was 6 mL/min. Her baseline Cr is 5.0-5.5, and is currently increased to 6.9 today. She is not yet on dialysis, but is being considered for renal transplant. Patient is anemic, which is likely 2/2 decreased RBC production in setting of chronic CKD. We have reached out to nephrology for evaluation given ESRD. - Nephrology consulted, appreciate recs - 500 mL fluid bolus given this AM - Avoid nephrotoxic agents - AM RFP

## 2024-04-25 ENCOUNTER — Inpatient Hospital Stay (HOSPITAL_COMMUNITY)

## 2024-04-25 DIAGNOSIS — M4644 Discitis, unspecified, thoracic region: Secondary | ICD-10-CM | POA: Diagnosis not present

## 2024-04-25 HISTORY — PX: IR THORACIC DISC ASPIRATION W/IMG GUIDE: IMG943

## 2024-04-25 LAB — GLUCOSE, CAPILLARY
Glucose-Capillary: 145 mg/dL — ABNORMAL HIGH (ref 70–99)
Glucose-Capillary: 148 mg/dL — ABNORMAL HIGH (ref 70–99)
Glucose-Capillary: 150 mg/dL — ABNORMAL HIGH (ref 70–99)
Glucose-Capillary: 140 mg/dL — ABNORMAL HIGH (ref 70–99)
Glucose-Capillary: 160 mg/dL — ABNORMAL HIGH (ref 70–99)

## 2024-04-25 LAB — RENAL FUNCTION PANEL
Albumin: 3 g/dL — ABNORMAL LOW (ref 3.5–5.0)
Anion gap: 8 (ref 5–15)
BUN: 55 mg/dL — ABNORMAL HIGH (ref 8–23)
CO2: 24 mmol/L (ref 22–32)
Calcium: 8.9 mg/dL (ref 8.9–10.3)
Chloride: 109 mmol/L (ref 98–111)
Creatinine, Ser: 6.48 mg/dL — ABNORMAL HIGH (ref 0.44–1.00)
GFR, Estimated: 7 mL/min — ABNORMAL LOW (ref >60)
Glucose, Bld: 161 mg/dL — ABNORMAL HIGH (ref 70–99)
Phosphorus: 5.6 mg/dL — ABNORMAL HIGH (ref 2.5–4.6)
Potassium: 5 mmol/L (ref 3.5–5.1)
Sodium: 141 mmol/L (ref 135–145)

## 2024-04-25 LAB — PROTIME-INR
INR: 1 (ref 0.8–1.2)
Prothrombin Time: 14.1 s (ref 11.4–15.2)

## 2024-04-25 MED ORDER — HYDRALAZINE HCL 20 MG/ML IJ SOLN
10.0000 mg | INTRAMUSCULAR | Status: DC | PRN
  Administered 2024-04-27: 10 mg via INTRAVENOUS
  Filled 2024-04-25: qty 1

## 2024-04-25 MED ORDER — CLONIDINE HCL 0.1 MG PO TABS
0.1000 mg | ORAL_TABLET | Freq: Three times a day (TID) | ORAL | Status: DC
  Administered 2024-04-25 – 2024-04-27 (×6): 0.1 mg via ORAL
  Filled 2024-04-25 (×6): qty 1

## 2024-04-25 MED ORDER — MIDAZOLAM HCL 2 MG/2ML IJ SOLN
INTRAMUSCULAR | Status: AC | PRN
  Administered 2024-04-25: 1 mg via INTRAVENOUS

## 2024-04-25 MED ORDER — PANTOPRAZOLE SODIUM 40 MG PO TBEC
40.0000 mg | DELAYED_RELEASE_TABLET | Freq: Every day | ORAL | Status: DC
  Administered 2024-04-25 – 2024-04-29 (×5): 40 mg via ORAL
  Filled 2024-04-25 (×5): qty 1

## 2024-04-25 MED ORDER — ACETAMINOPHEN 500 MG PO TABS
1000.0000 mg | ORAL_TABLET | Freq: Four times a day (QID) | ORAL | Status: DC
  Administered 2024-04-25 – 2024-04-29 (×8): 1000 mg via ORAL
  Filled 2024-04-25 (×10): qty 2

## 2024-04-25 MED ORDER — HYDRALAZINE HCL 10 MG PO TABS
10.0000 mg | ORAL_TABLET | Freq: Three times a day (TID) | ORAL | Status: DC
  Administered 2024-04-25: 10 mg via ORAL
  Filled 2024-04-25: qty 1

## 2024-04-25 MED ORDER — CLONIDINE HCL 0.1 MG PO TABS
0.2000 mg | ORAL_TABLET | Freq: Once | ORAL | Status: AC
  Administered 2024-04-25: 0.2 mg via ORAL
  Filled 2024-04-25: qty 2

## 2024-04-25 MED ORDER — FENTANYL CITRATE (PF) 100 MCG/2ML IJ SOLN
INTRAMUSCULAR | Status: AC | PRN
  Administered 2024-04-25: 25 ug via INTRAVENOUS

## 2024-04-25 MED ORDER — CALCIUM POLYCARBOPHIL 625 MG PO TABS
625.0000 mg | ORAL_TABLET | Freq: Every day | ORAL | Status: DC
  Administered 2024-04-25 – 2024-04-29 (×5): 625 mg via ORAL
  Filled 2024-04-25 (×6): qty 1

## 2024-04-25 MED ORDER — FENTANYL CITRATE (PF) 100 MCG/2ML IJ SOLN
INTRAMUSCULAR | Status: AC
Start: 2024-04-25 — End: 2024-04-25
  Filled 2024-04-25: qty 2

## 2024-04-25 MED ORDER — HYDROMORPHONE HCL 1 MG/ML IJ SOLN: 0.5000 mg | INTRAMUSCULAR | Status: DC | PRN

## 2024-04-25 MED ORDER — HYDROMORPHONE HCL 1 MG/ML IJ SOLN
0.2500 mg | INTRAMUSCULAR | Status: DC | PRN
  Administered 2024-04-26 – 2024-04-27 (×2): 0.25 mg via INTRAVENOUS
  Filled 2024-04-25 (×2): qty 0.5

## 2024-04-25 MED ORDER — SODIUM CHLORIDE 0.9 % IV SOLN
1000.0000 mg | INTRAVENOUS | Status: DC
  Administered 2024-04-25 – 2024-04-27 (×3): 1000 mg via INTRAVENOUS
  Filled 2024-04-25: qty 10
  Filled 2024-04-25: qty 1
  Filled 2024-04-25 (×2): qty 10

## 2024-04-25 MED ORDER — OXYCODONE HCL 5 MG PO TABS
2.5000 mg | ORAL_TABLET | Freq: Three times a day (TID) | ORAL | Status: DC | PRN
  Administered 2024-04-25: 2.5 mg via ORAL
  Filled 2024-04-25: qty 1

## 2024-04-25 MED ORDER — MIDAZOLAM HCL 2 MG/2ML IJ SOLN
INTRAMUSCULAR | Status: AC
  Filled 2024-04-25: qty 2

## 2024-04-25 MED ORDER — OXYCODONE HCL 5 MG PO TABS: 5.0000 mg | ORAL_TABLET | ORAL | Status: DC | PRN

## 2024-04-25 NOTE — Assessment & Plan Note (Addendum)
 Creatinine remains elevated to 6.48 this morning with GFR 7.  Baseline creatinine 5.0-5.5. Patient follows with nephrology at [facility], is being considered for renal transplant.  Nephrology consulted. -Appreciate nephrology recommendations -Avoid nephrotoxic agents -AM RFP

## 2024-04-25 NOTE — Assessment & Plan Note (Addendum)
 HTN - hold home valsartan -hydrochlorothiazide  due to elevated Cr T2DM - very sensitive SSI, may restart home Semglee  10 units as needed HLD - continue home lipitor 40 daily Schizophrenia - Holding home ziprasidone  while patient is NPO, will restart when pt is no longer NPO. Continue benztropine . GERD - continue home protonix  40 daily  Depression - continue home fluoxetine  10 daily

## 2024-04-25 NOTE — Plan of Care (Signed)

## 2024-04-25 NOTE — Progress Notes (Signed)
 [facility] Progress Note  Subjective:  No new c/o's Creat down 6.4 this am No n/v   Vitals:   04/25/24 0027 04/25/24 0723 04/25/24 0955 04/25/24 1013  BP: (!) 186/67 (!) 189/71 (!) 200/73 (!) 183/82  Pulse: 65 68    Resp: 19 18    Temp: 98 F (36.7 C) (!) 97.5 F (36.4 C)    TempSrc: Oral Oral    SpO2: 98% 100%    Weight:      Height:        Exam: Gen alert, no distress Sclera anicteric, throat clear  No jvd or bruits Chest clear bilat to bases RRR no MRG Abd soft ntnd no mass or ascites +bs Ext no LE or UE edema Neuro is alert, Ox 3 , nf     Home bp meds: Clonidine  0.1 bid Valsartan -hydrochlorothiazide  160-25 daily Mobic     Date                             Creat               eGFR (ml/min) 2013- 2019                  1.02- 1.58 2020                            1.58- 1.67 2021                            1.96- 2.16 2022                            2.99- 3.92 Jan- jun 2023              3.59- 4.31 Jul - dec 2023             4.10- 5.04         Feb- may 2024 4.94- 5.93 7- 9 ml/min Nov 2024                     5.50                8 ml/min Jan 12, 2024              5.15                 9 ml/min January 21, 2024               5.70                 8 ml/min                       04/21/24                        7.05                 6 ml/min                       9/05                             6.84  6 ml/min, admit date     UA 9/5 - clear, glu > 500, mod LE, prot > 300, 0-5 rbc, 6-10 wbc CT renal stone study , non contrast 9/05: no hydronephrosis bilat kidneys    Assessment/ Plan: AKI on CKD 5: b/l creatinine 5.7 from June 2025, eGFR 8 ml/min. Creatinine here was 7.0 on admit, 6.8 at time of consult, not uremic, here for back pain and possible discitis awaiting aspiration of the T11-12 space. CKD 5 f/b Dr Davida in [place]. No vol overload or K+ issues. AKI possibly vol related +/- ARB effects. Gave bolus+  IVFs overnight. Creat down slightly.  Will stop IVFs for now w/ increased BP's and need to get BP down prior to IR procedure. Will follow.   T11-T12 MRI changes: possible discitis/ osteomyelitis. IR planning for aspiration today.  HTN: holding ARB/hydrochlorothiazide , have ordered low dose po clonidine  and po hydralazine  to start now, also prn IV hydralazine . Hopefully can get BP down shortly with these.      Myer Fret MD  [facility] 04/25/2024, 10:50 AM  Recent Labs  Lab 04/23/24 2141 04/23/24 2152 04/25/24 0337  HGB 9.2* 9.5*  --   ALBUMIN  --   --  3.0*  CALCIUM  9.1  --  8.9  PHOS  --   --  5.6*  CREATININE 6.84* 6.90* 6.48*  K 4.7 4.6 5.0   No results for input(s): IRON, TIBC, FERRITIN in the last 168 hours. Inpatient medications:  acetaminophen   1,000 mg Oral Q6H   atorvastatin   40 mg Oral Daily   benztropine   1 mg Oral Daily   FLUoxetine   10 mg Oral Daily   heparin   5,000 Units Subcutaneous Q8H   insulin  aspart  0-6 Units Subcutaneous TID WC   pantoprazole   40 mg Oral Daily    sodium chloride  75 mL/hr at 04/25/24 0959   HYDROmorphone  (DILAUDID ) injection, naLOXone  (NARCAN )  injection, oxyCODONE 

## 2024-04-25 NOTE — Consult Note (Signed)
 Chief Complaint: T11/T12 Discitis/Osteomyelitis - IR consulted for image guided disc aspiration  Referring Provider(s): Donah Laymon PARAS, MD  Supervising Physician: Jennefer Rover  Patient Status: [facility] - In-pt  History of Present Illness: Kayla Gregory is a 63 y.o. female with pmhx of CKD, HTN, HLD, DM2. Admitted to hospital after presenting to ED yesterday for right flank pain and MRI thoracic study showing T11-T12 disc space collapse, endplate erosive changes, consistent with discitis/osteomyelitis or severe inflammatory degenerative changes. IR service has now been consulted for T-11-T12 disc aspiration for culture and further diagnostic evaluation.  Today pt having back pain focal to lower thoracic spine. No other complaints.   Pt's BP noted to be 200/73 in the room, has been consistently high in hospital as home BP meds have been held due to poor kidney function. Spoke with resident team who reached out to nephology, planning to give hydralazine  and clonidine  now. IR goal of consistent SBP <180 prior to proceeding. Pending lowered BP, IR planning to proceed with disc aspiration today, 9/7.  DNR/DNI: If pulseless and not breathing No CPR or chest compressions.  In Pre-Arrest Conditions (Patient Is Breathing and Has A Pulse) Do not intubate. Provide all appropriate non-invasive medical interventions. Avoid ICU transfer unless indicated or required.    Patient currently has DNR order in place. Discussion with the patient and family regarding wishes.  The original DNR order is maintained during the procedure and prior treatment limitations are upheld during the procedure.   Past Medical History:  Diagnosis Date   Chronic kidney disease    GERD (gastroesophageal reflux disease)    Hyperlipidemia associated with type 2 diabetes mellitus (HCC)    Hypertension    Liver disease    Schizophrenia [facility])     Past Surgical History:  Procedure Laterality Date   BUNIONECTOMY Left  12/12/2022   Procedure: KELLER BUNION IMPLANT;  Surgeon: Janit Thresa HERO, DPM;  Location: [facility];  Service: Podiatry;  Laterality: Left;   CAPSULOTOMY METATARSOPHALANGEAL Left 12/12/2022   Procedure: CAPSULOTOMY METATARSOPHALANGEAL SECOND;  Surgeon: Janit Thresa HERO, DPM;  Location: [facility] [place];  Service: Podiatry;  Laterality: Left;   CATARACT EXTRACTION W/ INTRAOCULAR LENS  IMPLANT, BILATERAL Bilateral    approx 2020   COLONOSCOPY  06/2020   ECTOPIC PREGNANCY SURGERY     1980s   FOOT SURGERY Bilateral 03/05/2017   @ [facility] by dr b. janit;   right 1st bunionectomy/  right 2nd hammertoe correction;  left and right 5th hammer toe correction   HAMMER TOE SURGERY Left 12/12/2022   Procedure: HAMMER TOE CORRECTION SECOND;  Surgeon: Janit Thresa HERO, DPM;  Location: [facility] [place];  Service: Podiatry;  Laterality: Left;   PTOSIS REPAIR Left 12/25/2017   Procedure: INTERNAL PTOSIS REPAIR LEFT EYE;  Surgeon: Laurie Loyd Redhead, MD;  Location: [facility] OR;  Service: Ophthalmology;  Laterality: Left;   TONSILLECTOMY     age 41   TOTAL HIP ARTHROPLASTY Left 2011    Allergies: Metformin  and related, Norvasc  [amlodipine ], and Omeprazole   Medications: Prior to Admission medications   Medication Sig Start Date End Date Taking? Authorizing Provider  Accu-Chek Softclix Lancets lancets Use as instructed 11/03/23  Yes Cleotilde Perkins, DO  aspirin  EC 81 MG tablet Take 81 mg by mouth daily. Swallow whole.   Yes [provider]  atorvastatin  (LIPITOR) 40 MG tablet TAKE 1 TABLET(40 MG) BY MOUTH DAILY 03/08/24  Yes Mabe, Elna, MD  benztropine  (COGENTIN ) 1 MG tablet Take  by mouth 2 (two) times daily. Per pt take 4 tabs in am and 1 tab at bedtime   Yes [provider]  Blood Glucose Monitoring Suppl (ACCU-CHEK AVIVA PLUS) w/Device KIT 1 application  by Does not apply route 4 (four) times daily -  with meals and at bedtime. 11/03/23  Yes Cleotilde Perkins, DO  Blood  Pressure Monitoring (BLOOD PRESSURE CUFF) MISC 1 each by Does not apply route daily. 07/29/22  Yes Cleotilde Perkins, DO  carboxymethylcellulose (REFRESH PLUS) 0.5 % SOLN Place 1 drop into both eyes 3 (three) times daily as needed.   Yes [provider]  cetirizine  (ZYRTEC ) 10 MG tablet Take 1 tablet (10 mg total) by mouth daily as needed for allergies. 05/21/23  Yes Cleotilde Perkins, DO  cloNIDine  (CATAPRES ) 0.1 MG tablet Take 0.1 mg by mouth 2 (two) times daily.   Yes [provider]  cyclobenzaprine  (FLEXERIL ) 10 MG tablet Take 1 tablet (10 mg total) by mouth at bedtime. 03/22/24  Yes Janit Thresa HERO, DPM  diclofenac  Sodium (VOLTAREN  ARTHRITIS PAIN) 1 % GEL Apply 2 g topically 4 (four) times daily. 01/24/24  Yes Johnie, Carley A, NP  diphenhydrAMINE  (BENADRYL ) 25 MG tablet Take 25 mg by mouth every 4 (four) hours as needed.   Yes [provider]  FLUoxetine  (PROZAC ) 10 MG capsule Take 10 mg by mouth daily.   Yes [provider]  gabapentin  (NEURONTIN ) 100 MG capsule TAKE 1 CAPSULE(100 MG) BY MOUTH AT BEDTIME AS NEEDED 04/09/24  Yes Cleotilde Perkins, DO  glucose blood (ACCU-CHEK AVIVA PLUS) test strip Use to check blood glucose up to four times daily 11/03/23  Yes Cleotilde Perkins, DO  insulin  glargine (LANTUS  SOLOSTAR) 100 UNIT/ML Solostar Pen Inject 10 Units into the skin daily. 11/03/23  Yes Cleotilde Perkins, DO  Insulin  Pen Needle (B-D ULTRAFINE III SHORT PEN) 31G X 8 MM MISC USE DAILY AS DIRECTED 11/03/23  Yes Cleotilde Perkins, DO  Lancet Device MISC Check blood glucose once a day in the AM. 04/13/20  Yes Chandra Toribio POUR, MD  Lancet Devices [facility]) lancets Use as instructed 11/22/16  Yes Diallo, Abdoulaye, MD  meloxicam  (MOBIC ) 15 MG tablet TAKE 1 TABLET(15 MG) BY MOUTH DAILY 03/10/24  Yes Janit Thresa HERO, DPM  pantoprazole  (PROTONIX ) 40 MG tablet Take 1 tablet (40 mg total) by mouth daily. 10/01/23  Yes Cleotilde Perkins, DO  polyethylene glycol (MIRALAX ) 17 g packet Take 17 g by  mouth daily. 01/12/24  Yes Long, Fonda MATSU, MD  senna-docusate (SENOKOT-S) 8.6-50 MG tablet Take 1 tablet by mouth at bedtime as needed for mild constipation or moderate constipation. 01/12/24  Yes Long, Joshua G, MD  traZODone (DESYREL) 50 MG tablet Take 50 mg by mouth at bedtime.   Yes [provider]  triamcinolone  cream (KENALOG ) 0.1 % Apply 1 Application topically 2 (two) times daily. 04/15/23  Yes Cleotilde Perkins, DO  valsartan -hydrochlorothiazide  (DIOVAN -HCT) 160-25 MG tablet TAKE 1 TABLET BY MOUTH DAILY 04/12/24  Yes Cleotilde Perkins, DO  ziprasidone  (GEODON ) 20 MG capsule Take 20 mg by mouth daily with breakfast.   Yes [provider]  lidocaine  (LIDODERM ) 5 % Place 1 patch onto the skin daily. Remove & Discard patch within 12 hours or as directed by MD Patient not taking: Reported on 04/24/2024 01/24/24   Johnie Rumaldo LABOR, NP     Family History  Problem Relation Age of Onset   Heart attack Sister 19   Heart attack Brother 36   Heart attack  Mother 68    Social History   Socioeconomic History   Marital status: Single    Spouse name: Not on file   Number of children: Not on file   Years of education: Not on file   Highest education level: Not on file  Occupational History   Not on file  Tobacco Use   Smoking status: Former    Current packs/day: 0.00    Types: Cigarettes    Start date: 59    Quit date: 2001    Years since quitting: 24.6    Passive exposure: Past   Smokeless tobacco: Never  Vaping Use   Vaping status: Never Used  Substance and Sexual Activity   Alcohol use: No    Comment: per pt quit 2001   Drug use: Not Currently    Comment: 12-04-2022  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   Sexual activity: Not Currently  Other Topics Concern   Not on file  Social History Narrative   She recently moved from [place] to [place] in 2017. 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   Social Drivers of Manufacturing engineer Strain: Not on File (04/12/2018)   Received from [employer]    Financial Resource Strain: 0  Food Insecurity: Food Insecurity Present (04/24/2024)   Hunger Vital Sign    Worried About Running Out of Food in the Last Year: Often true    Ran Out of Food in the Last Year: Often true  Transportation Needs: No Transportation Needs (04/24/2024)   PRAPARE - Administrator, Civil Service (Medical): No    Lack of Transportation (Non-Medical): No  Physical Activity: Not on File (04/12/2018)   Received from [facility]   Physical Activity    Physical Activity: 0  Stress: Not on File (04/12/2018)   Received from [facility]   Stress    Stress: 0  Social Connections: Moderately Isolated (04/24/2024)   Social Connection and Isolation Panel    Frequency of Communication with Friends and Family: More than three times a week    Frequency of Social Gatherings with Friends and Family: More than three times a week    Attends Religious Services: More than 4 times per year    Active Member of [employer] or Organizations: No    Attends Banker Meetings: Never    Marital Status: Never married     Review of Systems: A 12 point ROS discussed and pertinent positives are indicated in the HPI above.  All other systems are negative.  Review of Systems  Musculoskeletal:  Positive for back pain.    Vital Signs: BP (!) 189/71 (BP Location: Right Arm) Comment: Lauraine Norse, DO notified-no new orders  Pulse 68   Temp (!) 97.5 F (36.4 C) (Oral)   Resp 18   Ht 5' 1 (1.549 m)   Wt 140 lb (63.5 kg)   SpO2 100%   BMI 26.45 kg/m   Advance Care Plan: No documents on file  Physical Exam Vitals and nursing note reviewed.  Constitutional:      Appearance: Normal appearance.  HENT:     Mouth/Throat:     Mouth: Mucous membranes are moist.     Pharynx: Oropharynx is clear.  Cardiovascular:     Rate and Rhythm: Normal rate and regular rhythm.     Comments: +  HTN Pulmonary:     Effort: Pulmonary effort is normal.  Breath sounds: Normal breath sounds.  Abdominal:     Palpations: Abdomen is soft.     Tenderness: There is no abdominal tenderness.  Musculoskeletal:     Right lower leg: No edema.     Left lower leg: No edema.     Comments: + ttp focally of midline lower thoracic spine. Some radiation to flanks when moving but flanks not tender to palpation. No overlying abnormality. Distally neurovascularly intact.  Skin:    General: Skin is warm and dry.  Neurological:     Mental Status: She is alert and oriented to person, place, and time. Mental status is at baseline.     Imaging: MR THORACIC SPINE WO CONTRAST Result Date: 04/24/2024 CLINICAL DATA:  Mid-back pain, infection suspected, positive xray/CT. EXAM: MRI THORACIC SPINE WITHOUT CONTRAST TECHNIQUE: Multiplanar, multisequence MR imaging of the thoracic spine was performed. No intravenous contrast was administered. COMPARISON:  CT renal stone protocol from 04/23/2024. FINDINGS: Alignment: At T11-T12, focal kyphosis and approximately 2 mm of anterolisthesis of T11 on T12. Otherwise, no substantial sagittal subluxation. Vertebrae: T11-T12 endplate erosive changes with surrounding adjacent marrow edema. Edema extends into the right T11 and T12 posterior elements. No appreciable disc edema; however, the disc is essentially collapsed. See bony retropulsion results in moderate to severe canal stenosis at this level. Cord:  Normal cord signal. Paraspinal and other soft tissues: Mild paraspinal edema at T11-T12. No drainable fluid collection identified. Disc levels: At T11-T12, moderate to severe canal stenosis as described above. No high-grade stenosis at the other levels. No visible epidural fluid collection. Postcontrast imaging could provide more sensitive evaluation. IMPRESSION: 1. At T11-T12 there is collapse of the disc space with endplate erosive changes, focal kyphosis and T11-T12 marrow edema.  Findings could be secondary to chronic discitis/osteomyelitis or severe inflammatory degenerative changes. 2. Associated bony retropulsion results in moderate to severe canal stenosis at T11-T12. 3. Mild surrounding T11-T12 paraspinal edema. No visible drainable paraspinal or epidural fluid collection. Postcontrast imaging could provide more sensitive evaluation. Electronically Signed   By: Gilmore GORMAN Molt M.D.   On: 04/24/2024 04:04   CT Renal Stone Study Result Date: 04/23/2024 CLINICAL DATA:  Abdominal/flank parenchymal evaluate for kidney stone with urinary obstruction or pyelonephritis. EXAM: CT ABDOMEN AND PELVIS WITHOUT CONTRAST TECHNIQUE: Multidetector CT imaging of the abdomen and pelvis was performed following the standard protocol without IV contrast. RADIATION DOSE REDUCTION: This exam was performed according to the departmental dose-optimization program which includes automated exposure control, adjustment of the mA and/or kV according to patient size and/or use of iterative reconstruction technique. COMPARISON:  CT without contrast 01/12/2024, MRI abdomen without contrast 10/28/2017. FINDINGS: Lower chest: Scattered subpleural scarring change both lung bases. No active process. Calcification in the right coronary artery. The cardiac size is normal.  No pleural or pericardial effusions. Hepatobiliary: No focal liver abnormality is seen without contrast. No gallstones, gallbladder wall thickening, or biliary dilatation. Pancreas: Not well seen due to lack of IV contrast and overlapping abutting stroke. No obvious contour deforming abnormality or ductal dilatation. Spleen: Normal in noncontrast attenuation and size. Adrenals/Urinary Tract: There is no adrenal mass. Again noted 2.5 cm Bosniak 1 cyst in the outer right kidney, Hounsfield density of 7. No follow-up imaging recommended. 1.9 cm Bosniak 1 cyst lower pole right kidney, Hounsfield density is 7. No other contour deforming renal abnormality is  seen. There is a short linear calcification medially in the upper pole of the left kidney, which is believed to be vascular. No true  caliceal stones are noted either side. There are no ureteral stones or hydronephrosis. The bladder wall obscured posteriorly due to artifact from a left hip replacement but normal where visible. Stomach/Bowel: Chronic moderate fold thickening proximal to mid stomach. Probable chronic gastritis. No outlet obstructing mass is seen. The unopacified small bowel is normal caliber without inflammatory changes. Appendix is not seen in this patient. There is moderate fecal stasis. No evidence of colitis or diverticulitis. Vascular/Lymphatic: Moderate aortic and visceral branch vessel atherosclerosis. No AAA. No adenopathy. Reproductive: Uterus and bilateral adnexa are unremarkable. Other: No free fluid, hemorrhage, free air or hernia is seen. Subcutaneous stranding consistent with prior injections again noted to the left and right in the mid abdominal wall. Musculoskeletal: There is progressive T10-11 disc collapse with interval increased lytic/cystic excavation of the opposing inferior T10 and superior T11 vertebral bodies. There is increased reactive change in the adjacent paraspinal fat concerning for paraspinal phlegmon. There is no overt epidural space-occupying abscess, but the findings are worrisome for progressive spondylodiscitis and erosive changes in the peridiscal bone. There is progressive loss of the anterior vertebral height at T11 up to 25%. There is a minimal grade 1 anterolisthesis of T10 on T11. A prominent Schmorl's node in the T12 upper plate is unchanged in appearance, but the lytic process atrial lower may now communicate with a T11-12 peridiscal Schmorl's node in the inferior aspect of the T11 body, therefore involvement of the T11-12 disc space is also possibility. MRI without and with contrast is recommended at this time. Chronic L5 pars defects are again noted with  grade 1 L5-S1 spondylolisthesis and moderate disc space loss. No other significant regional osseous findings. IMPRESSION: 1. Progressive T10-11 disc collapse with interval increased lytic/cystic excavation of the opposing inferior T10 and superior T11 vertebral bodies, with increased reactive change in the adjacent paraspinal fat concerning for paraspinal phlegmon. Findings are worrisome for progressive spondylodiscitis and erosive changes in the peridiscal bone. MRI without and with contrast is recommended at this time. 2. The T11 lytic process now may also communicate with a peridiscal Schmorl's node in the inferior aspect of T11, therefore involvement of the T11-12 disc is not excluded. 3. No urolithiasis or hydronephrosis currently or previously. 4. Constipation and diverticulosis. 5. Aortic and coronary artery atherosclerosis. 6. Chronic moderate fold thickening in the proximal to mid stomach, probably chronic gastritis. 7. 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 Aortic Atherosclerosis (ICD10-I70.0). Electronically Signed   By: Francis Quam M.D.   On: 04/23/2024 23:08    Labs:  CBC: Recent Labs    07/17/23 2344 01/12/24 1114 04/23/24 2141 04/23/24 2152  WBC 8.0 3.6* 4.8  --   HGB 10.0* 10.4* 9.2* 9.5*  HCT 29.4* 31.0* 27.9* 28.0*  PLT 200 201 182  --     COAGS: No results for input(s): INR, APTT in the last 8760 hours.  BMP: Recent Labs    07/17/23 2344 01/12/24 1114 04/21/24 0908 04/23/24 2141 04/23/24 2152 04/25/24 0337  NA 135 138 141 140 139 141  K 3.7 4.8 4.7 4.7 4.6 5.0  CL 101 107 102 104 105 109  CO2 24 24 22  20*  --  24  GLUCOSE 227* 158* 77 221* 221* 161*  BUN 53* 54* 67* 58* 55* 55*  CALCIUM  9.6 9.4 9.3 9.1  --  8.9  CREATININE 5.50* 5.15* 7.05* 6.84* 6.90* 6.48*  GFRNONAA 8* 9*  --  6*  --  7*    LIVER FUNCTION TESTS: Recent  Labs    01/12/24 1114 04/25/24 0337  BILITOT 0.9  --   AST 34  --   ALT 25  --   ALKPHOS 114  --    PROT 6.9  --   ALBUMIN 3.7 3.0*    TUMOR MARKERS: No results for input(s): AFPTM, CEA, CA199, CHROMGRNA in the last 8760 hours.  Assessment and Plan:  MANUELLA BLACKSON is a 63 y.o. female with pmhx of CKD, HTN, HLD, DM2. Admitted to hospital after presenting to ED yesterday for right flank pain and MRI thoracic study showing T11-T12 disc space collapse, endplate erosive changes, consistent with discitis/osteomyelitis or severe inflammatory degenerative changes. IR service has now been consulted for T-11-T12 disc aspiration for culture and further diagnostic evaluation.  Today pt having back pain focal to lower thoracic spine. No other complaints.   Pt's BP noted to be 200/73 in the room, has been consistently high in hospital as home BP meds have been held due to poor kidney function. Spoke with resident team who reached out to nephology, planning to give hydralazine  and clonidine  now. IR goal of consistent SBP <180 prior to proceeding. Pending lowered BP, IR planning to proceed with disc aspiration today, 9/7.  All of the questions were answered and there is agreement to proceed.  Consent signed and in chart.   Thank you for allowing our service to participate in Kayla Gregory 's care.  Electronically Signed: Kimble VEAR Clas, PA-C   04/25/2024, 8:36 AM      I spent a total of 40 Minutes    in face to face in clinical consultation, greater than 50% of which was counseling/coordinating care for T11-T12 disc aspiration

## 2024-04-25 NOTE — Plan of Care (Signed)
  Problem: Education: Goal: Ability to describe self-care measures that may prevent or decrease complications (Diabetes Survival Skills Education) will improve Outcome: Progressing Goal: Individualized Educational Video(s) Outcome: Progressing   Problem: Coping: Goal: Ability to adjust to condition or change in health will improve Outcome: Progressing   Problem: Fluid Volume: Goal: Ability to maintain a balanced intake and output will improve Outcome: Progressing   Problem: Health Behavior/Discharge Planning: Goal: Ability to identify and utilize available resources and services will improve Outcome: Progressing Goal: Ability to manage health-related needs will improve Outcome: Progressing   Problem: Metabolic: Goal: Ability to maintain appropriate glucose levels will improve Outcome: Progressing   Problem: Skin Integrity: Goal: Risk for impaired skin integrity will decrease Outcome: Progressing   Problem: Nutritional: Goal: Maintenance of adequate nutrition will improve Outcome: Progressing Goal: Progress toward achieving an optimal weight will improve Outcome: Progressing   Problem: Tissue Perfusion: Goal: Adequacy of tissue perfusion will improve Outcome: Progressing   Problem: Education: Goal: Knowledge of General Education information will improve Description: Including pain rating scale, medication(s)/side effects and non-pharmacologic comfort measures Outcome: Progressing   Problem: Clinical Measurements: Goal: Ability to maintain clinical measurements within normal limits will improve Outcome: Progressing Goal: Will remain free from infection Outcome: Progressing Goal: Diagnostic test results will improve Outcome: Progressing Goal: Respiratory complications will improve Outcome: Progressing Goal: Cardiovascular complication will be avoided Outcome: Progressing   Problem: Activity: Goal: Risk for activity intolerance will decrease Outcome: Progressing    Problem: Nutrition: Goal: Adequate nutrition will be maintained Outcome: Progressing   Problem: Coping: Goal: Level of anxiety will decrease Outcome: Progressing   Problem: Elimination: Goal: Will not experience complications related to bowel motility Outcome: Progressing Goal: Will not experience complications related to urinary retention Outcome: Progressing   Problem: Pain Managment: Goal: General experience of comfort will improve and/or be controlled Outcome: Progressing   Problem: Skin Integrity: Goal: Risk for impaired skin integrity will decrease Outcome: Progressing

## 2024-04-25 NOTE — Procedures (Signed)
 Interventional Radiology Procedure Note  Procedure: T11-12 disc aspiration  Findings: Please refer to procedural dictation for full description. Scant bloody fluid aspirated, sample sent for culture.  Complications: None immediate  Estimated Blood Loss: < 5 ml  Recommendations: Follow cultures.   Ester Sides, MD

## 2024-04-25 NOTE — Assessment & Plan Note (Addendum)
 Initial presentation and imaging concerning for thoracic discitis.  Per neurosurgery, recommending IR aspiration culture to guide antibiotic course.  Patient stable at this time without pain. - Plan for IR aspiration today - NPO and hold antibiotic treatment in the meantime - Tylenol  PRN - PT/OT eval/treat

## 2024-04-25 NOTE — Progress Notes (Signed)
 disc aspiration site clean dry and intact

## 2024-04-25 NOTE — Progress Notes (Signed)
 Patient ID: Kayla Gregory, female   DOB: Nov 14, 1960, 63 y.o.   MRN: [id number] Patient reasonably comfortable today Reviewed MRIs again and I believe that it will aspiration of the space at T11-T12 would be in patient's best interest to secure diagnosis.

## 2024-04-25 NOTE — Progress Notes (Signed)
     Daily Progress Note Intern Pager: [phone]  Patient name: Kayla Gregory Medical record number: [id number] Date of birth: 08-Jul-1961 Age: 63 y.o. Gender: female  Primary Care Provider: Cleotilde Perkins, DO Consultants: NSGY, IR Code Status: DNR/DNI  Pt Overview and Major Events to Date:  04/24/24: Admitted  Medical Decision Making:  Kayla Gregory 63 y.o. female with PMH CKD, HTN, T2DM admitted for R flank pain and worsening renal function. Initial imaging with paraspinal phlegmon concerning for thoracic discitis, pending IR aspiration for culture. Patient pain has resolved at this time. Pending Nephro recommendations for CKD.   Assessment & Plan Discitis of thoracic region Initial presentation and imaging concerning for thoracic discitis.  Per neurosurgery, recommending IR aspiration culture to guide antibiotic course.  Patient stable at this time without pain. - Plan for IR aspiration today - NPO and hold antibiotic treatment in the meantime - Tylenol  PRN - PT/OT eval/treat CKD (chronic kidney disease) stage 5, GFR less than 15 ml/min (HCC) Creatinine remains elevated to 6.48 this morning with GFR 7.  Baseline creatinine 5.0-5.5. Patient follows with nephrology at [facility], is being considered for renal transplant.  Nephrology consulted. -Appreciate nephrology recommendations -Avoid nephrotoxic agents -AM RFP Chronic health problem HTN - hold home valsartan -hydrochlorothiazide  due to elevated Cr T2DM - very sensitive SSI, may restart home Semglee  10 units as needed HLD - continue home lipitor 40 daily Schizophrenia - Holding home ziprasidone  while patient is NPO, will restart when pt is no longer NPO. Continue benztropine . GERD - continue home protonix  40 daily  Depression - continue home fluoxetine  10 daily  FEN/GI: NPO PPx: Heparin  Dispo: Home pending clinical improvement.   Subjective:  Feeling alright this morning. Pain has resolved. Got some rest overnight.    Objective: Temp:  [97.8 F (36.6 C)-98.4 F (36.9 C)] 98 F (36.7 C) (09/07 0027) Pulse Rate:  [54-79] 65 (09/07 0027) Resp:  [6-22] 19 (09/07 0027) BP: (124-188)/(56-80) 186/67 (09/07 0027) SpO2:  [91 %-100 %] 98 % (09/07 0027) Physical Exam: General: No acute distress. Resting comfortably in room. CV: S1/S2. No extra heart sounds. Warm and well-perfused. Pulm: Breathing comfortably on room air. CTA of anterior fields. No increased WOB. Abd: Soft, non-tender, non-distended. Skin:  Warm, dry. No extremity swelling.  Psych: Pleasant and appropriate.   Laboratory:  RFP Lab Results  Component Value Date   NA 141 04/25/2024   CL 109 04/25/2024   K 5.0 04/25/2024   CO2 24 04/25/2024   BUN 55 (H) 04/25/2024   CREATININE 6.48 (H) 04/25/2024   GFRNONAA 7 (L) 04/25/2024   CALCIUM  8.9 04/25/2024   PHOS 5.6 (H) 04/25/2024   ALBUMIN 3.0 (L) 04/25/2024   GLUCOSE 161 (H) 04/25/2024     Kayla Perkins, MD 04/25/2024, 6:02 AM  PGY-2, [facility] FPTS Intern pager: [phone], text pages welcome Secure chat group [facility] Teaching Service

## 2024-04-26 DIAGNOSIS — M4644 Discitis, unspecified, thoracic region: Secondary | ICD-10-CM | POA: Diagnosis not present

## 2024-04-26 LAB — CBC WITH DIFFERENTIAL/PLATELET
Abs Immature Granulocytes: 0.02 K/uL (ref 0.00–0.07)
Basophils Absolute: 0 K/uL (ref 0.0–0.1)
Basophils Relative: 0 %
Eosinophils Absolute: 0.1 K/uL (ref 0.0–0.5)
Eosinophils Relative: 2 %
HCT: 24.9 % — ABNORMAL LOW (ref 36.0–46.0)
Hemoglobin: 8.4 g/dL — ABNORMAL LOW (ref 12.0–15.0)
Immature Granulocytes: 0 %
Lymphocytes Relative: 23 %
Lymphs Abs: 1.4 K/uL (ref 0.7–4.0)
MCH: 31.9 pg (ref 26.0–34.0)
MCHC: 33.7 g/dL (ref 30.0–36.0)
MCV: 94.7 fL (ref 80.0–100.0)
Monocytes Absolute: 0.4 K/uL (ref 0.1–1.0)
Monocytes Relative: 7 %
Neutro Abs: 3.9 K/uL (ref 1.7–7.7)
Neutrophils Relative %: 68 %
Platelets: 173 K/uL (ref 150–400)
RBC: 2.63 MIL/uL — ABNORMAL LOW (ref 3.87–5.11)
RDW: 12.3 % (ref 11.5–15.5)
WBC: 5.8 K/uL (ref 4.0–10.5)
nRBC: 0 % (ref 0.0–0.2)

## 2024-04-26 LAB — GLUCOSE, CAPILLARY
Glucose-Capillary: 157 mg/dL — ABNORMAL HIGH (ref 70–99)
Glucose-Capillary: 212 mg/dL — ABNORMAL HIGH (ref 70–99)
Glucose-Capillary: 205 mg/dL — ABNORMAL HIGH (ref 70–99)
Glucose-Capillary: 136 mg/dL — ABNORMAL HIGH (ref 70–99)

## 2024-04-26 LAB — RENAL FUNCTION PANEL
Albumin: 3.2 g/dL — ABNORMAL LOW (ref 3.5–5.0)
Anion gap: 9 (ref 5–15)
BUN: 52 mg/dL — ABNORMAL HIGH (ref 8–23)
CO2: 20 mmol/L — ABNORMAL LOW (ref 22–32)
Calcium: 9 mg/dL (ref 8.9–10.3)
Chloride: 108 mmol/L (ref 98–111)
Creatinine, Ser: 6.43 mg/dL — ABNORMAL HIGH (ref 0.44–1.00)
GFR, Estimated: 7 mL/min — ABNORMAL LOW (ref >60)
Glucose, Bld: 173 mg/dL — ABNORMAL HIGH (ref 70–99)
Phosphorus: 4.6 mg/dL (ref 2.5–4.6)
Potassium: 4.4 mmol/L (ref 3.5–5.1)
Sodium: 137 mmol/L (ref 135–145)

## 2024-04-26 LAB — MAGNESIUM: Magnesium: 2.4 mg/dL (ref 1.7–2.4)

## 2024-04-26 MED ORDER — ZIPRASIDONE HCL 20 MG PO CAPS
20.0000 mg | ORAL_CAPSULE | Freq: Every day | ORAL | Status: DC
  Filled 2024-04-26 (×4): qty 1

## 2024-04-26 NOTE — Assessment & Plan Note (Addendum)
 Creatinine remains elevated to 6.43 this morning with GFR 7.  Baseline creatinine 5.0-5.5. Patient follows with nephrology at [facility], is being considered for renal transplant.  Nephrology consulted. - Appreciate nephrology recommendations - Avoid nephrotoxic agents - AM BMP

## 2024-04-26 NOTE — Progress Notes (Signed)
 Daily Progress Note Intern Pager: [phone]  Patient name: Kayla Gregory Medical record number: [id number] Date of birth: October 24, 1960 Age: 63 y.o. Gender: female  Primary Care Provider: Cleotilde Perkins, DO Consultants: Neurosurgery, IR, Nephrology  Code Status: DNR/DNI  Pt Overview and Major Events to Date:  9/6: Admitted   Medical Decision Making:  Aiyana F. Stradley is a 63 year old female with a PMH of CKD, HTN, T2DM admitted for R flank pain and worsening renal function.  Initial imaging with paraspinal phlegmon concerning for thoracic discitis, IR aspiration and culture on 9/7.   Assessment & Plan Discitis of thoracic region S/p T11-12 disc aspiration by IR on 9/7. Initial presentation and imaging concerning for thoracic discitis.   - Continue cefepime  1,000 mg daily, awaiting cultures   - Disc cx - no growth <24 hours  - Blood cx - no growth <24 hours  - Tylenol  PRN - PT/OT eval/treat - AM CBC CKD (chronic kidney disease) stage 5, GFR less than 15 ml/min (HCC) Creatinine remains elevated to 6.43 this morning with GFR 7.  Baseline creatinine 5.0-5.5. Patient follows with nephrology at [facility], is being considered for renal transplant.  Nephrology consulted. - Appreciate nephrology recommendations - Avoid nephrotoxic agents - AM BMP Chronic health problem HTN - hold home valsartan -hydrochlorothiazide  due to elevated Cr T2DM - very sensitive SSI, may restart home Semglee  10 units as needed HLD - continue home lipitor 40 daily Schizophrenia - Restart ziprasidone  today as patient is no longer NPO. Continue benztropine . GERD - continue home protonix  40 daily  Depression - continue home fluoxetine  10 daily     FEN/GI: Renal/carb modified with fluid restriction 1200 mL PPx: Heparin   Dispo: Home pending clinical improvement.   Subjective:  Patient is seen lying in bed this morning.  She states she is doing well.  She is having pain in her back around her procedure site.  She  states that it feels sore in her back.  She explains this morning that she was approved for a transplant in 3 months.  She states she has only received 2 of her own meds since being in the hospital.  I explained that we were holding some of her medications, but we will add them back as appropriate.  Objective: Temp:  [97.5 F (36.4 C)-97.9 F (36.6 C)] 97.8 F (36.6 C) (09/08 0325) Pulse Rate:  [50-65] 53 (09/08 0325) Resp:  [14-20] 16 (09/08 0325) BP: (102-200)/(55-94) 127/57 (09/08 0325) SpO2:  [96 %-100 %] 100 % (09/08 0325) Weight:  [63.5 kg] 63.5 kg (09/07 1659) Physical Exam: General: NAD Cardiovascular: RRR, no M/R/G Respiratory: CTAB, normal work of breathing on room air Abdomen: Soft, nondistended, nontender to palpation MSK: Tenderness to palpation around the T11-12 aspiration site Extremities: Warm, dry, no edema  Laboratory: Most recent CBC Lab Results  Component Value Date   WBC 5.8 04/26/2024   HGB 8.4 (L) 04/26/2024   HCT 24.9 (L) 04/26/2024   MCV 94.7 04/26/2024   PLT 173 04/26/2024   Most recent BMP    Latest Ref Rng & Units 04/26/2024    4:36 AM  BMP  Glucose 70 - 99 mg/dL 826   BUN 8 - 23 mg/dL 52   Creatinine 9.55 - 1.00 mg/dL 3.56   Sodium 864 - 854 mmol/L 137   Potassium 3.5 - 5.1 mmol/L 4.4   Chloride 98 - 111 mmol/L 108   CO2 22 - 32 mmol/L 20   Calcium  8.9 - 10.3 mg/dL 9.0  Imaging/Diagnostic Tests: No new imaging.   Lennie Raguel MATSU, DO 04/26/2024, 7:34 AM  PGY-1, [facility] FPTS Intern pager: [phone], text pages welcome Secure chat group [facility] Teaching Service

## 2024-04-26 NOTE — Assessment & Plan Note (Addendum)
 S/p T11-12 disc aspiration by IR on 9/7. Initial presentation and imaging concerning for thoracic discitis.   - Continue cefepime  1,000 mg daily, awaiting cultures   - Disc cx - no growth <24 hours  - Blood cx - no growth <24 hours  - Tylenol  PRN - PT/OT eval/treat - AM CBC

## 2024-04-26 NOTE — Assessment & Plan Note (Addendum)
 HTN - hold home valsartan -hydrochlorothiazide  due to elevated Cr T2DM - very sensitive SSI, may restart home Semglee  10 units as needed HLD - continue home lipitor 40 daily Schizophrenia - Restart ziprasidone  today as patient is no longer NPO. Continue benztropine . GERD - continue home protonix  40 daily  Depression - continue home fluoxetine  10 daily

## 2024-04-26 NOTE — Plan of Care (Signed)
 Went to see patient at bedside and discuss current assessment and plan. Explained to patient that imaging does not show where this infection may have come from, only that it is present. Upon further discussion, it became apparent that Kayla Gregory's main concern was that she had cancer. I reassured the patient that the imaging showed no cancer, only the infection in her spine which are treating with antibiotics.   Lennie Raguel MATSU, DO [facility], PGY-1  04/26/24 4:07 PM

## 2024-04-26 NOTE — Plan of Care (Signed)
 Patient called clinic to discuss admission with PCP.   Talked to patient about medical diagnosis and treatment so far. She is curious as to why this infection happened in her back. She would also like to talk to someone from the inpatient team to discuss what's going on and why this happened to her. I will forward her concerns to the FMTS team.   Damien Pinal, DO [facility], PGY-3 04/26/24 3:36 PM

## 2024-04-26 NOTE — Progress Notes (Signed)
 Kayla Gregory  Assessment/ Plan: Pt is a 63 y.o. yo female with past medical history significant for type II DM, hypertension, CKD 5 admitted with thoracic discitis.  # AKI on CKD 5 versus progressive CKD 5: b/l creatinine 5.7 from June 2025, eGFR 8 ml/min. Creatinine here was 7.0 on admit, not uremic, here for back pain and possible discitis awaiting aspiration of the T11-12 space. CKD 5 f/b Dr Davida in [place]. No vol overload or K+ issues. AKI possibly vol related +/- ARB effects.  Received IV fluid with creatinine level trending down.   - Volume status acceptable and patient is not uremic.  No need for dialysis.  Patient does not want dialysis and understands that she follows with her nephrologist closely, also following at [facility] for kidney transplant.    #T11-T12 MRI changes: possible discitis/ osteomyelitis. S/p IR aspiration, currently on antibiotics.   # HTN: holding ARB/hydrochlorothiazide , currently on clonidine  and hydralazine .  Monitor BP.    # Anemia: No iron because of infection.  Monitor hemoglobin and transfuse as needed.  Nothing further to add, sign off, please call us  back with question.  Patient follow with her nephrologist after discharge from the hospital.  Subjective: Seen and examined at bedside.  Denies nausea, vomiting, chest pain, shortness of breath.  She is clear about not wanting dialysis and reports following closely with her nephrologist. Objective Vital signs in last 24 hours: Vitals:   04/25/24 1755 04/25/24 1833 04/25/24 1942 04/26/24 0325  BP: (!) 167/72 (!) 155/72 (!) 168/69 (!) 127/57  Pulse:   (!) 52 (!) 53  Resp: 18  20 16   Temp: (!) 97.5 F (36.4 C)  97.6 F (36.4 C) 97.8 F (36.6 C)  TempSrc: Oral  Oral Oral  SpO2: 100%  100% 100%  Weight:      Height:       Weight change:   Intake/Output Summary (Last 24 hours) at 04/26/2024 0857 Last data filed at 04/26/2024 0500 Gross per 24 hour   Intake 645 ml  Output --  Net 645 ml       Labs: RENAL PANEL Recent Labs  Lab 04/21/24 0908 04/23/24 2141 04/23/24 2152 04/25/24 0337 04/26/24 0436  NA 141 140 139 141 137  K 4.7 4.7 4.6 5.0 4.4  CL 102 104 105 109 108  CO2 22 20*  --  24 20*  GLUCOSE 77 221* 221* 161* 173*  BUN 67* 58* 55* 55* 52*  CREATININE 7.05* 6.84* 6.90* 6.48* 6.43*  CALCIUM  9.3 9.1  --  8.9 9.0  MG  --   --   --   --  2.4  PHOS  --   --   --  5.6* 4.6  ALBUMIN  --   --   --  3.0* 3.2*    Liver Function Tests: Recent Labs  Lab 04/25/24 0337 04/26/24 0436  ALBUMIN 3.0* 3.2*   No results for input(s): LIPASE, AMYLASE in the last 168 hours. No results for input(s): AMMONIA in the last 168 hours. CBC: Recent Labs    07/17/23 2344 01/12/24 1114 04/23/24 2141 04/23/24 2152 04/26/24 0436  HGB 10.0* 10.4* 9.2* 9.5* 8.4*  MCV 90.5 94.8 95.9  --  94.7    Cardiac Enzymes: No results for input(s): CKTOTAL, CKMB, CKMBINDEX, TROPONINI in the last 168 hours. CBG: Recent Labs  Lab 04/25/24 0739 04/25/24 1229 04/25/24 1537 04/25/24 2121 04/26/24 0608  GLUCAP 148* 150* 140* 160* 157*  Iron Studies: No results for input(s): IRON, TIBC, TRANSFERRIN, FERRITIN in the last 72 hours. Studies/Results: IR THORACIC DISC ASPIRATION W/IMG GUIDE Result Date: 04/26/2024 INDICATION: 63 year old female with concern for T11-T12 osteomyelitis discitis. EXAM: T11-T12 DISC ASPIRATION UNDER FLUOROSCOPY MEDICATIONS: Lidocaine  1% subcutaneous ANESTHESIA/SEDATION: Moderate (conscious) sedation was employed during this procedure. A total of Versed  1 mg and Fentanyl  25 mcg was administered intravenously. Moderate Sedation Time: 8 minutes. The patient's level of consciousness and vital signs were monitored continuously by radiology nursing throughout the procedure under my direct supervision. PROCEDURE: Informed written consent was obtained from the patient after a thorough discussion of the  procedural risks, benefits and alternatives. All questions were addressed. Maximal Sterile Barrier Technique was utilized including caps, mask, sterile gowns, sterile gloves, sterile drape, hand hygiene and skin antiseptic. A timeout was performed prior to the initiation of the procedure. The appropriate interspace was identified under fluoroscopy, corresponding to previous cross-sectional imaging. An appropriate skin entry site was determined. After local infiltration with 1% lidocaine , a 16 gauge trocar needle was advanced into the interspace from right posterolateral extraforaminal approach. Needle tip position within the interspace confirmed on biplane images. Scant sanguinous fluid was aspirated, sent for the requested laboratory studies. The patient tolerated the procedure well. FLUOROSCOPY TIME:  Eleven mGy COMPLICATIONS: None immediate. IMPRESSION: Technically successful T11-T12 disc aspiration under fluoroscopy. Ester Sides, MD [facility] Radiology Electronically Signed   By: Ester Sides M.D.   On: 04/26/2024 07:48    Medications: Infusions:  ceFEPime  (MAXIPIME ) IV Stopped (04/25/24 2027)    Scheduled Medications:  acetaminophen   1,000 mg Oral Q6H   atorvastatin   40 mg Oral Daily   benztropine   1 mg Oral Daily   cloNIDine   0.1 mg Oral TID   FLUoxetine   10 mg Oral Daily   heparin   5,000 Units Subcutaneous Q8H   insulin  aspart  0-6 Units Subcutaneous TID WC   pantoprazole   40 mg Oral Daily   polycarbophil  625 mg Oral Daily    have reviewed scheduled and prn medications.  Physical Exam: General:NAD, comfortable Heart:RRR, s1s2 nl Lungs:clear b/l, no crackle Abdomen:soft, Non-tender, non-distended Extremities:No edema Neurology: Alert, awake and following commands.  No history of seizure.  Lindsy Cerullo Prasad Malyssa Maris 04/26/2024,8:57 AM  LOS: 2 days

## 2024-04-26 NOTE — TOC Initial Note (Signed)
 Transition of Care [facility]) - Initial/Assessment Note    Patient Details  Name: Kayla Gregory MRN: [id number] Date of Birth: 01/09/1961  Transition of Care [facility]) CM/SW Contact:    Andrez JULIANNA George, RN Phone Number: 04/26/2024, 2:25 PM  Clinical Narrative:                 Kayla Gregory is a 63 y.o. female with past medical history of HTN, T2DM, HLD, CKD presenting with right flank pain and worsening kidney function.   Pt is from home alone. Pt states her nephew can check in on her and will assist as needed.  Pt uses her medicaid transportation services for appointments. Pt states she will need assist with transportation home at time of dc.  Pt manages her own medications and denies any issues.   IP Care management following for dc needs.   Expected Discharge Plan: Home w Home Health Services Barriers to Discharge: Continued Medical Work up   Patient Goals and CMS Choice            Expected Discharge Plan and Services   Discharge Planning Services: CM Consult   Living arrangements for the past 2 months: Apartment                                      Prior Living Arrangements/Services Living arrangements for the past 2 months: Apartment Lives with:: Self Patient language and need for interpreter reviewed:: Yes Do you feel safe going back to the place where you live?: Yes          Current home services: DME (cane) Criminal Activity/Legal Involvement Pertinent to Current Situation/Hospitalization: No - Comment as needed  Activities of Daily Living   ADL Screening (condition at time of admission) Independently performs ADLs?: Yes (appropriate for developmental age) Is the patient deaf or have difficulty hearing?: No Does the patient have difficulty seeing, even when wearing glasses/contacts?: No Does the patient have difficulty concentrating, remembering, or making decisions?: No  Permission Sought/Granted                  Emotional  Assessment Appearance:: Appears stated age Attitude/Demeanor/Rapport: Engaged Affect (typically observed): Accepting Orientation: : Oriented to Self, Oriented to Place, Oriented to  Time, Oriented to Situation   Psych Involvement: No (comment)  Admission diagnosis:  End stage renal disease (HCC) [N18.6] Discitis of thoracic region [M46.44] Anemia associated with chronic renal failure [N18.9, D63.1] Mid-back pain, acute [M54.9] Patient Active Problem List   Diagnosis Date Noted   Discitis of thoracic region 04/24/2024   CKD (chronic kidney disease) stage 5, GFR less than 15 ml/min (HCC) 04/24/2024   Chronic health problem 04/24/2024   Hyperlipidemia associated with type 2 diabetes mellitus (HCC) 12/27/2022   Family history of malignant neoplasm of gastrointestinal tract 09/26/2020   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   Peripheral neuropathy 10/04/2019   CKD (chronic kidney disease), stage IV (HCC) 04/06/2019   Chronic sinusitis 12/14/2018   Chronic left shoulder pain 12/27/2016   Stable angina (HCC) 12/22/2015   GERD (gastroesophageal reflux disease) 12/22/2015   Chronic hepatitis C (HCC) 12/22/2015   DM (diabetes mellitus) (HCC) 03/31/2014   Essential hypertension 03/31/2014   PCP:  Cleotilde Perkins, DO Pharmacy:   [facility] [phone] - [place], [place] - [address] AT [facility] [address] [place] [address] Phone: [phone]  Fax: [phone]  [id number] [place], [place] - [address] AT [facility] OF [address] [place] [address] Phone: [phone] Fax: [phone]     Social Drivers of Health (SDOH) Social History: SDOH Screenings   Food Insecurity: Food Insecurity Present (04/24/2024)  Housing: Low Risk  (04/24/2024)  Transportation Needs: No Transportation Needs (04/24/2024)  Utilities: Not At Risk (04/24/2024)  Depression (PHQ2-9): Low Risk  (04/21/2024)   Financial Resource Strain: Not on File (04/12/2018)   [facility]  Physical Activity: Not on File (04/12/2018)   Received from [facility]  Social Connections: Moderately Isolated (04/24/2024)  Stress: Not on File (04/12/2018)   Received from [facility]  Tobacco Use: Medium Risk (04/23/2024)   SDOH Interventions:     Readmission Risk Interventions     No data to display

## 2024-04-26 NOTE — Progress Notes (Signed)
 Patient ID: Kayla Gregory, female   DOB: [birth year], 63 y.o.   MRN: [id number] Patient underwent a laceration of the space at T11-T12 yesterday.  Gram stain shows rare gram-negative rods.  Cultures are still pending for identification.  I believe an infectious disease consult is in order.

## 2024-04-27 DIAGNOSIS — M4644 Discitis, unspecified, thoracic region: Secondary | ICD-10-CM | POA: Diagnosis not present

## 2024-04-27 LAB — CBC
HCT: 23.9 % — ABNORMAL LOW (ref 36.0–46.0)
Hemoglobin: 8 g/dL — ABNORMAL LOW (ref 12.0–15.0)
MCH: 31.4 pg (ref 26.0–34.0)
MCHC: 33.5 g/dL (ref 30.0–36.0)
MCV: 93.7 fL (ref 80.0–100.0)
Platelets: 146 K/uL — ABNORMAL LOW (ref 150–400)
RBC: 2.55 MIL/uL — ABNORMAL LOW (ref 3.87–5.11)
RDW: 12.2 % (ref 11.5–15.5)
WBC: 4.7 K/uL (ref 4.0–10.5)
nRBC: 0 % (ref 0.0–0.2)

## 2024-04-27 LAB — GLUCOSE, CAPILLARY
Glucose-Capillary: 155 mg/dL — ABNORMAL HIGH (ref 70–99)
Glucose-Capillary: 238 mg/dL — ABNORMAL HIGH (ref 70–99)
Glucose-Capillary: 163 mg/dL — ABNORMAL HIGH (ref 70–99)

## 2024-04-27 LAB — BASIC METABOLIC PANEL WITH GFR
Anion gap: 11 (ref 5–15)
BUN: 55 mg/dL — ABNORMAL HIGH (ref 8–23)
CO2: 20 mmol/L — ABNORMAL LOW (ref 22–32)
Calcium: 9 mg/dL (ref 8.9–10.3)
Chloride: 106 mmol/L (ref 98–111)
Creatinine, Ser: 7.02 mg/dL — ABNORMAL HIGH (ref 0.44–1.00)
GFR, Estimated: 6 mL/min — ABNORMAL LOW (ref >60)
Glucose, Bld: 180 mg/dL — ABNORMAL HIGH (ref 70–99)
Potassium: 4.7 mmol/L (ref 3.5–5.1)
Sodium: 137 mmol/L (ref 135–145)

## 2024-04-27 MED ORDER — OXYCODONE HCL 5 MG PO TABS: 2.5000 mg | ORAL_TABLET | Freq: Three times a day (TID) | ORAL | Status: DC | PRN

## 2024-04-27 MED ORDER — CLONIDINE HCL 0.1 MG PO TABS
0.1000 mg | ORAL_TABLET | Freq: Two times a day (BID) | ORAL | Status: DC
Start: 2024-04-27 — End: 2024-04-29
  Administered 2024-04-27 – 2024-04-29 (×4): 0.1 mg via ORAL
  Filled 2024-04-27 (×4): qty 1

## 2024-04-27 MED ORDER — OXYCODONE HCL 5 MG PO TABS
5.0000 mg | ORAL_TABLET | Freq: Three times a day (TID) | ORAL | Status: DC | PRN
  Administered 2024-04-28 – 2024-04-29 (×2): 5 mg via ORAL
  Filled 2024-04-27 (×2): qty 1

## 2024-04-27 MED ORDER — HYDROMORPHONE HCL 1 MG/ML IJ SOLN
0.2500 mg | INTRAMUSCULAR | Status: DC | PRN
  Administered 2024-04-28: 0.25 mg via INTRAVENOUS
  Filled 2024-04-27: qty 0.5

## 2024-04-27 MED ORDER — ASPIRIN 81 MG PO TBEC
81.0000 mg | DELAYED_RELEASE_TABLET | Freq: Every day | ORAL | Status: DC
  Administered 2024-04-27 – 2024-04-29 (×3): 81 mg via ORAL
  Filled 2024-04-27 (×3): qty 1

## 2024-04-27 NOTE — Care Management Important Message (Signed)
 Important Message  Patient Details  Name: Kayla Gregory MRN: [id number] Date of Birth: 1961/03/23   Important Message Given:  Yes - Medicare IM     Claretta Deed 04/27/2024, 3:24 PM

## 2024-04-27 NOTE — Assessment & Plan Note (Addendum)
 Creatinine 7.02 this morning with GFR 6.  Baseline creatinine 5.0-5.5. Patient follows with nephrology at [facility], is being considered for renal transplant.  Nephrology consulted. - Appreciate nephrology recommendations - Avoid nephrotoxic agents - AM BMP

## 2024-04-27 NOTE — Assessment & Plan Note (Addendum)
 HTN - hold home valsartan -hydrochlorothiazide  due to elevated Cr, home Clonidine  0.1 mg BID T2DM - very sensitive SSI, may restart home Semglee  10 units as needed HLD - continue home lipitor 40 daily Schizophrenia - Restart ziprasidone  today as patient is no longer NPO. Continue benztropine .  (Patient is refusing d/t wanting to be awake/with it while here, will schedule for evening) GERD - continue home protonix  40 daily  Depression - continue home fluoxetine  10 daily History of stable angina - restart home ASA 81 mg

## 2024-04-27 NOTE — Progress Notes (Addendum)
 Daily Progress Note Intern Pager: [phone]  Patient name: Kayla Gregory Medical record number: [id number] Date of birth: [birth year] Age: 63 y.o. Gender: female  Primary Care Provider: Cleotilde Perkins, DO Consultants: Neurosurgery, IR, Nephrology  Code Status: DNR/DNI  Pt Overview and Major Events to Date:  9/6: Admitted   Medical Decision Making:  Kayla Gregory is a 63 y.o. female with a PMH of CKD, HTN, T2DM admitted for R flank pain and worsening renal function. Initial imaging with paraspinal phlegmon concerning for thoracic discitis, IR aspiration and culture on 9/7.  Assessment & Plan Discitis of thoracic region S/p T11-12 disc aspiration by IR on 9/7. Will consult ID today.  - Continue cefepime  1,000 mg daily, awaiting cultures   - Disc cx - no growth <24 hours, gram negative rods   - Blood cx - no growth 2 days  - Appreciate neurosurgery recommendations  - Pain management  - Tylenol  1,000 mg Q6H scheduled  - oxycodone  2.5 mg Q8H PRN moderate pain - oxycodone  5 mg Q8H PRN severe pain - Dilaudid  0.25 mg Q4H PRN for breakthrough pain  - PT/OT eval/treat - AM CBC CKD (chronic kidney disease) stage 5, GFR less than 15 ml/min (HCC) Creatinine 7.02 this morning with GFR 6.  Baseline creatinine 5.0-5.5. Patient follows with nephrology at [facility], is being considered for renal transplant.  Nephrology consulted. - Appreciate nephrology recommendations - Avoid nephrotoxic agents - AM BMP Chronic health problem HTN - hold home valsartan -hydrochlorothiazide  due to elevated Cr, home Clonidine  0.1 mg BID T2DM - very sensitive SSI, may restart home Semglee  10 units as needed HLD - continue home lipitor 40 daily Schizophrenia - Restart ziprasidone  today as patient is no longer NPO. Continue benztropine .  (Patient is refusing d/t wanting to be awake/with it while here, will schedule for evening) GERD - continue home protonix  40 daily  Depression - continue home fluoxetine  10  daily History of stable angina - restart home ASA 81 mg      FEN/GI: Renal/carb modified with fluid restriction 1200 mL  PPx: Heparin   Dispo: Home pending clinical improvement.  Subjective:  Patient is seen lying in bed.  States she is currently having back pain and that she had back pain all night.  She states the Tylenol  is not helping her.  She rates her pain as a 9/10.  She denies any other pain or complaints at this time.  She denies any more abdominal pain, cramping, diarrhea.  Objective: Temp:  [97.6 F (36.4 C)-98.3 F (36.8 C)] 97.7 F (36.5 C) (09/09 0750) Pulse Rate:  [45-58] 51 (09/09 0750) Resp:  [18-19] 18 (09/09 0750) BP: (129-161)/(57-69) 161/64 (09/09 0750) SpO2:  [97 %-100 %] 99 % (09/09 0750) Weight:  [63.8 kg] 63.8 kg (09/09 0500) Physical Exam: General: NAD Cardiovascular: RRR, no M/R/G Respiratory: CTAB, normal work of breathing on room air Abdomen: Soft, nondistended, nontender to palpation MSK: Tenderness to light palpation of the right side of the thoracic spine around the site of patient's aspiration (T11-12) as well as flank and lumbar on the right side Extremities: Warm, dry, no edema  Laboratory: Most recent CBC Lab Results  Component Value Date   WBC 4.7 04/27/2024   HGB 8.0 (L) 04/27/2024   HCT 23.9 (L) 04/27/2024   MCV 93.7 04/27/2024   PLT 146 (L) 04/27/2024   Most recent BMP    Latest Ref Rng & Units 04/27/2024    4:29 AM  BMP  Glucose 70 - 99  mg/dL 819   BUN 8 - 23 mg/dL 55   Creatinine 9.55 - 1.00 mg/dL 2.97   Sodium 864 - 854 mmol/L 137   Potassium 3.5 - 5.1 mmol/L 4.7   Chloride 98 - 111 mmol/L 106   CO2 22 - 32 mmol/L 20   Calcium  8.9 - 10.3 mg/dL 9.0    Imaging/Diagnostic Tests: No new imaging.   Lennie Raguel MATSU, DO 04/27/2024, 9:32 AM  PGY-1, [facility] FPTS Intern pager: [phone], text pages welcome Secure chat group [facility] Teaching Service

## 2024-04-27 NOTE — Assessment & Plan Note (Addendum)
 S/p T11-12 disc aspiration by IR on 9/7. Will consult ID today.  - Continue cefepime  1,000 mg daily, awaiting cultures   - Disc cx - no growth <24 hours, gram negative rods   - Blood cx - no growth 2 days  - Appreciate neurosurgery recommendations  - Pain management  - Tylenol  1,000 mg Q6H scheduled  - oxycodone  2.5 mg Q8H PRN moderate pain - oxycodone  5 mg Q8H PRN severe pain - Dilaudid  0.25 mg Q4H PRN for breakthrough pain  - PT/OT eval/treat - AM CBC

## 2024-04-27 NOTE — Plan of Care (Signed)

## 2024-04-28 ENCOUNTER — Inpatient Hospital Stay (HOSPITAL_COMMUNITY)

## 2024-04-28 DIAGNOSIS — M4634 Infection of intervertebral disc (pyogenic), thoracic region: Principal | ICD-10-CM

## 2024-04-28 DIAGNOSIS — N185 Chronic kidney disease, stage 5: Secondary | ICD-10-CM

## 2024-04-28 DIAGNOSIS — M4644 Discitis, unspecified, thoracic region: Secondary | ICD-10-CM | POA: Diagnosis not present

## 2024-04-28 DIAGNOSIS — M4624 Osteomyelitis of vertebra, thoracic region: Secondary | ICD-10-CM

## 2024-04-28 LAB — BASIC METABOLIC PANEL WITH GFR
Anion gap: 11 (ref 5–15)
BUN: 53 mg/dL — ABNORMAL HIGH (ref 8–23)
CO2: 20 mmol/L — ABNORMAL LOW (ref 22–32)
Calcium: 9.2 mg/dL (ref 8.9–10.3)
Chloride: 107 mmol/L (ref 98–111)
Creatinine, Ser: 6.94 mg/dL — ABNORMAL HIGH (ref 0.44–1.00)
GFR, Estimated: 6 mL/min — ABNORMAL LOW (ref >60)
Glucose, Bld: 154 mg/dL — ABNORMAL HIGH (ref 70–99)
Potassium: 4.2 mmol/L (ref 3.5–5.1)
Sodium: 138 mmol/L (ref 135–145)

## 2024-04-28 LAB — CBC
HCT: 25.9 % — ABNORMAL LOW (ref 36.0–46.0)
Hemoglobin: 8.9 g/dL — ABNORMAL LOW (ref 12.0–15.0)
MCH: 31.3 pg (ref 26.0–34.0)
MCHC: 34.4 g/dL (ref 30.0–36.0)
MCV: 91.2 fL (ref 80.0–100.0)
Platelets: 172 K/uL (ref 150–400)
RBC: 2.84 MIL/uL — ABNORMAL LOW (ref 3.87–5.11)
RDW: 12.4 % (ref 11.5–15.5)
WBC: 5.1 K/uL (ref 4.0–10.5)
nRBC: 0 % (ref 0.0–0.2)

## 2024-04-28 LAB — GLUCOSE, CAPILLARY
Glucose-Capillary: 149 mg/dL — ABNORMAL HIGH (ref 70–99)
Glucose-Capillary: 191 mg/dL — ABNORMAL HIGH (ref 70–99)
Glucose-Capillary: 203 mg/dL — ABNORMAL HIGH (ref 70–99)
Glucose-Capillary: 166 mg/dL — ABNORMAL HIGH (ref 70–99)

## 2024-04-28 MED ORDER — ONDANSETRON 4 MG PO TBDP: 4.0000 mg | ORAL_TABLET | Freq: Three times a day (TID) | ORAL | Status: DC | PRN

## 2024-04-28 MED ORDER — ONDANSETRON 4 MG PO TBDP
4.0000 mg | ORAL_TABLET | Freq: Once | ORAL | Status: AC
  Administered 2024-04-28: 4 mg via ORAL
  Filled 2024-04-28: qty 1

## 2024-04-28 MED ORDER — CIPROFLOXACIN HCL 500 MG PO TABS
500.0000 mg | ORAL_TABLET | Freq: Every day | ORAL | Status: DC
  Administered 2024-04-28: 500 mg via ORAL
  Filled 2024-04-28 (×2): qty 1

## 2024-04-28 NOTE — Assessment & Plan Note (Addendum)
 Creatinine 6.94 this morning with GFR 6.  Baseline creatinine 5.0-5.5. Patient follows with nephrology at [facility], is being considered for renal transplant.  Nephrology consulted. - Appreciate nephrology recommendations - Avoid nephrotoxic agents - AM BMP

## 2024-04-28 NOTE — Assessment & Plan Note (Signed)
 HTN - hold home valsartan -hydrochlorothiazide  due to elevated Cr, home Clonidine  0.1 mg BID T2DM - very sensitive SSI, may restart home Semglee  10 units as needed HLD - continue home lipitor 40 daily Schizophrenia - Restart ziprasidone  today as patient is no longer NPO. Continue benztropine .  (Patient is refusing d/t wanting to be awake/with it while here, will schedule for evening) GERD - continue home protonix  40 daily  Depression - continue home fluoxetine  10 daily History of stable angina - restart home ASA 81 mg

## 2024-04-28 NOTE — Plan of Care (Signed)
 Consulted infectious disease, Dr. Dennise, for patient's thoracic discitis per Neurosurgery's recommendation. Gram stain of aspiration showed rare gram negative rods. Aspiration and blood cultures both with no growth to date.

## 2024-04-28 NOTE — Plan of Care (Signed)

## 2024-04-28 NOTE — Consult Note (Signed)
 [facility]    Date of Admission:  04/23/2024   Total days of inpatient antibiotics 4        Reason for Consult: Osteomyelitis    Principal Problem:   Discitis of thoracic region Active Problems:   CKD (chronic kidney disease) stage 5, GFR less than 15 ml/min (HCC)   Chronic health problem   Assessment: 63-year-old female with past medical history of CKD, hypertension, diabetes type 2 admitted with right flank pain and worsening renal function.  Imaging showed concern for thoracic discitis/osteomyelitis ID engaged #T11-T12 discussed/osteomyelitis with paraspinal phlegmon - Underwent aspiration on 9/7 with radiology cultures no growth, Gram stain showed GNR - On review imaging from outside hospital there was a CT done on 4/14 that showed large 18 mm superior endplate lucency at T11, may reflect Schmorl's node. - CT renal study showed progressive T10-11 disc collapse, paraspinal fat concerning for paraspinal phlegmon with a lytic process may communicate with Schmorl's node.  - MRI spine showed T11-12 collapse status post with endplate changes, marrow edema concerning for chronic discitis/osteomyelitis of severe degenerative , paraspinal edema no drainable collection  #CKD stage V  Recommendations: - Cefepime  was started after IR aspiration.  No growth so far.  Gram stain slowed rare GNR.  Given that discitis/osteomyelitis and paraspinal phlegmon findings are new we will plan on treatment with 8 weeks antibiotics.  Repeat MR towards end of treatment.  ID follow-up on 10/2 - DC cefepime  start ciprofloxacin , renally dosed per pharmacy. 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  If lesion has worsened then we will get cultures with AFB as well.  Discussion with patient, she has normal latent TB  for years.  Has not had treatment. - Follow-up with infectious disease clinic 10/2 to discuss treatment for latent TB   ID will SO Microbiology:   Antibiotics: Cefepime  9/7-present  Cultures: Blood 9/6 no growth Urine  Other 9/7 Gram stain rare GNR, cultures no growth x 3 days  HPI: Kayla Gregory is a 63 y.o. female with past medical history of hypertension, diabetes type 2 with hyperlipidemia, CKD stage V, remote IVDA use about 27 years ago presented with right flank pain and worsening kidney function.  Patient afebrile without leukocytosis on admission.  CT renal study showed progressive T10-11 disc collapse, paraspinal fat concerning for paraspinal phlegmon 63-year-old man with a process may communicate with Schmorl's node.  MRI spine showed T11-12 collapse status post with endplate changes, marrow edema concerning for chronic discitis/osteomyelitis of severe degenerative , paraspinal edema no drainable collection.  IR was engaged patient underwent aspiration of disc, Gram stain showing GNR.   Review of Systems: Review of Systems  All other systems reviewed and are negative.   Past Medical History:  Diagnosis Date   Chronic kidney disease    GERD (gastroesophageal reflux disease)    Hyperlipidemia associated with type 2 diabetes mellitus (HCC)    Hypertension    Liver disease    Schizophrenia (HCC)     Social History   Tobacco Use   Smoking status: Former    Current packs/day: 0.00    Types: Cigarettes    Start date: 6    Quit date: 2001    Years since quitting: 24.7    Passive  exposure: Past   Smokeless tobacco: Never  Vaping Use   Vaping status: Never Used  Substance Use Topics   Alcohol use: No    Comment: per pt quit 2001   Drug use: Not Currently    Comment: 12-04-2022  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    Family History  Problem Relation Age of Onset   Heart attack Sister 72   Heart attack Brother 54   Heart attack Mother  70   Scheduled Meds:  acetaminophen   1,000 mg Oral Q6H   aspirin  EC  81 mg Oral Daily   atorvastatin   40 mg Oral Daily   benztropine   1 mg Oral Daily   cloNIDine   0.1 mg Oral BID   FLUoxetine   10 mg Oral Daily   heparin   5,000 Units Subcutaneous Q8H   insulin  aspart  0-6 Units Subcutaneous TID WC   pantoprazole   40 mg Oral Daily   polycarbophil  625 mg Oral Daily   ziprasidone   20 mg Oral Q breakfast   Continuous Infusions:  ceFEPime  (MAXIPIME ) IV 1,000 mg (04/27/24 2052)   PRN Meds:.hydrALAZINE , HYDROmorphone  (DILAUDID ) injection, naLOXone  (NARCAN )  injection, oxyCODONE  **OR** oxyCODONE  Allergies  Allergen Reactions   Metformin  And Related Diarrhea and Other (See Comments)    GI symptoms including abdominal pain and diarrhea - Pt not interested in taking again   Norvasc  [Amlodipine ] Itching   Omeprazole  Rash, Dermatitis and Other (See Comments)    Denies airway involvement    OBJECTIVE: Blood pressure (!) 146/67, pulse (!) 55, temperature 98.3 F (36.8 C), resp. rate 19, height 5' 1 (1.549 m), weight 63.8 kg, SpO2 100%.  Physical Exam Constitutional:      Appearance: Normal appearance.  HENT:     Head: Normocephalic and atraumatic.     Right Ear: Tympanic membrane normal.     Left Ear: Tympanic membrane normal.     Nose: Nose normal.     Mouth/Throat:     Mouth: Mucous membranes are moist.  Eyes:     Extraocular Movements: Extraocular movements intact.     Conjunctiva/sclera: Conjunctivae normal.     Pupils: Pupils are equal, round, and reactive to light.  Cardiovascular:     Rate and Rhythm: Normal rate and regular rhythm.     Heart sounds: No murmur heard.    No friction rub. No gallop.  Pulmonary:     Effort: Pulmonary effort is normal.     Breath sounds: Normal breath sounds.  Abdominal:     General: Abdomen is flat.     Palpations: Abdomen is soft.  Musculoskeletal:        General: Normal range of motion.  Skin:    General: Skin is warm and dry.   Neurological:     General: No focal deficit present.     Mental Status: She is alert and oriented to person, place, and time.  Psychiatric:        Mood and Affect: Mood normal.     Lab Results Lab Results  Component Value Date   WBC 5.1 04/28/2024   HGB 8.9 (L) 04/28/2024   HCT 25.9 (L) 04/28/2024   MCV 91.2 04/28/2024   PLT 172 04/28/2024    Lab Results  Component Value Date   CREATININE 6.94 (H) 04/28/2024   BUN 53 (H) 04/28/2024   NA 138 04/28/2024   K 4.2 04/28/2024   CL 107 04/28/2024   CO2 20 (L) 04/28/2024    Lab Results  Component Value Date   ALT 25 01/12/2024   AST 34 01/12/2024   ALKPHOS 114 01/12/2024   BILITOT 0.9 01/12/2024       Loney Stank, MD [facility] 04/28/2024, 12:40 PM Evaluation of this patient requires complex antimicrobial therapy evaluation and counseling + isolation needs for disease transmission risk assessment and mitigation

## 2024-04-28 NOTE — Progress Notes (Signed)
 Daily Progress Note Intern Pager: [phone]  Patient name: Kayla Gregory Medical record number: [id number] Date of birth: 02/08/1961 Age: 63 y.o. Gender: female  Primary Care Provider: Cleotilde Perkins, DO Consultants: Neurosurgery, IR, Nephrology Code Status: DNR/DNI  Pt Overview and Major Events to Date:  9/6: Admitted   Medical Decision Making:  Kayla Gregory is a 62 y.o. female with a PMH of CKD, HTN, T2DM admitted for R flank pain and worsening renal function. Initial imaging with paraspinal phlegmon concerning for thoracic discitis, IR aspiration and culture on 9/7.  Assessment & Plan Discitis of thoracic region S/p T11-12 disc aspiration by IR on 9/7. EKG showed sinus bradycardia.  - Continue cefepime  1,000 mg daily, awaiting cultures   - Disc cx - no growth 2 days, gram negative rods   - Blood cx - no growth 3 days  - Appreciate neurosurgery recommendations  - ID consulted, appreciate recommendations  - Zofran  OTD 4 mg for nausea  - Pain management  - Tylenol  1,000 mg Q6H scheduled  - oxycodone  2.5 mg Q8H PRN moderate pain - oxycodone  5 mg Q8H PRN severe pain - Dilaudid  0.25 mg Q4H PRN for breakthrough pain  - PT/OT eval/treat - AM CBC CKD (chronic kidney disease) stage 5, GFR less than 15 ml/min (HCC) Creatinine 6.94 this morning with GFR 6.  Baseline creatinine 5.0-5.5. Patient follows with nephrology at [facility], is being considered for renal transplant.  Nephrology consulted. - Appreciate nephrology recommendations - Avoid nephrotoxic agents - AM BMP Chronic health problem HTN - hold home valsartan -hydrochlorothiazide  due to elevated Cr, home Clonidine  0.1 mg BID T2DM - very sensitive SSI, may restart home Semglee  10 units as needed HLD - continue home lipitor 40 daily Schizophrenia - Restart ziprasidone  today as patient is no longer NPO. Continue benztropine .  (Patient is refusing d/t wanting to be awake/with it while here, will schedule for evening) GERD -  continue home protonix  40 daily  Depression - continue home fluoxetine  10 daily History of stable angina - restart home ASA 81 mg    FEN/GI: Renal/carb modified with fluid restriction 1200 mL PPx: Heparin  Dispo: Home pending cultures and antibiotic regimen.   Subjective:  Patient continues to endorse 9/10 pain right around where her bandage is covering her aspiration site. She states she feels like her pain is worse today. She is also feeling nauseous now and stated she threw up once last night.   Objective: Temp:  [97.7 F (36.5 C)-98.7 F (37.1 C)] 98.3 F (36.8 C) (09/10 0748) Pulse Rate:  [48-59] 59 (09/10 0748) Resp:  [18-20] 18 (09/10 0504) BP: (142-181)/(62-73) 172/62 (09/10 0748) SpO2:  [97 %-100 %] 100 % (09/10 0748) Physical Exam: General: ill-appearing female, NAD Cardiovascular: RRR, no m/r/g Respiratory: CTAB, normal work of breathing on room air  Abdomen: soft, non-distended, non-tender to palpation Extremities: warm, dry, no edema  Laboratory: Most recent CBC Lab Results  Component Value Date   WBC 5.1 04/28/2024   HGB 8.9 (L) 04/28/2024   HCT 25.9 (L) 04/28/2024   MCV 91.2 04/28/2024   PLT 172 04/28/2024   Most recent BMP    Latest Ref Rng & Units 04/28/2024    4:34 AM  BMP  Glucose 70 - 99 mg/dL 845   BUN 8 - 23 mg/dL 53   Creatinine 9.55 - 1.00 mg/dL 3.05   Sodium 864 - 854 mmol/L 138   Potassium 3.5 - 5.1 mmol/L 4.2   Chloride 98 - 111 mmol/L 107  CO2 22 - 32 mmol/L 20   Calcium  8.9 - 10.3 mg/dL 9.2     Imaging/Diagnostic Tests: No new imaging.   Kayla Raguel MATSU, DO 04/28/2024, 7:48 AM  PGY-1, [facility] FPTS Intern pager: [phone], text pages welcome Secure chat group [facility] Teaching Service

## 2024-04-28 NOTE — Assessment & Plan Note (Addendum)
 S/p T11-12 disc aspiration by IR on 9/7. EKG showed sinus bradycardia.  - Continue cefepime  1,000 mg daily, awaiting cultures   - Disc cx - no growth 2 days, gram negative rods   - Blood cx - no growth 3 days  - Appreciate neurosurgery recommendations  - ID consulted, appreciate recommendations  - Zofran  OTD 4 mg for nausea  - Pain management  - Tylenol  1,000 mg Q6H scheduled  - oxycodone  2.5 mg Q8H PRN moderate pain - oxycodone  5 mg Q8H PRN severe pain - Dilaudid  0.25 mg Q4H PRN for breakthrough pain  - PT/OT eval/treat - AM CBC

## 2024-04-28 NOTE — Progress Notes (Signed)
 Patient ID: Kayla Gregory, female   DOB: 1960-11-08, 63 y.o.   MRN: [id number] Is awake and alert Little complaints of pain Results still pending I have little to add at this point He is reconsult if necessary Follow-up for discitis can be as needed and on symptomatic basis

## 2024-04-29 ENCOUNTER — Other Ambulatory Visit (HOSPITAL_COMMUNITY): Payer: Self-pay

## 2024-04-29 DIAGNOSIS — M4644 Discitis, unspecified, thoracic region: Secondary | ICD-10-CM | POA: Diagnosis not present

## 2024-04-29 LAB — BASIC METABOLIC PANEL WITH GFR
Anion gap: 13 (ref 5–15)
BUN: 51 mg/dL — ABNORMAL HIGH (ref 8–23)
CO2: 21 mmol/L — ABNORMAL LOW (ref 22–32)
Calcium: 9.3 mg/dL (ref 8.9–10.3)
Chloride: 104 mmol/L (ref 98–111)
Creatinine, Ser: 6.69 mg/dL — ABNORMAL HIGH (ref 0.44–1.00)
GFR, Estimated: 6 mL/min — ABNORMAL LOW (ref >60)
Glucose, Bld: 155 mg/dL — ABNORMAL HIGH (ref 70–99)
Potassium: 4.6 mmol/L (ref 3.5–5.1)
Sodium: 138 mmol/L (ref 135–145)

## 2024-04-29 LAB — CBC
HCT: 25.7 % — ABNORMAL LOW (ref 36.0–46.0)
Hemoglobin: 8.6 g/dL — ABNORMAL LOW (ref 12.0–15.0)
MCH: 31 pg (ref 26.0–34.0)
MCHC: 33.5 g/dL (ref 30.0–36.0)
MCV: 92.8 fL (ref 80.0–100.0)
Platelets: 162 K/uL (ref 150–400)
RBC: 2.77 MIL/uL — ABNORMAL LOW (ref 3.87–5.11)
RDW: 12.5 % (ref 11.5–15.5)
WBC: 5.2 K/uL (ref 4.0–10.5)
nRBC: 0 % (ref 0.0–0.2)

## 2024-04-29 LAB — CULTURE, BLOOD (ROUTINE X 2)
Culture: NO GROWTH
Culture: NO GROWTH

## 2024-04-29 LAB — GLUCOSE, CAPILLARY
Glucose-Capillary: 159 mg/dL — ABNORMAL HIGH (ref 70–99)
Glucose-Capillary: 235 mg/dL — ABNORMAL HIGH (ref 70–99)

## 2024-04-29 MED ORDER — ACETAMINOPHEN 500 MG PO TABS: 1000.0000 mg | ORAL_TABLET | Freq: Four times a day (QID) | ORAL | Status: AC

## 2024-04-29 MED ORDER — CIPROFLOXACIN HCL 500 MG PO TABS
500.0000 mg | ORAL_TABLET | Freq: Every day | ORAL | 0 refills | Status: DC
  Filled 2024-04-29: qty 50, 50d supply, fill #0

## 2024-04-29 MED ORDER — ONDANSETRON 4 MG PO TBDP
4.0000 mg | ORAL_TABLET | Freq: Three times a day (TID) | ORAL | 0 refills | Status: DC | PRN
  Filled 2024-04-29: qty 20, 7d supply, fill #0

## 2024-04-29 MED ORDER — CALCIUM POLYCARBOPHIL 625 MG PO TABS: 625.0000 mg | ORAL_TABLET | Freq: Every day | ORAL | Status: AC

## 2024-04-29 MED ORDER — OXYCODONE HCL 5 MG PO TABS
5.0000 mg | ORAL_TABLET | Freq: Four times a day (QID) | ORAL | 0 refills | Status: DC | PRN
  Filled 2024-04-29: qty 30, 7d supply, fill #0

## 2024-04-29 NOTE — Assessment & Plan Note (Deleted)
 Creatinine 6.69 this morning with GFR 6.  Baseline creatinine 5.0-5.5. Patient follows with nephrology at [facility], is being considered for renal transplant.  Nephrology consulted. - Appreciate nephrology recommendations - Avoid nephrotoxic agents - AM BMP

## 2024-04-29 NOTE — Plan of Care (Signed)

## 2024-04-29 NOTE — Discharge Summary (Addendum)
 Family Medicine Teaching [facility] Discharge Summary  Patient name: Kayla Gregory Medical record number: [id number] Date of birth: 01/25/1961 Age: 63 y.o. Gender: female Date of Admission: 04/23/2024  Date of Discharge: 04/29/2024 Admitting Physician: Kathrine Melena, DO  Primary Care Provider: Cleotilde Perkins, DO Consultants: Neurosurgery, IR, Nephrology, ID   Indication for Hospitalization: lower back pain  Discharge Diagnoses/Problem List:  Principal Problem for Admission: Thoracic Discitis  Other Problems addressed during stay:  Principal Problem:   Discitis of thoracic region Active Problems:   CKD (chronic kidney disease) stage 5, GFR less than 15 ml/min [facility])   Chronic health problem   Brief Hospital Course:  ALTAIR STANKO is a 63 y.o.female with a history of HTN, T2DM, HLD, ESRD who was admitted to the [facility] Teaching Service at [facility] for worsening right flank pain likely secondary to chronic discitis and with worsening kidney function. Her hospital course is detailed below:  Right Flank Pain Ct Renal stone and MRI thoracic demonstrated paraspinal phlegmon, progressive disc collapse, and severe stenosis T11-12. Patient received IV pain medications, initially required Narcan  for apnea, recovered well. NSGY was consulted who recommended IR aspiration. IV cefepime  was started. Gram stain showed rare gram negative rods. ID was consulted and cefepime  was switched to ciprofloxacin ; less concern for TB to spine but she did have +quant gold, so AFB cultures were ordered but not obtained by discharge. Patient was sent home with ciprofloxacin  for a total of 8 weeks. Aspiration culture showed no growth at 4 days. Blood cultures showed no growth at 5 days. Pain had improved by discharge.  Other chronic conditions were medically managed with home medications and formulary alternatives as necessary.   PCP Follow-up Recommendations: Ensure completion of 8 weeks of Ciprofloxacin   (9/10 - 11/5) and follow up with MRI thoracic spine w/o contrast afterwards.  Assess right flank pain improvement Follow up for discitis with NSGY as needed per Dr. Colon   Results/Tests Pending at Time of Discharge:  Unresulted Labs (From admission, onward)     Start     Ordered   04/28/24 0930  Acid Fast Smear (AFB)  (AFB smear + Culture w reflexed sensitivities with precautions panel)  Add-on,   AD       Question:  Patient immune status  Answer:  Normal  Placed in And Linked Group   04/28/24 0929   04/28/24 0930  Acid Fast Culture with reflexed sensitivities  (AFB smear + Culture w reflexed sensitivities with precautions panel)  Add-on,   AD       Comments: Can we add on AFB to the thoracic aspiration done on 04/25/2024   Question:  Patient immune status  Answer:  Normal  Placed in And Linked Group   04/28/24 0929             Disposition: Home  Discharge Condition: Medically stable for discharge  Discharge Exam:  Vitals:   04/29/24 0810 04/29/24 1118  BP: (!) 171/60 (!) 118/58  Pulse: (!) 47 (!) 57  Resp: 18 18  Temp: 97.8 F (36.6 C) 97.6 F (36.4 C)  SpO2: 99% 97%   General: NAD Cardiology: RRR, no M/R/G Respiratory: CTAB, normal work of breathing on room air Abdomen: Soft, nondistended, nontender to palpation MSK: Right side of thoracic and lumbar spine tender to palpation, with greatest area of tenderness around site of T11-12 aspiration, hypertonic paraspinal muscles on right side Extremities: Warm, dry, no edema  Significant Procedures: IR thoracic disc aspiration of T11-12,  with aspiration cultures (04/25/2024)  Significant Labs and Imaging:  Recent Labs  Lab 04/28/24 0434 04/29/24 0159  WBC 5.1 5.2  HGB 8.9* 8.6*  HCT 25.9* 25.7*  PLT 172 162   Recent Labs  Lab 04/28/24 0434 04/29/24 0159  NA 138 138  K 4.2 4.6  CL 107 104  CO2 20* 21*  GLUCOSE 154* 155*  BUN 53* 51*  CREATININE 6.94* 6.69*  CALCIUM  9.2 9.3    Pertinent Imaging     CT Renal Stone Study 1. Progressive T10-11 disc collapse with interval increased lytic/cystic excavation of the opposing inferior T10 and superior T11 vertebral bodies, with increased reactive change in the adjacent paraspinal fat concerning for paraspinal phlegmon. Findings are worrisome for progressive spondylodiscitis and erosive changes in the peridiscal bone. MRI without and with contrast is recommended at this time. 2. The T11 lytic process now may also communicate with a peridiscal Schmorl's node in the inferior aspect of T11, therefore involvement of the T11-12 disc is not excluded. 3. No urolithiasis or hydronephrosis currently or previously. 4. Constipation and diverticulosis. 5. Aortic and coronary artery atherosclerosis. 6. Chronic moderate fold thickening in the proximal to mid stomach, probably chronic gastritis. 7. 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  MR Thoracic Spine w/o Contrast  1. At T11-T12 there is collapse of the disc space with endplate erosive changes, focal kyphosis and T11-T12 marrow edema. Findings could be secondary to chronic discitis/osteomyelitis or severe inflammatory degenerative changes. 2. Associated bony retropulsion results in moderate to severe canal stenosis at T11-T12. 3. Mild surrounding T11-T12 paraspinal edema. No visible drainable paraspinal or epidural fluid collection. Postcontrast imaging could provide more sensitive evaluation.   Discharge Medications:  Allergies as of 04/29/2024       Reactions   Metformin  And Related Diarrhea, Other (See Comments)   GI symptoms including abdominal pain and diarrhea - Pt not interested in taking again   Norvasc  [amlodipine ] Itching   Omeprazole  Rash, Dermatitis, Other (See Comments)   Denies airway involvement        Medication List     PAUSE taking these medications    cyclobenzaprine  10 MG tablet Wait to take this until your doctor or other care provider  tells you to start again. Commonly known as: FLEXERIL  Take 1 tablet (10 mg total) by mouth at bedtime.   diphenhydrAMINE  25 MG tablet Wait to take this until your doctor or other care provider tells you to start again. Commonly known as: BENADRYL  Take 25 mg by mouth every 4 (four) hours as needed.   gabapentin  100 MG capsule Wait to take this until your doctor or other care provider tells you to start again. Commonly known as: NEURONTIN  TAKE 1 CAPSULE(100 MG) BY MOUTH AT BEDTIME AS NEEDED   meloxicam  15 MG tablet Wait to take this until your doctor or other care provider tells you to start again. Commonly known as: MOBIC  TAKE 1 TABLET(15 MG) BY MOUTH DAILY       TAKE these medications    Accu-Chek Aviva Plus test strip Generic drug: glucose blood Use to check blood glucose up to four times daily   Accu-Chek Aviva Plus w/Device Kit 1 application  by Does not apply route 4 (four) times daily -  with meals and at bedtime.   accu-chek softclix lancets Use as instructed   Lancet Device Misc Check blood glucose once a day in the AM.   Accu-Chek Softclix Lancets lancets Use as instructed   acetaminophen   500 MG tablet Commonly known as: TYLENOL  Take 2 tablets (1,000 mg total) by mouth every 6 (six) hours.   aspirin  EC 81 MG tablet Take 81 mg by mouth daily. Swallow whole.   atorvastatin  40 MG tablet Commonly known as: LIPITOR TAKE 1 TABLET(40 MG) BY MOUTH DAILY   B-D ULTRAFINE III SHORT PEN 31G X 8 MM Misc Generic drug: Insulin  Pen Needle USE DAILY AS DIRECTED   benztropine  1 MG tablet Commonly known as: COGENTIN  Take by mouth 2 (two) times daily. Per pt take 4 tabs in am and 1 tab at bedtime   Blood Pressure Cuff Misc 1 each by Does not apply route daily.   carboxymethylcellulose 0.5 % Soln Commonly known as: REFRESH PLUS Place 1 drop into both eyes 3 (three) times daily as needed.   cetirizine  10 MG tablet Commonly known as: ZYRTEC  Take 1 tablet (10 mg  total) by mouth daily as needed for allergies.   ciprofloxacin  500 MG tablet Commonly known as: CIPRO  Take 1 tablet (500 mg total) by mouth daily with supper. Start taking on: April 30, 2024   cloNIDine  0.1 MG tablet Commonly known as: CATAPRES  Take 0.1 mg by mouth 2 (two) times daily.   diclofenac  Sodium 1 % Gel Commonly known as: Voltaren  Arthritis Pain Apply 2 g topically 4 (four) times daily.   FLUoxetine  10 MG capsule Commonly known as: PROZAC  Take 10 mg by mouth daily.   Lantus  SoloStar 100 UNIT/ML Solostar Pen Generic drug: insulin  glargine Inject 10 Units into the skin daily.   lidocaine  5 % Commonly known as: Lidoderm  Place 1 patch onto the skin daily. Remove & Discard patch within 12 hours or as directed by MD   ondansetron  4 MG disintegrating tablet Commonly known as: ZOFRAN -ODT Dissolve 1 tablet (4 mg total) by mouth every 8 (eight) hours as needed for nausea or vomiting.   oxyCODONE  5 MG immediate release tablet Commonly known as: Oxy IR/ROXICODONE  Take 1 tablet (5 mg total) by mouth every 6 (six) hours as needed for severe pain (pain score 7-10).   pantoprazole  40 MG tablet Commonly known as: PROTONIX  Take 1 tablet (40 mg total) by mouth daily.   polycarbophil 625 MG tablet Commonly known as: FIBERCON Take 1 tablet (625 mg total) by mouth daily. Start taking on: April 30, 2024   polyethylene glycol 17 g packet Commonly known as: MiraLax  Take 17 g by mouth daily.   senna-docusate 8.6-50 MG tablet Commonly known as: Senokot-S Take 1 tablet by mouth at bedtime as needed for mild constipation or moderate constipation.   traZODone 50 MG tablet Commonly known as: DESYREL Take 50 mg by mouth at bedtime.   triamcinolone  cream 0.1 % Commonly known as: KENALOG  Apply 1 Application topically 2 (two) times daily.   valsartan -hydrochlorothiazide  160-25 MG tablet Commonly known as: DIOVAN -HCT TAKE 1 TABLET BY MOUTH DAILY   ziprasidone  20 MG  capsule Commonly known as: GEODON  Take 20 mg by mouth daily with breakfast.        Discharge Instructions: Please refer to Patient Instructions section of EMR for full details.  Patient was counseled important signs and symptoms that should prompt return to medical care, changes in medications, dietary instructions, activity restrictions, and follow up appointments.   Follow-Up Appointments: 05/10/2024 - Family Medicine, Dr. Cleotilde  05/20/2024 - Infectious Disease, Dr. Dennise Lee, Raguel MATSU, DO 04/29/2024, 4:48 PM PGY-1, [facility]   I agree with the assessment and plan as documented above.  Stuart Redo, MD PGY-3, [facility]

## 2024-04-29 NOTE — Assessment & Plan Note (Deleted)
 HTN - hold home valsartan -hydrochlorothiazide  due to elevated Cr, home Clonidine  0.1 mg BID T2DM - very sensitive SSI, may restart home Semglee  10 units as needed HLD - continue home lipitor 40 daily Schizophrenia - Restart ziprasidone  today as patient is no longer NPO. Continue benztropine .  (Patient is refusing d/t wanting to be awake/with it while here, will schedule for evening) GERD - continue home protonix  40 daily  Depression - continue home fluoxetine  10 daily History of stable angina - restart home ASA 81 mg

## 2024-04-29 NOTE — Progress Notes (Addendum)
 DISCHARGE NOTE HOME Kayla Gregory to be discharged Home per MD order. Discussed prescriptions and follow up appointments with the patient. Prescriptions given to patient; medication list explained in detail. Patient verbalized understanding.TOC meds given to patient  Skin clean, dry and intact without evidence of skin break down, no evidence of skin tears noted. IV catheter discontinued intact. Site without signs and symptoms of complications. Dressing and pressure applied. Pt denies pain at the site currently. No complaints noted.  Patient free of lines, drains, and wounds.   An After Visit Summary (AVS) was printed and given to the patient. Patient escorted via wheelchair,  todischarge lounge to wait for ride and medications.  [name], RN

## 2024-04-29 NOTE — Progress Notes (Signed)
 Physical Therapy Brief Evaluation and Discharge Note Patient Details Name: Kayla Gregory MRN: [id number] DOB: 09/17/1960 Today's Date: 04/29/2024   History of Present Illness  63 y.o. female [facility] to [facility]23[facility] with R flank pain x2 weeks. 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 9/7 T11-T12 disc aspiration, gram stain showed GNR. PMHx:  ESRD not yet on HD, diabetes, hypertension   Clinical Impression  Pt in bed upon arrival and agreeable to PT eval. PTA, pt was independent for mobility with either no AD or SP cane if having increased L hip pain. Pt presents at functional mobility baseline with ability to ambulate in the room independently with no AD. Pt declined further need for acute or post-acute PT. Pt has 24/7 level of assist upon d/c home. Pt has no further questions or concerns with acute PT signing off. Please re-consult if there are any changes in status.      PT Assessment Patient does not need any further PT services  Assistance Needed at Discharge  None    Equipment Recommendations None recommended by PT     Precautions/Restrictions Precautions Precautions: None Restrictions Weight Bearing Restrictions Per Provider Order: No        Mobility  Bed Mobility   Supine/Sidelying to sit: Independent Sit to supine/sidelying: Independent    Transfers Overall transfer level: Independent Equipment used: None     Ambulation/Gait Ambulation/Gait assistance: Independent Gait Distance (Feet): 30 Feet Assistive device: None Gait Pattern/deviations: WFL(Within Functional Limits) Gait Speed: Pace WFL General Gait Details: steady with no AD     Balance Overall balance assessment: Independent      Pertinent Vitals/Pain PT - Brief Vital Signs All Vital Signs Stable: Yes Pain Assessment Pain Assessment: Faces Faces Pain Scale: Hurts a little bit Pain Location: low back Pain Descriptors / Indicators: Discomfort Pain Intervention(s):  Limited activity within patient's tolerance, Monitored during session, Repositioned     Home Living Family/patient expects to be discharged to:: Private residence Living Arrangements: Other (Comment) (nephew) Available Help at Discharge: Family;Available 24 hours/day Home Environment: Level entry   Home Equipment: Cane - single point        Prior Function Level of Independence: Independent with assistive device(s) Comments: occasionally used SP cane if L hip was painful    UE/LE Assessment   UE ROM/Strength/Tone/Coordination: WFL    LE ROM/Strength/Tone/Coordination: [facility]      Communication   Communication Communication: No apparent difficulties     Cognition Overall Cognitive Status: Appears within functional limits for tasks assessed/performed        PT Visit Diagnosis Other abnormalities of gait and mobility (R26.89)    No Skilled PT All education completed;Patient at baseline level of functioning;Patient is independent with all acitivity/mobility    AMPAC 6 Clicks Help needed turning from your back to your side while in a flat bed without using bedrails?: None Help needed moving from lying on your back to sitting on the side of a flat bed without using bedrails?: None Help needed moving to and from a bed to a chair (including a wheelchair)?: None Help needed standing up from a chair using your arms (e.g., wheelchair or bedside chair)?: None Help needed to walk in hospital room?: None Help needed climbing 3-5 steps with a railing? : None 6 Click Score: 24      End of Session   Activity Tolerance: Patient tolerated treatment well Patient left: in bed;with call bell/phone within reach Nurse Communication: Mobility status PT Visit Diagnosis: Other  abnormalities of gait and mobility (R26.89)     Time: 9184-9174 PT Time Calculation (min) (ACUTE ONLY): 10 min  Charges:   PT Evaluation $PT Eval Low Complexity: 1 Low     Kate ORN, PT, DPT Secure Chat  Preferred  Rehab Office [phone]   Kate BRAVO Wendolyn  04/29/2024, 8:29 AM

## 2024-04-29 NOTE — Progress Notes (Signed)
 OT Screen Note  Patient Details Name: Kayla Gregory MRN: [id number] DOB: [birth year]   Cancelled Treatment:    Reason Eval/Treat Not Completed: OT screened, no needs identified, will sign off (Pt ambulating ind with PT, she reports being able to complete ADLs without challenge and pain being under control. Pt has no acute skilled OT needs at this time, currently near baseline.)  04/29/2024  AB, OTR/L  Acute Rehabilitation Services  Office: [phone]  Kayla Gregory 04/29/2024, 10:28 AM

## 2024-04-29 NOTE — Assessment & Plan Note (Deleted)
 S/p T11-12 disc aspiration by IR on 9/7. EKG showed sinus bradycardia.  - Continue cefepime  1,000 mg daily, awaiting cultures   - Disc cx - no growth 2 days, gram negative rods   - Blood cx - no growth 3 days  - Appreciate neurosurgery recommendations  - ID consulted, appreciate recommendations   - 8 weeks antibiotics with repeat MRI at end of treatment   - D/C'd cefepime  and started ciprofloxacin  500 mg daily 9/10  - Zofran  OTD 4 mg Q8H PRN - Pain management  - Tylenol  1,000 mg Q6H scheduled  - oxycodone  2.5 mg Q8H PRN moderate pain - oxycodone  5 mg Q8H PRN severe pain - Dilaudid  0.25 mg Q4H PRN for breakthrough pain  - PT/OT eval/treat - AM CBC

## 2024-04-30 LAB — AEROBIC/ANAEROBIC CULTURE W GRAM STAIN (SURGICAL/DEEP WOUND)
Culture: NO GROWTH
Gram Stain: NONE SEEN

## 2024-05-07 ENCOUNTER — Telehealth: Payer: Self-pay

## 2024-05-07 NOTE — Telephone Encounter (Signed)
 Patient calls nurse line reporting facial swelling.   She reports she woke up this morning and she noticed her face was swollen. She reports more so under my eyes. She reports puffiness. She reports her hands appear swollen as well.   She denies any shortness of breath, chest pains, dizziness or vision changes.   She reports she is starting to feel better, she reports she didn't have her air conditioning on. She reports the house is gradually starting to cool off.   She has an apt on 9/22.   ED precautions discussed over the weekend.

## 2024-05-08 ENCOUNTER — Emergency Department (HOSPITAL_COMMUNITY)
Admission: EM | Admit: 2024-05-08 | Discharge: 2024-05-08 | Disposition: A | Discharge status: Home / Self Care | Attending: Emergency Medicine | Admitting: Emergency Medicine

## 2024-05-08 ENCOUNTER — Emergency Department (HOSPITAL_COMMUNITY)

## 2024-05-08 ENCOUNTER — Encounter (HOSPITAL_COMMUNITY): Payer: Self-pay | Admitting: Emergency Medicine

## 2024-05-08 ENCOUNTER — Other Ambulatory Visit: Payer: Self-pay

## 2024-05-08 DIAGNOSIS — Z79899 Other long term (current) drug therapy: Secondary | ICD-10-CM | POA: Insufficient documentation

## 2024-05-08 DIAGNOSIS — N185 Chronic kidney disease, stage 5: Secondary | ICD-10-CM | POA: Diagnosis not present

## 2024-05-08 DIAGNOSIS — E1122 Type 2 diabetes mellitus with diabetic chronic kidney disease: Secondary | ICD-10-CM | POA: Insufficient documentation

## 2024-05-08 DIAGNOSIS — I120 Hypertensive chronic kidney disease with stage 5 chronic kidney disease or end stage renal disease: Secondary | ICD-10-CM | POA: Insufficient documentation

## 2024-05-08 DIAGNOSIS — K5900 Constipation, unspecified: Secondary | ICD-10-CM | POA: Diagnosis not present

## 2024-05-08 DIAGNOSIS — Z7982 Long term (current) use of aspirin: Secondary | ICD-10-CM | POA: Diagnosis not present

## 2024-05-08 DIAGNOSIS — R1031 Right lower quadrant pain: Secondary | ICD-10-CM | POA: Diagnosis present

## 2024-05-08 LAB — COMPREHENSIVE METABOLIC PANEL WITH GFR
ALT: 37 U/L (ref 0–44)
AST: 39 U/L (ref 15–41)
Albumin: 3.3 g/dL — ABNORMAL LOW (ref 3.5–5.0)
Alkaline Phosphatase: 96 U/L (ref 38–126)
Anion gap: 10 (ref 5–15)
BUN: 60 mg/dL — ABNORMAL HIGH (ref 8–23)
CO2: 22 mmol/L (ref 22–32)
Calcium: 8.7 mg/dL — ABNORMAL LOW (ref 8.9–10.3)
Chloride: 103 mmol/L (ref 98–111)
Creatinine, Ser: 7.75 mg/dL — ABNORMAL HIGH (ref 0.44–1.00)
GFR, Estimated: 5 mL/min — ABNORMAL LOW (ref >60)
Glucose, Bld: 173 mg/dL — ABNORMAL HIGH (ref 70–99)
Potassium: 4.6 mmol/L (ref 3.5–5.1)
Sodium: 135 mmol/L (ref 135–145)
Total Bilirubin: 0.5 mg/dL (ref 0.0–1.2)
Total Protein: 6.7 g/dL (ref 6.5–8.1)

## 2024-05-08 LAB — CBC
HCT: 25.7 % — ABNORMAL LOW (ref 36.0–46.0)
Hemoglobin: 8.6 g/dL — ABNORMAL LOW (ref 12.0–15.0)
MCH: 31.5 pg (ref 26.0–34.0)
MCHC: 33.5 g/dL (ref 30.0–36.0)
MCV: 94.1 fL (ref 80.0–100.0)
Platelets: 201 K/uL (ref 150–400)
RBC: 2.73 MIL/uL — ABNORMAL LOW (ref 3.87–5.11)
RDW: 12.9 % (ref 11.5–15.5)
WBC: 5.5 K/uL (ref 4.0–10.5)
nRBC: 0 % (ref 0.0–0.2)

## 2024-05-08 LAB — LIPASE, BLOOD: Lipase: 78 U/L — ABNORMAL HIGH (ref 11–51)

## 2024-05-08 MED ORDER — POLYETHYLENE GLYCOL 3350 17 GM/SCOOP PO POWD: 17.0000 g | Freq: Every day | ORAL | 0 refills | Status: DC

## 2024-05-08 MED ORDER — SENNOSIDES-DOCUSATE SODIUM 8.6-50 MG PO TABS
2.0000 | ORAL_TABLET | Freq: Every day | ORAL | 0 refills | Status: AC
Start: 2024-05-08 — End: 2024-05-18

## 2024-05-08 MED ORDER — SODIUM CHLORIDE 0.9 % IV BOLUS
500.0000 mL | Freq: Once | INTRAVENOUS | Status: AC
  Administered 2024-05-08: 500 mL via INTRAVENOUS

## 2024-05-08 NOTE — ED Triage Notes (Signed)
 Pt arrives via EMS from home with reports of constipation, no BM in 3 weeks. Pt discharged last week and states they never addressed her constipation. Pt upset about being at [facility] and wanted [employer].

## 2024-05-08 NOTE — Discharge Instructions (Addendum)
 Please take your medication as prescribed, for the next week and follow-up with your physician in the coming days if you have not noticed an improvement in your bowel movements.  Return here for concerning changes in your condition.

## 2024-05-08 NOTE — ED Provider Notes (Signed)
 [place] EMERGENCY DEPARTMENT AT [facility] Provider Note   CSN: [id number] Arrival date & time: 05/08/24  1226     Patient presents with: Constipation   Kayla Gregory is a 63 y.o. female.   HPI Patient presents with concern of constipation, right lower quadrant abdominal pain. She has multiple medical problems including CKD, stage V as well as diabetes, hypertension and was admitted here 1 week ago, discharged with ongoing antibiotics for discitis which had already required IR drainage. She has ongoing mild back discomfort, unchanged, but since going home has not had a bowel movement, and has new right lower quadrant abdominal pain. There is anorexia, nausea, but no vomiting. She has not seen her physician, nor taken any medication for constipation.     Prior to Admission medications   Medication Sig Start Date End Date Taking? Authorizing Provider  polyethylene glycol powder (MIRALAX ) 17 GM/SCOOP powder Take 17 g by mouth daily. Dissolve 1 capful (17g) in 4-8 ounces of liquid and take by mouth daily. 05/08/24  Yes Garrick Charleston, MD  senna-docusate (SENOKOT-S) 8.6-50 MG tablet Take 2 tablets by mouth daily for 10 days. 05/08/24 05/18/24 Yes Garrick Charleston, MD  Accu-Chek Softclix Lancets lancets Use as instructed 11/03/23   Cleotilde Perkins, DO  acetaminophen  (TYLENOL ) 500 MG tablet Take 2 tablets (1,000 mg total) by mouth every 6 (six) hours. 04/29/24   Tharon Lung, MD  aspirin  EC 81 MG tablet Take 81 mg by mouth daily. Swallow whole.    [provider]  atorvastatin  (LIPITOR) 40 MG tablet TAKE 1 TABLET(40 MG) BY MOUTH DAILY 03/08/24   Tharon Lung, MD  benztropine  (COGENTIN ) 1 MG tablet Take by mouth 2 (two) times daily. Per pt take 4 tabs in am and 1 tab at bedtime    [provider]  Blood Glucose Monitoring Suppl (ACCU-CHEK AVIVA PLUS) w/Device KIT 1 application  by Does not apply route 4 (four) times daily -  with meals and at bedtime. 11/03/23    Cleotilde Perkins, DO  Blood Pressure Monitoring (BLOOD PRESSURE CUFF) MISC 1 each by Does not apply route daily. 07/29/22   Cleotilde Perkins, DO  carboxymethylcellulose (REFRESH PLUS) 0.5 % SOLN Place 1 drop into both eyes 3 (three) times daily as needed.    [provider]  cetirizine  (ZYRTEC ) 10 MG tablet Take 1 tablet (10 mg total) by mouth daily as needed for allergies. 05/21/23   Cleotilde Perkins, DO  ciprofloxacin  (CIPRO ) 500 MG tablet Take 1 tablet (500 mg total) by mouth daily with supper. 04/30/24 06/19/24  Tharon Lung, MD  cloNIDine  (CATAPRES ) 0.1 MG tablet Take 0.1 mg by mouth 2 (two) times daily.    [provider]  cyclobenzaprine  (FLEXERIL ) 10 MG tablet Take 1 tablet (10 mg total) by mouth at bedtime. 03/22/24   Janit Thresa HERO, DPM  diclofenac  Sodium (VOLTAREN  ARTHRITIS PAIN) 1 % GEL Apply 2 g topically 4 (four) times daily. 01/24/24   Johnie Flaming A, NP  diphenhydrAMINE  (BENADRYL ) 25 MG tablet Take 25 mg by mouth every 4 (four) hours as needed.    [provider]  FLUoxetine  (PROZAC ) 10 MG capsule Take 10 mg by mouth daily.    [provider]  gabapentin  (NEURONTIN ) 100 MG capsule TAKE 1 CAPSULE(100 MG) BY MOUTH AT BEDTIME AS NEEDED 04/09/24   Cleotilde Perkins, DO  glucose blood (ACCU-CHEK AVIVA PLUS) test strip Use to check blood glucose up to four times daily 11/03/23   Cleotilde Perkins, DO  insulin   glargine (LANTUS  SOLOSTAR) 100 UNIT/ML Solostar Pen Inject 10 Units into the skin daily. 11/03/23   Cleotilde Perkins, DO  Insulin  Pen Needle (B-D ULTRAFINE III SHORT PEN) 31G X 8 MM MISC USE DAILY AS DIRECTED 11/03/23   Cleotilde Perkins, DO  Lancet Device MISC Check blood glucose once a day in the AM. 04/13/20   Chandra Toribio POUR, MD  Lancet Devices [facility]) lancets Use as instructed 11/22/16   Luba Gurney, MD  lidocaine  (LIDODERM ) 5 % Place 1 patch onto the skin daily. Remove & Discard patch within 12 hours or as directed by MD Patient not taking: Reported on  04/24/2024 01/24/24   Johnie Flaming A, NP  meloxicam  (MOBIC ) 15 MG tablet TAKE 1 TABLET(15 MG) BY MOUTH DAILY 03/10/24   Janit Thresa HERO, DPM  ondansetron  (ZOFRAN -ODT) 4 MG disintegrating tablet Dissolve 1 tablet (4 mg total) by mouth every 8 (eight) hours as needed for nausea or vomiting. 04/29/24   Tharon Lung, MD  oxyCODONE  (OXY IR/ROXICODONE ) 5 MG immediate release tablet Take 1 tablet (5 mg total) by mouth every 6 (six) hours as needed for severe pain (pain score 7-10). 04/29/24   Tharon Lung, MD  pantoprazole  (PROTONIX ) 40 MG tablet Take 1 tablet (40 mg total) by mouth daily. 10/01/23   Cleotilde Perkins, DO  polycarbophil (FIBERCON) 625 MG tablet Take 1 tablet (625 mg total) by mouth daily. 04/30/24   Tharon Lung, MD  polyethylene glycol (MIRALAX ) 17 g packet Take 17 g by mouth daily. 01/12/24   Long, Fonda MATSU, MD  senna-docusate (SENOKOT-S) 8.6-50 MG tablet Take 1 tablet by mouth at bedtime as needed for mild constipation or moderate constipation. 01/12/24   Long, Joshua G, MD  traZODone (DESYREL) 50 MG tablet Take 50 mg by mouth at bedtime.    [provider]  triamcinolone  cream (KENALOG ) 0.1 % Apply 1 Application topically 2 (two) times daily. 04/15/23   Cleotilde Perkins, DO  valsartan -hydrochlorothiazide  (DIOVAN -HCT) 160-25 MG tablet TAKE 1 TABLET BY MOUTH DAILY 04/12/24   Cleotilde Perkins, DO  ziprasidone  (GEODON ) 20 MG capsule Take 20 mg by mouth daily with breakfast.    [provider]    Allergies: Metformin  and related, Norvasc  [amlodipine ], and Omeprazole     Review of Systems  Updated Vital Signs BP (!) 129/44 (BP Location: Left Arm)   Pulse (!) 53   Temp 97.6 F (36.4 C)   Resp 16   Ht 1.549 m (5' 1)   Wt 63.8 kg   SpO2 98%   BMI 26.58 kg/m   Physical Exam Vitals and nursing note reviewed.  Constitutional:      General: She is not in acute distress.    Appearance: She is well-developed.  HENT:     Head: Normocephalic and atraumatic.  Eyes:      Conjunctiva/sclera: Conjunctivae normal.  Cardiovascular:     Rate and Rhythm: Normal rate and regular rhythm.  Pulmonary:     Effort: Pulmonary effort is normal. No respiratory distress.     Breath sounds: Normal breath sounds. No stridor.  Abdominal:     General: There is no distension.     Tenderness: There is abdominal tenderness.  Skin:    General: Skin is warm and dry.     Comments: Posterior skin essentially unremarkable  Neurological:     Mental Status: She is alert and oriented to person, place, and time.     Cranial Nerves: No cranial nerve deficit.  Psychiatric:  Mood and Affect: Mood normal.     (all labs ordered are listed, but only abnormal results are displayed) Labs Reviewed  LIPASE, BLOOD - Abnormal; Notable for the following components:      Result Value   Lipase 78 (*)    All other components within normal limits  COMPREHENSIVE METABOLIC PANEL WITH GFR - Abnormal; Notable for the following components:   Glucose, Bld 173 (*)    BUN 60 (*)    Creatinine, Ser 7.75 (*)    Calcium  8.7 (*)    Albumin 3.3 (*)    GFR, Estimated 5 (*)    All other components within normal limits  CBC - Abnormal; Notable for the following components:   RBC 2.73 (*)    Hemoglobin 8.6 (*)    HCT 25.7 (*)    All other components within normal limits  MAGNESIUM    EKG: None  Radiology: CT ABDOMEN PELVIS WO CONTRAST Result Date: 05/08/2024 CLINICAL DATA:  Right lower quadrant pain.  Constipation. EXAM: CT ABDOMEN AND PELVIS WITHOUT CONTRAST TECHNIQUE: Multidetector CT imaging of the abdomen and pelvis was performed following the standard protocol without IV contrast. RADIATION DOSE REDUCTION: This exam was performed according to the departmental dose-optimization program which includes automated exposure control, adjustment of the mA and/or kV according to patient size and/or use of iterative reconstruction technique. COMPARISON:  04/23/2024 FINDINGS: Lower chest: Minimal  peripheral reticulation over the medial right base and lateral left base with mild associated bronchiectatic change over the medial right base as findings may be due to sequelae from previous inflammatory/infection. No acute findings. Calcified plaque over the descending thoracic aorta. Calcification over the right coronary and left lateral circumflex coronary arteries. Tiny amount of pericardial fluid. Hepatobiliary: Gallbladder, liver and biliary tree are normal. Pancreas: Normal. Spleen: Normal. Adrenals/Urinary Tract: Adrenal glands are normal. Kidneys are normal in size without hydronephrosis or nephrolithiasis. Two right renal cysts unchanged. No further imaging follow-up recommended. Ureters and bladder are normal. Stomach/Bowel: Stomach and small bowel are normal. Appendix is normal. Colon is unremarkable. There is moderate fecal retention throughout the colon. Vascular/Lymphatic: Mild calcified plaque over the abdominal aorta which is normal in caliber. Calcified plaque over the SMA. Remaining vascular structures are unremarkable. No evidence of adenopathy. Reproductive: Uterus and bilateral adnexa are unremarkable. Other: No free fluid or focal inflammatory change. Musculoskeletal: Left hip arthroplasty intact. Stable known suspected disc osteomyelitis at the T10-11 level. Subtle anterolisthesis of T10 on T11 unchanged. Mild anterolisthesis of L5 on S1 unchanged T12 superior endplate Schmorl's node unchanged. IMPRESSION: 1. No acute findings in the abdomen/pelvis. 2. Moderate fecal retention throughout the colon. 3. Stable known suspected disc osteomyelitis at the T10-11 level. 4. Aortic atherosclerosis. Atherosclerotic coronary artery disease. Aortic Atherosclerosis (ICD10-I70.0). Electronically Signed   By: Toribio Agreste M.D.   On: 05/08/2024 15:51     Procedures   Medications Ordered in the ED  sodium chloride  0.9 % bolus 500 mL (0 mLs Intravenous Stopped 05/08/24 1530)                                     Medical Decision Making Adult female presents with right lower quadrant abdominal pain, constipation in the context of recent admission, spine infection requiring IR drainage.  Broad differential including infection, constipation, appendicitis, dehydration, medication effects all considered. Cardiac 60 sinus normal pulse ox 98% room air normal  Amount and/or Complexity of Data  Reviewed External Data Reviewed: notes.    Details: Summary from hospitalization included below Labs: ordered. Decision-making details documented in ED Course. Radiology: ordered and independent interpretation performed. Decision-making details documented in ED Course.  Brief Hospital Course:  GWENEVERE GOGA is a 63 y.o.female with a history of HTN, T2DM, HLD, ESRD who was admitted to the [facility] Medicine Teaching Service at [facility] for worsening right flank pain likely secondary to chronic discitis and with worsening kidney function. Her hospital course is detailed below:   Right Flank Pain Ct Renal stone and MRI thoracic demonstrated paraspinal phlegmon, progressive disc collapse, and severe stenosis T11-12. Patient received IV pain medications, initially required Narcan  for apnea, recovered well. NSGY was consulted who recommended IR aspiration. IV cefepime  was started. Gram stain showed rare gram negative rods. ID was consulted and cefepime  was switched to ciprofloxacin ; less concern for TB to spine but she did have +quant gold, so AFB cultures were ordered but not obtained by discharge. Patient was sent home with ciprofloxacin  for a total of 8 weeks. Aspiration culture showed no growth at 4 days. Blood cultures showed no growth at 5 days. Pain had improved by discharge.   Other chronic conditions were medically managed with home medications and formulary alternatives as necessary.    PCP Follow-up Recommendations: Ensure completion of 8 weeks of Ciprofloxacin  (9/10 - 11/5) and follow up with MRI thoracic spine  w/o contrast afterwards.  Assess right flank pain improvement Follow up for discitis with NSGY as needed per Dr. Colon 4:12 PM CT similar to prior, patient awake, alert, ambulatory, labs similar to prior.  Had lengthy conversation about the CT results, need for initiation of bowel movement regimen and following up with her primary care team.  Without evidence for acute abdomen, obstruction, infection, patient discharged in stable condition.     Final diagnoses:  Constipation, unspecified constipation type    ED Discharge Orders          Ordered    polyethylene glycol powder (MIRALAX ) 17 GM/SCOOP powder  Daily        05/08/24 1612    senna-docusate (SENOKOT-S) 8.6-50 MG tablet  Daily        05/08/24 1612               Garrick Charleston, MD 05/08/24 1612

## 2024-05-10 ENCOUNTER — Inpatient Hospital Stay: Payer: Self-pay | Admitting: Student

## 2024-05-10 ENCOUNTER — Ambulatory Visit (INDEPENDENT_AMBULATORY_CARE_PROVIDER_SITE_OTHER): Admitting: Student

## 2024-05-10 ENCOUNTER — Other Ambulatory Visit: Payer: Self-pay | Admitting: Student

## 2024-05-10 VITALS — BP 158/65 | HR 51 | Ht 61.0 in | Wt 138.8 lb

## 2024-05-10 DIAGNOSIS — I10 Essential (primary) hypertension: Secondary | ICD-10-CM

## 2024-05-10 DIAGNOSIS — M4644 Discitis, unspecified, thoracic region: Secondary | ICD-10-CM

## 2024-05-10 DIAGNOSIS — K5903 Drug induced constipation: Secondary | ICD-10-CM

## 2024-05-10 DIAGNOSIS — N184 Chronic kidney disease, stage 4 (severe): Secondary | ICD-10-CM

## 2024-05-10 NOTE — Progress Notes (Signed)
    SUBJECTIVE:   CHIEF COMPLAINT / HPI:   Kayla Gregory is a 63 y.o. female presenting for hospital follow up.   I reviewed previous admission notes: admitted for discitis on 04/23/24. Discharged on 04/29/24 with plan to complete 8 weeks of Ciprofloxacin  ending 06/23/24. She will need follow up MRI after completing antibiotics.    I reviewed previous lab results: hospital course complicated by worsening renal function. At baseline has CKD5, avoiding dialysis and pursing renal transplant at [facility].  Cr at discharge 6.69 with follow up of 7.75.  Needs CMP today   Constipation:  Has not had a bowel movement in over 2 weeks per patient. She has passed some small hard stools a few days ago. Passing gas. Able to tolerate liquids but does not have much of an appetite.  Was evaluated at the ED on 9/20 and had a CT which showed no obstruction.  Has only tried Miralax  BID and dulcolax suppository   PERTINENT  PMH / PSH: reviewed and updated.  OBJECTIVE:   BP (!) 158/65   Pulse (!) 51   Ht 5' 1 (1.549 m)   Wt 138 lb 12.8 oz (63 kg)   SpO2 100%   BMI 26.23 kg/m   uncomfortable-appearing, no acute distress Cardio: Regular rate, regular rhythm, no murmurs on exam. Pulm: Clear, no wheezing, no crackles. No increased work of breathing Abdominal: bowel sounds present, soft, non-tender, non-distended Extremities: no peripheral edema    ASSESSMENT/PLAN:   Assessment & Plan CKD (chronic kidney disease), stage IV (HCC) Recheck CMP today  Patient has follow up with [facility] transplant in 3 months  Is not uremic on exam today  Continues to make urine  Discitis of thoracic region Afebrile today. Ensured she is taking her antibiotics as prescribed.  Has follow up with ID scheduled  Drug-induced constipation Appears to be drug induced constipation with oxycodone  prescription. She is no longer taking this medication.  Do not suspect bowel obstruction at this time. Benign abdominal exam and reassuring  CT AP on 9/20  Discussed home bowel clean out with OTC enema and Miralax .  Patient declined lactulose prescription today She will call if she has not had a bowel movement in the next 24-48 hours. May need bowel clean out inpatient.      Damien Pinal, DO  [facility]

## 2024-05-10 NOTE — Assessment & Plan Note (Signed)
 Recheck CMP today  Patient has follow up with [facility] transplant in 3 months  Is not uremic on exam today  Continues to make urine

## 2024-05-10 NOTE — Assessment & Plan Note (Addendum)
 Afebrile today. Ensured she is taking her antibiotics as prescribed.  Has follow up with ID scheduled

## 2024-05-10 NOTE — Patient Instructions (Addendum)
 It was great to see you today!   For the constipation:   Try an over the counter enema  After the enema take one capful of Miralax  every 30 minutes until you have a bowel movement.   Continue this until you have a regular bowel movement every day.    Follow up in 1-2 weeks for repeat lab monitoring.   Future Appointments  Date Time Provider Department Center  05/12/2024 10:30 AM Cleotilde Perkins, DO [facility]  05/20/2024  2:30 PM Dennise Kingsley, MD [facility]    Please arrive 15 minutes before your appointment to ensure smooth check in process.  If you are more than 15 minutes late, you may be asked to reschedule.   Please bring a list of your medications with you to all appointments.   Please call the clinic at [phone] if your symptoms worsen or you have any concerns.  Thank you for allowing me to participate in your care, Dr. Perkins Cleotilde [facility]

## 2024-05-11 ENCOUNTER — Ambulatory Visit: Payer: Self-pay | Admitting: Student

## 2024-05-11 LAB — COMPREHENSIVE METABOLIC PANEL WITH GFR
ALT: 32 IU/L (ref 0–32)
AST: 43 IU/L — ABNORMAL HIGH (ref 0–40)
Albumin: 4.1 g/dL (ref 3.9–4.9)
Alkaline Phosphatase: 114 IU/L (ref 49–135)
BUN/Creatinine Ratio: 8 — ABNORMAL LOW (ref 12–28)
BUN: 50 mg/dL — ABNORMAL HIGH (ref 8–27)
Bilirubin Total: 0.3 mg/dL (ref 0.0–1.2)
CO2: 19 mmol/L — ABNORMAL LOW (ref 20–29)
Calcium: 9.3 mg/dL (ref 8.7–10.3)
Chloride: 104 mmol/L (ref 96–106)
Creatinine, Ser: 6.6 mg/dL — ABNORMAL HIGH (ref 0.57–1.00)
Globulin, Total: 2.3 g/dL (ref 1.5–4.5)
Glucose: 92 mg/dL (ref 70–99)
Potassium: 4.6 mmol/L (ref 3.5–5.2)
Sodium: 141 mmol/L (ref 134–144)
Total Protein: 6.4 g/dL (ref 6.0–8.5)
eGFR: 6 mL/min/1.73 — ABNORMAL LOW (ref >59)

## 2024-05-12 ENCOUNTER — Emergency Department (HOSPITAL_COMMUNITY)
Admission: EM | Admit: 2024-05-12 | Discharge: 2024-05-12 | Disposition: A | Discharge status: Home / Self Care | Attending: Emergency Medicine | Admitting: Emergency Medicine

## 2024-05-12 ENCOUNTER — Other Ambulatory Visit: Payer: Self-pay | Admitting: Student

## 2024-05-12 ENCOUNTER — Other Ambulatory Visit: Payer: Self-pay

## 2024-05-12 ENCOUNTER — Emergency Department (HOSPITAL_COMMUNITY)

## 2024-05-12 ENCOUNTER — Ambulatory Visit (INDEPENDENT_AMBULATORY_CARE_PROVIDER_SITE_OTHER): Admitting: Student

## 2024-05-12 ENCOUNTER — Encounter (HOSPITAL_COMMUNITY): Payer: Self-pay

## 2024-05-12 ENCOUNTER — Inpatient Hospital Stay: Admitting: Student

## 2024-05-12 VITALS — BP 136/48 | HR 57 | Wt 137.8 lb

## 2024-05-12 DIAGNOSIS — M545 Low back pain, unspecified: Secondary | ICD-10-CM | POA: Insufficient documentation

## 2024-05-12 DIAGNOSIS — G6289 Other specified polyneuropathies: Secondary | ICD-10-CM

## 2024-05-12 DIAGNOSIS — M549 Dorsalgia, unspecified: Secondary | ICD-10-CM

## 2024-05-12 DIAGNOSIS — M542 Cervicalgia: Secondary | ICD-10-CM | POA: Insufficient documentation

## 2024-05-12 DIAGNOSIS — E1122 Type 2 diabetes mellitus with diabetic chronic kidney disease: Secondary | ICD-10-CM | POA: Diagnosis not present

## 2024-05-12 DIAGNOSIS — N189 Chronic kidney disease, unspecified: Secondary | ICD-10-CM | POA: Diagnosis not present

## 2024-05-12 DIAGNOSIS — I129 Hypertensive chronic kidney disease with stage 1 through stage 4 chronic kidney disease, or unspecified chronic kidney disease: Secondary | ICD-10-CM | POA: Diagnosis not present

## 2024-05-12 DIAGNOSIS — K5900 Constipation, unspecified: Secondary | ICD-10-CM | POA: Diagnosis present

## 2024-05-12 DIAGNOSIS — Z7982 Long term (current) use of aspirin: Secondary | ICD-10-CM | POA: Diagnosis not present

## 2024-05-12 DIAGNOSIS — R1084 Generalized abdominal pain: Secondary | ICD-10-CM | POA: Diagnosis not present

## 2024-05-12 DIAGNOSIS — Z794 Long term (current) use of insulin: Secondary | ICD-10-CM | POA: Insufficient documentation

## 2024-05-12 DIAGNOSIS — Z79899 Other long term (current) drug therapy: Secondary | ICD-10-CM | POA: Diagnosis not present

## 2024-05-12 DIAGNOSIS — Z7984 Long term (current) use of oral hypoglycemic drugs: Secondary | ICD-10-CM | POA: Insufficient documentation

## 2024-05-12 LAB — CBC WITH DIFFERENTIAL/PLATELET
Abs Immature Granulocytes: 0.02 K/uL (ref 0.00–0.07)
Basophils Absolute: 0 K/uL (ref 0.0–0.1)
Basophils Relative: 0 %
Eosinophils Absolute: 0.1 K/uL (ref 0.0–0.5)
Eosinophils Relative: 1 %
HCT: 28.7 % — ABNORMAL LOW (ref 36.0–46.0)
Hemoglobin: 9.6 g/dL — ABNORMAL LOW (ref 12.0–15.0)
Immature Granulocytes: 0 %
Lymphocytes Relative: 32 %
Lymphs Abs: 1.5 K/uL (ref 0.7–4.0)
MCH: 31.4 pg (ref 26.0–34.0)
MCHC: 33.4 g/dL (ref 30.0–36.0)
MCV: 93.8 fL (ref 80.0–100.0)
Monocytes Absolute: 0.3 K/uL (ref 0.1–1.0)
Monocytes Relative: 7 %
Neutro Abs: 2.8 K/uL (ref 1.7–7.7)
Neutrophils Relative %: 60 %
Platelets: 221 K/uL (ref 150–400)
RBC: 3.06 MIL/uL — ABNORMAL LOW (ref 3.87–5.11)
RDW: 12.8 % (ref 11.5–15.5)
WBC: 4.7 K/uL (ref 4.0–10.5)
nRBC: 0 % (ref 0.0–0.2)

## 2024-05-12 LAB — COMPREHENSIVE METABOLIC PANEL WITH GFR
ALT: 32 U/L (ref 0–44)
AST: 37 U/L (ref 15–41)
Albumin: 3.8 g/dL (ref 3.5–5.0)
Alkaline Phosphatase: 94 U/L (ref 38–126)
Anion gap: 11 (ref 5–15)
BUN: 47 mg/dL — ABNORMAL HIGH (ref 8–23)
CO2: 23 mmol/L (ref 22–32)
Calcium: 9.4 mg/dL (ref 8.9–10.3)
Chloride: 105 mmol/L (ref 98–111)
Creatinine, Ser: 6.53 mg/dL — ABNORMAL HIGH (ref 0.44–1.00)
GFR, Estimated: 7 mL/min — ABNORMAL LOW (ref >60)
Glucose, Bld: 114 mg/dL — ABNORMAL HIGH (ref 70–99)
Potassium: 4.1 mmol/L (ref 3.5–5.1)
Sodium: 139 mmol/L (ref 135–145)
Total Bilirubin: 0.7 mg/dL (ref 0.0–1.2)
Total Protein: 7.2 g/dL (ref 6.5–8.1)

## 2024-05-12 LAB — URINALYSIS, W/ REFLEX TO CULTURE (INFECTION SUSPECTED)
Bacteria, UA: NONE SEEN
Bilirubin Urine: NEGATIVE
Glucose, UA: 50 mg/dL — AB
Hgb urine dipstick: NEGATIVE
Ketones, ur: NEGATIVE mg/dL
Leukocytes,Ua: NEGATIVE
Nitrite: NEGATIVE
Protein, ur: 100 mg/dL — AB
Specific Gravity, Urine: 1.011 (ref 1.005–1.030)
pH: 6 (ref 5.0–8.0)

## 2024-05-12 LAB — LIPASE, BLOOD: Lipase: 91 U/L — ABNORMAL HIGH (ref 11–51)

## 2024-05-12 MED ORDER — SODIUM CHLORIDE 0.9 % IV BOLUS
1000.0000 mL | Freq: Once | INTRAVENOUS | Status: AC
  Administered 2024-05-12: 1000 mL via INTRAVENOUS

## 2024-05-12 MED ORDER — FLEET ENEMA RE ENEM
1.0000 | ENEMA | Freq: Once | RECTAL | Status: AC
  Administered 2024-05-12: 1 via RECTAL
  Filled 2024-05-12: qty 1

## 2024-05-12 NOTE — ED Provider Triage Note (Signed)
 Emergency Medicine Provider Triage Evaluation Note  Kayla Gregory , a 63 y.o. female  was evaluated in triage.  Pt complains of constipation for over 2 weeks. No recent vomiting. Increasing abdominal pain.  Review of Systems  Positive:  Negative:   Physical Exam  BP (!) 160/70 (BP Location: Left Arm)   Pulse (!) 51   Temp 98.3 Gregory (36.8 C)   Resp 16   SpO2 99%  Gen:   Awake, no distress   Resp:  Normal effort  MSK:   Moves extremities without difficulty  Other:    Medical Decision Making  Medically screening exam initiated at 11:43 AM.  Appropriate orders placed.  Kayla Gregory was informed that the remainder of the evaluation will be completed by another provider, this initial triage assessment does not replace that evaluation, and the importance of remaining in the ED until their evaluation is complete.     Kayla Gregory, [place] 05/12/24 1144

## 2024-05-12 NOTE — ED Provider Notes (Signed)
 MRI without evidence of acute infection.  Patient's labs urinalysis fine.  Blood cultures were sent and are obviously pending.  Complete metabolic panel without any acute findings.  Patient is a dialysis patient.  Potassium those 4.1.  Lipase up a little bit at 91 white count 4.7 hemoglobin 9.6 not too far off from baseline.  Platelets 221.  The key to the MRI is there are persistent abnormal T1-T2 signal intensity and in T11-T12 vertebral bodies no fluid is seen in the disc base no paraspinal abscess phlegmon to suggest this is distinct Titus or osteomyelitis.  Patient will follow-up with her doctors.  Patient stable for discharge.   Eriverto Byrnes, MD 05/12/24 2230

## 2024-05-12 NOTE — ED Triage Notes (Signed)
 Patient BIB [facility] from [facility] across the street for constipation x 3 weeks, patient reports no BM in more than 2 weeks, patient has been seen multiple times for same, tried OTC oral laxatives and suppositories with no relief, no enemas attempted.

## 2024-05-12 NOTE — ED Notes (Signed)
 Pt refusing ct and lab work

## 2024-05-12 NOTE — ED Notes (Signed)
Pt refused CT scan at this time.

## 2024-05-12 NOTE — Patient Instructions (Signed)
 I am sending you to the ED for bowel clean out since we have failed at home management.

## 2024-05-12 NOTE — Discharge Instructions (Signed)
 MRI lumbar spine without evidence of any new infection or discitis.  Still has some signal abnormalities.  Follow-up with your doctors.  Return for any new or worse symptoms.

## 2024-05-12 NOTE — ED Notes (Signed)
 Patient transported to MRI

## 2024-05-12 NOTE — Progress Notes (Signed)
    SUBJECTIVE:   CHIEF COMPLAINT / HPI:   Kayla Gregory is a 63 y.o. female presenting for worsening constipation.    She has not had a bowel movement in 2-3 weeks. I saw her Monday and we tried home enema and bowel clean out with miralax  and she has not been able to have a bowel movement at home. She had a CT AP done on 9/20 that showed no obstruction but a moderate amount of stool. She was recently admitted for discitis and is on long term PO antibiotics.   She feels like there is something in her stomach that needs to come out, like a bowel movement. She continues to only tolerate liquids and has had some nausea and vomiting. She continues to pass gas.   She is a fragile patient with her chronic kidney disease.   PERTINENT  PMH / PSH: reviewed and updated.  OBJECTIVE:   BP (!) 136/48   Pulse (!) 57   Wt 137 lb 12.8 oz (62.5 kg)   SpO2 100%   BMI 26.04 kg/m   Ill-appearing, no acute distress Cardio: Regular rate, regular rhythm, no murmurs on exam. Pulm: Clear, no wheezing, no crackles. No increased work of breathing Abdominal: bowel sounds present, soft,  diffusely tender, non-distended Extremities: no peripheral edema   ASSESSMENT/PLAN:   Assessment & Plan Generalized abdominal pain Concern for severe constipation with no bowel movement in 2-3 weeks and failed outpatient clean out.  Fragile patient with worsening kidney disease and recent admission for discitis on long term antibiotics Sending to ED for bowel clean out and lab monitoring  FMTS service notified of patient transfer      Damien Pinal, DO [facility]

## 2024-05-12 NOTE — ED Notes (Signed)
 Pt. Stated that she feels a lot better after enema

## 2024-05-12 NOTE — ED Provider Notes (Signed)
 [place] EMERGENCY DEPARTMENT AT [facility] Provider Note   CSN: [id number] Arrival date & time: 05/12/24  1104     Patient presents with: Abdominal Pain and Constipation   Kayla Gregory is a 63 y.o. female.   The history is provided by the patient and medical records. No language interpreter was used.  Constipation Severity:  Severe Time since last bowel movement:  2 weeks Timing:  Constant Progression:  Worsening Chronicity:  New Stool description:  None produced Relieved by:  Nothing Worsened by:  Nothing Ineffective treatments:  None tried Associated symptoms: back pain   Associated symptoms: no abdominal pain, no dysuria, no fever, no nausea and no vomiting   Risk factors: recent illness        Prior to Admission medications   Medication Sig Start Date End Date Taking? Authorizing Provider  Accu-Chek Softclix Lancets lancets Use as instructed 11/03/23   Cleotilde Perkins, DO  acetaminophen  (TYLENOL ) 500 MG tablet Take 2 tablets (1,000 mg total) by mouth every 6 (six) hours. 04/29/24   Tharon Lung, MD  aspirin  EC 81 MG tablet Take 81 mg by mouth daily. Swallow whole.    [provider]  atorvastatin  (LIPITOR) 40 MG tablet TAKE 1 TABLET(40 MG) BY MOUTH DAILY 03/08/24   Tharon Lung, MD  benztropine  (COGENTIN ) 1 MG tablet Take by mouth 2 (two) times daily. Per pt take 4 tabs in am and 1 tab at bedtime    [provider]  Blood Glucose Monitoring Suppl (ACCU-CHEK AVIVA PLUS) w/Device KIT 1 application  by Does not apply route 4 (four) times daily -  with meals and at bedtime. 11/03/23   Cleotilde Perkins, DO  Blood Pressure Monitoring (BLOOD PRESSURE CUFF) MISC 1 each by Does not apply route daily. 07/29/22   Cleotilde Perkins, DO  carboxymethylcellulose (REFRESH PLUS) 0.5 % SOLN Place 1 drop into both eyes 3 (three) times daily as needed.    [provider]  cetirizine  (ZYRTEC ) 10 MG tablet Take 1 tablet (10 mg total) by mouth daily as needed for  allergies. 05/21/23   Cleotilde Perkins, DO  ciprofloxacin  (CIPRO ) 500 MG tablet Take 1 tablet (500 mg total) by mouth daily with supper. 04/30/24 06/19/24  Tharon Lung, MD  cloNIDine  (CATAPRES ) 0.1 MG tablet Take 0.1 mg by mouth 2 (two) times daily.    [provider]  cyclobenzaprine  (FLEXERIL ) 10 MG tablet Take 1 tablet (10 mg total) by mouth at bedtime. 03/22/24   Janit Thresa HERO, DPM  diclofenac  Sodium (VOLTAREN  ARTHRITIS PAIN) 1 % GEL Apply 2 g topically 4 (four) times daily. 01/24/24   Johnie Flaming A, NP  diphenhydrAMINE  (BENADRYL ) 25 MG tablet Take 25 mg by mouth every 4 (four) hours as needed.    [provider]  FLUoxetine  (PROZAC ) 10 MG capsule Take 10 mg by mouth daily.    [provider]  gabapentin  (NEURONTIN ) 100 MG capsule TAKE 1 CAPSULE(100 MG) BY MOUTH AT BEDTIME AS NEEDED 05/12/24   Cleotilde Perkins, DO  glucose blood (ACCU-CHEK AVIVA PLUS) test strip Use to check blood glucose up to four times daily 11/03/23   Cleotilde Perkins, DO  insulin  glargine (LANTUS  SOLOSTAR) 100 UNIT/ML Solostar Pen Inject 10 Units into the skin daily. 11/03/23   Cleotilde Perkins, DO  Insulin  Pen Needle (B-D ULTRAFINE III SHORT PEN) 31G X 8 MM MISC USE DAILY AS DIRECTED 11/03/23   Cleotilde Perkins, DO  Lancet Device MISC Check blood glucose once a day in the AM.  04/13/20   Chandra Toribio POUR, MD  Lancet Devices [facility]) lancets Use as instructed 11/22/16   Diallo, Abdoulaye, MD  lidocaine  (LIDODERM ) 5 % Place 1 patch onto the skin daily. Remove & Discard patch within 12 hours or as directed by MD Patient not taking: Reported on 04/24/2024 01/24/24   Johnie Rumaldo LABOR, NP  meloxicam  (MOBIC ) 15 MG tablet TAKE 1 TABLET(15 MG) BY MOUTH DAILY 03/10/24   Janit Thresa HERO, DPM  ondansetron  (ZOFRAN -ODT) 4 MG disintegrating tablet Dissolve 1 tablet (4 mg total) by mouth every 8 (eight) hours as needed for nausea or vomiting. 04/29/24   Tharon Lung, MD  oxyCODONE  (OXY IR/ROXICODONE ) 5 MG immediate release  tablet Take 1 tablet (5 mg total) by mouth every 6 (six) hours as needed for severe pain (pain score 7-10). 04/29/24   Tharon Lung, MD  pantoprazole  (PROTONIX ) 40 MG tablet Take 1 tablet (40 mg total) by mouth daily. 10/01/23   Cleotilde Perkins, DO  polycarbophil (FIBERCON) 625 MG tablet Take 1 tablet (625 mg total) by mouth daily. 04/30/24   Tharon Lung, MD  polyethylene glycol (MIRALAX ) 17 g packet Take 17 g by mouth daily. 01/12/24   Long, Joshua G, MD  polyethylene glycol powder (MIRALAX ) 17 GM/SCOOP powder Take 17 g by mouth daily. Dissolve 1 capful (17g) in 4-8 ounces of liquid and take by mouth daily. 05/08/24   Garrick Charleston, MD  senna-docusate (SENOKOT-S) 8.6-50 MG tablet Take 1 tablet by mouth at bedtime as needed for mild constipation or moderate constipation. 01/12/24   Long, Fonda MATSU, MD  senna-docusate (SENOKOT-S) 8.6-50 MG tablet Take 2 tablets by mouth daily for 10 days. 05/08/24 05/18/24  Garrick Charleston, MD  traZODone (DESYREL) 50 MG tablet Take 50 mg by mouth at bedtime.    [provider]  triamcinolone  cream (KENALOG ) 0.1 % Apply 1 Application topically 2 (two) times daily. 04/15/23   Cleotilde Perkins, DO  valsartan -hydrochlorothiazide  (DIOVAN -HCT) 160-25 MG tablet TAKE 1 TABLET BY MOUTH DAILY 05/10/24   Cleotilde Perkins, DO  ziprasidone  (GEODON ) 20 MG capsule Take 20 mg by mouth daily with breakfast.    [provider]    Allergies: Metformin  and related, Norvasc  [amlodipine ], and Omeprazole     Review of Systems  Constitutional:  Positive for chills and fatigue. Negative for fever.  HENT:  Negative for congestion.   Respiratory:  Negative for cough, choking, chest tightness, shortness of breath and wheezing.   Cardiovascular:  Negative for chest pain.  Gastrointestinal:  Positive for constipation. Negative for abdominal pain, nausea and vomiting.  Genitourinary:  Negative for dysuria.  Musculoskeletal:  Positive for back pain. Negative for neck pain and neck  stiffness.  Skin:  Negative for rash and wound.  Neurological:  Negative for headaches.  Psychiatric/Behavioral:  Negative for agitation and confusion.   All other systems reviewed and are negative.   Updated Vital Signs BP (!) 153/64   Pulse (!) 51   Temp 98.3 F (36.8 C)   Resp 16   SpO2 96%   Physical Exam Vitals and nursing note reviewed.  Constitutional:      General: She is not in acute distress.    Appearance: She is well-developed. She is not ill-appearing, toxic-appearing or diaphoretic.  HENT:     Head: Normocephalic and atraumatic.     Nose: No congestion or rhinorrhea.     Mouth/Throat:     Mouth: Mucous membranes are dry.     Pharynx: No oropharyngeal exudate or posterior oropharyngeal erythema.  Eyes:     Extraocular Movements: Extraocular movements intact.     Conjunctiva/sclera: Conjunctivae normal.     Pupils: Pupils are equal, round, and reactive to light.  Cardiovascular:     Rate and Rhythm: Normal rate and regular rhythm.     Heart sounds: No murmur heard. Pulmonary:     Effort: Pulmonary effort is normal. No respiratory distress.     Breath sounds: Normal breath sounds. No wheezing, rhonchi or rales.  Chest:     Chest wall: No tenderness.  Abdominal:     Palpations: Abdomen is soft.     Tenderness: There is no abdominal tenderness. There is no guarding or rebound.  Musculoskeletal:        General: Tenderness present. No swelling.     Cervical back: Neck supple.     Thoracic back: Tenderness present.     Lumbar back: Tenderness present.       Back:     Comments: No numbness, tingling, or weakness in legs.  Intact pulses in legs.  Tenderness in the midline of the back from the mid thoracic spine down towards the lumbar spine.    Skin:    General: Skin is warm and dry.     Capillary Refill: Capillary refill takes less than 2 seconds.     Findings: No erythema or rash.  Neurological:     General: No focal deficit present.     Mental Status: She  is alert.     Sensory: No sensory deficit.     Motor: No weakness.  Psychiatric:        Mood and Affect: Mood normal.     (all labs ordered are listed, but only abnormal results are displayed) Labs Reviewed  CBC WITH DIFFERENTIAL/PLATELET - Abnormal; Notable for the following components:      Result Value   RBC 3.06 (*)    Hemoglobin 9.6 (*)    HCT 28.7 (*)    All other components within normal limits  URINALYSIS, W/ REFLEX TO CULTURE (INFECTION SUSPECTED) - Abnormal; Notable for the following components:   Color, Urine STRAW (*)    Glucose, UA 50 (*)    Protein, ur 100 (*)    All other components within normal limits  CULTURE, BLOOD (ROUTINE X 2)  CULTURE, BLOOD (ROUTINE X 2)  URINE CULTURE  COMPREHENSIVE METABOLIC PANEL WITH GFR  LIPASE, BLOOD    EKG: None  Radiology: No results found.   Procedures   Medications Ordered in the ED  sodium phosphate  (FLEET) enema 1 enema (has no administration in time range)  sodium chloride  0.9 % bolus 1,000 mL (1,000 mLs Intravenous New Bag/Given 05/12/24 1640)                                    Medical Decision Making Amount and/or Complexity of Data Reviewed Labs: ordered. Radiology: ordered.  Risk OTC drugs.    Kayla Gregory is a 63 y.o. female with a past medical history significant for diabetes, hypertension, hepatitis C, CKD, and recent discovery of thoracic spine discitis/osteomyelitis currently on antibiotics who presents with worsening constipation and back pain.  According to patient, she has not had a bowel movement about 2 weeks and has been taking all sorts of her medications to have a bowel movement.  She reports her back is hurting worse as well.  She reports she is having occasional nausea and vomiting but  not today.  She was told by her PCP to come to the emergency department for a cleanout which patient interprets as coming here for an enema.  She was screened in triage and initially started to refuse CT  abdomen/pelvis because she just had 1 in surgeries and blood work.  On my exam, lungs clear.  Chest nontender.  Abdomen not focally tender.  She did have significant tenderness in her thoracic and lumbar spine and she reports it feels just like before they had to drain the pus out.  She does not have rash or evidence of shingles.  Distally she had intact sensation strength and pulses in her legs.  She has dry mucous membranes.  Clinically I am somewhat concerned that her back infection may have worsened leading to some of his constipation.  In the setting of her worsened back pain as well, we will get imaging to further evaluate.  She had a recent noncontrast MRI of the T-spine however with her pain in the T and L-spine, I called radiology and spoke Dr. Derrill who recommended getting MRI T and L with contrast to rule out abscess development phlegmon or other infection sites.  Patient is amenable to getting the labs, urine, and imaging.  She also still wants an enema so we will order that while we wait.  Care will be transferred oncoming team to wait for MRI results and lab results and reassessment.        Final diagnoses:  Constipation, unspecified constipation type  Acute midline back pain, unspecified back location    Clinical Impression: 1. Constipation, unspecified constipation type   2. Acute midline back pain, unspecified back location     Disposition: Care transferred oncoming team to await MRI results labs and reassessment.  This note was prepared with assistance of Conservation officer, historic buildings. Occasional wrong-word or sound-a-like substitutions may have occurred due to the inherent limitations of voice recognition software.      Shavonna Corella, Lonni PARAS, MD 05/12/24 [phone]

## 2024-05-14 LAB — URINE CULTURE: Culture: 10000 — AB

## 2024-05-15 ENCOUNTER — Telehealth (HOSPITAL_BASED_OUTPATIENT_CLINIC_OR_DEPARTMENT_OTHER): Payer: Self-pay

## 2024-05-15 NOTE — Telephone Encounter (Signed)
 Post ED Visit - Positive Culture Follow-up: Successful Patient Follow-Up  Culture assessed and recommendations reviewed by:  [x]  Dorn Buttner, Pharm.D. []  Venetia Gully, Pharm.D., BCPS AQ-ID []  Garrel Crews, Pharm.D., BCPS []  Almarie Lunger, Pharm.D., BCPS []  [place], [address].D., BCPS, AAHIVP []  Rosaline Bihari, Pharm.D., BCPS, AAHIVP []  Vernell Meier, PharmD, BCPS []  Latanya Hint, PharmD, BCPS []  Donald Medley, PharmD, BCPS []  Rocky Bold, PharmD  Positive urine culture  []  Patient discharged without antimicrobial prescription and treatment is now indicated []  Organism is resistant to prescribed ED discharge antimicrobial []  Patient with positive blood cultures  Elna Redo, MD from [facility] Medicine notified of urine cx wants pt to follow-up with [facility] Medicine team for possible treatment.   Spoke with pt and instructions given.   Changes discussed with Family Med. provider: Elna Redo, MD   Contacted patient, date 05/15/24, time 3:00 pm   Ruth Camelia Elbe 05/15/2024, 3:32 PM

## 2024-05-15 NOTE — Progress Notes (Signed)
 ED Antimicrobial Stewardship Positive Culture Follow Up   Kayla Gregory is an 63 y.o. female who presented to [facility] on @ADMITDT @ with a chief complaint of  Chief Complaint  Patient presents with   Abdominal Pain   Constipation    Recent Results (from the past 720 hours)  Surgical PCR screen     Status: None   Collection Time: 04/24/24  2:31 PM   Specimen: Nasal Mucosa; Nasal Swab  Result Value Ref Range Status   MRSA, PCR NEGATIVE NEGATIVE Final   Staphylococcus aureus NEGATIVE NEGATIVE Final    Comment: (NOTE) The Xpert SA Assay (FDA approved for NASAL specimens in patients 14 years of age and older), is one component of a comprehensive surveillance program. It is not intended to diagnose infection nor to guide or monitor treatment. Performed at [facility], [address], [place], [place] [address]   Culture, blood (Routine X 2) w Reflex to ID Panel     Status: None   Collection Time: 04/24/24  7:17 PM   Specimen: BLOOD  Result Value Ref Range Status   Specimen Description BLOOD BLOOD RIGHT ARM  Final   Special Requests   Final    BOTTLES DRAWN AEROBIC AND ANAEROBIC Blood Culture results may not be optimal due to an inadequate volume of blood received in culture bottles   Culture   Final    NO GROWTH 5 DAYS Performed at [facility], [address] [place], [place] [address]    Report Status 04/29/2024 FINAL  Final  Culture, blood (Routine X 2) w Reflex to ID Panel     Status: None   Collection Time: 04/24/24  7:17 PM   Specimen: BLOOD  Result Value Ref Range Status   Specimen Description BLOOD BLOOD LEFT HAND  Final   Special Requests   Final    BOTTLES DRAWN AEROBIC AND ANAEROBIC Blood Culture results may not be optimal due to an inadequate volume of blood received in culture bottles   Culture   Final    NO GROWTH 5 DAYS Performed at [facility], [address], [place], [place] [address]    Report Status 04/29/2024 FINAL  Final   Aerobic/Anaerobic Culture w Gram Stain (surgical/deep wound)     Status: None   Collection Time: 04/25/24  3:11 PM   Specimen: Wound; Abscess  Result Value Ref Range Status   Specimen Description WOUND  Final   Special Requests INTERVERTEBRAL DISC  Final   Gram Stain NO WBC SEEN RARE GRAM NEGATIVE RODS   Final   Culture   Final    No growth aerobically or anaerobically. Performed at [facility], [address] [place], [place] [address]    Report Status 04/30/2024 FINAL  Final  Urine Culture     Status: Abnormal   Collection Time: 05/12/24  3:35 PM   Specimen: Urine, Clean Catch  Result Value Ref Range Status   Specimen Description URINE, CLEAN CATCH  Final   Special Requests   Final    NONE Performed at [facility], [address] [place], [place] [address]    Culture 10,000 COLONIES/mL STAPHYLOCOCCUS EPIDERMIDIS (A)  Final   Report Status 05/14/2024 FINAL  Final   Organism ID, Bacteria STAPHYLOCOCCUS EPIDERMIDIS (A)  Final      Susceptibility   Staphylococcus epidermidis - MIC*    CIPROFLOXACIN  >=8 RESISTANT Resistant     GENTAMICIN  8 INTERMEDIATE Intermediate  NITROFURANTOIN <=16 SENSITIVE Sensitive     OXACILLIN >=4 RESISTANT Resistant     TETRACYCLINE 2 SENSITIVE Sensitive     VANCOMYCIN 2 SENSITIVE Sensitive     TRIMETH/SULFA <=10 SENSITIVE Sensitive     RIFAMPIN <=0.5 SENSITIVE Sensitive     Inducible Clindamycin  NEGATIVE Sensitive     * 10,000 COLONIES/mL STAPHYLOCOCCUS EPIDERMIDIS  Blood culture (routine x 2)     Status: None (Preliminary result)   Collection Time: 05/12/24  4:06 PM   Specimen: BLOOD  Result Value Ref Range Status   Specimen Description BLOOD RIGHT ANTECUBITAL  Final   Special Requests   Final    BOTTLES DRAWN AEROBIC AND ANAEROBIC Blood Culture adequate volume   Culture   Final    NO GROWTH 3 DAYS Performed at [facility], [address] [place], [place] [address]    Report Status PENDING  Incomplete  Blood  culture (routine x 2)     Status: None (Preliminary result)   Collection Time: 05/12/24  4:07 PM   Specimen: BLOOD  Result Value Ref Range Status   Specimen Description BLOOD LEFT ANTECUBITAL  Final   Special Requests   Final    BOTTLES DRAWN AEROBIC AND ANAEROBIC Blood Culture adequate volume   Culture   Final    NO GROWTH 3 DAYS Performed at [facility], [address], [place], [place] [address]    Report Status PENDING  Incomplete    I discussed with MD Elna Redo from [facility] Medicine team that follows patient. He requests that we notifiy patient to follow up with Family Medicine team on this matter.   It is likely that antibiotics are not needed for this and patient did not have urinary symptoms, but given patient's history they would like to follow up with her.  Provider: Elna Redo (From Family Medicine Team)   Dorn Buttner, PharmD, BCPS 05/15/2024 10:16 AM ED Clinical Pharmacist -  [phone]

## 2024-05-17 LAB — CULTURE, BLOOD (ROUTINE X 2)
Culture: NO GROWTH
Culture: NO GROWTH
Special Requests: ADEQUATE
Special Requests: ADEQUATE

## 2024-05-20 ENCOUNTER — Encounter: Payer: Self-pay | Admitting: Internal Medicine

## 2024-05-20 ENCOUNTER — Inpatient Hospital Stay: Admitting: Internal Medicine

## 2024-05-20 ENCOUNTER — Other Ambulatory Visit: Payer: Self-pay

## 2024-05-20 ENCOUNTER — Ambulatory Visit (INDEPENDENT_AMBULATORY_CARE_PROVIDER_SITE_OTHER): Admitting: Internal Medicine

## 2024-05-20 VITALS — BP 148/77 | HR 57 | Temp 98.6°F | Wt 134.0 lb

## 2024-05-20 DIAGNOSIS — M4644 Discitis, unspecified, thoracic region: Secondary | ICD-10-CM | POA: Diagnosis not present

## 2024-05-20 DIAGNOSIS — M4624 Osteomyelitis of vertebra, thoracic region: Secondary | ICD-10-CM

## 2024-05-20 DIAGNOSIS — M4620 Osteomyelitis of vertebra, site unspecified: Secondary | ICD-10-CM

## 2024-05-20 MED ORDER — CIPROFLOXACIN HCL 500 MG PO TABS: 500.0000 mg | ORAL_TABLET | Freq: Every day | ORAL | 0 refills | Status: DC

## 2024-05-20 NOTE — Progress Notes (Signed)
 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   Peripheral neuropathy 10/04/2019   CKD (chronic kidney disease), stage IV (HCC) 04/06/2019   Chronic sinusitis 12/14/2018   Chronic left shoulder pain 12/27/2016   Stable angina 12/22/2015   GERD (gastroesophageal reflux disease) 12/22/2015   Chronic hepatitis C (HCC) 12/22/2015   DM (diabetes mellitus) (HCC) 03/31/2014   Essential hypertension 03/31/2014    Patient's Medications  New Prescriptions   No medications on file  Previous Medications   ACCU-CHEK SOFTCLIX LANCETS LANCETS    Use as instructed   ACETAMINOPHEN  (TYLENOL ) 500 MG TABLET    Take 2 tablets (1,000 mg total) by mouth every 6 (six) hours.   ASPIRIN  EC 81 MG TABLET    Take 81 mg by mouth daily. Swallow whole.   ATORVASTATIN  (LIPITOR) 40 MG TABLET    TAKE 1 TABLET(40 MG) BY MOUTH DAILY   BENZTROPINE  (COGENTIN ) 1 MG TABLET    Take by mouth 2 (two) times daily. Per pt take 4 tabs in am and 1 tab at bedtime   BLOOD GLUCOSE MONITORING SUPPL (ACCU-CHEK AVIVA PLUS) W/DEVICE KIT    1 application  by Does not apply route 4 (four) times daily -  with meals and at bedtime.   BLOOD PRESSURE MONITORING (BLOOD PRESSURE CUFF) MISC    1 each by Does not apply route daily.   CARBOXYMETHYLCELLULOSE (REFRESH PLUS) 0.5 % SOLN    Place 1 drop into both eyes 3 (three) times daily as needed.   CETIRIZINE  (ZYRTEC ) 10 MG TABLET    Take 1 tablet (10 mg total) by mouth daily as needed for allergies.   CIPROFLOXACIN  (CIPRO ) 500 MG TABLET    Take 1 tablet (500 mg total) by mouth daily with supper.   CLONIDINE  (CATAPRES ) 0.1 MG TABLET    Take 0.1 mg by mouth 2 (two) times daily.   CYCLOBENZAPRINE  (FLEXERIL ) 10 MG TABLET    Take 1 tablet (10 mg total) by mouth at  bedtime.   DICLOFENAC  SODIUM (VOLTAREN  ARTHRITIS PAIN) 1 % GEL    Apply 2 g topically 4 (four) times daily.   DIPHENHYDRAMINE  (BENADRYL ) 25 MG TABLET    Take 25 mg by mouth every 4 (four) hours as needed.   FLUOXETINE  (PROZAC ) 10 MG CAPSULE    Take 10 mg by mouth daily.   GABAPENTIN  (NEURONTIN ) 100 MG CAPSULE    TAKE 1 CAPSULE(100 MG) BY MOUTH AT BEDTIME AS NEEDED   GLUCOSE BLOOD (ACCU-CHEK AVIVA PLUS) TEST STRIP    Use to check blood glucose up to four times daily   INSULIN  GLARGINE (LANTUS  SOLOSTAR) 100 UNIT/ML SOLOSTAR PEN    Inject 10 Units into the skin daily.   INSULIN  PEN NEEDLE (B-D ULTRAFINE III SHORT PEN) 31G X 8 MM MISC    USE DAILY AS DIRECTED   LANCET DEVICE MISC    Check blood glucose once a day in the AM.   LANCET DEVICES (ACCU-CHEK SOFTCLIX) LANCETS    Use as instructed   LIDOCAINE  (LIDODERM ) 5 %    Place 1 patch onto the skin daily. Remove & Discard patch within 12 hours or as directed by MD   MELOXICAM  (MOBIC ) 15 MG TABLET    TAKE 1 TABLET(15 MG) BY MOUTH  DAILY   ONDANSETRON  (ZOFRAN -ODT) 4 MG DISINTEGRATING TABLET    Dissolve 1 tablet (4 mg total) by mouth every 8 (eight) hours as needed for nausea or vomiting.   OXYCODONE  (OXY IR/ROXICODONE ) 5 MG IMMEDIATE RELEASE TABLET    Take 1 tablet (5 mg total) by mouth every 6 (six) hours as needed for severe pain (pain score 7-10).   PANTOPRAZOLE  (PROTONIX ) 40 MG TABLET    Take 1 tablet (40 mg total) by mouth daily.   POLYCARBOPHIL (FIBERCON) 625 MG TABLET    Take 1 tablet (625 mg total) by mouth daily.   POLYETHYLENE GLYCOL (MIRALAX ) 17 G PACKET    Take 17 g by mouth daily.   POLYETHYLENE GLYCOL POWDER (MIRALAX ) 17 GM/SCOOP POWDER    Take 17 g by mouth daily. Dissolve 1 capful (17g) in 4-8 ounces of liquid and take by mouth daily.   SENNA-DOCUSATE (SENOKOT-S) 8.6-50 MG TABLET    Take 1 tablet by mouth at bedtime as needed for mild constipation or moderate constipation.   TRAZODONE (DESYREL) 50 MG TABLET    Take 50 mg by mouth at  bedtime.   TRIAMCINOLONE  CREAM (KENALOG ) 0.1 %    Apply 1 Application topically 2 (two) times daily.   VALSARTAN -HYDROCHLOROTHIAZIDE  (DIOVAN -HCT) 160-25 MG TABLET    TAKE 1 TABLET BY MOUTH DAILY   ZIPRASIDONE  (GEODON ) 20 MG CAPSULE    Take 20 mg by mouth daily with breakfast.  Modified Medications   No medications on file  Discontinued Medications   No medications on file    Subjective: 63 year old female past medical history of CKD, hypertension, diabetes type 2 presents for hospital follow-up of thoracic discitis/osteomyelitis and paraspinal phlegmon.  She had presented with worsening renal function and right flank pain.  CT renal study showed T10-11 disc collapse, paraspinal fat concerning for paraspinal phlegmon and a lytic process but make communicated with Schmorl's not.  MRI spine showed T11-12 collapse status post endplate changes, marrow edema clinic concerning for discitis/osteomyelitis, paraspinal edema, no drainable collection.  She underwent aspiration on 9/7 with radiology Gram stain GNR, cultures no growth.  Reviewed CT from 4/14 which showed large 18 mm superior endplate lucency T11 which may have been Schmorl's nodule.  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  Persistent correlation T11-T1211/1   Review of Systems: Review of Systems  All other systems reviewed and are negative.   Past Medical History:  Diagnosis Date   Chronic kidney disease    GERD (gastroesophageal reflux disease)    Hyperlipidemia associated with type 2 diabetes mellitus (HCC)    Hypertension    Liver disease    Schizophrenia (HCC)     Social History   Tobacco Use   Smoking status: Former    Current packs/day: 0.00    Types: Cigarettes    Start date: 47     Quit date: 2001    Years since quitting: 24.7    Passive exposure: Past   Smokeless tobacco: Never  Vaping Use   Vaping status: Never Used  Substance Use Topics   Alcohol use: No    Comment: per pt quit 2001   Drug use: Not Currently    Comment: 12-04-2022  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    Family History  Problem Relation Age of Onset  Heart attack Sister 32   Heart attack Brother 70   Heart attack Mother 70    Allergies  Allergen Reactions   Metformin  And Related Diarrhea and Other (See Comments)    GI symptoms including abdominal pain and diarrhea - Pt not interested in taking again   Norvasc  [Amlodipine ] Itching   Omeprazole  Rash, Dermatitis and Other (See Comments)    Denies airway involvement    Health Maintenance  Topic Date Due   Medicare Annual Wellness (AWV)  Never done   Pneumococcal Vaccine: 50+ Years (1 of 2 - PCV) Never done   Hepatitis B Vaccines 19-59 Average Risk (1 of 3 - Risk 3-dose series) Never done   Zoster Vaccines- Shingrix (2 of 2) 04/18/2022   HEMOGLOBIN A1C  05/04/2024   OPHTHALMOLOGY EXAM  06/24/2024   FOOT EXAM  02/01/2025   Mammogram  06/10/2025   DTaP/Tdap/Td (3 - Td or Tdap) 12/27/2025   Cervical Cancer Screening (HPV/Pap Cotest)  04/27/2027   Colonoscopy  03/30/2028   Influenza Vaccine  Completed   DEXA SCAN  Completed   Hepatitis C Screening  Completed   HIV Screening  Completed   HPV VACCINES  Aged Out   Meningococcal B Vaccine  Aged Out   COVID-19 Vaccine  Discontinued    Objective:  Vitals:   05/20/24 1420  BP: (!) 148/77  Pulse: (!) 57  Temp: 98.6 F (37 C)  TempSrc: Oral  SpO2: 98%  Weight: 134 lb (60.8 kg)   Body mass index is 25.32 kg/m.  Physical Exam Constitutional:      Appearance: Normal appearance.  HENT:     Head: Normocephalic and atraumatic.     Right Ear: Tympanic membrane normal.     Left Ear: Tympanic membrane normal.     Nose: Nose normal.     Mouth/Throat:      Mouth: Mucous membranes are moist.  Eyes:     Extraocular Movements: Extraocular movements intact.     Conjunctiva/sclera: Conjunctivae normal.     Pupils: Pupils are equal, round, and reactive to light.  Cardiovascular:     Rate and Rhythm: Normal rate and regular rhythm.     Heart sounds: No murmur heard.    No friction rub. No gallop.  Pulmonary:     Effort: Pulmonary effort is normal.     Breath sounds: Normal breath sounds.  Abdominal:     General: Abdomen is flat.     Palpations: Abdomen is soft.  Skin:    General: Skin is warm and dry.  Neurological:     General: No focal deficit present.     Mental Status: She is alert and oriented to person, place, and time.  Psychiatric:        Mood and Affect: Mood normal.    Lab Results Lab Results  Component Value Date   WBC 4.7 05/12/2024   HGB 9.6 (L) 05/12/2024   HCT 28.7 (L) 05/12/2024   MCV 93.8 05/12/2024   PLT 221 05/12/2024    Lab Results  Component Value Date   CREATININE 6.53 (H) 05/12/2024   BUN 47 (H) 05/12/2024   NA 139 05/12/2024   K 4.1 05/12/2024   CL 105 05/12/2024   CO2 23 05/12/2024    Lab Results  Component Value Date   ALT 32 05/12/2024   AST 37 05/12/2024   ALKPHOS 94 05/12/2024   BILITOT 0.7 05/12/2024    Lab Results  Component Value Date   CHOL 125 04/21/2024  HDL 54 04/21/2024   LDLCALC 60 04/21/2024   LDLDIRECT 87 09/01/2020   TRIG 47 04/21/2024   CHOLHDL 2.3 04/21/2024   Lab Results  Component Value Date   LABRPR Non Reactive 06/13/2023   No results found for: HIV1RNAQUANT, HIV1RNAVL, CD4TABS   Problem List Items Addressed This Visit   None  Results   Assessment/Plan #thoracic osteomyelitis/discitis with concern for phlegmon - CT renal CT renal study showed T10-11 disc collapse with increased lytic/cystic excavation concerning for paraspinal phlegmon, Schmorl's nodule.  MRI thoracic spine showed T11-T12 edema concern for chronic discitis/osteomyelitis, paraspinal  edema.  Underwent IR thoracic disc aspiration with cultures no growth although Gram stain showing GNR.  She was discharged on ciprofloxacin  to complete 8 weeks antibiotics EOT 11/1 - Repeat MR lumbar and thoracic spine on 9/24 in the setting of pain showed persistent abnormal T1-2 and T11-12 intensity.  Most consistent with Schmorl's nodule. Plan: -Continue antibiotics complete 8 weeks of ciprofloxacin  EOT 11/1 - Labs today - Follow-up in 2 months to do labs off antibiotics -referral to [facility] due to fracture in the setting of back pain, infeciton on imaging resolved   Loney Stank, MD [facility] 05/20/2024, 2:49 PM   I have personally spent 44 minutes involved in face-to-face and non-face-to-face activities for this patient on the day of the visit. Professional time spent includes the following activities: Preparing to see the patient (review of tests), Obtaining and/or reviewing separately obtained history (admission/discharge record), Performing a medically appropriate examination and/or evaluation , Ordering medications/tests/procedures, referring and communicating with other health care professionals, Documenting clinical information in the EMR, Independently interpreting results (not separately reported), Communicating results to the patient/family/caregiver, Counseling and educating the patient/family/caregiver and Care coordination (not separately reported).

## 2024-05-21 ENCOUNTER — Other Ambulatory Visit: Payer: Self-pay | Admitting: Student

## 2024-05-21 LAB — CBC WITH DIFFERENTIAL/PLATELET
Absolute Lymphocytes: 1444 {cells}/uL (ref 850–3900)
Absolute Monocytes: 426 {cells}/uL (ref 200–950)
Basophils Absolute: 11 {cells}/uL (ref 0–200)
Basophils Relative: 0.3 %
Eosinophils Absolute: 103 {cells}/uL (ref 15–500)
Eosinophils Relative: 2.7 %
HCT: 29.5 % — ABNORMAL LOW (ref 35.0–45.0)
Hemoglobin: 9.5 g/dL — ABNORMAL LOW (ref 11.7–15.5)
MCH: 30.4 pg (ref 27.0–33.0)
MCHC: 32.2 g/dL (ref 32.0–36.0)
MCV: 94.6 fL (ref 80.0–100.0)
MPV: 10.1 fL (ref 7.5–12.5)
Monocytes Relative: 11.2 %
Neutro Abs: 1816 {cells}/uL (ref 1500–7800)
Neutrophils Relative %: 47.8 %
Platelets: 224 Thousand/uL (ref 140–400)
RBC: 3.12 Million/uL — ABNORMAL LOW (ref 3.80–5.10)
RDW: 12.3 % (ref 11.0–15.0)
Total Lymphocyte: 38 %
WBC: 3.8 Thousand/uL (ref 3.8–10.8)

## 2024-05-21 LAB — COMPLETE METABOLIC PANEL WITHOUT GFR
AG Ratio: 1.5 (calc) (ref 1.0–2.5)
ALT: 18 U/L (ref 6–29)
AST: 27 U/L (ref 10–35)
Albumin: 4.2 g/dL (ref 3.6–5.1)
Alkaline phosphatase (APISO): 83 U/L (ref 37–153)
BUN/Creatinine Ratio: 8 (calc) (ref 6–22)
BUN: 44 mg/dL — ABNORMAL HIGH (ref 7–25)
CO2: 24 mmol/L (ref 20–32)
Calcium: 9.3 mg/dL (ref 8.6–10.4)
Chloride: 102 mmol/L (ref 98–110)
Creat: 5.79 mg/dL — ABNORMAL HIGH (ref 0.50–1.05)
Globulin: 2.8 g/dL (ref 1.9–3.7)
Glucose, Bld: 115 mg/dL — ABNORMAL HIGH (ref 65–99)
Potassium: 5 mmol/L (ref 3.5–5.3)
Sodium: 136 mmol/L (ref 135–146)
Total Bilirubin: 0.4 mg/dL (ref 0.2–1.2)
Total Protein: 7 g/dL (ref 6.1–8.1)

## 2024-05-21 LAB — OPHTHALMOLOGY REPORT-SCANNED

## 2024-05-21 LAB — SEDIMENTATION RATE: Sed Rate: 36 mm/h — ABNORMAL HIGH (ref 0–30)

## 2024-05-21 LAB — C-REACTIVE PROTEIN: CRP: <3 mg/L (ref <8.0)

## 2024-06-06 ENCOUNTER — Other Ambulatory Visit: Payer: Self-pay | Admitting: Student

## 2024-06-06 ENCOUNTER — Other Ambulatory Visit: Payer: Self-pay | Admitting: Family Medicine

## 2024-06-06 DIAGNOSIS — K219 Gastro-esophageal reflux disease without esophagitis: Secondary | ICD-10-CM

## 2024-06-10 NOTE — Progress Notes (Signed)
 Referring Physician:  Dennise Kingsley, MD [address], [address] [place],  [place] [address]  Primary Physician:  Cleotilde Perkins, DO  History of Present Illness: 06/22/2024 Kayla Gregory with a past medical history of CKD, hypertension, diabetes type 2 who comes today to discuss potential thoracic discitis/osteomyelitis with paraspinal phlegmon seen in the ED on 9/7.  She initially presented with worsening flank pain.  She underwent an aspiration on 9/7 which showed gram-negative rods, culture showed no growth.  Was thought that there is most likely Schmorl's nodes with subsequent endplate fractures.  She underwent 8 weeks of antibiotic therapy with ciprofloxacin  and was followed by infectious disease.  Currently she states that she has some right sided back pain that she has been experiencing since her IR biopsy.  Otherwise does not feel as though she is in the significant amount of pain.  She has some numbness that she states on the back of her knees.  She has no radiating back pain.  She states she has some questional weakness in lower extremities but denies any falls.  Denies any fevers and chills.        Bowel/Bladder Dysfunction: none  Conservative measures:  Physical therapy: Has not participated in Multimodal medical therapy including regular antiinflammatories:  Tylenol , cyclobenzaprine , gabapentin , meloxicam , oxycodone  Injections:  no epidural steroid injections  Past Surgery: no spine surgery    The symptoms are causing a significant impact on the patient's life.   Review of Systems:  A 10 point review of systems is negative, except for the pertinent positives and negatives detailed in the HPI.  Past Medical History: Past Medical History:  Diagnosis Date   Chronic kidney disease    GERD (gastroesophageal reflux disease)    Hyperlipidemia associated with type 2 diabetes mellitus (HCC)    Hypertension    Liver disease    Schizophrenia [facility])     Past  Surgical History: Past Surgical History:  Procedure Laterality Date   BUNIONECTOMY Left 12/12/2022   Procedure: KELLER BUNION IMPLANT;  Surgeon: Janit Thresa HERO, DPM;  Location: [facility];  Service: Podiatry;  Laterality: Left;   CAPSULOTOMY METATARSOPHALANGEAL Left 12/12/2022   Procedure: CAPSULOTOMY METATARSOPHALANGEAL SECOND;  Surgeon: Janit Thresa HERO, DPM;  Location: [facility];  Service: Podiatry;  Laterality: Left;   CATARACT EXTRACTION W/ INTRAOCULAR LENS  IMPLANT, BILATERAL Bilateral    approx 2020   COLONOSCOPY  06/2020   ECTOPIC PREGNANCY SURGERY     1980s   FOOT SURGERY Bilateral 03/05/2017   @ [facility] by dr b. janit;   right 1st bunionectomy/  right 2nd hammertoe correction;  left and right 5th hammer toe correction   HAMMER TOE SURGERY Left 12/12/2022   Procedure: HAMMER TOE CORRECTION SECOND;  Surgeon: Janit Thresa HERO, DPM;  Location: [facility];  Service: Podiatry;  Laterality: Left;   IR THORACIC DISC ASPIRATION W/IMG GUIDE  04/25/2024   PTOSIS REPAIR Left 12/25/2017   Procedure: INTERNAL PTOSIS REPAIR LEFT EYE;  Surgeon: Laurie Loyd Redhead, MD;  Location: [facility] OR;  Service: Ophthalmology;  Laterality: Left;   TONSILLECTOMY     age 63   TOTAL HIP ARTHROPLASTY Left 2011    Allergies: Allergies as of 06/22/2024 - Review Complete 06/22/2024  Allergen Reaction Noted   Metformin  and related Diarrhea and Other (See Comments) 11/20/2017   Norvasc  [amlodipine ] Itching 12/04/2022   Omeprazole  Rash, Dermatitis, and Other (See Comments) 06/24/2014    Medications: Outpatient Encounter Medications as of 06/22/2024  Medication Sig   Accu-Chek Softclix Lancets lancets Use as instructed   acetaminophen  (TYLENOL ) 500 MG tablet Take 2 tablets (1,000 mg total) by mouth every 6 (six) hours.   aspirin  EC 81 MG tablet Take 81 mg by mouth daily. Swallow whole.   atorvastatin  (LIPITOR) 40 MG tablet TAKE 1 TABLET(40 MG) BY MOUTH DAILY   benztropine   (COGENTIN ) 1 MG tablet Take by mouth 2 (two) times daily. Per pt take 4 tabs in am and 1 tab at bedtime   Blood Glucose Monitoring Suppl (ACCU-CHEK AVIVA PLUS) w/Device KIT 1 application  by Does not apply route 4 (four) times daily -  with meals and at bedtime.   Blood Pressure Monitoring (BLOOD PRESSURE CUFF) MISC 1 each by Does not apply route daily.   carboxymethylcellulose (REFRESH PLUS) 0.5 % SOLN Place 1 drop into both eyes 3 (three) times daily as needed.   cloNIDine  (CATAPRES ) 0.1 MG tablet Take 0.1 mg by mouth 2 (two) times daily.   cyclobenzaprine  (FLEXERIL ) 10 MG tablet Take 1 tablet (10 mg total) by mouth at bedtime.   diclofenac  Sodium (VOLTAREN  ARTHRITIS PAIN) 1 % GEL Apply 2 g topically 4 (four) times daily.   diphenhydrAMINE  (BENADRYL ) 25 MG tablet Take 25 mg by mouth every 4 (four) hours as needed.   FLUoxetine  (PROZAC ) 10 MG capsule Take 10 mg by mouth daily.   gabapentin  (NEURONTIN ) 100 MG capsule TAKE 1 CAPSULE(100 MG) BY MOUTH AT BEDTIME AS NEEDED   glucose blood (ACCU-CHEK AVIVA PLUS) test strip Use to check blood glucose up to four times daily   insulin  glargine (LANTUS  SOLOSTAR) 100 UNIT/ML Solostar Pen Inject 10 Units into the skin daily.   Insulin  Pen Needle (B-D ULTRAFINE III SHORT PEN) 31G X 8 MM MISC USE DAILY AS DIRECTED   Lancet Device MISC Check blood glucose once a day in the AM.   Lancet Devices (ACCU-CHEK SOFTCLIX) lancets Use as instructed   meloxicam  (MOBIC ) 15 MG tablet TAKE 1 TABLET(15 MG) BY MOUTH DAILY   pantoprazole  (PROTONIX ) 40 MG tablet TAKE 1 TABLET(40 MG) BY MOUTH DAILY   polycarbophil (FIBERCON) 625 MG tablet Take 1 tablet (625 mg total) by mouth daily.   polyethylene glycol (MIRALAX ) 17 g packet Take 17 g by mouth daily.   senna-docusate (SENOKOT-S) 8.6-50 MG tablet Take 1 tablet by mouth at bedtime as needed for mild constipation or moderate constipation.   traZODone (DESYREL) 50 MG tablet Take 50 mg by mouth at bedtime.   triamcinolone  cream  (KENALOG ) 0.1 % Apply 1 Application topically 2 (two) times daily.   valsartan -hydrochlorothiazide  (DIOVAN -HCT) 160-25 MG tablet TAKE 1 TABLET BY MOUTH DAILY   ziprasidone  (GEODON ) 20 MG capsule Take 20 mg by mouth daily with breakfast.   [DISCONTINUED] cetirizine  (ZYRTEC ) 10 MG tablet TAKE 1 TABLET(10 MG) BY MOUTH DAILY AS NEEDED FOR ALLERGIES   [DISCONTINUED] ciprofloxacin  (CIPRO ) 500 MG tablet Take 1 tablet (500 mg total) by mouth daily with supper.   [DISCONTINUED] gabapentin  (NEURONTIN ) 100 MG capsule TAKE 1 CAPSULE(100 MG) BY MOUTH AT BEDTIME AS NEEDED   [DISCONTINUED] lidocaine  (LIDODERM ) 5 % Place 1 patch onto the skin daily. Remove & Discard patch within 12 hours or as directed by MD   [DISCONTINUED] ondansetron  (ZOFRAN -ODT) 4 MG disintegrating tablet Dissolve 1 tablet (4 mg total) by mouth every 8 (eight) hours as needed for nausea or vomiting.   [DISCONTINUED] oxyCODONE  (OXY IR/ROXICODONE ) 5 MG immediate release tablet Take 1 tablet (5 mg total) by mouth every 6 (six)  hours as needed for severe pain (pain score 7-10).   [DISCONTINUED] polyethylene glycol powder (MIRALAX ) 17 GM/SCOOP powder Take 17 g by mouth daily. Dissolve 1 capful (17g) in 4-8 ounces of liquid and take by mouth daily.   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 Attention to examination is appropriate.  Psychiatric: Patient is non-anxious.  Head:  Pupils equal, round, and reactive to light.  ENT:  Oral mucosa appears well hydrated.  Neck:   Supple.  Full range of motion.  Respiratory: Patient is breathing without any difficulty.  Extremities: No edema.  Vascular: Palpable dorsal pedal pulses.  Skin:   On exposed skin, there are no abnormal skin lesions.  NEUROLOGICAL:     Awake, alert, oriented to person, place, and time.  Speech is clear and fluent. Fund of knowledge is appropriate.   Cranial Nerves: Pupils equal round and reactive to light.  Facial tone is symmetric.   ROM of spine: Mild tenderness palpation of right sided   Strength:  Side Iliopsoas Quads Hamstring PF DF EHL  R 4+ 5 5 5 5 5   L 4+ 5 5 5 5 5    Reflexes 2+ bilateral patella. No difficulty with tandem gait.  Medical Decision Making  Imaging:  CRP: <3 (05/20/24) ESR: 36 (05/20/24)  EXAM: MRI THORACIC AND LUMBAR SPINE WITHOUT CONTRAST   TECHNIQUE: Multiplanar and multiecho pulse sequences of the thoracic and lumbar spine were obtained without intravenous contrast.   COMPARISON:  MRI thoracic spine 04/24/2024   FINDINGS: MRI THORACIC SPINE FINDINGS   Alignment: Stable mild kyphotic deformity at T11-12. The thoracic vertebral bodies are otherwise normally aligned.   Vertebrae: Persistent abnormal T1 and T2 signal intensity in the T11 and T12 vertebral bodies. No fluid is seen in the disc space and no paraspinal abscess or phlegmon to suggest this is discitis and osteomyelitis. This is more likely Schmorl's nodes with subsequent fractures. The other thoracic vertebral bodies demonstrate normal marrow signal.   Cord: Normal cord signal intensity. No cord lesions or  syrinx. Persistent cord compression at T11-12.   Paraspinal and other soft tissues: No significant paraspinal findings. No abscess. No significant posterior mediastinal or posterior lung findings.   Disc levels:   Disc spaces are unremarkable except at T11-12 with there are endplate fractures of T11 and T12 along with retropulsion, disc protrusion and significant canal stenosis with compression and flattening of the thoracic spinal cord. No significant change when compared to the recent prior study from 2 weeks ago.  MRI LUMBAR SPINE FINDINGS   Segmentation: Based on the numbering of the thoracic spine there is lumbarization of S1 and a full disc space at S1-2.   Alignment: Bilateral pars defects noted at S1 with grade 1 spondylolisthesis. The other lumbar vertebral bodies are normally aligned.   Vertebrae: Normal marrow signal. No bone lesions or fractures. Stable moderate-sized Schmorl's node involving the superior endplate of L1.   Conus medullaris and cauda equina: Conus extends to the L1 level. Conus and cauda equina appear normal.   Paraspinal and other soft tissues: No significant paraspinal retroperitoneal findings. Simple bilateral renal cysts not requiring any further imaging evaluation follow-up.   Disc levels:   L1-2: No significant findings.   L2-3: No significant findings.   L3-4: Mild to moderate facet disease but no disc protrusions, spinal foraminal stenosis.   L4-5: Moderate facet disease and ligamentum flavum thickening but no disc protrusions, spinal or foraminal stenosis.   L5-S1: Bulging annulus, severe facet disease and ligamentum flavum thickening contributing to mild bilateral lateral recess stenosis. No significant spinal or foraminal stenosis.   S1-2: Large bulging uncovered disc, facet disease and ligamentum flavum thickening contributing to mild spinal and bilateral lateral recess stenosis and significant bilateral foraminal  stenosis.   IMPRESSION: 1. Persistent abnormal T1 and T2 signal intensity in the T11 and T12 vertebral bodies. No fluid is seen in the disc space and no paraspinal abscess or phlegmon to suggest this is discitis and osteomyelitis. This is more likely Schmorl's nodes with subsequent endplate fractures. 2. Persistent cord compression at T11-12. 3. Bilateral pars defects at S1 with grade 1 spondylolisthesis. 4. Mild spinal and bilateral lateral recess stenosis and significant bilateral foraminal stenosis at S1-2. 5. Mild bilateral lateral recess stenosis at L5-S1.  I have personally reviewed the images and agree with the above interpretation.  Assessment and Plan: Kayla Gregory is a pleasant 63 y.o. female with a past medical history of CKD, hypertension, diabetes type 2 who comes today to discuss potential thoracic discitis/osteomyelitis with paraspinal phlegmon seen in the ED on 9/7.  She initially presented with worsening flank pain.  She underwent an aspiration on 9/7 which showed gram-negative rods, culture showed no growth.  Was thought that there is most likely Schmorl's nodes with subsequent endplate fractures.  She underwent 8 weeks of antibiotic therapy with ciprofloxacin  and was followed by infectious disease.  Currently she states that she has some right sided back pain that she has been experiencing since her IR biopsy.  Otherwise does not feel as though she is in the significant amount of pain.  She has some numbness that she states on the back of her knees.  She has no radiating back pain.  She states she has some questional weakness in lower extremities but denies any falls.  Denies any fevers and chills.   Concerned based off imaging that patient could have worsening kyphosis or discitis.  Plan includes the following moving forward:  -X-rays today to evaluate for worsening kyphosis - CT thoracic spine without contrast - MRI thoracic spine with and without contrast  Would like to  evaluate stability and patient's cord compression to the best of her ability.  Will review results once complete.  Red flag symptoms reviewed with the patient which she should notify us .   Lyle Decamp, PA-C Dept. of Neurosurgery

## 2024-06-11 ENCOUNTER — Ambulatory Visit
Admission: RE | Admit: 2024-06-11 | Discharge: 2024-06-11 | Disposition: A | Discharge status: Home / Self Care | Source: Ambulatory Visit | Attending: Obstetrics and Gynecology | Admitting: Obstetrics and Gynecology

## 2024-06-11 DIAGNOSIS — Z1231 Encounter for screening mammogram for malignant neoplasm of breast: Secondary | ICD-10-CM

## 2024-06-15 ENCOUNTER — Other Ambulatory Visit: Payer: Self-pay | Admitting: Student

## 2024-06-15 DIAGNOSIS — G6289 Other specified polyneuropathies: Secondary | ICD-10-CM

## 2024-06-16 ENCOUNTER — Ambulatory Visit: Payer: Self-pay | Admitting: Obstetrics and Gynecology

## 2024-06-22 ENCOUNTER — Ambulatory Visit: Admitting: Physician Assistant

## 2024-06-22 ENCOUNTER — Encounter: Payer: Self-pay | Admitting: Physician Assistant

## 2024-06-22 ENCOUNTER — Other Ambulatory Visit

## 2024-06-22 VITALS — BP 176/72 | Ht 61.0 in | Wt 141.4 lb

## 2024-06-22 DIAGNOSIS — M4644 Discitis, unspecified, thoracic region: Secondary | ICD-10-CM

## 2024-06-22 DIAGNOSIS — M545 Low back pain, unspecified: Secondary | ICD-10-CM

## 2024-06-22 DIAGNOSIS — M549 Dorsalgia, unspecified: Secondary | ICD-10-CM

## 2024-06-30 ENCOUNTER — Other Ambulatory Visit

## 2024-06-30 LAB — OPHTHALMOLOGY REPORT-SCANNED

## 2024-07-14 ENCOUNTER — Other Ambulatory Visit: Payer: Self-pay | Admitting: Student

## 2024-07-14 DIAGNOSIS — G6289 Other specified polyneuropathies: Secondary | ICD-10-CM

## 2024-08-02 ENCOUNTER — Ambulatory Visit: Admitting: Student

## 2024-08-02 ENCOUNTER — Encounter: Payer: Self-pay | Admitting: Student

## 2024-08-02 VITALS — BP 139/47 | HR 72 | Ht 61.0 in | Wt 143.8 lb

## 2024-08-02 DIAGNOSIS — D638 Anemia in other chronic diseases classified elsewhere: Secondary | ICD-10-CM

## 2024-08-02 DIAGNOSIS — N184 Chronic kidney disease, stage 4 (severe): Secondary | ICD-10-CM

## 2024-08-02 DIAGNOSIS — E1100 Type 2 diabetes mellitus with hyperosmolarity without nonketotic hyperglycemic-hyperosmolar coma (NKHHC): Secondary | ICD-10-CM

## 2024-08-02 NOTE — Assessment & Plan Note (Signed)
 Check A1c today  Continue Lantus  10 units daily

## 2024-08-02 NOTE — Progress Notes (Signed)
° ° °  SUBJECTIVE:   CHIEF COMPLAINT / HPI:   Kayla Gregory is a 63 y.o. female presenting for follow up of chronic conditions.   CKD5 not on dialysis:  Doing well Follows renal diet  Reports normal urine output   Back pain:  Scheduled to see NSGY for evaluation of surgery   Leg lesion:  Present for 1-2 years Started as a small mole  Has continued to grow and get larger  Will pick at it but continues to grow back  Would like to have it removed.   PERTINENT  PMH / PSH: reviewed and updated.  OBJECTIVE:   BP (!) 139/47   Pulse 72   Ht 5' 1 (1.549 m)   Wt 143 lb 12.8 oz (65.2 kg)   SpO2 100%   BMI 27.17 kg/m   Well-appearing, no acute distress Cardio: Regular rate, regular rhythm, no murmurs on exam. Pulm: Clear, no wheezing, no crackles. No increased work of breathing Abdominal: bowel sounds present, soft, non-tender, non-distended  Skin:  Large 1-1.5 cm lesion on the right thigh, raised with rough border, appears to have well circumscribed border with minimal color variation    ASSESSMENT/PLAN:   Assessment & Plan CKD (chronic kidney disease), stage IV (HCC) Recheck BMP today  Is not uremic on exam  Continue renal diet  Follows closely with nephrology  Anemia, chronic disease Recheck CBC today  Does not feel anemic  Type 2 diabetes mellitus with hyperosmolarity without coma, with long-term current use of insulin  (HCC) Check A1c today  Continue Lantus  10 units daily      Damien Pinal, DO [facility]

## 2024-08-02 NOTE — Assessment & Plan Note (Signed)
 Recheck BMP today  Is not uremic on exam  Continue renal diet  Follows closely with nephrology

## 2024-08-02 NOTE — Patient Instructions (Addendum)
 It was great to see you today!   I have schedule a procedure for 12/30. To have the spot removed on your leg.   I have ordered lab work today. I will send you a message through MyChart or send you a letter with your results. If there is an abnormal result, I will give you a call.    I recommend you get the shingles vaccine. You can get this at any available pharmacy with the printed prescription free of charge. People who get this vaccine can reduce their risk of shingles by 97%. Please let the office know if you get this vaccine so we can add it to our records.    Future Appointments  Date Time Provider Department Center  08/02/2024  8:50 AM Cleotilde Perkins, DO [facility]  08/17/2024  8:30 AM Cleotilde Perkins, DO [facility]    Please arrive 15 minutes before your appointment to ensure smooth check in process.  If you are more than 15 minutes late, you may be asked to reschedule.   Please bring a list of your medications with you to all appointments.   Please call the clinic at [phone] if your symptoms worsen or you have any concerns.  Thank you for allowing me to participate in your care, Dr. Perkins Cleotilde [facility]

## 2024-08-03 ENCOUNTER — Ambulatory Visit: Payer: Self-pay | Admitting: Student

## 2024-08-03 LAB — CBC
Hematocrit: 30.2 % — ABNORMAL LOW (ref 34.0–46.6)
Hemoglobin: 9.5 g/dL — ABNORMAL LOW (ref 11.1–15.9)
MCH: 30.7 pg (ref 26.6–33.0)
MCHC: 31.5 g/dL (ref 31.5–35.7)
MCV: 98 fL — ABNORMAL HIGH (ref 79–97)
Platelets: 229 x10E3/uL (ref 150–450)
RBC: 3.09 x10E6/uL — ABNORMAL LOW (ref 3.77–5.28)
RDW: 12.9 % (ref 11.7–15.4)
WBC: 4.5 x10E3/uL (ref 3.4–10.8)

## 2024-08-03 LAB — HEMOGLOBIN A1C
Est. average glucose Bld gHb Est-mCnc: 151 mg/dL
Hgb A1c MFr Bld: 6.9 % — ABNORMAL HIGH (ref 4.8–5.6)

## 2024-08-03 LAB — BASIC METABOLIC PANEL WITH GFR
BUN/Creatinine Ratio: 10 — ABNORMAL LOW (ref 12–28)
BUN: 66 mg/dL — ABNORMAL HIGH (ref 8–27)
CO2: 23 mmol/L (ref 20–29)
Calcium: 9.6 mg/dL (ref 8.7–10.3)
Chloride: 101 mmol/L (ref 96–106)
Creatinine, Ser: 6.69 mg/dL — ABNORMAL HIGH (ref 0.57–1.00)
Glucose: 210 mg/dL — ABNORMAL HIGH (ref 70–99)
Potassium: 5.3 mmol/L — ABNORMAL HIGH (ref 3.5–5.2)
Sodium: 139 mmol/L (ref 134–144)
eGFR: 6 mL/min/1.73 — ABNORMAL LOW (ref >59)

## 2024-08-05 ENCOUNTER — Telehealth: Payer: Self-pay

## 2024-08-05 NOTE — Telephone Encounter (Addendum)
 Patient calls nurse line in regards to medical records.   She reports she had dental work done at [facility] yesterday. She reports 5 teeth were pulled.   She reports they are requesting her medication list. She reports verbal consent to have us  send.   Advised we would need a ROI from [facility].  Patient was appreciative of time and information.

## 2024-08-13 LAB — OPHTHALMOLOGY REPORT-SCANNED

## 2024-08-14 ENCOUNTER — Other Ambulatory Visit: Payer: Self-pay | Admitting: Student

## 2024-08-14 DIAGNOSIS — G6289 Other specified polyneuropathies: Secondary | ICD-10-CM

## 2024-08-17 ENCOUNTER — Encounter: Payer: Self-pay | Admitting: Student

## 2024-08-17 ENCOUNTER — Ambulatory Visit (INDEPENDENT_AMBULATORY_CARE_PROVIDER_SITE_OTHER): Payer: Self-pay | Admitting: Student

## 2024-08-17 VITALS — BP 171/63 | HR 59 | Ht 61.0 in | Wt 146.6 lb

## 2024-08-17 DIAGNOSIS — L110 Acquired keratosis follicularis: Secondary | ICD-10-CM | POA: Diagnosis not present

## 2024-08-17 DIAGNOSIS — L989 Disorder of the skin and subcutaneous tissue, unspecified: Secondary | ICD-10-CM

## 2024-08-17 DIAGNOSIS — I10 Essential (primary) hypertension: Secondary | ICD-10-CM

## 2024-08-17 NOTE — Progress Notes (Signed)
" ° ° °  SUBJECTIVE:   CHIEF COMPLAINT / HPI:   KARMIN KASPRZAK is a 63 y.o. female presenting for excisional biopsy of skin lesion noted on her inner right thigh.   PERTINENT  PMH / PSH: reviewed and updated.  OBJECTIVE:   BP (!) 171/63   Pulse (!) 59   Ht 5' 1 (1.549 m)   Wt 146 lb 9.6 oz (66.5 kg)   SpO2 100%   BMI 27.70 kg/m   Well-appearing, no acute distress Cardio: Regular rate, regular rhythm, no murmurs on exam. Pulm: Clear, no wheezing, no crackles. No increased work of breathing  Skin: 1.5 cm raised dark lesion on the inner right thigh, indurated and flaking   ASSESSMENT/PLAN:   Assessment & Plan Skin lesion All options of treatment discussed with patients she elected to proceed with biopsy.   Skin Excisional Biopsy Procedure Note:  Consent obtained and time out performed. Area cleaned with alcohol and 8 ml of 1% lidocaine  with epinephrine  injected in the skin. Then area cleaned with betadine.  A sterile fenestrated drape was placed. Then an elliptical incision was made with a scalpel and the lesion was removed with a border of normal appearing skin. The specimen was collected and sent off to pathology. Using 3-0 nylon, 5 sutures were placed with good hemostasis and wound closure. A dressing was applied. No bleeding, and the patient did well.   Supervising Preceptor: Dr. Madelon  Patient to return in 7 days for suture removal.  Essential hypertension BP is above goal this morning. However patient reports it is elevated due to procedure.  She regularly checks her BP at home with SBP 120s  Continue to monitor each visit, no changes to medications today      Damien Pinal, DO [facility]  "

## 2024-08-17 NOTE — Patient Instructions (Signed)
 You had an excisional biopsy with 5 stiches placed.   You will need to come back in 7 days to have the stiches removed.   Please keep your dressing on for 24 hours. After that change the dressing daily. Do not take baths.   Please return if you have increased pain, redness or drainage from the biopsy site.   I will have pathology results back in 1-2 weeks.

## 2024-08-17 NOTE — Assessment & Plan Note (Addendum)
 BP is above goal this morning. However patient reports it is elevated due to procedure.  She regularly checks her BP at home with SBP 120s  Continue to monitor each visit, no changes to medications today

## 2024-08-20 LAB — DERMATOLOGY PATHOLOGY

## 2024-08-24 ENCOUNTER — Ambulatory Visit (INDEPENDENT_AMBULATORY_CARE_PROVIDER_SITE_OTHER): Payer: Self-pay | Admitting: Student

## 2024-08-24 ENCOUNTER — Encounter: Payer: Self-pay | Admitting: Student

## 2024-08-24 VITALS — BP 160/72 | HR 73 | Wt 138.2 lb

## 2024-08-24 DIAGNOSIS — Z4802 Encounter for removal of sutures: Secondary | ICD-10-CM

## 2024-08-24 DIAGNOSIS — I10 Essential (primary) hypertension: Secondary | ICD-10-CM | POA: Diagnosis not present

## 2024-08-24 NOTE — Progress Notes (Signed)
 Kayla Gregory  [address] [place] [address]   [phone]         Orders Placed This Encounter  Procedures   Ambulatory referral to Solid Organ Transplant Team

## 2024-08-24 NOTE — Progress Notes (Signed)
" ° ° °  SUBJECTIVE:   CHIEF COMPLAINT / HPI:   Kayla Gregory is a 64 y.o. female presenting for follow up.   Excisional biopsy:  - wound doing well  - reports keeping dressing on the wound  - denies drainage from the site - denies fever chills   HTN:  Checks her blood pressure daily  Reports SBP in 120s at home   PERTINENT  PMH / PSH: reviewed and updated.  OBJECTIVE:   BP (!) 160/72   Pulse 73   Wt 138 lb 3.2 oz (62.7 kg)   SpO2 100%   BMI 26.11 kg/m   Well-appearing, no acute distress Cardio: Regular rate, regular rhythm, no murmurs on exam. Pulm: Clear, no wheezing, no crackles. No increased work of breathing Abdominal: bowel sounds present, soft, non-tender, non-distended Extremities: no peripheral edema   Skin: healing incision on right medial thigh, no drainage, mild erythema  ASSESSMENT/PLAN:   Assessment & Plan Essential hypertension BP above goal in the office, however patient has reliable home readings.  Continue BP meds as currently prescribed.  Encounter for removal of sutures Incision without signs of infection.  Patient unable to tolerate suture removal without local anesthetic.  She verbally consented to administration of lidocaine  w epi.  Prepped the skin with alcohol, injected ~1.5 cc lidocaine  along incision near sutures.  5 sutures successfully removed from incision.  Patient tolerated procedure well.  Bandage applied with wound care instructions.      Damien Pinal, DO [facility]  "

## 2024-08-24 NOTE — Assessment & Plan Note (Addendum)
 BP above goal in the office, however patient has reliable home readings.  Continue BP meds as currently prescribed.

## 2024-08-24 NOTE — Patient Instructions (Signed)
Suture Removal, Care After The following information offers guidance on how to care for yourself after your procedure. Your health care provider may also give you more specific instructions. If you have problems or questions, contact your health care provider. What can I expect after the procedure? After your stitches (sutures) are removed, it is common to have: Some discomfort and swelling in the area. Slight redness in the area. Follow these instructions at home: If you have a dressing: Wash your hands with soap and water for at least 20 seconds before and after you change your bandage (dressing). If soap and water are not available, use hand sanitizer. Change your dressing as told by your health care provider. If your dressing becomes wet or dirty, or develops a bad smell, change it as soon as possible. If your dressing sticks to your skin, pour warm, clean water over it until it loosens and can be removed without pulling apart the wound edges. Pat the area dry with a soft, clean towel. Do not rub the wound because that may cause bleeding. Wound care  Check your wound every day for signs of infection. Check for: More redness, swelling, or pain. Fluid or blood. New warmth, a rash, or hardness at the wound site. Pus or a bad smell. Wash your hands with soap and water for at least 20 seconds before and after touching your wound. If soap and water are not available, use hand sanitizer. Keep the wound area dry and clean. Clean and pat the wound dry as told by your health care provider. Apply cream or ointment only as told by your health care provider. If skin glue or adhesive strips were applied after sutures were removed, leave these closures in place. They may need to stay in place for 2 weeks or longer. If adhesive strip edges start to loosen and curl up, you may trim the loose edges. Do not remove adhesive strips completely unless your health care provider tells you to do that. Continue to  protect the wound from injury. Do not pick at your wound. Picking can cause an infection. Bathing Do not take baths, swim, or use a hot tub until your health care provider approves. Ask your health care provider if you may take showers. Follow these steps for showering: If you have a dressing, remove it before getting into the shower. In the shower, allow soapy water to get on the wound. Avoid scrubbing the wound. When you get out of the shower, dry the wound by patting it with a clean towel. Reapply a dressing over the wound, if needed. Scar care When your wound has completely healed, help decrease the size of your scar by: Wearing sunscreen over the scar or covering it with clothing when you are outside. New scars get sunburned easily, which can make scarring worse. Gently massaging the scarred area. This can decrease scar thickness. General instructions Take over-the-counter and prescription medicines only as told by your health care provider. Keep all follow-up visits. This is important. Contact a health care provider if: You have more redness, swelling, or pain around your wound. You have fluid or blood coming from your wound. You have new warmth, a rash, or hardness at the wound site. You have pus or a bad smell coming from your wound. Your wound opens up. Get help right away if: You have a fever or chills. You have red streaks coming from your wound. Summary After your sutures are removed, it is common to have some discomfort  and swelling in the area. Wash your hands with soap and water before you change your bandage (dressing). Keep the wound area dry and clean. Do not take baths, swim, or use a hot tub until your health care provider approves. This information is not intended to replace advice given to you by your health care provider. Make sure you discuss any questions you have with your health care provider. Document Revised: 11/28/2020 Document Reviewed:  11/28/2020 Elsevier Patient Education  2024 [employer].

## 2024-08-25 NOTE — Progress Notes (Signed)
 " [facility] Outpatient Return Visit    History of Present Illness:  Kayla Gregory presents for follow up of her HTN and stage V CKD.  She was seen at [facility] ED on 9/6/20255 for flank pain. A CT scan/MRI showed concerns for discitis. IR had drained the area and she was placed on a course of Ciprofloxacin , which she has completed.   She was seen at [facility] ED on 05/15/2024 for some flank pain.  She  feels well. Appetite is fair. No nausea, vomiting or odd taste in the mouth. Weight is stable.  No SOB or lower extremity edema  Her home BP is ? Usually in the 120s/60s-70s.   Her most recent BUN/Creat has increased to 66/ 6.69 mg/dl on 87/84/7974. K+ was 5.3.  She is not yet willing to start dialysis, and is being followed by the transplant service at [facility]. She had deen declined by the transplant service at [facility].    Review of Systems A complete ROS was performed with pertinent positives/negatives noted in the HPI. The remainder of the ROS are negative.    Past History:    Active Ambulatory Problems    Diagnosis Date Noted   Referred otalgia of right ear 02/04/2019   Arthralgia of right temporomandibular joint 02/04/2019   Type 2 diabetes mellitus with stage 4 chronic kidney disease, with long-term current use of insulin     (CMD) 02/28/2022   Benign hypertension with CKD (chronic kidney disease) stage IV    (CMD) 02/28/2022   Chronic kidney disease, stage IV (severe)    (CMD) 02/28/2022   Type 2 diabetes mellitus with stage 5 chronic kidney disease    (CMD) 06/04/2022   Stage 5 chronic kidney disease not on chronic dialysis    (CMD) 06/04/2022   Benign hypertension with CKD (chronic kidney disease) stage V    (CMD) 06/04/2022   Chronic sinusitis 12/04/2022   Post-nasal drainage 12/04/2022   Acid reflux 03/16/2024   GERD (gastroesophageal reflux disease) 12/22/2015   Anxiety 12/07/2009   Bartholin gland cyst 08/05/2023   Benign  neoplasm of colon 05/29/2015   Bilateral lower leg cellulitis 03/31/2014   Cellulitis 04/01/2014   Chest pain 03/30/2014   Chronic hepatitis C virus infection    (CMD) 12/22/2015   Hepatitis C 03/16/2024   Chronic left shoulder pain 12/27/2016   Depression, psychotic    (CMD) 03/09/2010   Schizophrenia (CMD) 10/28/2023   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   Disorder involving thrombocytopenia 01/18/2015   DOE (dyspnea on exertion) 03/31/2014   Edema 03/30/2014   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   Elevated LFTs 03/31/2014   Essential hypertension 03/31/2014   History of colonic polyps 03/16/2024   Hypercholesterolemia 03/16/2024   Hyperlipidemia associated with type 2 diabetes mellitus    (CMD) 12/27/2022   Hypokalemia 03/31/2014   Hyperlipidemia 03/16/2024   Neck pain 11/03/2023   Neutropenia 03/16/2024   Obese 03/16/2024   Peripheral neuropathy 10/04/2019   Stable angina 12/22/2015   Rash and nonspecific skin eruption 02/14/2014   Tubal ectopic pregnancy (CMD) 03/16/2024   Vaginal lesion 08/05/2023   CKD (chronic kidney disease), stage IV    (CMD) 04/06/2019   DM (diabetes mellitus) (CMD) 03/31/2014   Type 2 diabetes mellitus without complications (CMD) 03/16/2024   Type 2 diabetes mellitus (CMD) 06/30/2014   Anemia in stage 5 chronic kidney disease, not on chronic dialysis    (CMD) 08/25/2024   Resolved Ambulatory Problems    Diagnosis Date Noted  No Resolved Ambulatory Problems   Past Medical History:  Diagnosis Date   Chronic kidney disease    Diabetes mellitus (CMD)    Hypertension    Family History  Problem Relation Name Age of Onset   Heart failure Mother     Social History Social History   Tobacco Use   Smoking status: Former   Smokeless tobacco: Never  [employer]   Vaping status: Never Used  Substance Use Topics   Alcohol use: Not Currently   Drug use: Never   Social History   Social History Narrative    Not on file     Medications:   Meds Ordered in [facility]  Medication Sig Dispense Refill   Accu-Chek Aviva Plus test strp test strip USE TO TEST BLOOD SUGAR TID     Accu-Chek Guide Glucose Meter misc TEST 4 TIMES DAILY WITH MEALS AND AT BEDTIME     Accu-Chek Softclix Lancets misc USE AS DIRECTED     amLODIPine  (NORVASC ) 10 mg tablet Take 10 mg by mouth.     aspirin  81 mg EC tablet Take 81 mg by mouth.     atorvastatin  (LIPITOR) 40 mg tablet      BD Ultra-Fine Short Pen Needle 31 gauge x 5/16 ndle USE TWICE DAILY     benztropine  (COGENTIN ) 1 mg tablet Take by mouth.     calcitrioL  (ROCALTROL ) 0.25 mcg capsule Take 0.25 mcg by mouth Once Daily.     cetirizine  (ZyrTEC ) 10 mg tablet TAKE 1 TABLET BY MOUTH ONCE DAILY     cholecalciferol (VITAMIN D3) 125 mcg (5,000 unit) capsule Take 1 capsule by mouth daily.     cloNIDine  (CATAPRES ) 0.1 mg tablet TAKE 1 TABLET(0.1 MG) BY MOUTH TWICE DAILY 180 tablet 1   cyanocobalamin (VITAMIN B12) 1,000 mcg tablet Take 1,000 mcg by mouth.     diphenhydrAMINE  (BENADRYL ) 25 mg tablet Take 25 mg by mouth.     FLUoxetine  (PROzac ) 20 mg capsule Take 20 mg by mouth.     gabapentin  (NEURONTIN ) 100 mg capsule TAKE ONE CAPSULE BY MOUTH EVERY NIGHT AT BEDTIME AS NEEDED     insulin  lispro (HumaLOG ) 100 unit/mL injection Inject 15 units 3 times a day by subcutaneous route before meals.     Lantus  Solostar U-100 Insulin  100 unit/mL (3 mL) pen INJECT 40 UNITS [place] AT 10PM     loratadine  (CLARITIN ) 10 mg tablet      pantoprazole  (PROTONIX ) 40 mg EC tablet TAKE 1 TABLET BY MOUTH ONCE DAILY     valsartan -hydroCHLOROthiazide  (DIOVAN  HCT) 160-25 mg per tablet Once Daily.     No current Epic-ordered facility-administered medications on file.      Physical Exam:   Vitals:   08/25/24 0827  BP: 123/73  Patient Position: Sitting  Pulse: 54  Weight: 63.5 kg (140 lb)     General appearance: alert, appears stated age, and cooperative  Head:  Normocephalic, without obvious abnormality, atraumatic ENT: Normal. No mucosal discharge. Lungs: CTA bilaterally. No rrales or wheeze. Heart: regular rate and rhythm, S1, S2 normal, no murmur, click, rub or gallop Abdomen: soft, non-tender; bowel sounds normal; no masses,  no organomegaly Extremities:  No cyanosis. No lower extremity edema. Neurologic: Alert and oriented X 3, normal strength and tone. Normal symmetric reflexes. Normal coordination and gait Psych: Normal mood and affect.   Admission on 05/15/2024, Discharged on 05/16/2024  Component Date Value Ref Range Status   Sodium 05/15/2024 135 (L)  136 - 145 mmol/L Final  Potassium 05/15/2024 4.2  3.4 - 4.5 mmol/L Final   Chloride 05/15/2024 104  98 - 107 mmol/L Final   CO2 05/15/2024 26  21 - 31 mmol/L Final   Anion Gap 05/15/2024 5 (L)  6 - 14 mmol/L Final   Glucose, Random 05/15/2024 71  70 - 99 mg/dL Final   Blood Urea Nitrogen (BUN) 05/15/2024 35 (H)  7 - 25 mg/dL Final   Creatinine 90/72/7974 5.68 (H)  0.60 - 1.20 mg/dL Final   eGFR 90/72/7974 8 (L)  >59 mL/min/1.78m2 Final   GFR estimated by CKD-EPI equations(NKF 2021).   Recommend confirmation of Cr-based eGFR by using Cys-based eGFR and other filtration markers (if applicable) in complex cases and clinical decision-making, as needed.   Albumin 05/15/2024 4.1  3.5 - 5.7 g/dL Final   Total Protein 90/72/7974 6.8  6.4 - 8.9 g/dL Final   Bilirubin, Total 05/15/2024 0.4  0.3 - 1.0 mg/dL Final   Alkaline Phosphatase (ALP) 05/15/2024 88  34 - 104 U/L Final   Aspartate Aminotransferase (AST) 05/15/2024 31  13 - 39 U/L Final   Alanine Aminotransferase (ALT) 05/15/2024 23  7 - 52 U/L Final   Calcium  05/15/2024 8.7  8.6 - 10.3 mg/dL Final   BUN/Creatinine Ratio 05/15/2024 6.2 (L)  10.0 - 20.0 Final   Potential intrarenal damage   Color, Urine 05/15/2024 Colorless  Yellow Final   Clarity, Urine 05/15/2024 Clear  Clear Final   Specific Gravity, Urine  05/15/2024 1.006  1.005 - 1.025 Final   pH, Urine 05/15/2024 6.0  5.0 - 8.0 Final   Protein, Urine 05/15/2024 50 (A)  Negative, 10 , 20  mg/dL Final   Glucose, Urine 05/15/2024 Negative  Negative, 30 , 50  mg/dL Final   Ketones, Urine 05/15/2024 Negative  Negative, Trace mg/dL Final   Bilirubin, Urine 05/15/2024 Negative  Negative Final   Blood, Urine 05/15/2024 Trace  Negative, Trace Final   Nitrite, Urine 05/15/2024 Negative  Negative Final   Leukocyte Esterase, Urine 05/15/2024 Negative  Negative, 25 Final   Urobilinogen, Urine 05/15/2024 Normal  <2.0 mg/dL Final   WBC, Urine 90/72/7974 0-5  <6 /HPF Final   RBC, Urine 05/15/2024 3-5 (A)  0 - 2 /HPF Final   Bacteria, Urine 05/15/2024 Rare  None Seen, Rare /HPF Final   Squamous Epithelial Cells, Urine 05/15/2024 0-5  0 - 5 /HPF Final   WBC 05/15/2024 4.63  4.40 - 11.00 10*3/uL Final   RBC 05/15/2024 2.87 (L)  4.10 - 5.10 10*6/uL Final   Hemoglobin 05/15/2024 9.1 (L)  12.3 - 15.3 g/dL Final   Hematocrit 90/72/7974 26.1 (L)  35.9 - 44.6 % Final   Mean Corpuscular Volume (MCV) 05/15/2024 90.9  80.0 - 96.0 fL Final   Mean Corpuscular Hemoglobin (MCH) 05/15/2024 31.6  27.5 - 33.2 pg Final   Mean Corpuscular Hemoglobin Conc (* 05/15/2024 34.7  33.0 - 37.0 g/dL Final   Red Cell Distribution Width (RDW) 05/15/2024 13.4  12.3 - 17.0 % Final   Platelet Count (PLT) 05/15/2024 190  150 - 450 10*3/uL Final   Mean Platelet Volume (MPV) 05/15/2024 6.9  6.8 - 10.2 fL Final   Neutrophils % 05/15/2024 60  % Final   Lymphocytes % 05/15/2024 28  % Final   Monocytes % 05/15/2024 10  % Final   Eosinophils % 05/15/2024 2  % Final   Basophils % 05/15/2024 1  % Final   Neutrophils Absolute 05/15/2024 2.80  1.80 - 7.80 10*3/uL Final   Lymphocytes #  05/15/2024 1.30  1.00 - 4.80 10*3/uL Final   Monocytes # 05/15/2024 0.50  0.00 - 0.80 10*3/uL Final   Eosinophils # 05/15/2024 0.10  0.00 - 0.50 10*3/uL Final   Basophils #  05/15/2024 0.00  0.00 - 0.20 10*3/uL Final   Urine Culture 05/15/2024 No uropathogens isolated.   Final      Assessment.   Kayla Gregory is a 64 year old woman with HTN, HLD, type 2 DM, stage V CKD from diabetic nephropathy and recent discitis s/p treatment who presents for follow up.  Her BP is controlled and she does not have any overt uremic symptoms.  She is being evaluated by the transplant service and does not want to be referred for AVF assessment and placement.  I will continue her present regimen and have advised her to call or go to the ED if she begins to feel unwell.    1. Type 2 diabetes mellitus with stage 5 chronic kidney disease    (CMD)      2. Stage 5 chronic kidney disease not on chronic dialysis    (CMD)  CBC without Differential   Parathyroid Hormone (PTH), Intact   Renal Function Panel   Vitamin D, 25-Hydroxy    3. Benign hypertension with CKD (chronic kidney disease) stage V (HCC)      4. Anemia in stage 5 chronic kidney disease, not on chronic dialysis    (CMD)           Plan.  1) Continue present regimen. 2) Low salt diet, exercise and weight loss. 3) Increase water  intake. 4) Monitor and record home BP. 5) Advised to call or go to the ED if she begins to feel unwell. 6) Repeat labs before next visit.    Return in about 4 months (around 12/23/2024).   Eulas Manes  MD, [facility] 08/25/2024 5:23 PM       "

## 2024-09-01 ENCOUNTER — Ambulatory Visit: Admitting: Student

## 2024-09-06 ENCOUNTER — Telehealth: Payer: Self-pay | Admitting: Student

## 2024-09-06 NOTE — Telephone Encounter (Signed)
 Pt dropped off form at front desk for Kidney paperwork.  Verified that patient section of form has been completed.  Last DOS/WCC with PCP was 08/24/2024.  Placed form in [place] team folder to be completed by clinical staff.  Marjorie DELENA Kiang

## 2024-09-06 NOTE — Telephone Encounter (Signed)
 Reviewed form. Placed in PCP's box to be completed.  Christ Courier, CMA

## 2024-09-07 ENCOUNTER — Encounter (INDEPENDENT_AMBULATORY_CARE_PROVIDER_SITE_OTHER): Admitting: Student

## 2024-09-07 NOTE — Telephone Encounter (Signed)
 Form completed at office visit on 09/07/24  Damien Pinal, DO [facility], PGY-3 09/07/24 10:51 AM

## 2024-09-07 NOTE — Progress Notes (Signed)
" ° °  Patient presented for form to be filled out for transplant. No PCP signature required. Record of her immunizations and preventative screenings provided.   Damien Pinal, DO [facility]  "

## 2024-09-08 ENCOUNTER — Other Ambulatory Visit: Payer: Self-pay | Admitting: Student

## 2024-09-16 ENCOUNTER — Other Ambulatory Visit: Payer: Self-pay | Admitting: Student

## 2024-09-16 ENCOUNTER — Ambulatory Visit

## 2024-09-16 VITALS — Ht 61.0 in | Wt 140.0 lb

## 2024-09-16 DIAGNOSIS — G6289 Other specified polyneuropathies: Secondary | ICD-10-CM

## 2024-09-16 DIAGNOSIS — Z0000 Encounter for general adult medical examination without abnormal findings: Secondary | ICD-10-CM | POA: Diagnosis not present

## 2024-09-16 NOTE — Patient Instructions (Signed)
 Kayla Gregory,  Thank you for taking the time for your Medicare Wellness Visit. I appreciate your continued commitment to your health goals. Please review the care plan we discussed, and feel free to reach out if I can assist you further.  Please note that Annual Wellness Visits do not include a physical exam. Some assessments may be limited, especially if the visit was conducted virtually. If needed, we may recommend an in-person follow-up with your provider.  Ongoing Care Seeing your primary care provider every 3 to 6 months helps us  monitor your health and provide consistent, personalized care.   Referrals If a referral was made during today's visit and you haven't received any updates within two weeks, please contact the referred provider directly to check on the status.  Recommended Screenings:  Health Maintenance  Topic Date Due   Medicare Annual Wellness Visit  Never done   Pneumococcal Vaccine for age over 78 (1 of 2 - PCV) Never done   Zoster (Shingles) Vaccine (2 of 2) 04/18/2022   Eye exam for diabetics  06/24/2024   Hemoglobin A1C  10/31/2024   Complete foot exam   02/01/2025   DTaP/Tdap/Td vaccine (3 - Td or Tdap) 12/27/2025   Breast Cancer Screening  06/11/2026   Colon Cancer Screening  03/30/2028   Flu Shot  Completed   Osteoporosis screening with Bone Density Scan  Completed   Hepatitis C Screening  Completed   Meningitis B Vaccine  Aged Out   COVID-19 Vaccine  Discontinued       09/16/2024    2:14 PM  Advanced Directives  Does Patient Have a Medical Advance Directive? No  Would patient like information on creating a medical advance directive? No - Patient declined    Vision: Annual vision screenings are recommended for early detection of glaucoma, cataracts, and diabetic retinopathy. These exams can also reveal signs of chronic conditions such as diabetes and high blood pressure.  Dental: Annual dental screenings help detect early signs of oral cancer, gum  disease, and other conditions linked to overall health, including heart disease and diabetes.  Please see the attached documents for additional preventive care recommendations.

## 2024-09-16 NOTE — Progress Notes (Signed)
 "  Chief Complaint  Patient presents with   Medicare Wellness    INITIAL     Subjective:   Kayla Gregory is a 64 y.o. female who presents for a Medicare Annual Wellness Visit.  Visit info / Clinical Intake: Medicare Wellness Visit Type:: Initial Annual Wellness Visit Persons participating in visit and providing information:: patient Medicare Wellness Visit Mode:: Telephone If telephone:: video declined Since this visit was completed virtually, some vitals may be partially provided or unavailable. Missing vitals are due to the limitations of the virtual format.: Documented vitals are patient reported If Telephone or Video please confirm:: I connected with patient using audio/video enable telemedicine. I verified patient identity with two identifiers, discussed telehealth limitations, and patient agreed to proceed. Patient Location:: HOME Provider Location:: HOME OFFICE Interpreter Needed?: No Pre-visit prep was completed: yes AWV questionnaire completed by patient prior to visit?: no Living arrangements:: (!) lives alone Patient's Overall Health Status Rating: good Typical amount of pain: none Does pain affect daily life?: no Are you currently prescribed opioids?: no  Dietary Habits and Nutritional Risks How many meals a day?: 2 Eats fruit and vegetables daily?: yes Most meals are obtained by: preparing own meals Diabetic:: (!) yes Any non-healing wounds?: no How often do you check your BS?: 1 Would you like to be referred to a Nutritionist or for Diabetic Management? : no  Functional Status Activities of Daily Living (to include ambulation/medication): Independent Ambulation: Independent Medication Administration: Independent Home Management (perform basic housework or laundry): Independent Manage your own finances?: yes Primary transportation is: driving Concerns about vision?: no *vision screening is required for [facility]* (Dr. Selinda Slocumb) Concerns about hearing?:  no  Fall Screening Falls in the past year?: 0 Number of falls in past year: 0 Was there an injury with Fall?: 0 Fall Risk Category Calculator: 0 Patient Fall Risk Level: Low Fall Risk  Fall Risk Patient at Risk for Falls Due to: No Fall Risks Fall risk Follow up: Falls evaluation completed; Education provided  Home and Transportation Safety: All rugs have non-skid backing?: N/A, no rugs All stairs or steps have railings?: N/A, no stairs Grab bars in the bathtub or shower?: (!) no Have non-skid surface in bathtub or shower?: (!) no Good home lighting?: yes Regular seat belt use?: yes Hospital stays in the last year:: (!) yes How many hospital stays:: 1 Reason: ESRD  Cognitive Assessment Difficulty concentrating, remembering, or making decisions? : no Will 6CIT or Mini Cog be Completed: yes What year is it?: 0 points What month is it?: 0 points Give patient an address phrase to remember (5 components): [address] [employer] About what time is it?: 0 points Count backwards from 20 to 1: 0 points Say the months of the year in reverse: 0 points Repeat the address phrase from earlier: 0 points 6 CIT Score: 0 points  Advance Directives (For Healthcare) Does Patient Have a Medical Advance Directive?: No Would patient like information on creating a medical advance directive?: No - Patient declined  Reviewed/Updated  Reviewed/Updated: Reviewed All (Medical, Surgical, Family, Medications, Allergies, Care Teams, Patient Goals)    Allergies (verified) Metformin  and related, Norvasc  [amlodipine ], and Omeprazole    Current Medications (verified) Outpatient Encounter Medications as of 09/16/2024  Medication Sig   Accu-Chek Softclix Lancets lancets Use as instructed   acetaminophen  (TYLENOL ) 500 MG tablet Take 2 tablets (1,000 mg total) by mouth every 6 (six) hours.   aspirin  EC 81 MG tablet Take 81 mg by mouth  daily. Swallow whole.   atorvastatin  (LIPITOR) 40 MG tablet TAKE  1 TABLET(40 MG) BY MOUTH DAILY   benztropine  (COGENTIN ) 1 MG tablet Take by mouth 2 (two) times daily. Per pt take 4 tabs in am and 1 tab at bedtime   Blood Glucose Monitoring Suppl (ACCU-CHEK AVIVA PLUS) w/Device KIT 1 application  by Does not apply route 4 (four) times daily -  with meals and at bedtime.   Blood Pressure Monitoring (BLOOD PRESSURE CUFF) MISC 1 each by Does not apply route daily.   carboxymethylcellulose (REFRESH PLUS) 0.5 % SOLN Place 1 drop into both eyes 3 (three) times daily as needed.   cloNIDine  (CATAPRES ) 0.1 MG tablet Take 0.1 mg by mouth 2 (two) times daily.   cyclobenzaprine  (FLEXERIL ) 10 MG tablet Take 1 tablet (10 mg total) by mouth at bedtime.   diclofenac  Sodium (VOLTAREN  ARTHRITIS PAIN) 1 % GEL Apply 2 g topically 4 (four) times daily.   diphenhydrAMINE  (BENADRYL ) 25 MG tablet Take 25 mg by mouth every 4 (four) hours as needed.   FLUoxetine  (PROZAC ) 10 MG capsule Take 10 mg by mouth daily.   gabapentin  (NEURONTIN ) 100 MG capsule TAKE 1 CAPSULE(100 MG) BY MOUTH AT BEDTIME AS NEEDED   glucose blood (ACCU-CHEK AVIVA PLUS) test strip Use to check blood glucose up to four times daily   insulin  glargine (LANTUS  SOLOSTAR) 100 UNIT/ML Solostar Pen Inject 10 Units into the skin daily.   Insulin  Pen Needle (B-D ULTRAFINE III SHORT PEN) 31G X 8 MM MISC USE DAILY AS DIRECTED   Lancet Device MISC Check blood glucose once a day in the AM.   Lancet Devices (ACCU-CHEK SOFTCLIX) lancets Use as instructed   meloxicam  (MOBIC ) 15 MG tablet TAKE 1 TABLET(15 MG) BY MOUTH DAILY   pantoprazole  (PROTONIX ) 40 MG tablet TAKE 1 TABLET(40 MG) BY MOUTH DAILY   polycarbophil (FIBERCON) 625 MG tablet Take 1 tablet (625 mg total) by mouth daily.   polyethylene glycol (MIRALAX ) 17 g packet Take 17 g by mouth daily.   senna-docusate (SENOKOT-S) 8.6-50 MG tablet Take 1 tablet by mouth at bedtime as needed for mild constipation or moderate constipation.   traZODone (DESYREL) 50 MG tablet Take 50 mg  by mouth at bedtime.   triamcinolone  cream (KENALOG ) 0.1 % Apply 1 Application topically 2 (two) times daily.   valsartan -hydrochlorothiazide  (DIOVAN -HCT) 160-25 MG tablet TAKE 1 TABLET BY MOUTH DAILY   ziprasidone  (GEODON ) 20 MG capsule Take 20 mg by mouth daily with breakfast.   [DISCONTINUED] atorvastatin  (LIPITOR) 40 MG tablet TAKE 1 TABLET(40 MG) BY MOUTH DAILY   [DISCONTINUED] gabapentin  (NEURONTIN ) 100 MG capsule TAKE 1 CAPSULE(100 MG) BY MOUTH AT BEDTIME AS NEEDED   Facility-Administered Encounter Medications as of 09/16/2024  Medication   insulin  starter kit- pen needles (English) 1 kit    History: Past Medical History:  Diagnosis Date   Chronic kidney disease    GERD (gastroesophageal reflux disease)    Hyperlipidemia associated with type 2 diabetes mellitus (HCC)    Hypertension    Liver disease    Schizophrenia (HCC)    Past Surgical History:  Procedure Laterality Date   BUNIONECTOMY Left 12/12/2022   Procedure: KELLER BUNION IMPLANT;  Surgeon: Janit Thresa HERO, DPM;  Location: [facility];  Service: Podiatry;  Laterality: Left;   CAPSULOTOMY METATARSOPHALANGEAL Left 12/12/2022   Procedure: CAPSULOTOMY METATARSOPHALANGEAL SECOND;  Surgeon: Janit Thresa HERO, DPM;  Location: [facility];  Service: Podiatry;  Laterality: Left;   CATARACT EXTRACTION  W/ INTRAOCULAR LENS  IMPLANT, BILATERAL Bilateral    approx 2020   COLONOSCOPY  06/2020   ECTOPIC PREGNANCY SURGERY     1980s   FOOT SURGERY Bilateral 03/05/2017   @ [facility] by dr b. janit;   right 1st bunionectomy/  right 2nd hammertoe correction;  left and right 5th hammer toe correction   HAMMER TOE SURGERY Left 12/12/2022   Procedure: HAMMER TOE CORRECTION SECOND;  Surgeon: Janit Thresa HERO, DPM;  Location: [facility];  Service: Podiatry;  Laterality: Left;   IR THORACIC DISC ASPIRATION W/IMG GUIDE  04/25/2024   PTOSIS REPAIR Left 12/25/2017   Procedure: INTERNAL PTOSIS REPAIR LEFT EYE;   Surgeon: Laurie Loyd Redhead, MD;  Location: [facility];  Service: Ophthalmology;  Laterality: Left;   TONSILLECTOMY     age 57   TOTAL HIP ARTHROPLASTY Left 2011   Family History  Problem Relation Age of Onset   Heart attack Sister 40   Heart attack Brother 34   Heart attack Mother 69   Social History   Occupational History   Not on file  Tobacco Use   Smoking status: Former    Current packs/day: 0.00    Types: Cigarettes    Start date: 1974    Quit date: 2001    Years since quitting: 25.0    Passive exposure: Past   Smokeless tobacco: Never  Vaping Use   Vaping status: Never Used  Substance and Sexual Activity   Alcohol use: No    Comment: per pt quit 2001   Drug use: Not Currently    Comment: 12-04-2022  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   Sexual activity: Not Currently   Tobacco Counseling Counseling given: Not Answered  SDOH Screenings   Food Insecurity: No Food Insecurity (09/16/2024)  Housing: Low Risk (09/16/2024)  Transportation Needs: No Transportation Needs (09/16/2024)  Utilities: Not At Risk (09/16/2024)  Alcohol Screen: Low Risk (09/16/2024)  Depression (PHQ2-9): Low Risk (09/16/2024)  Financial Resource Strain: Low Risk (09/16/2024)  Physical Activity: Sufficiently Active (09/16/2024)  Social Connections: Moderately Isolated (09/16/2024)  Stress: No Stress Concern Present (09/16/2024)  Tobacco Use: Medium Risk (09/16/2024)  Health Literacy: Adequate Health Literacy (09/16/2024)   See flowsheets for full screening details  Depression Screen PHQ 2 & 9 Depression Scale- Over the past 2 weeks, how often have you been bothered by any of the following problems? Little interest or pleasure in doing things: 0 Feeling down, depressed, or hopeless (PHQ Adolescent also includes...irritable): 0 PHQ-2 Total Score: 0 Trouble falling or staying asleep, or sleeping too much: 0 Feeling tired or having little energy: 0 Poor appetite or overeating (PHQ  Adolescent also includes...weight loss): 0 Feeling bad about yourself - or that you are a failure or have let yourself or your family down: 0 Trouble concentrating on things, such as reading the newspaper or watching television (PHQ Adolescent also includes...like school work): 0 Moving or speaking so slowly that other people could have noticed. Or the opposite - being so fidgety or restless that you have been moving around a lot more than usual: 0 Thoughts that you would be better off dead, or of hurting yourself in some way: 0 PHQ-9 Total Score: 0 If you checked off any problems, how difficult have these problems made it for you to do your work, take care of things at home, or get along with other people?: Not difficult at all     Goals Addressed  This Visit's Progress    09/16/2024: TO GET MY LICENSE.               Objective:    Today's Vitals   09/16/24 1413  Weight: 140 lb (63.5 kg)  Height: 5' 1 (1.549 m)  PainSc: 0-No pain   Body mass index is 26.45 kg/m.  Hearing/Vision screen No results found. Immunizations and Health Maintenance Health Maintenance  Topic Date Due   Pneumococcal Vaccine: 50+ Years (1 of 2 - PCV) Never done   OPHTHALMOLOGY EXAM  06/24/2024   HEMOGLOBIN A1C  10/31/2024   FOOT EXAM  02/01/2025   Medicare Annual Wellness (AWV)  09/16/2025   DTaP/Tdap/Td (3 - Td or Tdap) 12/27/2025   Mammogram  06/11/2026   Colonoscopy  03/30/2028   Influenza Vaccine  Completed   Bone Density Scan  Completed   Hepatitis C Screening  Completed   Zoster Vaccines- Shingrix  Completed   Meningococcal B Vaccine  Aged Out   COVID-19 Vaccine  Discontinued        Assessment/Plan:  This is a routine wellness examination for Kayla Gregory.  Patient Care Team: Cleotilde Perkins, DO as PCP - General (Family Medicine) Jarold Mayo, MD as Consulting Physician (Ophthalmology)  I have personally reviewed and noted the following in the patients chart:   Medical  and social history Use of alcohol, tobacco or illicit drugs  Current medications and supplements including opioid prescriptions. Functional ability and status Nutritional status Physical activity Advanced directives List of other physicians Hospitalizations, surgeries, and ER visits in previous 12 months Vitals Screenings to include cognitive, depression, and falls Referrals and appointments  No orders of the defined types were placed in this encounter.  In addition, I have reviewed and discussed with patient certain preventive protocols, quality metrics, and best practice recommendations. A written personalized care plan for preventive services as well as general preventive health recommendations were provided to patient.   Kayla LOISE Fuller, LPN   [id number]/70/7973   Return in about 1 year (around 09/16/2025) for Medicare wellness.  After Visit Summary: (MyChart) Due to this being a telephonic visit, the after visit summary with patients personalized plan was offered to patient via MyChart   Nurse Notes:  No voiced or noted concerns at this time HM Addressed: Vaccines Due: Pneumococcal Patient is current and up to date with office visits with PCP.  Nurse will request notes from [facility] regarding Diabetic Eye Exam.  "

## 2024-11-24 ENCOUNTER — Ambulatory Visit: Payer: Self-pay | Admitting: Student
